# Patient Record
Sex: Male | Born: 1950
Health system: Southern US, Community
[De-identification: ages and names within clinical notes are randomized; demographics above are authoritative.]

## PROBLEM LIST (undated history)

## (undated) DIAGNOSIS — I2510 Atherosclerotic heart disease of native coronary artery without angina pectoris: Secondary | ICD-10-CM

## (undated) DIAGNOSIS — J309 Allergic rhinitis, unspecified: Secondary | ICD-10-CM

## (undated) DIAGNOSIS — I219 Acute myocardial infarction, unspecified: Secondary | ICD-10-CM

## (undated) DIAGNOSIS — K219 Gastro-esophageal reflux disease without esophagitis: Secondary | ICD-10-CM

## (undated) DIAGNOSIS — N529 Male erectile dysfunction, unspecified: Secondary | ICD-10-CM

## (undated) DIAGNOSIS — J45909 Unspecified asthma, uncomplicated: Secondary | ICD-10-CM

## (undated) DIAGNOSIS — K635 Polyp of colon: Secondary | ICD-10-CM

## (undated) DIAGNOSIS — J302 Other seasonal allergic rhinitis: Secondary | ICD-10-CM

## (undated) DIAGNOSIS — N189 Chronic kidney disease, unspecified: Secondary | ICD-10-CM

## (undated) DIAGNOSIS — J189 Pneumonia, unspecified organism: Secondary | ICD-10-CM

## (undated) DIAGNOSIS — I823 Embolism and thrombosis of renal vein: Secondary | ICD-10-CM

## (undated) DIAGNOSIS — E119 Type 2 diabetes mellitus without complications: Secondary | ICD-10-CM

## (undated) DIAGNOSIS — M1990 Unspecified osteoarthritis, unspecified site: Secondary | ICD-10-CM

## (undated) DIAGNOSIS — I10 Essential (primary) hypertension: Secondary | ICD-10-CM

## (undated) DIAGNOSIS — E785 Hyperlipidemia, unspecified: Secondary | ICD-10-CM

## (undated) HISTORY — DX: Hyperlipidemia, unspecified: E78.5

## (undated) HISTORY — PX: CATARACT EXTRACTION: SUR2

## (undated) HISTORY — PX: CORONARY ANGIOPLASTY WITH STENT PLACEMENT: SHX49

## (undated) HISTORY — DX: Male erectile dysfunction, unspecified: N52.9

## (undated) HISTORY — DX: Chronic kidney disease, unspecified: N18.9

## (undated) HISTORY — DX: Unspecified asthma, uncomplicated: J45.909

## (undated) HISTORY — DX: Essential (primary) hypertension: I10

## (undated) HISTORY — DX: Polyp of colon: K63.5

## (undated) HISTORY — DX: Atherosclerotic heart disease of native coronary artery without angina pectoris: I25.10

## (undated) HISTORY — DX: Type 2 diabetes mellitus without complications: E11.9

---

## 2007-05-20 ENCOUNTER — Encounter: Payer: Self-pay | Admitting: Family Medicine

## 2008-06-01 ENCOUNTER — Ambulatory Visit: Payer: Self-pay | Admitting: Family Medicine

## 2008-06-01 LAB — CONVERTED CEMR LAB
Bilirubin Urine: NEGATIVE
Glucose, Urine, Semiquant: NEGATIVE
Ketones, urine, test strip: NEGATIVE
Specific Gravity, Urine: 1.025
Urobilinogen, UA: 0.2
pH: 5.5

## 2008-06-05 LAB — CONVERTED CEMR LAB
AST: 26 units/L (ref 0–37)
Albumin: 3.8 g/dL (ref 3.5–5.2)
Alkaline Phosphatase: 51 units/L (ref 39–117)
Basophils Absolute: 0 10*3/uL (ref 0.0–0.1)
Basophils Relative: 0.7 % (ref 0.0–3.0)
Cholesterol: 130 mg/dL (ref 0–200)
Creatinine,U: 87.2 mg/dL
Eosinophils Absolute: 0.1 10*3/uL (ref 0.0–0.7)
GFR calc non Af Amer: 122.66 mL/min (ref >60)
HCT: 42.7 % (ref 39.0–52.0)
Hgb A1c MFr Bld: 8.2 % — ABNORMAL HIGH (ref 4.6–6.5)
LDL Cholesterol: 73 mg/dL (ref 0–99)
Lymphocytes Relative: 36.7 % (ref 12.0–46.0)
Lymphs Abs: 2.3 10*3/uL (ref 0.7–4.0)
MCHC: 34.8 g/dL (ref 30.0–36.0)
Microalb, Ur: 0.9 mg/dL (ref 0.0–1.9)
Monocytes Absolute: 0.6 10*3/uL (ref 0.1–1.0)
Neutro Abs: 3.3 10*3/uL (ref 1.4–7.7)
Neutrophils Relative %: 50.3 % (ref 43.0–77.0)
PSA: 1.12 ng/mL (ref 0.10–4.00)
Platelets: 182 10*3/uL (ref 150.0–400.0)
RDW: 12.4 % (ref 11.5–14.6)
Sodium: 142 meq/L (ref 135–145)
TSH: 0.95 microintl units/mL (ref 0.35–5.50)
Total CHOL/HDL Ratio: 4
Triglycerides: 111 mg/dL (ref 0.0–149.0)
VLDL: 22.2 mg/dL (ref 0.0–40.0)

## 2008-06-08 ENCOUNTER — Ambulatory Visit: Payer: Self-pay | Admitting: Family Medicine

## 2008-06-08 DIAGNOSIS — J45909 Unspecified asthma, uncomplicated: Secondary | ICD-10-CM

## 2008-06-08 DIAGNOSIS — E119 Type 2 diabetes mellitus without complications: Secondary | ICD-10-CM

## 2008-06-08 DIAGNOSIS — E785 Hyperlipidemia, unspecified: Secondary | ICD-10-CM

## 2008-06-08 DIAGNOSIS — I10 Essential (primary) hypertension: Secondary | ICD-10-CM

## 2008-06-08 HISTORY — DX: Essential (primary) hypertension: I10

## 2008-06-08 HISTORY — DX: Hyperlipidemia, unspecified: E78.5

## 2008-06-08 HISTORY — DX: Unspecified asthma, uncomplicated: J45.909

## 2008-06-08 HISTORY — DX: Type 2 diabetes mellitus without complications: E11.9

## 2008-07-05 ENCOUNTER — Ambulatory Visit: Payer: Self-pay | Admitting: Gastroenterology

## 2008-07-09 ENCOUNTER — Encounter: Payer: Self-pay | Admitting: Family Medicine

## 2008-08-21 ENCOUNTER — Ambulatory Visit: Payer: Self-pay | Admitting: Gastroenterology

## 2008-08-21 ENCOUNTER — Encounter: Payer: Self-pay | Admitting: Gastroenterology

## 2008-08-23 ENCOUNTER — Ambulatory Visit (HOSPITAL_COMMUNITY): Admission: RE | Admit: 2008-08-23 | Discharge: 2008-08-23 | Payer: Self-pay | Admitting: Internal Medicine

## 2008-08-23 ENCOUNTER — Telehealth: Payer: Self-pay | Admitting: Gastroenterology

## 2008-08-23 ENCOUNTER — Ambulatory Visit: Payer: Self-pay | Admitting: Internal Medicine

## 2008-08-23 DIAGNOSIS — R5381 Other malaise: Secondary | ICD-10-CM

## 2008-08-23 DIAGNOSIS — R634 Abnormal weight loss: Secondary | ICD-10-CM | POA: Insufficient documentation

## 2008-08-23 DIAGNOSIS — R509 Fever, unspecified: Secondary | ICD-10-CM

## 2008-08-23 DIAGNOSIS — Z860100 Personal history of colon polyps, unspecified: Secondary | ICD-10-CM | POA: Insufficient documentation

## 2008-08-23 DIAGNOSIS — R5383 Other fatigue: Secondary | ICD-10-CM | POA: Insufficient documentation

## 2008-08-23 LAB — CONVERTED CEMR LAB
AST: 24 units/L (ref 0–37)
Alkaline Phosphatase: 66 units/L (ref 39–117)
Bilirubin Urine: NEGATIVE
Calcium: 8.8 mg/dL (ref 8.4–10.5)
Eosinophils Absolute: 0.1 10*3/uL (ref 0.0–0.7)
Eosinophils Relative: 1.2 % (ref 0.0–5.0)
GFR calc non Af Amer: 105.06 mL/min (ref >60)
HCT: 40.1 % (ref 39.0–52.0)
Hemoglobin: 13.8 g/dL (ref 13.0–17.0)
Leukocytes, UA: NEGATIVE
Lymphocytes Relative: 25 % (ref 12.0–46.0)
MCV: 87.4 fL (ref 78.0–100.0)
Monocytes Absolute: 1.3 10*3/uL — ABNORMAL HIGH (ref 0.1–1.0)
Monocytes Relative: 18.8 % — ABNORMAL HIGH (ref 3.0–12.0)
Neutrophils Relative %: 54.3 % (ref 43.0–77.0)
RBC: 4.59 M/uL (ref 4.22–5.81)
Sodium: 138 meq/L (ref 135–145)
Specific Gravity, Urine: >=1.03 (ref 1.000–1.030)
Total Bilirubin: 0.7 mg/dL (ref 0.3–1.2)
Urine Glucose: 500 mg/dL
WBC: 6.8 10*3/uL (ref 4.5–10.5)
pH: 5.5 (ref 5.0–8.0)

## 2008-08-24 ENCOUNTER — Ambulatory Visit: Payer: Self-pay | Admitting: Family Medicine

## 2008-08-24 DIAGNOSIS — J189 Pneumonia, unspecified organism: Secondary | ICD-10-CM

## 2008-08-24 DIAGNOSIS — N529 Male erectile dysfunction, unspecified: Secondary | ICD-10-CM | POA: Insufficient documentation

## 2008-08-24 HISTORY — DX: Male erectile dysfunction, unspecified: N52.9

## 2008-08-25 ENCOUNTER — Encounter: Payer: Self-pay | Admitting: Gastroenterology

## 2008-08-27 ENCOUNTER — Ambulatory Visit: Payer: Self-pay | Admitting: Family Medicine

## 2008-09-07 ENCOUNTER — Ambulatory Visit (HOSPITAL_COMMUNITY): Admission: RE | Admit: 2008-09-07 | Discharge: 2008-09-07 | Payer: Self-pay | Admitting: Family Medicine

## 2008-09-10 ENCOUNTER — Encounter: Payer: Self-pay | Admitting: Family Medicine

## 2009-03-16 HISTORY — PX: BACK SURGERY: SHX140

## 2009-04-01 ENCOUNTER — Encounter: Payer: Self-pay | Admitting: Family Medicine

## 2009-05-22 ENCOUNTER — Encounter: Admission: RE | Admit: 2009-05-22 | Discharge: 2009-05-22 | Payer: Self-pay | Admitting: Sports Medicine

## 2009-06-10 ENCOUNTER — Ambulatory Visit: Payer: Self-pay | Admitting: Family Medicine

## 2009-06-10 LAB — CONVERTED CEMR LAB
Albumin: 4.2 g/dL (ref 3.5–5.2)
BUN: 14 mg/dL (ref 6–23)
Basophils Absolute: 0 10*3/uL (ref 0.0–0.1)
Basophils Relative: 0.3 % (ref 0.0–3.0)
Calcium: 9.5 mg/dL (ref 8.4–10.5)
Creatinine, Ser: 0.8 mg/dL (ref 0.4–1.5)
Eosinophils Absolute: 0.2 10*3/uL (ref 0.0–0.7)
Eosinophils Relative: 3 % (ref 0.0–5.0)
GFR calc non Af Amer: 104.77 mL/min (ref >60)
Glucose, Bld: 123 mg/dL — ABNORMAL HIGH (ref 70–99)
Glucose, Urine, Semiquant: NEGATIVE
HDL: 43 mg/dL (ref >39.00)
Hgb A1c MFr Bld: 7.1 % — ABNORMAL HIGH (ref 4.6–6.5)
LDL Cholesterol: 52 mg/dL (ref 0–99)
Lymphocytes Relative: 33.5 % (ref 12.0–46.0)
Lymphs Abs: 2 10*3/uL (ref 0.7–4.0)
Microalb Creat Ratio: 14.4 mg/g (ref 0.0–30.0)
Microalb, Ur: 2.9 mg/dL — ABNORMAL HIGH (ref 0.0–1.9)
Neutrophils Relative %: 53.6 % (ref 43.0–77.0)
Nitrite: NEGATIVE
Platelets: 189 10*3/uL (ref 150.0–400.0)
Potassium: 5.3 meq/L — ABNORMAL HIGH (ref 3.5–5.1)
RBC: 4.96 M/uL (ref 4.22–5.81)
Sodium: 141 meq/L (ref 135–145)
Specific Gravity, Urine: >=1.03
TSH: 0.41 microintl units/mL (ref 0.35–5.50)
Total CHOL/HDL Ratio: 3
VLDL: 18.2 mg/dL (ref 0.0–40.0)
WBC Urine, dipstick: NEGATIVE
pH: 5.5

## 2009-06-28 ENCOUNTER — Ambulatory Visit: Payer: Self-pay | Admitting: Family Medicine

## 2009-07-24 ENCOUNTER — Ambulatory Visit: Payer: Self-pay | Admitting: Family Medicine

## 2009-08-01 ENCOUNTER — Encounter (HOSPITAL_BASED_OUTPATIENT_CLINIC_OR_DEPARTMENT_OTHER): Admission: RE | Admit: 2009-08-01 | Discharge: 2009-10-09 | Payer: Self-pay | Admitting: Internal Medicine

## 2009-11-04 ENCOUNTER — Ambulatory Visit: Payer: Self-pay | Admitting: Family Medicine

## 2009-11-04 DIAGNOSIS — J209 Acute bronchitis, unspecified: Secondary | ICD-10-CM

## 2010-04-06 ENCOUNTER — Encounter: Payer: Self-pay | Admitting: Sports Medicine

## 2010-04-15 NOTE — Letter (Signed)
Summary: Joel Williams at [facility] at [facility]   Imported By: Maryln Gottron 04/15/2009 15:22:43  _____________________________________________________________________  External Attachment:    Type:   Image     Comment:   External Document

## 2010-04-15 NOTE — Assessment & Plan Note (Signed)
Summary: CPX // RS/pt rescd//ccm   Vital Signs:  Patient profile:   60 year old male Height:      70.5 inches Weight:      252 pounds BMI:     35.78 Temp:     98.2 degrees F oral Pulse rate:   72 / minute Pulse rhythm:   regular Resp:     12 per minute BP sitting:   130 / 70  (left arm) Cuff size:   large  Vitals Entered By: Sid Falcon LPN (June 28, 2009 9:16 AM)  Nutrition Counseling: Patient's BMI is greater than 25 and therefore counseled on weight management options. CC: CPX   History of Present Illness: Patient here for complete physical examination.  Chronic medical problems include type 2 diabetes, hypertension, hyperlipidemia. Patient sees endocrinologist. On insulin pump. Blood sugars well controlled.  no hyperglycemia.  Recent problems with low back pain. Planned surgery this coming Monday. No cardiac history. Denies any recent chest pains or shortness of breath. Ex smoker. Has had substantial weight gain past few years.  Colonoscopy in 2010. Pneumovax 2007. Tetanus 2007. Next colonoscopy due 2013.  Allergies (verified): No Known Drug Allergies  Past History:  Past Medical History: Last updated: 08/23/2008 Asthma Diabetes mellitus, type II Hypertension Emphysema or chronic Bronchitis  Kidney disease (remote hx ? thrombosis renal vessel) Hyperlipidemia Hemorrhoids Bulging disks-L5 COLON POLYPS/ADENOMATOUS  Past Surgical History: Last updated: 07/05/2008 Unremarkable  Family History: Last updated: 07/05/2008 No FH of Colon Cancer: Family History of Diabetes: Father Family History of Heart Disease: Father  Social History: Last updated: 07/05/2008 Occupation:  Transport planner Married, 2 girls Alcohol use-yes, 4 oz vodka daily,  Former Smoker Quit 5 years ago Daily Caffeine Use 10+cups Illicit Drug Use - no  Risk Factors: Smoking Status: quit (06/08/2008)  Review of Systems       The patient complains of weight gain.  The patient denies  anorexia, fever, weight loss, vision loss, decreased hearing, hoarseness, chest pain, syncope, dyspnea on exertion, peripheral edema, prolonged cough, headaches, hemoptysis, abdominal pain, melena, hematochezia, severe indigestion/heartburn, hematuria, incontinence, genital sores, muscle weakness, suspicious skin lesions, transient blindness, difficulty walking, depression, unusual weight change, abnormal bleeding, enlarged lymph nodes, angioedema, and testicular masses.    Physical Exam  General:  moderately obese in no distress Head:  Normocephalic and atraumatic without obvious abnormalities. No apparent alopecia or balding. Eyes:  pupils equal, pupils round, and pupils reactive to light.   Ears:  External ear exam shows no significant lesions or deformities.  Otoscopic examination reveals clear canals, tympanic membranes are intact bilaterally without bulging, retraction, inflammation or discharge. Hearing is grossly normal bilaterally. Nose:  External nasal examination shows no deformity or inflammation. Nasal mucosa are pink and moist without lesions or exudates. Mouth:  Oral mucosa and oropharynx without lesions or exudates.  Teeth in good repair. Neck:  No deformities, masses, or tenderness noted. Lungs:  Normal respiratory effort, chest expands symmetrically. Lungs are clear to auscultation, no crackles or wheezes. Heart:  Normal rate and regular rhythm. S1 and S2 normal without gallop, murmur, click, rub or other extra sounds. Abdomen:  soft, non-tender, and no masses.   Rectal:  deferred.  pt unable to lean over sec to severe back pain. Extremities:  No clubbing, cyanosis, edema, or deformity noted with normal full range of motion of all joints.   Neurologic:  alert & oriented X3, cranial nerves II-XII intact, strength normal in all extremities, and gait normal.   Skin:  Intact without suspicious lesions or rashes Cervical Nodes:  No lymphadenopathy noted Psych:  normally interactive,  good eye contact, not anxious appearing, and not depressed appearing.     Impression & Recommendations:  Problem # 1:  PHYSICAL EXAMINATION (ICD-V70.0)  labs reviewed with patient. Blood sugars fairly well controlled. Lipids very well controlled. Tetanus and Pneumovax up-to-date. Repeat colonoscopy in 2 years. Patient needs to lose weight and after his back surgery plans to start regular exercise.  Get baseline EKG-NSR and no acute changes.  Orders: EKG w/ Interpretation (93000)  Complete Medication List: 1)  Simvastatin 20 Mg Tabs (Simvastatin) .... Once daily 2)  Aspirin Adult Low Strength 81 Mg Tbec (Aspirin) .... Qd 3)  Metformin Hcl 500 Mg Tabs (Metformin hcl) .... 2 in am, 2 in pm 4)  Cal-day 1000 Tabs (Multiple vitamins-minerals) .... Once daily 5)  Fish Oil Concentrate 1000 Mg Caps (Omega-3 fatty acids) .... Once daily 6)  Cvs Daily Multiple For Men 50+ Tabs (Multiple vitamins-minerals) .... Once daily 7)  Vitamin C 1000 Mg Tabs (Ascorbic acid) .... Take 1 tablet by mouth once a day 8)  Glucosamine 1500 Complex Caps (Glucosamine-chondroit-vit c-mn) .... Once daily.qdtab 9)  Hydrocodone-acetaminophen 5-500 Mg Tabs (Hydrocodone-acetaminophen) .Marland Kitchen.. 1-2 tabs every 4-6 hours as needed pain 10)  Lisinopril 10 Mg Tabs (Lisinopril) .... One by mouth once daily 11)  Bd Ultra-fine 33 Lancets Misc (Lancets) 12)  Bd U/f Short Pen Needle 31g X 8 Mm Misc (Insulin pen needle) .... Bs check two times a day 13)  Cialis 20 Mg Tabs (Tadalafil) .... One by mouth every other day as needed 14)  Airborne  .... As needed 15)  Novolog 100 Unit/ml Soln (Insulin aspart) .... Insulin pump  Patient Instructions: 1)  It is important that you exercise reguarly at least 20 minutes 5 times a week. If you develop chest pain, have severe difficulty breathing, or feel very tired, stop exercising immediately and seek medical attention.  2)  You need to lose weight. Consider a lower calorie diet and regular  exercise.  Prescriptions: CIALIS 20 MG TABS (TADALAFIL) one by mouth every other day as needed  #18 x 3   Entered by:   Sid Falcon LPN   Authorized by:   Evelena Peat MD   Signed by:   Sid Falcon LPN on 16/12/9602   Method used:   Electronically to        [facility]* (mail-order)             ,          Ph: [phone]       Fax: [phone]   RxID:   [id number] LISINOPRIL 10 MG TABS (LISINOPRIL) one by mouth once daily  #90 x 3   Entered by:   Sid Falcon LPN   Authorized by:   Evelena Peat MD   Signed by:   Sid Falcon LPN on 32/44/0102   Method used:   Electronically to        [facility]* (mail-order)             ,          Ph: [phone]       Fax: [phone]   RxID:   [id number] METFORMIN HCL 500 MG TABS (METFORMIN HCL) 2 in am, 2 in pm  #360 x 3   Entered by:   Sid Falcon LPN   Authorized by:   Evelena Peat MD   Signed by:   Harriett Sine  Neckers LPN on 96/06/5407   Method used:   Electronically to        [facility]* (mail-order)             ,          Ph: [phone]       Fax: [phone]   RxID:   [id number] SIMVASTATIN 20 MG TABS (SIMVASTATIN) once daily  #90 x 3   Entered by:   Sid Falcon LPN   Authorized by:   Evelena Peat MD   Signed by:   Sid Falcon LPN on 03/18/7251   Method used:   Electronically to        [facility]* (mail-order)             ,          Ph: [phone]       Fax: [phone]   RxID:   [id number]

## 2010-04-15 NOTE — Assessment & Plan Note (Signed)
Summary: cough/chest congestion/headache/cjr   Vital Signs:  Patient profile:   60 year old male Temp:     98.4 degrees F oral BP sitting:   140 / 80  (left arm) Cuff size:   large  Vitals Entered By: Sid Falcon LPN (Jul 24, 2009 9:50 AM) CC: productive cough, songestion X 3 days   History of Present Illness: Patient seen with acute illness 3 days duration. Symptoms include sore throat, productive cough, malaise, and body aches. No dyspnea. Denies fever or chills. No nausea, vomiting, or diarrhea.  Had low back surgery about 3 weeks ago. Postop follow up with surgeon this past Monday with  operative site infection with some cellulitis. Patient started on Keflex at that time. Back pain is improving. Minimal drainage from wound.  Allergies (verified): No Known Drug Allergies  Past History:  Past Medical History: Last updated: 08/23/2008 Asthma Diabetes mellitus, type II Hypertension Emphysema or chronic Bronchitis  Kidney disease (remote hx ? thrombosis renal vessel) Hyperlipidemia Hemorrhoids Bulging disks-L5 COLON POLYPS/ADENOMATOUS PMH reviewed for relevance  Review of Systems      See HPI  Physical Exam  General:  Well-developed,well-nourished,in no acute distress; alert,appropriate and cooperative throughout examination Ears:  moderate cerumen in both canals otherwise clear Nose:  External nasal examination shows no deformity or inflammation. Nasal mucosa are pink and moist without lesions or exudates. Mouth:  couple small aphthous ulcers otherwise clear Neck:  No deformities, masses, or tenderness noted. Lungs:  Normal respiratory effort, chest expands symmetrically. Lungs are clear to auscultation, no crackles or wheezes. Heart:  Normal rate and regular rhythm. S1 and S2 normal without gallop, murmur, click, rub or other extra sounds. Skin:  low back midline incision site with minimal purulent drainage.  No surrounding cellulitis.   Impression &  Recommendations:  Problem # 1:  VIRAL URI (ICD-465.9) Assessment New Try mucinex and plenty of fluids. His updated medication list for this problem includes:    Aspirin Adult Low Strength 81 Mg Tbec (Aspirin) ..... Qd  Complete Medication List: 1)  Simvastatin 20 Mg Tabs (Simvastatin) .... Once daily 2)  Aspirin Adult Low Strength 81 Mg Tbec (Aspirin) .... Qd 3)  Metformin Hcl 500 Mg Tabs (Metformin hcl) .... 2 in am, 2 in pm 4)  Cal-day 1000 Tabs (Multiple vitamins-minerals) .... Once daily 5)  Fish Oil Concentrate 1000 Mg Caps (Omega-3 fatty acids) .... Once daily 6)  Cvs Daily Multiple For Men 50+ Tabs (Multiple vitamins-minerals) .... Once daily 7)  Vitamin C 1000 Mg Tabs (Ascorbic acid) .... Take 1 tablet by mouth once a day 8)  Glucosamine 1500 Complex Caps (Glucosamine-chondroit-vit c-mn) .... Once daily.qdtab 9)  Hydrocodone-acetaminophen 5-500 Mg Tabs (Hydrocodone-acetaminophen) .Marland Kitchen.. 1-2 tabs every 4-6 hours as needed pain 10)  Lisinopril 10 Mg Tabs (Lisinopril) .... One by mouth once daily 11)  Bd Ultra-fine 33 Lancets Misc (Lancets) 12)  Bd U/f Short Pen Needle 31g X 8 Mm Misc (Insulin pen needle) .... Bs check two times a day 13)  Cialis 20 Mg Tabs (Tadalafil) .... One by mouth every other day as needed 14)  Airborne  .... As needed 15)  Novolog 100 Unit/ml Soln (Insulin aspart) .... Insulin pump  Patient Instructions: 1)  Get plenty of rest, drink lots of clear liquids, and use Tylenol or Ibuprofen for fever and comfort. Return in 7-10 days if you're not better: sooner if you'er feeling worse.  2)  Try Mucinex 2 tablets twice daily to loosen cough. 3)  Follow up  immediately if you note any fevers.

## 2010-04-15 NOTE — Assessment & Plan Note (Signed)
Summary: ? virus//ccm   Vital Signs:  Patient profile:   60 year old male Temp:     98.1 degrees F oral BP sitting:   160 / 90  (left arm) Cuff size:   large  Vitals Entered By: Sid Falcon LPN (November 04, 2009 2:56 PM)  History of Present Illness: Patient seen with one week history of illness. Cough productive of colored mucus. Has felt feverish off and on but no true fever. Has had increased malaise. Minimal nasal congestion. Patient has past history of smoking also type 2 diabetes on insulin pump. CBGs have been stable.  Allergies (verified): No Known Drug Allergies  Past History:  Past Medical History: Last updated: 08/23/2008 Asthma Diabetes mellitus, type II Hypertension Emphysema or chronic Bronchitis  Kidney disease (remote hx ? thrombosis renal vessel) Hyperlipidemia Hemorrhoids Bulging disks-L5 COLON POLYPS/ADENOMATOUS  Physical Exam  General:  Well-developed,well-nourished,in no acute distress; alert,appropriate and cooperative throughout examination Ears:  External ear exam shows no significant lesions or deformities.  Otoscopic examination reveals clear canals, tympanic membranes are intact bilaterally without bulging, retraction, inflammation or discharge. Hearing is grossly normal bilaterally. Nose:  External nasal examination shows no deformity or inflammation. Nasal mucosa are pink and moist without lesions or exudates. Mouth:  Oral mucosa and oropharynx without lesions or exudates.  Teeth in good repair. Neck:  No deformities, masses, or tenderness noted. Lungs:  Normal respiratory effort, chest expands symmetrically. Lungs are clear to auscultation, no crackles or wheezes. Heart:  Normal rate and regular rhythm. S1 and S2 normal without gallop, murmur, click, rub or other extra sounds.   Impression & Recommendations:  Problem # 1:  ACUTE BRONCHITIS (ICD-466.0)  His updated medication list for this problem includes:    Azithromycin 250 Mg Tabs  (Azithromycin) .Marland Kitchen... 2 by mouth today then one by mouth once daily for 4 days  Complete Medication List: 1)  Simvastatin 20 Mg Tabs (Simvastatin) .... Once daily 2)  Aspirin Adult Low Strength 81 Mg Tbec (Aspirin) .... Qd 3)  Metformin Hcl 500 Mg Tabs (Metformin hcl) .... 2 in am, 2 in pm 4)  Cal-day 1000 Tabs (Multiple vitamins-minerals) .... Once daily 5)  Fish Oil Concentrate 1000 Mg Caps (Omega-3 fatty acids) .... Once daily 6)  Cvs Daily Multiple For Men 50+ Tabs (Multiple vitamins-minerals) .... Once daily 7)  Vitamin C 1000 Mg Tabs (Ascorbic acid) .... Take 1 tablet by mouth once a day 8)  Glucosamine 1500 Complex Caps (Glucosamine-chondroit-vit c-mn) .... Once daily.qdtab 9)  Hydrocodone-acetaminophen 5-500 Mg Tabs (Hydrocodone-acetaminophen) .Marland Kitchen.. 1-2 tabs every 4-6 hours as needed pain 10)  Lisinopril 10 Mg Tabs (Lisinopril) .... One by mouth once daily 11)  Bd Ultra-fine 33 Lancets Misc (Lancets) 12)  Bd U/f Short Pen Needle 31g X 8 Mm Misc (Insulin pen needle) .... Bs check two times a day 13)  Cialis 20 Mg Tabs (Tadalafil) .... One by mouth every other day as needed 14)  Airborne  .... As needed 15)  Novolog 100 Unit/ml Soln (Insulin aspart) .... Insulin pump 16)  Azithromycin 250 Mg Tabs (Azithromycin) .... 2 by mouth today then one by mouth once daily for 4 days  Patient Instructions: 1)  Acute Bronchitis symptoms for less then 10 days are not  helped by antibiotics. Take over the counter cough medications. Call if no improvement in 5-7 days, sooner if increasing cough, fever, or new symptoms ( shortness of breath, chest pain) .  Prescriptions: AZITHROMYCIN 250 MG TABS (AZITHROMYCIN) 2 by mouth today  then one by mouth once daily for 4 days  #6 x 0   Entered and Authorized by:   Evelena Peat MD   Signed by:   Evelena Peat MD on 11/04/2009   Method used:   Electronically to        [facility] [address] (retail)       [address]       [place], [place]  [address]        Ph: [phone] or [phone]       Fax: [phone]   RxID:   [id number]

## 2010-04-16 DIAGNOSIS — I219 Acute myocardial infarction, unspecified: Secondary | ICD-10-CM

## 2010-04-16 HISTORY — DX: Acute myocardial infarction, unspecified: I21.9

## 2010-05-01 ENCOUNTER — Other Ambulatory Visit: Payer: Self-pay | Admitting: Family Medicine

## 2010-05-01 DIAGNOSIS — E119 Type 2 diabetes mellitus without complications: Secondary | ICD-10-CM

## 2010-05-14 ENCOUNTER — Inpatient Hospital Stay (HOSPITAL_COMMUNITY)
Admission: AD | Admit: 2010-05-14 | Discharge: 2010-05-17 | DRG: 853 | Disposition: A | Discharge status: Home / Self Care | Payer: BC Managed Care – PPO | Source: Ambulatory Visit | Attending: Nephrology | Admitting: Nephrology

## 2010-05-14 ENCOUNTER — Inpatient Hospital Stay (HOSPITAL_COMMUNITY): Payer: BC Managed Care – PPO

## 2010-05-14 ENCOUNTER — Telehealth: Payer: Self-pay | Admitting: Family Medicine

## 2010-05-14 DIAGNOSIS — E119 Type 2 diabetes mellitus without complications: Secondary | ICD-10-CM | POA: Diagnosis present

## 2010-05-14 DIAGNOSIS — Z79899 Other long term (current) drug therapy: Secondary | ICD-10-CM

## 2010-05-14 DIAGNOSIS — I214 Non-ST elevation (NSTEMI) myocardial infarction: Principal | ICD-10-CM | POA: Diagnosis present

## 2010-05-14 DIAGNOSIS — Z9641 Presence of insulin pump (external) (internal): Secondary | ICD-10-CM

## 2010-05-14 DIAGNOSIS — I2510 Atherosclerotic heart disease of native coronary artery without angina pectoris: Secondary | ICD-10-CM | POA: Diagnosis present

## 2010-05-14 DIAGNOSIS — Z7982 Long term (current) use of aspirin: Secondary | ICD-10-CM

## 2010-05-14 DIAGNOSIS — I10 Essential (primary) hypertension: Secondary | ICD-10-CM | POA: Diagnosis present

## 2010-05-14 DIAGNOSIS — Z8249 Family history of ischemic heart disease and other diseases of the circulatory system: Secondary | ICD-10-CM

## 2010-05-14 DIAGNOSIS — R0789 Other chest pain: Secondary | ICD-10-CM

## 2010-05-14 DIAGNOSIS — Z794 Long term (current) use of insulin: Secondary | ICD-10-CM

## 2010-05-14 DIAGNOSIS — J45909 Unspecified asthma, uncomplicated: Secondary | ICD-10-CM | POA: Diagnosis present

## 2010-05-14 DIAGNOSIS — F1010 Alcohol abuse, uncomplicated: Secondary | ICD-10-CM | POA: Diagnosis present

## 2010-05-14 DIAGNOSIS — Z87891 Personal history of nicotine dependence: Secondary | ICD-10-CM

## 2010-05-14 DIAGNOSIS — E785 Hyperlipidemia, unspecified: Secondary | ICD-10-CM | POA: Diagnosis present

## 2010-05-14 DIAGNOSIS — Z7902 Long term (current) use of antithrombotics/antiplatelets: Secondary | ICD-10-CM

## 2010-05-14 LAB — CARDIAC PANEL(CRET KIN+CKTOT+MB+TROPI)
CK, MB: 3.1 ng/mL (ref 0.3–4.0)
Relative Index: 1.8 (ref 0.0–2.5)
Total CK: 177 U/L (ref 7–232)
Troponin I: 0.03 ng/mL (ref 0.00–0.06)

## 2010-05-14 LAB — D-DIMER, QUANTITATIVE: D-Dimer, Quant: 0.26 ug/mL-FEU (ref 0.00–0.48)

## 2010-05-14 NOTE — Telephone Encounter (Signed)
Pt  Developed chest pain earlier today at work.  Went to emergency room at [facility] in [place]. Initial cardiac enzymes negative. Nonspecific EKG changes. Patient pain-free at this time. Recommendation for admission for rule out MI and further evaluation with concern for possible unstable angina. Patient request transferred to [facility].  spoke with the hospitalist with [facility] and patient will be transferred here in a pain-free direct admission to telemetry. If any recurrent pain in route they will go through emergency department

## 2010-05-15 ENCOUNTER — Inpatient Hospital Stay (HOSPITAL_COMMUNITY): Payer: BC Managed Care – PPO

## 2010-05-15 DIAGNOSIS — R079 Chest pain, unspecified: Secondary | ICD-10-CM

## 2010-05-15 LAB — HEMOGLOBIN A1C: Hgb A1c MFr Bld: 8.1 % — ABNORMAL HIGH (ref <5.7)

## 2010-05-15 LAB — CBC
MCHC: 34.1 g/dL (ref 30.0–36.0)
MCV: 85.6 fL (ref 78.0–100.0)
Platelets: 154 10*3/uL (ref 150–400)
RBC: 4.73 MIL/uL (ref 4.22–5.81)
RDW: 13 % (ref 11.5–15.5)

## 2010-05-15 LAB — GLUCOSE, CAPILLARY
Glucose-Capillary: 164 mg/dL — ABNORMAL HIGH (ref 70–99)
Glucose-Capillary: 157 mg/dL — ABNORMAL HIGH (ref 70–99)
Glucose-Capillary: 218 mg/dL — ABNORMAL HIGH (ref 70–99)

## 2010-05-15 LAB — CARDIAC PANEL(CRET KIN+CKTOT+MB+TROPI)
Relative Index: 1.5 (ref 0.0–2.5)
Troponin I: 0.04 ng/mL (ref 0.00–0.06)

## 2010-05-15 LAB — BASIC METABOLIC PANEL
Calcium: 9 mg/dL (ref 8.4–10.5)
Chloride: 101 mEq/L (ref 96–112)
GFR calc Af Amer: >60 mL/min (ref >60)
GFR calc non Af Amer: >60 mL/min (ref >60)

## 2010-05-15 LAB — LIPID PANEL
LDL Cholesterol: 45 mg/dL (ref 0–99)
Total CHOL/HDL Ratio: 3.5 RATIO
Triglycerides: 172 mg/dL — ABNORMAL HIGH (ref <150)
VLDL: 34 mg/dL (ref 0–40)

## 2010-05-15 LAB — PROTIME-INR: INR: 1.05 (ref 0.00–1.49)

## 2010-05-15 MED ORDER — TECHNETIUM TC 99M TETROFOSMIN IV KIT
10.0000 | PACK | Freq: Once | INTRAVENOUS | Status: AC | PRN
  Administered 2010-05-15: 10 via INTRAVENOUS

## 2010-05-15 MED ORDER — TECHNETIUM TC 99M TETROFOSMIN IV KIT
30.0000 | PACK | Freq: Once | INTRAVENOUS | Status: AC | PRN
  Administered 2010-05-15: 30 via INTRAVENOUS

## 2010-05-15 NOTE — H&P (Signed)
Joel Williams, ELMS NO.:  [id number]  MEDICAL RECORD NO.:  [id number]           PATIENT TYPE:  I  LOCATION:  3703                         FACILITY:  [facility]  PHYSICIAN:  Jeoffrey Massed, MD    DATE OF BIRTH:  02/13/1951  DATE OF ADMISSION:  05/14/2010 DATE OF DISCHARGE:                             HISTORY & PHYSICAL   CHIEF COMPLAINT:  Chest pain.  HISTORY OF PRESENT ILLNESS:  The patient is a very pleasant 60 year old white male, who was an ex-smoker, with a past medical history of diabetes on an insulin pump, hypertension, dyslipidemia, claims that his father had coronary artery disease/angina and presented to the hospital because of chest pain.  Per this patient, he woke up early this morning with chest pain.  The pain apparently was sharp in nature around 8/10 without any associated nausea, vomiting, or diaphoresis.  He did have some intermittent palpitations.  Per patient, the pain reoccurred multiple times in the morning.  Pain lasted around may be 10-15 seconds. There was no radiation of the pain to his left shoulder or his neck; however, he claims that he did have pain in his left elbow region when he had chest pain, however.  Since around 9 or 10 this morning, the patient has been chest pain-free.  He actually went to work and was seen by a nurse at his work and was given aspirin and then was actually transferred to the [facility].  Since this patient lives in [place], he requested transfer to [facility] rather.  Patient denies any headache.  Denies any shortness of breath. Denies any visual symptoms.  Denies any abdominal pain.  Denies any leg pain.  ALLERGIES:  None.  PAST MEDICAL HISTORY: 1. Diabetes. 2. Dyslipidemia. 3. Hypertension. 4. Questionable history of a clot in his left kidney number of years     ago.  The patient claims that he was never put on anticoagulation     for that.  PAST SURGICAL  HISTORY:  He had spine surgery for a prolapsed disk.  MEDICATIONS:  At home include the following, 1. Insulin pump with a basal rate of 1.8 units an hour.  The patient     boluses himself around 3-11.5 units three to four times a day     depending on his meals. 2. Metformin 500 mg 2 tablets p.o. b.i.d. 3. Lisinopril 10 mg 1 tablet daily. 4. Simvastatin 20 mg 1 tablet daily. 5. Vitamin D 2000 international units for bone health daily. 6. Fish oil 1000 mg daily. 7. Vitamin E 400 international units daily. 8. Vitamin C 1000 mg 1 tablet b.i.d.  FAMILY HISTORY:  Claims his father had angina as well.  He is not sure whether this required coronary artery bypass grafting or stenting.  SOCIAL HISTORY:  The patient continues to work.  He basically has a best job and he quit smoking around 8 years ago.  He used to be a heavy smoker and smoked for more than 20 years.  He does drink 1 or 2 drinks of liquor on a regular basis.  He denies any other toxic habits.  REVIEW OF SYSTEMS:  A detailed review of 12 systems were done and these are negative except for the ones mentioned in the HPI.  PHYSICAL EXAM:  VITAL SIGNS:  Initial vital signs here in the hospital shows a temperature of 98.2, pulse of 80, blood pressure 139/83, respiration of 20, and a pulse oximetry of 95% on room air. GENERAL EXAM:  An elderly white male, lying in bed, does not appear to be in any distress. HEENT:  Atraumatic, normocephalic.  Pupils equally react to light and accommodation. MOUTH:  Oral mucosa is moist.  Posterior pharyngeal wall does not show any erythema or congestion or exudates.  Tonsils are not enlarged. NECK:  There is no JVD. CHEST:  Bilaterally clear to auscultation. CARDIOVASCULAR:  Heart sounds are regular.  No murmurs heard. ABDOMEN:  Soft, nontender, and nondistended. SKIN:  There is no rash. EXTREMITIES:  There is no peripheral edema.  He has no cyanosis or clubbing.  Both of his lower extremities  are warm to touch and there is 2+ pedal pulses bilaterally. NEUROLOGY:  The patient is alert, oriented x3, and has no focal neurological deficits.  LABORATORY DATA: 1. Please note that these lab data are available in the chart and they     are actually done in [facility] earlier     today.  His chemistry shows a sodium of 137, potassium of 4.7,     chloride of 101, bicarb of 28, BUN of 16, creatinine of 0.70, and     glucose of 170.  The CK-MB is 1.2 and myoglobin is 57.4. 2. CBC shows a WBC of 5.8, hemoglobin of 14.8, hematocrit of 44.8, and     platelet count of 169.  The 12-lead EKG shows a normal sinus rhythm with some nonspecific ST changes. Radiological studies, none available so far.  ASSESSMENT: 1. Chest pain, rule out acute coronary syndrome. 2. History of diabetes, on an insulin pump. 3. Hypertension. 4. Dyslipidemia.  PLAN: 1. The patient will be admitted to Telemetry Unit.  His cardiac     enzymes will be cycled. 2. Given that the patient has numerous risk factors, we will consult     Cardiology for an inpatient stress test as well. 3. We will resume his lisinopril and continue his insulin pump for     now.  His metformin will be placed on hold for now. 4. This patient will be monitored closely and his clinical course will     be monitored very closely.  Further plan will depend as the     patient's clinical course evolves and further laboratory data is     available as well as this     Cardiology consultation obtained. 5. DVT prophylaxis with Lovenox. 6. Code status.  The patient is a full code.     Jeoffrey Massed, MD     SG/MEDQ  D:  05/14/2010  T:  05/14/2010  Job:  [id number]  cc:   Tonita Cong, M.D. Evelena Peat, M.D.  Electronically Signed by Jeoffrey Massed  on 05/15/2010 03:58:04 PM

## 2010-05-16 ENCOUNTER — Inpatient Hospital Stay (HOSPITAL_COMMUNITY): Payer: BC Managed Care – PPO

## 2010-05-16 DIAGNOSIS — I2510 Atherosclerotic heart disease of native coronary artery without angina pectoris: Secondary | ICD-10-CM

## 2010-05-16 LAB — BRAIN NATRIURETIC PEPTIDE: Pro B Natriuretic peptide (BNP): 76 pg/mL (ref 0.0–100.0)

## 2010-05-16 LAB — POCT ACTIVATED CLOTTING TIME: Activated Clotting Time: 529 seconds

## 2010-05-16 LAB — GLUCOSE, CAPILLARY: Glucose-Capillary: 187 mg/dL — ABNORMAL HIGH (ref 70–99)

## 2010-05-17 DIAGNOSIS — I200 Unstable angina: Secondary | ICD-10-CM

## 2010-05-17 LAB — BASIC METABOLIC PANEL
Calcium: 9.2 mg/dL (ref 8.4–10.5)
GFR calc Af Amer: >60 mL/min (ref >60)
GFR calc non Af Amer: >60 mL/min (ref >60)
Sodium: 140 mEq/L (ref 135–145)

## 2010-05-17 LAB — CBC
MCHC: 34.3 g/dL (ref 30.0–36.0)
RDW: 13.1 % (ref 11.5–15.5)

## 2010-05-18 NOTE — Discharge Summary (Signed)
Joel Williams, [facility] NO.:  [id number]  MEDICAL RECORD NO.:  [id number]           PATIENT TYPE:  I  LOCATION:  6529                         FACILITY:  [facility]  PHYSICIAN:  Celso Amy, MD   DATE OF BIRTH:  March 09, 1951  DATE OF ADMISSION:  05/14/2010 DATE OF DISCHARGE:  05/17/2010                              DISCHARGE SUMMARY   ADMITTING DIAGNOSES: 1. Chest pain and, rule out acute coronary syndrome. 2. Diabetes mellitus on insulin pump. 3. Hypertension. 4. Dyslipidemia.  DISCHARGE DIAGNOSES: 1. Status post percutaneous coronary intervention, drug-eluting stent     placed in LAD. 2. Status post left ventricular angiography. 3. Hypertension. 4. Diabetes mellitus. 5. Dyslipidemia. 6. Reactive airway disease, questionable.  CONSULTS OBTAINED DURING THE HOSPITAL STAY:  Cardiology.  PROCEDURE PERFORMED DURING THE HOSPITAL STAY:  Left heart catheterization, coronary angiographic, left ventricular angiography, left anterior descending artery angioplasty, and drug-eluting stent placed.  HOSPITAL COURSE AND TREATMENT: 1. CAD.  The patient was admitted with a family history of CAD, had     chest pain.  The patient underwent angiography, which showed that     the patient had severe disease in the left anterior descending     artery and had angioplasty done and a stent placed. 2. CHF.  The patient had a left ventricular angiography, which showed     EF of 50% and moderately elevated left ventricular end-diastolic     pressure. 3. Diabetes mellitus.  The patient has a history of diabetes mellitus,     and the patient's blood glucose was well controlled during the     hospital stay. 4. Hypertension.  The patient's blood pressure was better, but not at     goal during the hospital stay.  The patient's medication was     changed from Coreg to lisinopril plus hydrochlorothiazide because     of his respiratory status. 5. Respiratory.  The patient has possible  reactive airway disease     discovered during the hospital stay as the patient was having short     of breath and was feeling better after albuterol.  DISCHARGE MEDICATIONS: 1. Albuterol 1 puff t.i.d. 2. Aspirin 325 mg p.o. daily. 3. Zithromax 500 mg p.o. daily for next 7 days. 4. Fluticasone nasal spray b.i.d. 5. Lisinopril/hydrochlorothiazide 20/12.5 p.o. daily. 6. Prasugrel 10 mg p.o. daily. 7. Toprol-XL 12.5 mg p.o. daily. 8. Metformin 1000 mg p.o. b.i.d., but the patient is to hold his     medication for next day, as the patient had had IV dye during the     angiography. 9. Fish oil 1 tablet p.o. daily. 10.NovoLog insulin as 40 units subcu at bedtime. 11.Simvastatin 20 mg p.o. daily. 12.The patient takes over-the-counter vitamin C, vitamin E, ICaps.     The patient is to continue that. 13 NTG 0.4mg  SL PRN  FOLLOWUP:  The patient is to follow up with primary care in the next 2-4 weeks.  The patient is to follow up with Cardiology in the next 2-4 weeks.     Celso Amy, MD     MB/MEDQ  D:  05/17/2010  T:  05/18/2010  Job:  [id number]  Electronically Signed by Celso Amy M.D. on 05/18/2010 12:20:54 PM

## 2010-05-22 ENCOUNTER — Encounter: Payer: Self-pay | Admitting: Family Medicine

## 2010-05-22 NOTE — Consult Note (Signed)
Joel Williams, Joel Williams NO.:  [id number]  MEDICAL RECORD NO.:  [id number]           PATIENT TYPE:  I  LOCATION:  3703                         FACILITY:  [facility]  PHYSICIAN:  Vesta Mixer, M.D. DATE OF BIRTH:  [birth year]  DATE OF CONSULTATION:  05/14/2010 DATE OF DISCHARGE:                                CONSULTATION   PRIMARY CARE PHYSICIAN:  Evelena Peat, MD  PRIMARY CARDIOLOGIST:  Venita Lick. Russella Dar, MD, Clementeen Graham  REASON FOR CONSULTATION:  Chest discomfort.  HISTORY OF PRESENT ILLNESS:  Mr. Olarte is a 60 year old Caucasian gentleman with no known history of coronary artery disease, but CAD equivalent of IDDM, and risk factors of hypertension, hyperlipidemia, obesity, and borderline positive family history (father with CAD at age 33), who presents with sharp left-sided substernal chest discomfort lasting up to a minute off and on since about 3 a.m. today in setting of recent increase in activity (lot of yard work yesterday).  The patient has been in his usual state of health and completed quite a bit of yard work yesterday which is not normal for him.  He rode his ride mower, emptied the bag several times, cleaned up a lot of debris in his yard which is much more activity used to doing.  During this activity yesterday, he had no chest pain or shortness of breath.  No dyspnea on exertion.  No palpitations.  No pre/syncope or any other complaints and felt fine actually.  Unfortunately, at approximately 3 a.m. this morning, the patient awoke feeling some sharp left-sided chest discomfort, also noticed some left antecubital fossa sharp discomfort, but not clearly radiating to that area.  He had no associated symptoms nor did he have any clear aggravating or alleviating factors.  The patient was able to go back to sleep, but again today he had several episodes of the same type pain.  At 10 a.m. today at work, he had an especially bad episode while he was  standing at his desk that he says was like an 8/10 in severity and again in the same nature/quality of symptoms.  The patient denied any diaphoresis, nausea or any other complaints as outlined above.  He described his symptoms to a coworker who insisted that he seek evaluation [facility].  The patient was seen there and once symptoms described and they found that his past medical history.  He was sent to [facility] for further evaluation.  I am not sure if the patient had any cardiac enzymes drawn there as they are not in the computer and I do not see any in his chart, but he did have EKG that shows normal sinus rhythm with no acute changes.  Currently, the patient is feeling a little better, but still has had a few episodes since being transferred to Urgent Care to [facility].  Vital signs are within normal limits except for minimally elevated blood pressure 139/83 only one check on telemetry is normal sinus stable.  All labs and chest x-ray are pending.  PAST MEDICAL HISTORY: 1. IDDM. 2. Hypertension. 3. Hyperlipidemia. 4. Morbid obesity. 5. Asthma. 6. Chronic bronchitis. 7.  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 8. Bulging disk L5.     a.     History of back surgery July 01, 2009. 9. Hemorrhoids. 10.History of adenomatous colonic polyps. 11.Remote tobacco abuse (35 pack-year history, quit in 2005).  SOCIAL HISTORY:  The patient lives in [place] with his wife.  He is a Scientific laboratory technician for [employer]  He has a 35+ pack-year smoking history quitting in 2005.  He drinks approximately 4 drinks per day. Occasionally, has binge drinking (once a month or so).  No illicit drug use.  No herbal meds.  Generally, he follows diabetic diet and no regular exercise, but is fairly active in daily life.  FAMILY HISTORY:  The patient's mother living age 38.  No significant medical history.  Father deceased in his 69s first diagnosed with coronary artery  disease at the age of 48.  The patient is unsure whether or not father had myocardial infarction.  The patient has a sister and a brother with no significant medical history.  REVIEW OF SYSTEMS:  Please see HPI.  All other systems reviewed and were negative.  CODE STATUS:  Full.  ALLERGIES:  NKDA.  MEDICATIONS:  Med list pending.  DATA:  VITAL SIGNS:  Temp 98.2 degrees Fahrenheit, BP 139/83, pulse 80, respiration rate 20, O2 saturation 95% on room air and weight 115.9 kg. GENERAL:  The patient is alert and oriented x3, in no apparent distress, able to speak easily in full sentences without any respiratory distress. HEAD:  Normocephalic, atraumatic. EYES:  Pupils equal, round and reactive to light.  Extraocular muscles are intact. NOSE:  Nares are patent without discharge. MOUTH:  Dentition is fair.  Oropharynx without erythema or exudates. NECK:  Supple without lymphadenopathy.  No thyromegaly.  No JVD. HEART:  Rate is regular with audible S1 and S2.  No clicks, rubs, murmurs or gallops. EXTREMITIES:  Pulses 2+ and equal in both upper and lower extremities bilaterally. LUNGS:  Clear to auscultation bilaterally. SKIN:  No rashes, lesions or petechiae. ABDOMEN:  Soft, nontender and nondistended.  Normal abdominal bowel sounds.  No rebound or guarding.  No hepatosplenomegaly.  The patient is morbidly obese. EXTREMITIES:  Without clubbing, cyanosis or edema. MUSCULOSKELETAL:  Without joint deformity or effusions.  No spinal or CVA tenderness. NEURO:  Cranial nerves II through XII are grossly intact.  Strength 5/5 in all extremities and axial groups.  Normal sensation throughout and normal cerebellar function.  RADIOLOGY:  Chest x-ray pending.  EKG:  Normal sinus rhythm rate at 72 bpm with no concerning ST-T wave changes.  No significant Q-waves.  Normal axis.  No evidence of hypertrophy.  PR 103, QRS 90 and QTC of 416.  No prior tracing for comparison.  LABORATORY DATA:   Pending.  The patient initially seen by Lenard Galloway, PA-C and currently being seen by attending cardiologist, Dr. Kristeen Miss.  Please see his addendum for any significant changes.  ASSESSMENT AND PLAN:  Mr. Holness is a 60 year old Caucasian gentleman with no known history of coronary artery disease, but CAD equivalent of IDDM, risk factors of hypertension, hyperlipidemia, morbid obesity, borderline family history, remote tobacco abuse and borderline family history, who presents with mostly atypical chest discomfort in the setting of significant increase in activity prior to the onset of symptoms and as of yet no objective evidence of cardiac etiology.  Chest pain - mostly atypical symptoms with no objective evidence, but high pretest probability given all the comorbidities listed above. Would  rule out with serial cardiac enzymes and if negative we will have an order for a treadmill Myoview to take place in the morning.  The patient has plans on a trip in a few days and would opt for further objective evidence soon rather than later given his plans to leave the area given his travel plans.  EtOH abuse - I discussed the importance of EtOH in moderation at a maximum 2 drinks per day as per [facility] guidelines.  Discussed the option of total cessation should moderation prove difficult.  Thank you for the consult.  We will continue to follow.     Jarrett Ables, PAC   ______________________________ Vesta Mixer, M.D.    MS/MEDQ  D:  05/14/2010  T:  05/15/2010  Job:  [id number]  Electronically Signed by Jarrett Ables PAC on 05/20/2010 04:49:15 PM Electronically Signed by Kristeen Miss M.D. on 05/21/2010 07:03:40 AM

## 2010-05-23 ENCOUNTER — Encounter: Payer: Self-pay | Admitting: Family Medicine

## 2010-05-23 ENCOUNTER — Ambulatory Visit (INDEPENDENT_AMBULATORY_CARE_PROVIDER_SITE_OTHER): Payer: BC Managed Care – PPO | Admitting: Family Medicine

## 2010-05-23 ENCOUNTER — Ambulatory Visit: Payer: BC Managed Care – PPO | Admitting: Family Medicine

## 2010-05-23 DIAGNOSIS — E119 Type 2 diabetes mellitus without complications: Secondary | ICD-10-CM

## 2010-05-23 DIAGNOSIS — I2510 Atherosclerotic heart disease of native coronary artery without angina pectoris: Secondary | ICD-10-CM

## 2010-05-23 DIAGNOSIS — I10 Essential (primary) hypertension: Secondary | ICD-10-CM

## 2010-05-23 DIAGNOSIS — E785 Hyperlipidemia, unspecified: Secondary | ICD-10-CM

## 2010-05-23 NOTE — Patient Instructions (Signed)
Follow up immediately for any recurrent chest pain or increased of breath.

## 2010-05-23 NOTE — Progress Notes (Signed)
  Subjective:    Patient ID: Joel Williams, male    DOB: [birth year], 60 y.o.   MRN: [id number]  HPI  Patient seen for hospital followup. Admitted 29th of February with chest pain. Cardiac enzymes negative. Catheterization revealed LAD lesion and patient received cardiac stent. Done well since then. No recurrent chest pain. He has some chronic dyspnea with exertion which is unchanged. Patient quit smoking about 8 years ago. Lipids have been well controlled. Diabetes suboptimal control. He is followed by endocrinologist.    Patient had lipids in hospital with an LDL  45 and HDL 31. Patient had addition of beta blocker and increased dosage of lisinopril with addition of HCTZ. Compliant with all medications. Taking anti-platelet agent. No bleeding complications. Scheduled to see cardiologist in couple weeks. Inquiring about release back to work. Plans to start cardiac rehabilitation hopefully in a couple of weeks.   Review of Systems  Constitutional: Negative for fever and appetite change.  Respiratory: Positive for shortness of breath. Negative for cough and wheezing.   Cardiovascular: Negative for chest pain, palpitations and leg swelling.  Gastrointestinal: Negative for abdominal pain and blood in stool.  Neurological: Negative for syncope.       Objective:   Physical Exam  Constitutional: He is oriented to person, place, and time.  Neurological: He is alert and oriented to person, place, and time.  Psychiatric: He has a normal mood and affect.    patient is alert in no distress Neck no mass Chest clear to auscultation Heart regular rhythm and rate       Assessment & Plan:   #1 CAD with recent stent LAD lesion currently stable. Plans to start cardiac rehabilitation. Discussed secondary prevention. Needs to lose weight and start regular exercise  #2 type 2 diabetes suboptimally controlled #3 hyperlipidemia-recent LDL at goal. #4 hypertension

## 2010-05-25 ENCOUNTER — Encounter: Payer: Self-pay | Admitting: Family Medicine

## 2010-06-05 ENCOUNTER — Encounter: Payer: Self-pay | Admitting: Physician Assistant

## 2010-06-05 DIAGNOSIS — I2510 Atherosclerotic heart disease of native coronary artery without angina pectoris: Secondary | ICD-10-CM

## 2010-06-09 ENCOUNTER — Encounter (HOSPITAL_COMMUNITY): Payer: BC Managed Care – PPO

## 2010-06-11 ENCOUNTER — Encounter (HOSPITAL_COMMUNITY): Payer: BC Managed Care – PPO

## 2010-06-11 ENCOUNTER — Encounter: Payer: Self-pay | Admitting: Physician Assistant

## 2010-06-11 DIAGNOSIS — I2510 Atherosclerotic heart disease of native coronary artery without angina pectoris: Secondary | ICD-10-CM | POA: Insufficient documentation

## 2010-06-12 ENCOUNTER — Ambulatory Visit (INDEPENDENT_AMBULATORY_CARE_PROVIDER_SITE_OTHER): Payer: BC Managed Care – PPO | Admitting: Physician Assistant

## 2010-06-12 ENCOUNTER — Encounter: Payer: Self-pay | Admitting: Physician Assistant

## 2010-06-12 VITALS — BP 116/78 | HR 77 | Resp 18 | Ht 70.0 in | Wt 246.8 lb

## 2010-06-12 DIAGNOSIS — I2510 Atherosclerotic heart disease of native coronary artery without angina pectoris: Secondary | ICD-10-CM

## 2010-06-12 MED ORDER — METOPROLOL SUCCINATE ER 25 MG PO TB24: 25.0000 mg | ORAL_TABLET | Freq: Every day | ORAL | Status: DC

## 2010-06-12 MED ORDER — PRASUGREL HCL 10 MG PO TABS: 10.0000 mg | ORAL_TABLET | Freq: Every day | ORAL | Status: DC

## 2010-06-12 MED ORDER — LISINOPRIL-HYDROCHLOROTHIAZIDE 20-12.5 MG PO TABS: 1.0000 | ORAL_TABLET | Freq: Every day | ORAL | Status: DC

## 2010-06-12 NOTE — Progress Notes (Signed)
History of Present Illness: Joel Williams is a 60 y.o. male With a history of diabetes, hypertension hyperlipidemia who presented to the hospital on 05/14/10 chest pain.  MI was ruled out.  Myoview study was positive for anteroseptal ischemia and EF of 52%.  Cardiac catheterization demonstrated single vessels CAD with a mid LAD stenosis of 90%.  This was treated with a Promus DES.  He was placed on aspirin and Effient.  He returns for followup.  He is doing well.  He denies any further chest discomfort consistent with his angina.  He denies shortness of breath.  He denies orthopnea or PND.  He denies edema.  He denies syncope.  His right wrist is without pain or swelling.  Past Medical History  Diagnosis Date  . DIABETES MELLITUS, TYPE II 06/08/2008  . HYPERLIPIDEMIA 06/08/2008  . HYPERTENSION 06/08/2008  . ASTHMA 06/08/2008  . Impotence of organic origin 08/24/2008  . Chronic kidney disease   . Hemorrhoids   . Colonic polyp   . CAD (coronary artery disease)     s/p Promus DES (3.0 x 16 mm) to mid LAD 05/16/10;  Myoview 05/15/10: anteroseptal ischemia with EF 52%;  Cath 05/16/10: single vessel CAD with mLAD 90% tx with PCI and EF 50%    Current Outpatient Prescriptions  Medication Sig Dispense Refill  . Ascorbic Acid (VITAMIN C) 100 MG tablet Take 100 mg by mouth daily.        Marland Kitchen aspirin 325 MG tablet Take 325 mg by mouth daily.        . BD ULTRA-FINE LANCETS lancets by Other route 2 (two) times daily. Use as instructed       . Biotin 10 MG TABS Take by mouth.        . Cholecalciferol (VITAMIN D) 2000 UNITS CAPS Take by mouth.        . fish oil-omega-3 fatty acids 1000 MG capsule Take 1 g by mouth daily.       . fluticasone (FLONASE) 50 MCG/ACT nasal spray 2 sprays by Nasal route daily.        . Glucosamine-Chondroit-Vit C-Mn (GLUCOSAMINE 1500 COMPLEX PO) Take by mouth daily.        . insulin aspart (NOVOLOG) 100 UNIT/ML injection Pump 40.2 units per day continuous, basal rate Bolus rate 3.0 to  11.5       . Insulin Pen Needle (B-D ULTRAFINE III SHORT PEN) 31G X 8 MM MISC by Does not apply route 2 (two) times daily at 10 AM and 5 PM.        . lisinopril-hydrochlorothiazide (PRINZIDE,ZESTORETIC) 20-12.5 MG per tablet Take 1 tablet by mouth daily.        . metFORMIN (GLUCOPHAGE) 500 MG tablet TAKE 2 TABLETS IN THE MORNING, 2 TABLETS IN THE EVENING  360 tablet  0  . metoprolol succinate (TOPROL-XL) 25 MG 24 hr tablet       . mometasone (NASONEX) 50 MCG/ACT nasal spray 2 sprays by Nasal route daily.        . Multiple Vitamins-Minerals (ICAPS MV) TABS Take by mouth daily.        . nitroGLYCERIN (NITROSTAT) 0.4 MG SL tablet Place 0.4 mg under the tongue every 5 (five) minutes as needed.        . NON FORMULARY Novolog 100 units/ml solution insulin pump       . prasugrel (EFFIENT) 10 MG TABS Take 10 mg by mouth daily.        . simvastatin (ZOCOR) 20  MG tablet Take 20 mg by mouth at bedtime.        . tadalafil (CIALIS) 20 MG tablet Take 20 mg by mouth. Every other day as needed       . vitamin E 400 UNIT capsule Take 400 Units by mouth daily.        . hydrocodone-acetaminophen (LORCET-HD) 5-500 MG per capsule Take 1 capsule by mouth every 6 (six) hours as needed.        Marland Kitchen lisinopril (PRINIVIL,ZESTRIL) 10 MG tablet       . metoprolol tartrate (LOPRESSOR) 25 MG tablet Take 25 mg by mouth daily. For 30 day, then D/C       . Multiple Vitamins-Minerals (MENS 50+ MULTI VITAMIN/MIN PO) Take by mouth daily.        Marland Kitchen DISCONTD: azithromycin (ZITHROMAX) 250 MG tablet Take 2 tablets by mouth on day 1, followed by 1 tablet by mouth daily for 4 days.        No Known Allergies  Vital Signs: BP 116/78  Pulse 77  Resp 18  Ht 5\' 10"  (1.778 m)  Wt 246 lb 12.8 oz (111.948 kg)  BMI 35.41 kg/m2  PHYSICAL EXAM: Well nourished, well developed, in no acute distress HEENT: normal Neck: no JVD Cardiac:  normal S1, S2; RRR; no murmur Lungs:  clear to auscultation bilaterally, no wheezing, rhonchi or  rales Abd: soft, nontender, no hepatomegaly Ext: no edema; Right radial site without hematoma or bruit Skin: warm and dry Neuro:  CNs 2-12 intact, no focal abnormalities noted  EKG:  Normal sinus rhythm, heart rate 77, normal axis, nonspecific ST-T wave changes  ASSESSMENT AND PLAN:

## 2010-06-12 NOTE — Assessment & Plan Note (Signed)
Managed by PCP

## 2010-06-12 NOTE — Procedures (Signed)
NAME:  Joel Williams, Joel Williams NO.:  [id number]  MEDICAL RECORD NO.:  [id number]           PATIENT TYPE:  I  LOCATION:  6529                         FACILITY:  [facility]  PHYSICIAN:  Lorine Bears, MD     DATE OF BIRTH:  [birth year]  DATE OF PROCEDURE: DATE OF DISCHARGE:                           CARDIAC CATHETERIZATION   REFERRING PHYSICIAN:  Vesta Mixer, MD  PROCEDURES PERFORMED: 1. Left heart catheterization 2. Coronary angiography. 3. Left ventricular angiography. 4. Left anterior descending artery angioplasty and drug-eluting stent     placement to the mid segment.  INDICATION AND CLINICAL HISTORY:  Mr. Captain is a 60 year old gentleman with no previous cardiac history.  He has multiple risk factors for coronary artery disease that include type 2 diabetes, hypertension, and hyperlipidemia as well as remote history of smoking. He presented with symptoms of chest pain.  He had some inferior and lateral ST changes.  Ultimately, his troponin came back slightly elevated suggestive of a small non-ST-elevation myocardial infarction. Due to that, a cardiac catheterization and possible coronary intervention were recommended.  ACCESS:  Right radial artery.  STUDY DETAILS:  A standard informed consent was obtained.  He was given fentanyl and Versed for sedation.  The right radial area was prepped in a sterile fashion.  It was anesthetized with 1% lidocaine.  A 5-French sheath was placed in the right radial artery after anterior puncture. Verapamil 3 mg was given through the sheath.  Heparin 5000 units was given intravenously.  Coronary angiography was performed with a TIG catheter.  For the right coronary artery, it was a difficult engagement. Both TIG catheter and AR-1 did not engage the vessel.  Thus, I used a JR- 4 catheter.  Left ventricular angiography was performed with a pigtail catheter.  All catheter exchanges were done over the wire.  At the end of the  procedure, we decided to proceed with the interventional in the mid LAD.  INTERVENTIONAL PROCEDURE NOTE:  The 5-French sheath was upsized to 6- [place] sheath.  IV bivalirudin was started with therapeutic ACT.  An XB 3.0 guiding catheter was used.  The lesion was crossed with a [place] wire.  It was predilated with a 2.5 x 12 sprinter balloon to 12 atmospheres and then to 10 atmospheres.  I then placed 3.0 x 16-mm Promus Element stent and deployed at 14 atmospheres.  This was postdilated with a 3.25 x 12-mm [place] Trek balloon to 16 atmospheres. Angiography showed that the stent was not well expanded and thus I used the same balloon to go up to 20 atmospheres with excellent results. There was 0% residual with TIMI 3 flow.  The stent was placed across the first diagonal.  The first diagonal had no ostial disease and really was not affected by this.  STUDY FINDINGS:  Hemodynamic findings:  Aortic pressure was 155/89 with a mean pressure of 117 mmHg.  Left ventricular pressure was 151/18 with a left ventricular end-diastolic pressure of 27 mmHg.  No significant gradient was noted across the aortic valve.  Left ventricular angiography:  This showed a borderline reduced LV systolic function with borderline global hypokinesis and  an ejection fraction of 50%.  Coronary angiography:  Left main coronary artery:  The vessel is normal in size and free of significant disease.  Left circumflex artery:  The vessel is large sized and dominant.  It has minor atherosclerosis without any obstructive disease.  First OM was small in size.  Second OM was medium size and free of significant disease.  The third OM was also small in size with mild atherosclerosis. The posterior AV groove artery was large sized vessel and free of significant disease.  It ended in normal sized left PDA and 2 posterolateral branches.  Left anterior descending artery:  The vessel is normal in size.  In the mid segment right  at the first diagonal branch, there is a 90% tubular stenosis.  The rest of the LAD is free of any significant disease. First diagonal is a normal-sized vessel with no ostial disease.  Second diagonal is normal size with no significant disease.  Third diagonal was very small.  Right coronary artery:  The vessel is small size and nondominant.  It has no significant disease.  CONCLUSION: 1. Severe single-vessel coronary artery disease with 90% mid LAD     stenosis which is the culprit for non-ST-elevation myocardial     infarction. 2. Borderline reduced LV systolic function with an ejection fraction     of 50% and moderately elevated left ventricular end-diastolic     pressure. 3. Successful angioplasty and drug-eluting stent placement to the mid     LAD with a drug-eluting Promus Element stent 3.0 x 16 mm which was     postdilated with a 3.25-mm noncompliant balloon.  There was 0%     residual with TIMI 3 flow.  RECOMMENDATIONS:  Recommend aspirin daily indefinitely as well as Effient 10 mg once daily for at least 12 months.  Recommend adding a beta-blocker to his ACE inhibitor.  We will initiate treatment with a carvedilol.  Aggressive treatment of risk factors is recommended.     Lorine Bears, MD     MA/MEDQ  D:  05/16/2010  T:  05/16/2010  Job:  [id number]  Electronically Signed by Lorine Bears MD on 06/12/2010 11:28:11 PM

## 2010-06-12 NOTE — Patient Instructions (Signed)
You have been referred to CARDIAC REHAB AS PER SCOTT WEAVER, PA-C... Your physician recommends that you schedule a follow-up appointment in: 2-3 MONTHS WITH DR. Clifton James AS PER SCOTT WEAVER,PA-C. Your physician recommends that you continue on your current medications as directed. Please refer to the Current Medication list given to you today. REFILLS HAVE BEEN SENT TO [facility] [place] FOR YOUR TOPROL, EFFIENT, AND LISINOPRIL/HCTZ...YOU HAVE ALSO BEEN GIVEN PAPER REFILLS FOR THESE  TO BE SENT TO YOUR MAIL ORDER PHARMACY AT YOUR CONVIENCE.

## 2010-06-12 NOTE — Assessment & Plan Note (Signed)
Doing well post PCI to the LAD.  We discussed the importance of Effient and aspirin.  We also discussed the importance of not using nitroglycerin along with phosphodiesterase inhibitors.  He will start cardiac rehabilitation soon and needs a referral from [place] to confirm this.  He saw several physicians in the hospital and was discharged by the hospitalist service.  I will establish him with Dr. Clifton James for followup in 2-3 months.  Refill meds (effient, toprol, lisinorpril/hct).  He has a CPE soon with his PCP.  He should have a CBC soon with Effient therapy and this should be included in his labs for CPE.

## 2010-06-12 NOTE — Assessment & Plan Note (Signed)
Controlled.  Continue current therapy.  

## 2010-06-13 ENCOUNTER — Other Ambulatory Visit: Payer: Self-pay | Admitting: Internal Medicine

## 2010-06-13 ENCOUNTER — Encounter (HOSPITAL_COMMUNITY): Payer: BC Managed Care – PPO | Attending: Internal Medicine

## 2010-06-13 ENCOUNTER — Encounter (HOSPITAL_COMMUNITY): Payer: BC Managed Care – PPO

## 2010-06-13 ENCOUNTER — Telehealth: Payer: Self-pay | Admitting: Physician Assistant

## 2010-06-13 DIAGNOSIS — J45909 Unspecified asthma, uncomplicated: Secondary | ICD-10-CM | POA: Insufficient documentation

## 2010-06-13 DIAGNOSIS — I10 Essential (primary) hypertension: Secondary | ICD-10-CM | POA: Insufficient documentation

## 2010-06-13 DIAGNOSIS — Z9641 Presence of insulin pump (external) (internal): Secondary | ICD-10-CM | POA: Insufficient documentation

## 2010-06-13 DIAGNOSIS — Z9861 Coronary angioplasty status: Secondary | ICD-10-CM | POA: Insufficient documentation

## 2010-06-13 DIAGNOSIS — F1010 Alcohol abuse, uncomplicated: Secondary | ICD-10-CM | POA: Insufficient documentation

## 2010-06-13 DIAGNOSIS — Z7982 Long term (current) use of aspirin: Secondary | ICD-10-CM | POA: Insufficient documentation

## 2010-06-13 DIAGNOSIS — I214 Non-ST elevation (NSTEMI) myocardial infarction: Secondary | ICD-10-CM | POA: Insufficient documentation

## 2010-06-13 DIAGNOSIS — I2510 Atherosclerotic heart disease of native coronary artery without angina pectoris: Secondary | ICD-10-CM | POA: Insufficient documentation

## 2010-06-13 DIAGNOSIS — Z8249 Family history of ischemic heart disease and other diseases of the circulatory system: Secondary | ICD-10-CM | POA: Insufficient documentation

## 2010-06-13 DIAGNOSIS — Z7902 Long term (current) use of antithrombotics/antiplatelets: Secondary | ICD-10-CM | POA: Insufficient documentation

## 2010-06-13 DIAGNOSIS — Z87891 Personal history of nicotine dependence: Secondary | ICD-10-CM | POA: Insufficient documentation

## 2010-06-13 DIAGNOSIS — Z794 Long term (current) use of insulin: Secondary | ICD-10-CM | POA: Insufficient documentation

## 2010-06-13 DIAGNOSIS — Z79899 Other long term (current) drug therapy: Secondary | ICD-10-CM | POA: Insufficient documentation

## 2010-06-13 DIAGNOSIS — Z5189 Encounter for other specified aftercare: Secondary | ICD-10-CM | POA: Insufficient documentation

## 2010-06-13 DIAGNOSIS — E785 Hyperlipidemia, unspecified: Secondary | ICD-10-CM | POA: Insufficient documentation

## 2010-06-13 DIAGNOSIS — E119 Type 2 diabetes mellitus without complications: Secondary | ICD-10-CM | POA: Insufficient documentation

## 2010-06-13 LAB — GLUCOSE, CAPILLARY: Glucose-Capillary: 99 mg/dL (ref 70–99)

## 2010-06-13 NOTE — Telephone Encounter (Signed)
LOV faxed to [facility] [phone]

## 2010-06-16 ENCOUNTER — Encounter (HOSPITAL_COMMUNITY): Payer: BC Managed Care – PPO

## 2010-06-16 ENCOUNTER — Encounter (HOSPITAL_COMMUNITY): Payer: BC Managed Care – PPO | Attending: Internal Medicine

## 2010-06-16 ENCOUNTER — Other Ambulatory Visit: Payer: Self-pay | Admitting: Internal Medicine

## 2010-06-16 DIAGNOSIS — Z9641 Presence of insulin pump (external) (internal): Secondary | ICD-10-CM | POA: Insufficient documentation

## 2010-06-16 DIAGNOSIS — I10 Essential (primary) hypertension: Secondary | ICD-10-CM | POA: Insufficient documentation

## 2010-06-16 DIAGNOSIS — E119 Type 2 diabetes mellitus without complications: Secondary | ICD-10-CM | POA: Insufficient documentation

## 2010-06-16 DIAGNOSIS — Z8249 Family history of ischemic heart disease and other diseases of the circulatory system: Secondary | ICD-10-CM | POA: Insufficient documentation

## 2010-06-16 DIAGNOSIS — I214 Non-ST elevation (NSTEMI) myocardial infarction: Secondary | ICD-10-CM | POA: Insufficient documentation

## 2010-06-16 DIAGNOSIS — Z7982 Long term (current) use of aspirin: Secondary | ICD-10-CM | POA: Insufficient documentation

## 2010-06-16 DIAGNOSIS — F1010 Alcohol abuse, uncomplicated: Secondary | ICD-10-CM | POA: Insufficient documentation

## 2010-06-16 DIAGNOSIS — J45909 Unspecified asthma, uncomplicated: Secondary | ICD-10-CM | POA: Insufficient documentation

## 2010-06-16 DIAGNOSIS — E785 Hyperlipidemia, unspecified: Secondary | ICD-10-CM | POA: Insufficient documentation

## 2010-06-16 DIAGNOSIS — Z87891 Personal history of nicotine dependence: Secondary | ICD-10-CM | POA: Insufficient documentation

## 2010-06-16 DIAGNOSIS — Z5189 Encounter for other specified aftercare: Secondary | ICD-10-CM | POA: Insufficient documentation

## 2010-06-16 DIAGNOSIS — Z794 Long term (current) use of insulin: Secondary | ICD-10-CM | POA: Insufficient documentation

## 2010-06-16 DIAGNOSIS — Z79899 Other long term (current) drug therapy: Secondary | ICD-10-CM | POA: Insufficient documentation

## 2010-06-16 DIAGNOSIS — Z9861 Coronary angioplasty status: Secondary | ICD-10-CM | POA: Insufficient documentation

## 2010-06-16 DIAGNOSIS — I2510 Atherosclerotic heart disease of native coronary artery without angina pectoris: Secondary | ICD-10-CM | POA: Insufficient documentation

## 2010-06-16 DIAGNOSIS — Z7902 Long term (current) use of antithrombotics/antiplatelets: Secondary | ICD-10-CM | POA: Insufficient documentation

## 2010-06-18 ENCOUNTER — Other Ambulatory Visit: Payer: Self-pay | Admitting: Internal Medicine

## 2010-06-18 ENCOUNTER — Encounter (HOSPITAL_COMMUNITY): Payer: BC Managed Care – PPO

## 2010-06-18 LAB — GLUCOSE, CAPILLARY: Glucose-Capillary: 78 mg/dL (ref 70–99)

## 2010-06-20 ENCOUNTER — Encounter (HOSPITAL_COMMUNITY): Payer: BC Managed Care – PPO

## 2010-06-20 ENCOUNTER — Other Ambulatory Visit: Payer: Self-pay | Admitting: Internal Medicine

## 2010-06-23 ENCOUNTER — Encounter (HOSPITAL_COMMUNITY): Payer: BC Managed Care – PPO

## 2010-06-23 ENCOUNTER — Other Ambulatory Visit: Payer: Self-pay | Admitting: Internal Medicine

## 2010-06-23 LAB — GLUCOSE, CAPILLARY
Glucose-Capillary: 140 mg/dL — ABNORMAL HIGH (ref 70–99)
Glucose-Capillary: 135 mg/dL — ABNORMAL HIGH (ref 70–99)

## 2010-06-24 LAB — GLUCOSE, CAPILLARY: Glucose-Capillary: 189 mg/dL — ABNORMAL HIGH (ref 70–99)

## 2010-06-25 ENCOUNTER — Encounter (HOSPITAL_COMMUNITY): Payer: BC Managed Care – PPO

## 2010-06-25 ENCOUNTER — Other Ambulatory Visit: Payer: Self-pay | Admitting: Internal Medicine

## 2010-06-25 LAB — GLUCOSE, CAPILLARY: Glucose-Capillary: 115 mg/dL — ABNORMAL HIGH (ref 70–99)

## 2010-06-27 ENCOUNTER — Other Ambulatory Visit: Payer: Self-pay | Admitting: Internal Medicine

## 2010-06-27 ENCOUNTER — Encounter (HOSPITAL_COMMUNITY): Payer: BC Managed Care – PPO

## 2010-06-30 ENCOUNTER — Other Ambulatory Visit: Payer: Self-pay | Admitting: Internal Medicine

## 2010-06-30 ENCOUNTER — Encounter (HOSPITAL_COMMUNITY): Payer: BC Managed Care – PPO

## 2010-06-30 LAB — GLUCOSE, CAPILLARY
Glucose-Capillary: 175 mg/dL — ABNORMAL HIGH (ref 70–99)
Glucose-Capillary: 127 mg/dL — ABNORMAL HIGH (ref 70–99)

## 2010-07-02 ENCOUNTER — Encounter (HOSPITAL_COMMUNITY): Payer: BC Managed Care – PPO

## 2010-07-02 ENCOUNTER — Other Ambulatory Visit: Payer: Self-pay | Admitting: Internal Medicine

## 2010-07-02 LAB — GLUCOSE, CAPILLARY: Glucose-Capillary: 191 mg/dL — ABNORMAL HIGH (ref 70–99)

## 2010-07-04 ENCOUNTER — Encounter (HOSPITAL_COMMUNITY): Payer: BC Managed Care – PPO

## 2010-07-04 ENCOUNTER — Other Ambulatory Visit (INDEPENDENT_AMBULATORY_CARE_PROVIDER_SITE_OTHER): Payer: BC Managed Care – PPO

## 2010-07-04 DIAGNOSIS — Z0000 Encounter for general adult medical examination without abnormal findings: Secondary | ICD-10-CM

## 2010-07-04 LAB — CBC WITH DIFFERENTIAL/PLATELET
Basophils Absolute: 0 10*3/uL (ref 0.0–0.1)
Basophils Relative: 0.5 % (ref 0.0–3.0)
Eosinophils Absolute: 0.1 10*3/uL (ref 0.0–0.7)
Hemoglobin: 14.2 g/dL (ref 13.0–17.0)
Lymphocytes Relative: 36.1 % (ref 12.0–46.0)
MCHC: 34.5 g/dL (ref 30.0–36.0)
Monocytes Relative: 10.4 % (ref 3.0–12.0)
Neutro Abs: 2.9 10*3/uL (ref 1.4–7.7)
Neutrophils Relative %: 51.4 % (ref 43.0–77.0)
RBC: 4.72 Mil/uL (ref 4.22–5.81)
RDW: 13.4 % (ref 11.5–14.6)

## 2010-07-04 LAB — BASIC METABOLIC PANEL
CO2: 29 mEq/L (ref 19–32)
Calcium: 9.8 mg/dL (ref 8.4–10.5)
Creatinine, Ser: 0.9 mg/dL (ref 0.4–1.5)
GFR: 91.13 mL/min (ref >60.00)
Sodium: 138 mEq/L (ref 135–145)

## 2010-07-04 LAB — HEPATIC FUNCTION PANEL
AST: 29 U/L (ref 0–37)
Alkaline Phosphatase: 53 U/L (ref 39–117)
Bilirubin, Direct: 0.1 mg/dL (ref 0.0–0.3)

## 2010-07-04 LAB — POCT URINALYSIS DIPSTICK
Blood, UA: NEGATIVE
Leukocytes, UA: NEGATIVE
Nitrite, UA: NEGATIVE
Protein, UA: NEGATIVE
Urobilinogen, UA: 0.2
pH, UA: 5

## 2010-07-04 LAB — LIPID PANEL
HDL: 27.9 mg/dL — ABNORMAL LOW (ref >39.00)
LDL Cholesterol: 48 mg/dL (ref 0–99)
Total CHOL/HDL Ratio: 4
Triglycerides: 114 mg/dL (ref 0.0–149.0)

## 2010-07-07 ENCOUNTER — Encounter (HOSPITAL_COMMUNITY): Payer: BC Managed Care – PPO

## 2010-07-07 ENCOUNTER — Other Ambulatory Visit: Payer: Self-pay | Admitting: Internal Medicine

## 2010-07-08 LAB — GLUCOSE, CAPILLARY: Glucose-Capillary: 172 mg/dL — ABNORMAL HIGH (ref 70–99)

## 2010-07-09 ENCOUNTER — Encounter (HOSPITAL_COMMUNITY): Payer: BC Managed Care – PPO

## 2010-07-09 ENCOUNTER — Other Ambulatory Visit: Payer: Self-pay | Admitting: Internal Medicine

## 2010-07-10 LAB — GLUCOSE, CAPILLARY: Glucose-Capillary: 211 mg/dL — ABNORMAL HIGH (ref 70–99)

## 2010-07-11 ENCOUNTER — Ambulatory Visit: Payer: BC Managed Care – PPO | Admitting: Family Medicine

## 2010-07-11 ENCOUNTER — Encounter (HOSPITAL_COMMUNITY): Payer: BC Managed Care – PPO

## 2010-07-11 ENCOUNTER — Encounter (HOSPITAL_COMMUNITY): Payer: BC Managed Care – PPO | Attending: Internal Medicine

## 2010-07-11 DIAGNOSIS — Z7902 Long term (current) use of antithrombotics/antiplatelets: Secondary | ICD-10-CM | POA: Insufficient documentation

## 2010-07-11 DIAGNOSIS — Z9641 Presence of insulin pump (external) (internal): Secondary | ICD-10-CM | POA: Insufficient documentation

## 2010-07-11 DIAGNOSIS — Z87891 Personal history of nicotine dependence: Secondary | ICD-10-CM | POA: Insufficient documentation

## 2010-07-11 DIAGNOSIS — Z5189 Encounter for other specified aftercare: Secondary | ICD-10-CM | POA: Insufficient documentation

## 2010-07-11 DIAGNOSIS — Z794 Long term (current) use of insulin: Secondary | ICD-10-CM | POA: Insufficient documentation

## 2010-07-11 DIAGNOSIS — F1010 Alcohol abuse, uncomplicated: Secondary | ICD-10-CM | POA: Insufficient documentation

## 2010-07-11 DIAGNOSIS — Z79899 Other long term (current) drug therapy: Secondary | ICD-10-CM | POA: Insufficient documentation

## 2010-07-11 DIAGNOSIS — Z8249 Family history of ischemic heart disease and other diseases of the circulatory system: Secondary | ICD-10-CM | POA: Insufficient documentation

## 2010-07-11 DIAGNOSIS — Z7982 Long term (current) use of aspirin: Secondary | ICD-10-CM | POA: Insufficient documentation

## 2010-07-11 DIAGNOSIS — Z9861 Coronary angioplasty status: Secondary | ICD-10-CM | POA: Insufficient documentation

## 2010-07-11 DIAGNOSIS — I10 Essential (primary) hypertension: Secondary | ICD-10-CM | POA: Insufficient documentation

## 2010-07-11 DIAGNOSIS — I214 Non-ST elevation (NSTEMI) myocardial infarction: Secondary | ICD-10-CM | POA: Insufficient documentation

## 2010-07-11 DIAGNOSIS — J45909 Unspecified asthma, uncomplicated: Secondary | ICD-10-CM | POA: Insufficient documentation

## 2010-07-11 DIAGNOSIS — E119 Type 2 diabetes mellitus without complications: Secondary | ICD-10-CM | POA: Insufficient documentation

## 2010-07-11 DIAGNOSIS — E785 Hyperlipidemia, unspecified: Secondary | ICD-10-CM | POA: Insufficient documentation

## 2010-07-11 DIAGNOSIS — I2510 Atherosclerotic heart disease of native coronary artery without angina pectoris: Secondary | ICD-10-CM | POA: Insufficient documentation

## 2010-07-14 ENCOUNTER — Encounter (HOSPITAL_COMMUNITY): Payer: BC Managed Care – PPO

## 2010-07-16 ENCOUNTER — Encounter (HOSPITAL_COMMUNITY): Payer: BC Managed Care – PPO | Attending: Internal Medicine

## 2010-07-16 ENCOUNTER — Encounter (HOSPITAL_COMMUNITY): Payer: BC Managed Care – PPO

## 2010-07-16 DIAGNOSIS — Z79899 Other long term (current) drug therapy: Secondary | ICD-10-CM | POA: Insufficient documentation

## 2010-07-16 DIAGNOSIS — Z9861 Coronary angioplasty status: Secondary | ICD-10-CM | POA: Insufficient documentation

## 2010-07-16 DIAGNOSIS — I10 Essential (primary) hypertension: Secondary | ICD-10-CM | POA: Insufficient documentation

## 2010-07-16 DIAGNOSIS — Z794 Long term (current) use of insulin: Secondary | ICD-10-CM | POA: Insufficient documentation

## 2010-07-16 DIAGNOSIS — Z7982 Long term (current) use of aspirin: Secondary | ICD-10-CM | POA: Insufficient documentation

## 2010-07-16 DIAGNOSIS — E119 Type 2 diabetes mellitus without complications: Secondary | ICD-10-CM | POA: Insufficient documentation

## 2010-07-16 DIAGNOSIS — Z7902 Long term (current) use of antithrombotics/antiplatelets: Secondary | ICD-10-CM | POA: Insufficient documentation

## 2010-07-16 DIAGNOSIS — I214 Non-ST elevation (NSTEMI) myocardial infarction: Secondary | ICD-10-CM | POA: Insufficient documentation

## 2010-07-16 DIAGNOSIS — Z8249 Family history of ischemic heart disease and other diseases of the circulatory system: Secondary | ICD-10-CM | POA: Insufficient documentation

## 2010-07-16 DIAGNOSIS — Z5189 Encounter for other specified aftercare: Secondary | ICD-10-CM | POA: Insufficient documentation

## 2010-07-16 DIAGNOSIS — Z9641 Presence of insulin pump (external) (internal): Secondary | ICD-10-CM | POA: Insufficient documentation

## 2010-07-16 DIAGNOSIS — F1010 Alcohol abuse, uncomplicated: Secondary | ICD-10-CM | POA: Insufficient documentation

## 2010-07-16 DIAGNOSIS — Z87891 Personal history of nicotine dependence: Secondary | ICD-10-CM | POA: Insufficient documentation

## 2010-07-16 DIAGNOSIS — I2510 Atherosclerotic heart disease of native coronary artery without angina pectoris: Secondary | ICD-10-CM | POA: Insufficient documentation

## 2010-07-16 DIAGNOSIS — J45909 Unspecified asthma, uncomplicated: Secondary | ICD-10-CM | POA: Insufficient documentation

## 2010-07-16 DIAGNOSIS — E785 Hyperlipidemia, unspecified: Secondary | ICD-10-CM | POA: Insufficient documentation

## 2010-07-17 ENCOUNTER — Other Ambulatory Visit: Payer: Self-pay | Admitting: Family Medicine

## 2010-07-18 ENCOUNTER — Encounter (HOSPITAL_COMMUNITY): Payer: BC Managed Care – PPO

## 2010-07-18 ENCOUNTER — Ambulatory Visit (INDEPENDENT_AMBULATORY_CARE_PROVIDER_SITE_OTHER): Payer: BC Managed Care – PPO | Admitting: Family Medicine

## 2010-07-18 ENCOUNTER — Encounter: Payer: Self-pay | Admitting: Family Medicine

## 2010-07-18 DIAGNOSIS — E785 Hyperlipidemia, unspecified: Secondary | ICD-10-CM

## 2010-07-18 DIAGNOSIS — N529 Male erectile dysfunction, unspecified: Secondary | ICD-10-CM

## 2010-07-18 MED ORDER — SIMVASTATIN 20 MG PO TABS: 20.0000 mg | ORAL_TABLET | Freq: Every day | ORAL | Status: DC

## 2010-07-18 MED ORDER — TADALAFIL 20 MG PO TABS: 20.0000 mg | ORAL_TABLET | Freq: Every day | ORAL | Status: DC | PRN

## 2010-07-18 NOTE — Progress Notes (Signed)
Subjective:    Patient ID: Joel Williams, male    DOB: 1950/04/06, 60 y.o.   MRN: [id number]  HPI Patient here for complete physical examination. Past medical history significant for CAD, type 2 diabetes, hyperlipidemia, hypertension, erectile dysfunction. Undergoing cardiac rehabilitation. No recent chest pains. Has recently lost some weight and is exercising more frequently. Blood sugars have improved somewhat.  Every three-year colonoscopy and due next year.  Tetanus and Pneumovax 2007.  No history of shingles vaccine.  Past Medical History  Diagnosis Date  . DIABETES MELLITUS, TYPE II 06/08/2008  . HYPERLIPIDEMIA 06/08/2008  . HYPERTENSION 06/08/2008  . ASTHMA 06/08/2008  . Impotence of organic origin 08/24/2008  . Chronic kidney disease   . Hemorrhoids   . Colonic polyp   . CAD (coronary artery disease)     s/p Promus DES (3.0 x 16 mm) to mid LAD 05/16/10;  Myoview 05/15/10: anteroseptal ischemia with EF 52%;  Cath 05/16/10: single vessel CAD with mLAD 90% tx with PCI and EF 50%   No past surgical history on file.  reports that he quit smoking about 8 years ago. His smoking use included Cigarettes. He has a 35 pack-year smoking history. He does not have any smokeless tobacco history on file. His alcohol and drug histories not on file. family history includes Diabetes in his father and Heart disease in his father. No Known Allergies    Review of Systems  Constitutional: Negative for fever, activity change, appetite change and fatigue.  HENT: Negative for ear pain, congestion and trouble swallowing.   Eyes: Negative for pain and visual disturbance.  Respiratory: Negative for cough, shortness of breath and wheezing.   Cardiovascular: Negative for chest pain and palpitations.  Gastrointestinal: Negative for nausea, vomiting, abdominal pain, diarrhea, constipation, blood in stool, abdominal distention and rectal pain.  Genitourinary: Negative for dysuria, hematuria and testicular pain.    Musculoskeletal: Negative for joint swelling and arthralgias.  Skin: Negative for rash.  Neurological: Negative for dizziness, syncope and headaches.  Hematological: Negative for adenopathy.  Psychiatric/Behavioral: Negative for confusion and dysphoric mood.       Objective:   Physical Exam  Constitutional: He is oriented to person, place, and time. He appears well-developed and well-nourished. No distress.  HENT:  Head: Normocephalic and atraumatic.  Right Ear: External ear normal.  Left Ear: External ear normal.  Mouth/Throat: Oropharynx is clear and moist.  Eyes: Conjunctivae and EOM are normal. Pupils are equal, round, and reactive to light.  Neck: Normal range of motion. Neck supple. No thyromegaly present.  Cardiovascular: Normal rate, regular rhythm and normal heart sounds.   No murmur heard. Pulmonary/Chest: No respiratory distress. He has no wheezes. He has no rales.  Abdominal: Soft. Bowel sounds are normal. He exhibits no distension and no mass. There is no tenderness. There is no rebound and no guarding.  Musculoskeletal: He exhibits no edema.  Lymphadenopathy:    He has no cervical adenopathy.  Neurological: He is alert and oriented to person, place, and time. He displays normal reflexes. No cranial nerve deficit.  Skin: No rash noted.  Psychiatric: He has a normal mood and affect.          Assessment & Plan:  #1 complete physical examination. He will check with insurance coming to verify shingles vaccine coverage. Continue to work on weight loss and more consistent exercise #2 hyperlipidemia refilled simvastatin for one year #3 history of erectile dysfunction. Refilled Cialis for one year. Consider early morning testosterone level at some  point as he had some issues with low libido and decreased stamina and energy.

## 2010-07-21 ENCOUNTER — Encounter (HOSPITAL_COMMUNITY): Payer: BC Managed Care – PPO

## 2010-07-23 ENCOUNTER — Encounter (HOSPITAL_COMMUNITY): Payer: BC Managed Care – PPO

## 2010-07-24 ENCOUNTER — Telehealth: Payer: Self-pay | Admitting: *Deleted

## 2010-07-24 NOTE — Telephone Encounter (Signed)
Received PC from [facility] pharmacist questioning Cialis refill.  #1, They have a history of angina on pt record.  #2, Pt had Nitroglycerine filled at a local pharmacy from Dr Bernita Buffy,( phone [phone]) and #3, one sig is may take daily prn, another sig reads may take every other day prn. Please confirm if pt should be taking Cialis

## 2010-07-25 ENCOUNTER — Encounter (HOSPITAL_COMMUNITY): Payer: BC Managed Care – PPO

## 2010-07-25 NOTE — Telephone Encounter (Signed)
Dose should be every other day prn.  He does have hx of CAD but stable and NTG was precautionary only and he has not been taking this.  We do need to remind pt not to take these two together.

## 2010-07-25 NOTE — Telephone Encounter (Signed)
[facility] pharmacy was called and message given.  I also called pt cell number, left a detailed message on pt personally identified VM he is NOT to take Nitro and Cialis together.

## 2010-07-28 ENCOUNTER — Encounter (HOSPITAL_COMMUNITY): Payer: BC Managed Care – PPO

## 2010-07-30 ENCOUNTER — Encounter (HOSPITAL_COMMUNITY): Payer: BC Managed Care – PPO

## 2010-07-30 ENCOUNTER — Other Ambulatory Visit: Payer: Self-pay | Admitting: Family Medicine

## 2010-08-01 ENCOUNTER — Encounter (HOSPITAL_COMMUNITY): Payer: BC Managed Care – PPO

## 2010-08-04 ENCOUNTER — Telehealth: Payer: Self-pay | Admitting: *Deleted

## 2010-08-04 ENCOUNTER — Encounter (HOSPITAL_COMMUNITY): Payer: BC Managed Care – PPO

## 2010-08-04 ENCOUNTER — Telehealth: Payer: Self-pay | Admitting: Family Medicine

## 2010-08-04 ENCOUNTER — Ambulatory Visit (HOSPITAL_BASED_OUTPATIENT_CLINIC_OR_DEPARTMENT_OTHER)
Admission: RE | Admit: 2010-08-04 | Discharge: 2010-08-04 | Disposition: A | Discharge status: Home / Self Care | Payer: BC Managed Care – PPO | Source: Ambulatory Visit | Attending: Family Medicine | Admitting: Family Medicine

## 2010-08-04 DIAGNOSIS — R062 Wheezing: Secondary | ICD-10-CM

## 2010-08-04 DIAGNOSIS — R509 Fever, unspecified: Secondary | ICD-10-CM | POA: Insufficient documentation

## 2010-08-04 DIAGNOSIS — R911 Solitary pulmonary nodule: Secondary | ICD-10-CM

## 2010-08-04 NOTE — Telephone Encounter (Signed)
Lupita Leash informed order was oked and sent

## 2010-08-04 NOTE — Telephone Encounter (Signed)
Pt called req results from chest xray. Pls call back asap.

## 2010-08-04 NOTE — Telephone Encounter (Signed)
Joel Williams from [facility] calling to request CXR order for pt.  Pt employer medical [name] PA (who only works part time) recommended CXR due to pt sx of wheezing and crackles.

## 2010-08-04 NOTE — Telephone Encounter (Signed)
I don't see results.  Done outside of our system.  Was this to be faxed?

## 2010-08-04 NOTE — Telephone Encounter (Signed)
OK to order 

## 2010-08-04 NOTE — Telephone Encounter (Signed)
Please advise 

## 2010-08-05 NOTE — Telephone Encounter (Signed)
Pt called back again to check status of chest xray, that pt had done at [facility]. * Pls see Imaging Tab in pts chart for results.

## 2010-08-05 NOTE — Telephone Encounter (Signed)
cxr no pneumonia.  Nonspecific LLL ?nodule vs confluence of shadows.  Radiologist suggests consider CT chest to evaluate.  We could go ahead and order but I recommend follow up in 2 weeks for office reassess and discuss repeat CXR then vs CT.

## 2010-08-05 NOTE — Progress Notes (Signed)
Quick Note:  See phone note with Dr Lucie Leather directives ______

## 2010-08-05 NOTE — Telephone Encounter (Signed)
Detailed message left on pt cell phone exactly what Dr Lucie Leather information and recommendations were.  He may call back with question, concerns.

## 2010-08-06 ENCOUNTER — Telehealth: Payer: Self-pay | Admitting: Family Medicine

## 2010-08-06 ENCOUNTER — Encounter (HOSPITAL_COMMUNITY): Payer: BC Managed Care – PPO

## 2010-08-06 ENCOUNTER — Ambulatory Visit (INDEPENDENT_AMBULATORY_CARE_PROVIDER_SITE_OTHER): Payer: BC Managed Care – PPO | Admitting: Family Medicine

## 2010-08-06 ENCOUNTER — Encounter: Payer: Self-pay | Admitting: Family Medicine

## 2010-08-06 DIAGNOSIS — J209 Acute bronchitis, unspecified: Secondary | ICD-10-CM

## 2010-08-06 DIAGNOSIS — E041 Nontoxic single thyroid nodule: Secondary | ICD-10-CM

## 2010-08-06 DIAGNOSIS — R9389 Abnormal findings on diagnostic imaging of other specified body structures: Secondary | ICD-10-CM

## 2010-08-06 DIAGNOSIS — Z23 Encounter for immunization: Secondary | ICD-10-CM

## 2010-08-06 DIAGNOSIS — R918 Other nonspecific abnormal finding of lung field: Secondary | ICD-10-CM

## 2010-08-06 MED ORDER — AZITHROMYCIN 250 MG PO TABS: 250.0000 mg | ORAL_TABLET | Freq: Every day | ORAL | Status: AC

## 2010-08-06 MED ORDER — LORAZEPAM 1 MG PO TABS: 1.0000 mg | ORAL_TABLET | Freq: Three times a day (TID) | ORAL | Status: AC

## 2010-08-06 MED ORDER — AZITHROMYCIN 250 MG PO TABS: 250.0000 mg | ORAL_TABLET | Freq: Every day | ORAL | Status: DC

## 2010-08-06 MED ORDER — LORAZEPAM 1 MG PO TABS: 1.0000 mg | ORAL_TABLET | Freq: Three times a day (TID) | ORAL | Status: DC

## 2010-08-06 NOTE — Progress Notes (Addendum)
  Subjective:    Patient ID: Joel Williams, male    DOB: [birth year], 60 y.o.   MRN: [id number]  HPI Patient is seen with cough. Started last weekend. Patient has long history of smoking. He had some congestion and sore throat along with increased malaise. 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 Sent for chest x-ray which did not show any pneumonia or effusion. Did reveal 16 mm left lower lobe nodule with irregular features. Recommendation for CT of chest. Patient denies any chronic cough, hemoptysis, appetite, or weight changes. No dyspnea. Has had no associated headache. Cough is productive.  Patient's chronic problems include history of CAD, type 2 diabetes, hyperlipidemia, hypertension. Medications are reviewed.   Review of Systems  Constitutional: Negative for fever, appetite change and unexpected weight change.  HENT: Positive for congestion and sore throat. Negative for trouble swallowing and sinus pressure.   Respiratory: Positive for cough. Negative for shortness of breath and wheezing.   Cardiovascular: Negative for chest pain, palpitations and leg swelling.  Skin: Negative for rash.  Hematological: Negative for adenopathy.       Objective:   Physical Exam  Constitutional: He appears well-developed and well-nourished.  HENT:  Right Ear: External ear normal.  Left Ear: External ear normal.  Mouth/Throat: Oropharynx is clear and moist.  Neck: Neck supple. No thyromegaly present.  Cardiovascular: Normal rate, regular rhythm and normal heart sounds.   Pulmonary/Chest: Effort normal and breath sounds normal. No respiratory distress. He has no wheezes. He has no rales.  Musculoskeletal: He exhibits no edema.  Lymphadenopathy:    He has no cervical adenopathy.          Assessment & Plan:  Patient presents with productive cough. Suspect acute bronchitis. Abnormal chest x-ray with 16 mm irregular left lower lobe nodule of  undetermined significance. CT chest recommended. With smoking history start Zithromax. Pt concerned for anxiety symptoms with X-ray.  Pre-medicate with Lorazepam 1mg .  CT chest no acute abnormality.  ?calcified nodule R thyroid with ultrasound recommended.  Will set up.  Pt aware of results.

## 2010-08-06 NOTE — Telephone Encounter (Signed)
Pt informed Rx sent. 

## 2010-08-06 NOTE — Telephone Encounter (Signed)
Please advise 

## 2010-08-06 NOTE — Telephone Encounter (Signed)
Pt called and said that [facility] in [place] has still not rcvd the scripts for Azithromycin or the script for Lorazepam. Do not send these to [facility]. Pls cx [facility] and sent to local [facility] [place].

## 2010-08-08 ENCOUNTER — Encounter (HOSPITAL_COMMUNITY): Payer: BC Managed Care – PPO

## 2010-08-11 ENCOUNTER — Encounter (HOSPITAL_COMMUNITY): Payer: BC Managed Care – PPO

## 2010-08-13 ENCOUNTER — Encounter (HOSPITAL_COMMUNITY): Payer: BC Managed Care – PPO

## 2010-08-15 ENCOUNTER — Encounter (HOSPITAL_COMMUNITY): Payer: BC Managed Care – PPO

## 2010-08-15 ENCOUNTER — Encounter (HOSPITAL_COMMUNITY): Payer: BC Managed Care – PPO | Attending: Internal Medicine

## 2010-08-15 DIAGNOSIS — I214 Non-ST elevation (NSTEMI) myocardial infarction: Secondary | ICD-10-CM | POA: Insufficient documentation

## 2010-08-15 DIAGNOSIS — I10 Essential (primary) hypertension: Secondary | ICD-10-CM | POA: Insufficient documentation

## 2010-08-15 DIAGNOSIS — Z87891 Personal history of nicotine dependence: Secondary | ICD-10-CM | POA: Insufficient documentation

## 2010-08-15 DIAGNOSIS — E785 Hyperlipidemia, unspecified: Secondary | ICD-10-CM | POA: Insufficient documentation

## 2010-08-15 DIAGNOSIS — Z8249 Family history of ischemic heart disease and other diseases of the circulatory system: Secondary | ICD-10-CM | POA: Insufficient documentation

## 2010-08-15 DIAGNOSIS — Z9641 Presence of insulin pump (external) (internal): Secondary | ICD-10-CM | POA: Insufficient documentation

## 2010-08-15 DIAGNOSIS — Z79899 Other long term (current) drug therapy: Secondary | ICD-10-CM | POA: Insufficient documentation

## 2010-08-15 DIAGNOSIS — I2510 Atherosclerotic heart disease of native coronary artery without angina pectoris: Secondary | ICD-10-CM | POA: Insufficient documentation

## 2010-08-15 DIAGNOSIS — E119 Type 2 diabetes mellitus without complications: Secondary | ICD-10-CM | POA: Insufficient documentation

## 2010-08-15 DIAGNOSIS — Z794 Long term (current) use of insulin: Secondary | ICD-10-CM | POA: Insufficient documentation

## 2010-08-15 DIAGNOSIS — Z5189 Encounter for other specified aftercare: Secondary | ICD-10-CM | POA: Insufficient documentation

## 2010-08-15 DIAGNOSIS — Z9861 Coronary angioplasty status: Secondary | ICD-10-CM | POA: Insufficient documentation

## 2010-08-15 DIAGNOSIS — J45909 Unspecified asthma, uncomplicated: Secondary | ICD-10-CM | POA: Insufficient documentation

## 2010-08-15 DIAGNOSIS — F1010 Alcohol abuse, uncomplicated: Secondary | ICD-10-CM | POA: Insufficient documentation

## 2010-08-15 DIAGNOSIS — Z7902 Long term (current) use of antithrombotics/antiplatelets: Secondary | ICD-10-CM | POA: Insufficient documentation

## 2010-08-15 DIAGNOSIS — Z7982 Long term (current) use of aspirin: Secondary | ICD-10-CM | POA: Insufficient documentation

## 2010-08-18 ENCOUNTER — Encounter (HOSPITAL_COMMUNITY): Payer: BC Managed Care – PPO

## 2010-08-18 ENCOUNTER — Ambulatory Visit (INDEPENDENT_AMBULATORY_CARE_PROVIDER_SITE_OTHER)
Admission: RE | Admit: 2010-08-18 | Discharge: 2010-08-18 | Disposition: A | Discharge status: Home / Self Care | Payer: BC Managed Care – PPO | Source: Ambulatory Visit | Attending: Family Medicine | Admitting: Family Medicine

## 2010-08-18 DIAGNOSIS — R9389 Abnormal findings on diagnostic imaging of other specified body structures: Secondary | ICD-10-CM

## 2010-08-18 DIAGNOSIS — R918 Other nonspecific abnormal finding of lung field: Secondary | ICD-10-CM

## 2010-08-18 MED ORDER — IOHEXOL 300 MG/ML  SOLN
80.0000 mL | Freq: Once | INTRAMUSCULAR | Status: AC | PRN
  Administered 2010-08-18: 80 mL via INTRAVENOUS

## 2010-08-19 NOTE — Progress Notes (Signed)
Addended by: Kristian Covey on: 08/19/2010 08:25 AM   Modules accepted: Orders

## 2010-08-20 ENCOUNTER — Encounter (HOSPITAL_COMMUNITY): Payer: BC Managed Care – PPO

## 2010-08-22 ENCOUNTER — Encounter (HOSPITAL_COMMUNITY): Payer: BC Managed Care – PPO

## 2010-08-25 ENCOUNTER — Other Ambulatory Visit: Payer: Self-pay | Admitting: Internal Medicine

## 2010-08-25 ENCOUNTER — Encounter (HOSPITAL_COMMUNITY): Payer: BC Managed Care – PPO

## 2010-08-25 ENCOUNTER — Telehealth: Payer: Self-pay | Admitting: *Deleted

## 2010-08-25 LAB — BASIC METABOLIC PANEL
BUN: 18 mg/dL (ref 6–23)
GFR calc Af Amer: >60 mL/min (ref >60)
GFR calc non Af Amer: >60 mL/min (ref >60)
Potassium: 4.6 mEq/L (ref 3.5–5.1)
Sodium: 135 mEq/L (ref 135–145)

## 2010-08-25 NOTE — Telephone Encounter (Signed)
Per Byrd Hesselbach in cardiac rehab - while exercising the pt developed trigeminal PVC's and complained of palps.  He reports feeling much better now and is without palps.  He is no longer being monitored in cardiac rehab as he was in NSR.  pts last BMP demonstrated a potassium level of 5.0, an order to repeat this lab work was given and faxed to the main lab at [facility].

## 2010-08-26 ENCOUNTER — Telehealth: Payer: Self-pay | Admitting: Cardiovascular Disease

## 2010-08-26 NOTE — Telephone Encounter (Signed)
lmtcb

## 2010-08-26 NOTE — Telephone Encounter (Signed)
Potassium 08/25/2010 was normal at 4.6.  All other elements of BMP were normal except BS at 115.

## 2010-08-26 NOTE — Telephone Encounter (Signed)
Byrd Hesselbach has questions re meds for pt

## 2010-08-26 NOTE — Telephone Encounter (Signed)
Joel Williams in cardiac rehab wanted to inform me that Mr. Ferrick drinks at least > than 6-10 cups of coffee daily. He was informed to decrease his intake of caffeine due to his increased risk for palpitations. She wanted to know if he could resume cardiac rehab tomorrow. I advised her that he should be able to but to inform us if he continues to go back into trigeminy and is symptomatic.

## 2010-08-26 NOTE — Telephone Encounter (Signed)
Left message at pts home number for pt of normal lab results except for BS of 115.  Requested pt call back with questions.

## 2010-08-27 ENCOUNTER — Encounter (HOSPITAL_COMMUNITY): Payer: BC Managed Care – PPO

## 2010-08-27 ENCOUNTER — Ambulatory Visit
Admission: RE | Admit: 2010-08-27 | Discharge: 2010-08-27 | Disposition: A | Discharge status: Home / Self Care | Payer: BC Managed Care – PPO | Source: Ambulatory Visit | Attending: Family Medicine | Admitting: Family Medicine

## 2010-08-27 DIAGNOSIS — E041 Nontoxic single thyroid nodule: Secondary | ICD-10-CM

## 2010-08-29 ENCOUNTER — Other Ambulatory Visit: Payer: Self-pay | Admitting: Family Medicine

## 2010-08-29 ENCOUNTER — Encounter (HOSPITAL_COMMUNITY): Payer: BC Managed Care – PPO

## 2010-08-29 NOTE — Telephone Encounter (Signed)
Rx given at OV 5/23, "suspect acute bronchitis, productive cough" Please advise

## 2010-08-30 NOTE — Telephone Encounter (Signed)
Ok to refill once

## 2010-09-01 ENCOUNTER — Encounter (HOSPITAL_COMMUNITY): Payer: BC Managed Care – PPO

## 2010-09-01 ENCOUNTER — Telehealth: Payer: Self-pay | Admitting: *Deleted

## 2010-09-01 DIAGNOSIS — E041 Nontoxic single thyroid nodule: Secondary | ICD-10-CM

## 2010-09-01 NOTE — Telephone Encounter (Signed)
Refill once OK. 

## 2010-09-01 NOTE — Telephone Encounter (Signed)
VM from pt returning call from am  about his thyroid scan results

## 2010-09-01 NOTE — Telephone Encounter (Signed)
Last filled on 08-06-10 Please advise

## 2010-09-01 NOTE — Telephone Encounter (Signed)
Dominant R thyroid nodule with calcification.  Bx recommended.  Pt notified and will set up bx through radiology.

## 2010-09-02 ENCOUNTER — Telehealth: Payer: Self-pay

## 2010-09-02 NOTE — Telephone Encounter (Signed)
Opened in error

## 2010-09-03 ENCOUNTER — Encounter (HOSPITAL_COMMUNITY): Payer: BC Managed Care – PPO

## 2010-09-03 ENCOUNTER — Ambulatory Visit (INDEPENDENT_AMBULATORY_CARE_PROVIDER_SITE_OTHER): Payer: BC Managed Care – PPO | Admitting: Cardiovascular Disease

## 2010-09-03 ENCOUNTER — Other Ambulatory Visit: Payer: Self-pay | Admitting: Family Medicine

## 2010-09-03 ENCOUNTER — Encounter: Payer: Self-pay | Admitting: Cardiovascular Disease

## 2010-09-03 VITALS — BP 110/64 | HR 66 | Ht 70.0 in | Wt 241.8 lb

## 2010-09-03 DIAGNOSIS — I2510 Atherosclerotic heart disease of native coronary artery without angina pectoris: Secondary | ICD-10-CM

## 2010-09-03 DIAGNOSIS — E041 Nontoxic single thyroid nodule: Secondary | ICD-10-CM

## 2010-09-03 DIAGNOSIS — R002 Palpitations: Secondary | ICD-10-CM | POA: Insufficient documentation

## 2010-09-03 MED ORDER — ASPIRIN 81 MG PO TABS: 81.0000 mg | ORAL_TABLET | Freq: Every day | ORAL | Status: DC

## 2010-09-03 NOTE — Progress Notes (Signed)
History of Present Illness:60 y.o. male with a history of CAD, DM, HTN,  hyperlipidemia who presented to the hospital on 05/14/10 with chest pain. MI was ruled out. Myoview study was positive for anteroseptal ischemia and EF of 52%. Cardiac catheterization demonstrated single vessel CAD with a mid LAD stenosis of 90%. Dr. Kirke Corin performed his cath. This was treated with a Promus DES. He was placed on aspirin and Effient. He was seen by Tereso Newcomer, PA-C in March. He is here to see me today for the first time.   He has had no chest pain or SOB. He is having daily palpitations, mostly at night. Not prolonged but feels like skipped beats.    Past Medical History  Diagnosis Date  . DIABETES MELLITUS, TYPE II 06/08/2008  . HYPERLIPIDEMIA 06/08/2008  . HYPERTENSION 06/08/2008  . ASTHMA 06/08/2008  . Impotence of organic origin 08/24/2008  . Chronic kidney disease   . Hemorrhoids   . Colonic polyp   . CAD (coronary artery disease)     s/p Promus DES (3.0 x 16 mm) to mid LAD 05/16/10;  Myoview 05/15/10: anteroseptal ischemia with EF 52%;  Cath 05/16/10: single vessel CAD with mLAD 90% tx with PCI and EF 50%    No past surgical history on file.  Current Outpatient Prescriptions  Medication Sig Dispense Refill  . Ascorbic Acid (VITAMIN C) 100 MG tablet Take 100 mg by mouth daily.        Marland Kitchen aspirin 325 MG tablet Take 325 mg by mouth daily.        . BD ULTRA-FINE LANCETS lancets by Other route 2 (two) times daily. Use as instructed       . Biotin 10 MG TABS Take by mouth.        . Cholecalciferol (VITAMIN D) 2000 UNITS CAPS Take by mouth.        . fish oil-omega-3 fatty acids 1000 MG capsule Take 1 g by mouth daily.       . fluticasone (FLONASE) 50 MCG/ACT nasal spray 2 sprays by Nasal route daily.        . insulin aspart (NOVOLOG) 100 UNIT/ML injection Pump 40.2 units per day continuous, basal rate Bolus rate 3.0 to 11.5       . lisinopril-hydrochlorothiazide (PRINZIDE,ZESTORETIC) 20-12.5 MG per tablet Take  1 tablet by mouth daily.  90 tablet  0  . metFORMIN (GLUCOPHAGE) 500 MG tablet Take 2 tabs in the morning and 2 tabs in the evening  360 tablet  2  . metoprolol succinate (TOPROL-XL) 25 MG 24 hr tablet Take 1 tablet (25 mg total) by mouth daily.  90 tablet  0  . mometasone (NASONEX) 50 MCG/ACT nasal spray 2 sprays by Nasal route daily.        . Multiple Vitamins-Minerals (ICAPS MV) TABS Take by mouth daily.        . nitroGLYCERIN (NITROSTAT) 0.4 MG SL tablet Place 0.4 mg under the tongue every 5 (five) minutes as needed.        . NON FORMULARY Novolog 100 units/ml solution insulin pump       . prasugrel (EFFIENT) 10 MG TABS Take 1 tablet (10 mg total) by mouth daily.  90 tablet  0  . simvastatin (ZOCOR) 20 MG tablet Take 1 tablet (20 mg total) by mouth at bedtime.  90 tablet  3  . tadalafil (CIALIS) 20 MG tablet Take 1 tablet (20 mg total) by mouth daily as needed for erectile dysfunction. Every other day  as needed  18 tablet  3  . vitamin E 400 UNIT capsule Take 400 Units by mouth daily.        Marland Kitchen DISCONTD: hydrocodone-acetaminophen (LORCET-HD) 5-500 MG per capsule Take 1 capsule by mouth every 6 (six) hours as needed.          No Known Allergies  History   Social History  . Marital Status: Married    Spouse Name: N/A    Number of Children: N/A  . Years of Education: N/A   Occupational History  . Not on file.   Social History Main Topics  . Smoking status: Former Smoker -- 1.0 packs/day for 35 years    Types: Cigarettes    Quit date: 05/23/2002  . Smokeless tobacco: Not on file  . Alcohol Use: Not on file  . Drug Use: Not on file  . Sexually Active: Not on file   Other Topics Concern  . Not on file   Social History Narrative  . No narrative on file    Family History  Problem Relation Age of Onset  . Diabetes Father   . Heart disease Father     Review of Systems:  As stated in the HPI and otherwise negative.   BP 110/64  Pulse 66  Ht 5\' 10"  (1.778 m)  Wt 241 lb  12.8 oz (109.68 kg)  BMI 34.69 kg/m2  Physical Examination: General: Well developed, well nourished, NAD HEENT: OP clear, mucus membranes moist SKIN: warm, dry. No rashes. Neuro: No focal deficits Musculoskeletal: Muscle strength 5/5 all ext Psychiatric: Mood and affect normal Neck: No JVD, no carotid bruits, no thyromegaly, no lymphadenopathy. Lungs:Clear bilaterally, no wheezes, rhonci, crackles Cardiovascular: Regular rate and rhythm. No murmurs, gallops or rubs. Abdomen:Soft. Bowel sounds present. Non-tender.  Extremities: No lower extremity edema. Pulses are 2 + in the bilateral DP/PT.

## 2010-09-03 NOTE — Assessment & Plan Note (Signed)
BP well controlled.

## 2010-09-03 NOTE — Patient Instructions (Signed)
Your physician recommends that you schedule a follow-up appointment in: 6 months  Your physician has recommended that you wear a 48 hour holter monitor. Holter monitors are medical devices that record the heart's electrical activity. Doctors most often use these monitors to diagnose arrhythmias. Arrhythmias are problems with the speed or rhythm of the heartbeat. The monitor is a small, portable device. You can wear one while you do your normal daily activities. This is usually used to diagnose what is causing palpitations/syncope (passing out).  Your physician has recommended you make the following change in your medication: DECREASE ASPIRIN to 81 mg daily.

## 2010-09-03 NOTE — Assessment & Plan Note (Signed)
Most likely premature beats. Will have him wear a 48 hour Holter monitor.

## 2010-09-03 NOTE — Telephone Encounter (Signed)
Sent rf to [facility]

## 2010-09-03 NOTE — Assessment & Plan Note (Signed)
Stable. Continue ASA and Effient until at least march 2013. Will reduce ASA to 81 mg per day. Continue beta blocker and statin.

## 2010-09-05 ENCOUNTER — Encounter (HOSPITAL_COMMUNITY): Payer: BC Managed Care – PPO

## 2010-09-08 ENCOUNTER — Encounter (HOSPITAL_COMMUNITY): Payer: BC Managed Care – PPO

## 2010-09-10 ENCOUNTER — Other Ambulatory Visit: Payer: BC Managed Care – PPO

## 2010-09-10 ENCOUNTER — Encounter (HOSPITAL_COMMUNITY): Payer: BC Managed Care – PPO

## 2010-09-10 ENCOUNTER — Encounter (INDEPENDENT_AMBULATORY_CARE_PROVIDER_SITE_OTHER): Payer: BC Managed Care – PPO

## 2010-09-10 DIAGNOSIS — R002 Palpitations: Secondary | ICD-10-CM

## 2010-09-12 ENCOUNTER — Encounter (HOSPITAL_COMMUNITY): Payer: BC Managed Care – PPO

## 2010-09-15 ENCOUNTER — Ambulatory Visit (HOSPITAL_COMMUNITY): Payer: BC Managed Care – PPO

## 2010-09-16 ENCOUNTER — Other Ambulatory Visit (HOSPITAL_COMMUNITY)
Admission: RE | Admit: 2010-09-16 | Discharge: 2010-09-16 | Disposition: A | Discharge status: Home / Self Care | Payer: BC Managed Care – PPO | Source: Ambulatory Visit | Attending: Interventional Radiology | Admitting: Interventional Radiology

## 2010-09-16 ENCOUNTER — Ambulatory Visit
Admission: RE | Admit: 2010-09-16 | Discharge: 2010-09-16 | Disposition: A | Discharge status: Home / Self Care | Payer: BC Managed Care – PPO | Source: Ambulatory Visit | Attending: Family Medicine | Admitting: Family Medicine

## 2010-09-16 ENCOUNTER — Other Ambulatory Visit: Payer: Self-pay | Admitting: Interventional Radiology

## 2010-09-16 DIAGNOSIS — E049 Nontoxic goiter, unspecified: Secondary | ICD-10-CM | POA: Insufficient documentation

## 2010-09-16 DIAGNOSIS — E041 Nontoxic single thyroid nodule: Secondary | ICD-10-CM

## 2010-09-17 ENCOUNTER — Ambulatory Visit (HOSPITAL_COMMUNITY): Payer: BC Managed Care – PPO

## 2010-09-19 ENCOUNTER — Ambulatory Visit (HOSPITAL_COMMUNITY): Payer: BC Managed Care – PPO

## 2010-09-19 NOTE — Progress Notes (Signed)
Quick Note:  Report found under labs, printed for yoy ______

## 2010-09-22 ENCOUNTER — Ambulatory Visit (HOSPITAL_COMMUNITY): Payer: BC Managed Care – PPO

## 2010-09-24 ENCOUNTER — Ambulatory Visit (HOSPITAL_COMMUNITY): Payer: BC Managed Care – PPO

## 2010-09-25 ENCOUNTER — Telehealth: Payer: Self-pay | Admitting: *Deleted

## 2010-09-25 NOTE — Telephone Encounter (Signed)
Spoke with pt and gave him results of 48 hour monitor.

## 2010-09-26 ENCOUNTER — Ambulatory Visit (HOSPITAL_COMMUNITY): Payer: BC Managed Care – PPO

## 2011-01-07 ENCOUNTER — Other Ambulatory Visit: Payer: Self-pay | Admitting: Family Medicine

## 2011-01-08 NOTE — Telephone Encounter (Signed)
Please advise 

## 2011-01-08 NOTE — Telephone Encounter (Signed)
Is pt has recurrent sinusitis symptoms. If so, may refill Zithromax once but this should not be to [facility].

## 2011-02-23 ENCOUNTER — Encounter: Payer: Self-pay | Admitting: Cardiovascular Disease

## 2011-02-23 ENCOUNTER — Ambulatory Visit (INDEPENDENT_AMBULATORY_CARE_PROVIDER_SITE_OTHER): Payer: BC Managed Care – PPO | Admitting: Cardiovascular Disease

## 2011-02-23 VITALS — BP 137/74 | HR 73 | Ht 70.0 in | Wt 255.0 lb

## 2011-02-23 DIAGNOSIS — I2510 Atherosclerotic heart disease of native coronary artery without angina pectoris: Secondary | ICD-10-CM

## 2011-02-23 MED ORDER — PRASUGREL HCL 10 MG PO TABS: 10.0000 mg | ORAL_TABLET | Freq: Every day | ORAL | Status: DC

## 2011-02-23 MED ORDER — LISINOPRIL-HYDROCHLOROTHIAZIDE 20-12.5 MG PO TABS: 1.0000 | ORAL_TABLET | Freq: Every day | ORAL | Status: DC

## 2011-02-23 MED ORDER — METOPROLOL SUCCINATE ER 25 MG PO TB24: 25.0000 mg | ORAL_TABLET | Freq: Every day | ORAL | Status: DC

## 2011-02-23 NOTE — Progress Notes (Signed)
History of Present Illness: 60 y.o. male with a history of CAD, DM, HTN, hyperlipidemia who presented to the hospital on 05/14/10 with chest pain. MI was ruled out. Myoview study was positive for anteroseptal ischemia and EF of 52%. Cardiac catheterization demonstrated single vessel CAD with a mid LAD stenosis of 90%. Dr. Kirke Corin performed his cath. This was treated with a 3.0 x 16 mm Promus Element  DES. He was placed on aspirin and Effient. I saw him for the first time in the office in June 2012. He described awarenss of skipped beats at the initial visit. I had him wear a 48 hour Holter monitor. This showed rare PVCs and PACs.   He is here today for follow up. He has had no chest pain. He does describe dyspnea with exertion. This is minimal. No palpitations, near syncope or syncope.   His primary care is Dr. Caryl Never.    Past Medical History  Diagnosis Date  . DIABETES MELLITUS, TYPE II 06/08/2008  . HYPERLIPIDEMIA 06/08/2008  . HYPERTENSION 06/08/2008  . ASTHMA 06/08/2008  . Impotence of organic origin 08/24/2008  . Chronic kidney disease   . Hemorrhoids   . Colonic polyp   . CAD (coronary artery disease)     s/p Promus DES (3.0 x 16 mm) to mid LAD 05/16/10;  Myoview 05/15/10: anteroseptal ischemia with EF 52%;  Cath 05/16/10: single vessel CAD with mLAD 90% tx with PCI and EF 50%    No past surgical history on file.  Current Outpatient Prescriptions  Medication Sig Dispense Refill  . Ascorbic Acid (VITAMIN C) 100 MG tablet Take 100 mg by mouth daily.        Marland Kitchen aspirin 81 MG tablet Take 1 tablet (81 mg total) by mouth daily.  30 tablet  0  . azithromycin (ZITHROMAX) 250 MG tablet TAKE 2 TABLETS ON DAY 1 FOLLOWED BY 1 TABLET DAILY FOR 4 DAYS  1 tablet  0  . BD ULTRA-FINE LANCETS lancets by Other route 2 (two) times daily. Use as instructed       . Biotin 10 MG TABS Take by mouth.        . Cholecalciferol (VITAMIN D) 2000 UNITS CAPS Take by mouth.        . fish oil-omega-3 fatty acids 1000 MG  capsule Take 1 g by mouth daily.       . fluticasone (FLONASE) 50 MCG/ACT nasal spray 2 sprays by Nasal route daily.        . insulin aspart (NOVOLOG) 100 UNIT/ML injection Pump 40.2 units per day continuous, basal rate Bolus rate 3.0 to 11.5       . lisinopril-hydrochlorothiazide (PRINZIDE,ZESTORETIC) 20-12.5 MG per tablet Take 1 tablet by mouth daily.  90 tablet  0  . metFORMIN (GLUCOPHAGE) 500 MG tablet Take 2 tabs in the morning and 2 tabs in the evening  360 tablet  2  . metoprolol succinate (TOPROL-XL) 25 MG 24 hr tablet Take 1 tablet (25 mg total) by mouth daily.  90 tablet  0  . mometasone (NASONEX) 50 MCG/ACT nasal spray 2 sprays by Nasal route daily.        . Multiple Vitamins-Minerals (ICAPS MV) TABS Take by mouth daily.        . nitroGLYCERIN (NITROSTAT) 0.4 MG SL tablet Place 0.4 mg under the tongue every 5 (five) minutes as needed.        . NON FORMULARY Novolog 100 units/ml solution insulin pump       .  prasugrel (EFFIENT) 10 MG TABS Take 1 tablet (10 mg total) by mouth daily.  90 tablet  0  . simvastatin (ZOCOR) 20 MG tablet Take 1 tablet (20 mg total) by mouth at bedtime.  90 tablet  3  . tadalafil (CIALIS) 20 MG tablet Take 1 tablet (20 mg total) by mouth daily as needed for erectile dysfunction. Every other day as needed  18 tablet  3  . vitamin E 400 UNIT capsule Take 400 Units by mouth daily.          No Known Allergies  History   Social History  . Marital Status: Married    Spouse Name: N/A    Number of Children: N/A  . Years of Education: N/A   Occupational History  . Not on file.   Social History Main Topics  . Smoking status: Former Smoker -- 1.0 packs/day for 35 years    Types: Cigarettes    Quit date: 05/23/2002  . Smokeless tobacco: Not on file  . Alcohol Use: Not on file  . Drug Use: Not on file  . Sexually Active: Not on file   Other Topics Concern  . Not on file   Social History Narrative  . No narrative on file    Family History  Problem  Relation Age of Onset  . Diabetes Father   . Heart disease Father     Review of Systems:  As stated in the HPI and otherwise negative.   BP 137/74  Pulse 73  Ht 5\' 10"  (1.778 m)  Wt 255 lb (115.667 kg)  BMI 36.59 kg/m2  Physical Examination: General: Well developed, well nourished, NAD HEENT: OP clear, mucus membranes moist SKIN: warm, dry. No rashes. Neuro: No focal deficits Musculoskeletal: Muscle strength 5/5 all ext Psychiatric: Mood and affect normal Neck: No JVD, no carotid bruits, no thyromegaly, no lymphadenopathy. Lungs:Clear bilaterally, no wheezes, rhonci, crackles Cardiovascular: Regular rate and rhythm. No murmurs, gallops or rubs. Abdomen:Soft. Bowel sounds present. Non-tender.  Extremities: No lower extremity edema. Pulses are 2 + in the bilateral DP/PT.

## 2011-02-23 NOTE — Assessment & Plan Note (Signed)
Stable. Lipids and BP are at goal. No changes.

## 2011-02-23 NOTE — Patient Instructions (Signed)
Your physician wants you to follow-up in: 6 months  You will receive a reminder letter in the mail two months in advance. If you don't receive a letter, please call our office to schedule the follow-up appointment.  Your physician recommends that you continue on your current medications as directed. Please refer to the Current Medication list given to you today.  

## 2011-05-04 ENCOUNTER — Other Ambulatory Visit: Payer: Self-pay | Admitting: Physician Assistant

## 2011-05-27 ENCOUNTER — Other Ambulatory Visit: Payer: Self-pay | Admitting: Family Medicine

## 2011-07-29 ENCOUNTER — Observation Stay (HOSPITAL_COMMUNITY)
Admission: AD | Admit: 2011-07-29 | Discharge: 2011-07-30 | DRG: 125 | Disposition: A | Discharge status: Home / Self Care | Payer: BC Managed Care – PPO | Source: Ambulatory Visit | Attending: Internal Medicine | Admitting: Internal Medicine

## 2011-07-29 ENCOUNTER — Ambulatory Visit (INDEPENDENT_AMBULATORY_CARE_PROVIDER_SITE_OTHER): Payer: BC Managed Care – PPO | Admitting: Physician Assistant

## 2011-07-29 ENCOUNTER — Encounter (HOSPITAL_COMMUNITY)
Admission: AD | Disposition: A | Discharge status: Home / Self Care | Payer: Self-pay | Source: Ambulatory Visit | Attending: Internal Medicine

## 2011-07-29 ENCOUNTER — Encounter: Payer: Self-pay | Admitting: Physician Assistant

## 2011-07-29 ENCOUNTER — Encounter (HOSPITAL_COMMUNITY): Payer: Self-pay | Admitting: General Practice

## 2011-07-29 VITALS — BP 103/64 | HR 69 | Ht 70.0 in | Wt 257.0 lb

## 2011-07-29 DIAGNOSIS — R002 Palpitations: Secondary | ICD-10-CM

## 2011-07-29 DIAGNOSIS — I10 Essential (primary) hypertension: Secondary | ICD-10-CM | POA: Insufficient documentation

## 2011-07-29 DIAGNOSIS — E119 Type 2 diabetes mellitus without complications: Secondary | ICD-10-CM | POA: Insufficient documentation

## 2011-07-29 DIAGNOSIS — Z9641 Presence of insulin pump (external) (internal): Secondary | ICD-10-CM | POA: Insufficient documentation

## 2011-07-29 DIAGNOSIS — I129 Hypertensive chronic kidney disease with stage 1 through stage 4 chronic kidney disease, or unspecified chronic kidney disease: Secondary | ICD-10-CM | POA: Insufficient documentation

## 2011-07-29 DIAGNOSIS — Z79899 Other long term (current) drug therapy: Secondary | ICD-10-CM | POA: Insufficient documentation

## 2011-07-29 DIAGNOSIS — I2510 Atherosclerotic heart disease of native coronary artery without angina pectoris: Secondary | ICD-10-CM | POA: Insufficient documentation

## 2011-07-29 DIAGNOSIS — R0609 Other forms of dyspnea: Secondary | ICD-10-CM | POA: Insufficient documentation

## 2011-07-29 DIAGNOSIS — Z9861 Coronary angioplasty status: Secondary | ICD-10-CM | POA: Insufficient documentation

## 2011-07-29 DIAGNOSIS — R0989 Other specified symptoms and signs involving the circulatory and respiratory systems: Secondary | ICD-10-CM | POA: Insufficient documentation

## 2011-07-29 DIAGNOSIS — N189 Chronic kidney disease, unspecified: Secondary | ICD-10-CM | POA: Insufficient documentation

## 2011-07-29 DIAGNOSIS — E785 Hyperlipidemia, unspecified: Secondary | ICD-10-CM | POA: Insufficient documentation

## 2011-07-29 DIAGNOSIS — R079 Chest pain, unspecified: Secondary | ICD-10-CM

## 2011-07-29 DIAGNOSIS — J45909 Unspecified asthma, uncomplicated: Secondary | ICD-10-CM | POA: Insufficient documentation

## 2011-07-29 DIAGNOSIS — R0789 Other chest pain: Principal | ICD-10-CM

## 2011-07-29 HISTORY — DX: Pneumonia, unspecified organism: J18.9

## 2011-07-29 HISTORY — PX: LEFT HEART CATHETERIZATION WITH CORONARY ANGIOGRAM: SHX5451

## 2011-07-29 HISTORY — DX: Gastro-esophageal reflux disease without esophagitis: K21.9

## 2011-07-29 HISTORY — DX: Acute myocardial infarction, unspecified: I21.9

## 2011-07-29 LAB — PROTIME-INR
INR: 1.08 (ref 0.00–1.49)
Prothrombin Time: 14.2 seconds (ref 11.6–15.2)

## 2011-07-29 LAB — PLATELET INHIBITION P2Y12: Platelet Function  P2Y12: 259 [PRU] (ref 194–418)

## 2011-07-29 LAB — BASIC METABOLIC PANEL
Calcium: 9.5 mg/dL (ref 8.4–10.5)
Chloride: 102 mEq/L (ref 96–112)
Creatinine, Ser: 0.69 mg/dL (ref 0.50–1.35)
GFR calc Af Amer: >90 mL/min (ref >90)
Sodium: 138 mEq/L (ref 135–145)

## 2011-07-29 LAB — CBC
MCH: 29.4 pg (ref 26.0–34.0)
MCHC: 35.2 g/dL (ref 30.0–36.0)
Platelets: 174 10*3/uL (ref 150–400)

## 2011-07-29 LAB — GLUCOSE, CAPILLARY
Glucose-Capillary: 148 mg/dL — ABNORMAL HIGH (ref 70–99)
Glucose-Capillary: 107 mg/dL — ABNORMAL HIGH (ref 70–99)

## 2011-07-29 SURGERY — LEFT HEART CATHETERIZATION WITH CORONARY ANGIOGRAM
Anesthesia: LOCAL

## 2011-07-29 MED ORDER — SODIUM CHLORIDE 0.9 % IV SOLN: 250.0000 mL | INTRAVENOUS | Status: DC | PRN

## 2011-07-29 MED ORDER — FLUTICASONE PROPIONATE 50 MCG/ACT NA SUSP
2.0000 | Freq: Every day | NASAL | Status: DC | PRN
  Filled 2011-07-29: qty 16

## 2011-07-29 MED ORDER — INSULIN PUMP
Freq: Three times a day (TID) | SUBCUTANEOUS | Status: DC
  Administered 2011-07-29: 10 via SUBCUTANEOUS
  Administered 2011-07-30: 07:00:00 via SUBCUTANEOUS
  Filled 2011-07-29: qty 1

## 2011-07-29 MED ORDER — SODIUM CHLORIDE 0.9 % IJ SOLN
3.0000 mL | Freq: Two times a day (BID) | INTRAMUSCULAR | Status: DC
  Administered 2011-07-29: 3 mL via INTRAVENOUS

## 2011-07-29 MED ORDER — ASPIRIN 81 MG PO CHEW: 324.0000 mg | CHEWABLE_TABLET | ORAL | Status: DC

## 2011-07-29 MED ORDER — METOPROLOL SUCCINATE ER 25 MG PO TB24
25.0000 mg | ORAL_TABLET | Freq: Every day | ORAL | Status: DC
  Filled 2011-07-29 (×3): qty 1

## 2011-07-29 MED ORDER — ASPIRIN 325 MG PO TABS
81.0000 mg | ORAL_TABLET | Freq: Every day | ORAL | Status: DC
  Filled 2011-07-29: qty 0.5

## 2011-07-29 MED ORDER — SIMVASTATIN 20 MG PO TABS
20.0000 mg | ORAL_TABLET | Freq: Every day | ORAL | Status: DC
  Administered 2011-07-29: 20 mg via ORAL
  Filled 2011-07-29 (×2): qty 1

## 2011-07-29 MED ORDER — HEPARIN (PORCINE) IN NACL 2-0.9 UNIT/ML-% IJ SOLN
INTRAMUSCULAR | Status: AC
  Filled 2011-07-29: qty 2000

## 2011-07-29 MED ORDER — SODIUM CHLORIDE 0.9 % IV SOLN
INTRAVENOUS | Status: DC
  Administered 2011-07-29: 11:00:00 via INTRAVENOUS

## 2011-07-29 MED ORDER — CLOPIDOGREL BISULFATE 75 MG PO TABS
75.0000 mg | ORAL_TABLET | ORAL | Status: AC
  Administered 2011-07-29: 75 mg via ORAL
  Filled 2011-07-29: qty 1

## 2011-07-29 MED ORDER — SODIUM CHLORIDE 0.9 % IV SOLN
INTRAVENOUS | Status: AC
  Administered 2011-07-29: 19:00:00 via INTRAVENOUS

## 2011-07-29 MED ORDER — MIDAZOLAM HCL 2 MG/2ML IJ SOLN
INTRAMUSCULAR | Status: AC
  Filled 2011-07-29: qty 2

## 2011-07-29 MED ORDER — HYDROCHLOROTHIAZIDE 12.5 MG PO CAPS
12.5000 mg | ORAL_CAPSULE | Freq: Every day | ORAL | Status: DC
  Filled 2011-07-29: qty 1

## 2011-07-29 MED ORDER — LISINOPRIL 20 MG PO TABS
20.0000 mg | ORAL_TABLET | Freq: Every day | ORAL | Status: DC
  Filled 2011-07-29: qty 1

## 2011-07-29 MED ORDER — ONDANSETRON HCL 4 MG/2ML IJ SOLN: 4.0000 mg | Freq: Four times a day (QID) | INTRAMUSCULAR | Status: DC | PRN

## 2011-07-29 MED ORDER — LISINOPRIL-HYDROCHLOROTHIAZIDE 20-12.5 MG PO TABS: 1.0000 | ORAL_TABLET | Freq: Every day | ORAL | Status: DC

## 2011-07-29 MED ORDER — ASPIRIN 325 MG PO TABS: 81.0000 mg | ORAL_TABLET | Freq: Every day | ORAL | Status: DC

## 2011-07-29 MED ORDER — NITROGLYCERIN 0.2 MG/ML ON CALL CATH LAB
INTRAVENOUS | Status: AC
  Filled 2011-07-29: qty 1

## 2011-07-29 MED ORDER — SODIUM CHLORIDE 0.9 % IJ SOLN: 3.0000 mL | INTRAMUSCULAR | Status: DC | PRN

## 2011-07-29 MED ORDER — ASPIRIN EC 81 MG PO TBEC
162.0000 mg | DELAYED_RELEASE_TABLET | Freq: Every day | ORAL | Status: DC
  Filled 2011-07-29: qty 2

## 2011-07-29 MED ORDER — ACETAMINOPHEN 325 MG PO TABS: 650.0000 mg | ORAL_TABLET | ORAL | Status: DC | PRN

## 2011-07-29 MED ORDER — LIDOCAINE HCL (PF) 1 % IJ SOLN
INTRAMUSCULAR | Status: AC
  Filled 2011-07-29: qty 30

## 2011-07-29 MED ORDER — FENTANYL CITRATE 0.05 MG/ML IJ SOLN
INTRAMUSCULAR | Status: AC
  Filled 2011-07-29: qty 2

## 2011-07-29 NOTE — H&P (Signed)
HPI: This is a 61 year old white male patient who has history of coronary artery disease status post drug-eluting stent to the LAD in March 2012. He was placed on aspirin and Effient. He ran out of his Effient approximately 3 weeks ago. Since then he is having exertional dyspnea and left arm pain. This occurs with moderate activity such as walking, lifting, or going up stairs. The pain is arm usually lasts about 30 minutes before diseases. He is in the same symptoms that he presented with when he had his catheter. He has never had any chest pain. He states that he is having left arm pain in the office currently after walking in here. He also had some pain this morning when he walked in to work.   He has insulin-dependent diabetic he gives mellitus treated with insulin pump, hypertension, hyperlipidemia, and chronic kidney disease although last creatinine was 0.7 in June 2012. GFR was greater than 60.   No Known Allergies    Current Outpatient Prescriptions on File Prior to Visit   Medication  Sig  Dispense  Refill   .  Ascorbic Acid (VITAMIN C) 100 MG tablet  Take 100 mg by mouth daily.           Marland Kitchen  aspirin 81 MG tablet  Take 1 tablet (81 mg total) by mouth daily.   30 tablet   0   .  BD ULTRA-FINE LANCETS lancets  by Other route 2 (two) times daily. Use as instructed          .  Biotin 10 MG TABS  Take by mouth.           .  Cholecalciferol (VITAMIN D) 2000 UNITS CAPS  Take by mouth.           .  fish oil-omega-3 fatty acids 1000 MG capsule  Take 1 g by mouth daily.          .  fluticasone (FLONASE) 50 MCG/ACT nasal spray  2 sprays by Nasal route daily.           .  insulin aspart (NOVOLOG) 100 UNIT/ML injection  Pump 40.2 units per day continuous, basal rate  Bolus rate 3.0 to 11.5          .  lisinopril-hydrochlorothiazide (PRINZIDE,ZESTORETIC) 20-12.5 MG per tablet  TAKE 1 TABLET DAILY   90 tablet   2   .  metFORMIN (GLUCOPHAGE) 500 MG tablet  Take 2 tabs in the morning and 2 tabs in the  evening   360 tablet   2   .  metoprolol succinate (TOPROL-XL) 25 MG 24 hr tablet  TAKE 1 TABLET DAILY   90 tablet   2   .  mometasone (NASONEX) 50 MCG/ACT nasal spray  2 sprays by Nasal route daily.           .  Multiple Vitamins-Minerals (ICAPS MV) TABS  Take by mouth daily.           .  nitroGLYCERIN (NITROSTAT) 0.4 MG SL tablet  Place 0.4 mg under the tongue every 5 (five) minutes as needed.           .  NON FORMULARY  Novolog 100 units/ml solution insulin pump          .  simvastatin (ZOCOR) 20 MG tablet  TAKE 1 TABLET DAILY   90 tablet   0   .  tadalafil (CIALIS) 20 MG tablet  Take 1 tablet (20 mg total) by mouth  daily as needed for erectile dysfunction. Every other day as needed   18 tablet   3   .  vitamin E 400 UNIT capsule  Take 400 Units by mouth daily.               Past Medical History   Diagnosis  Date   .  DIABETES MELLITUS, TYPE II  06/08/2008   .  HYPERLIPIDEMIA  06/08/2008   .  HYPERTENSION  06/08/2008   .  ASTHMA  06/08/2008   .  Impotence of organic origin  08/24/2008   .  Chronic kidney disease     .  Hemorrhoids     .  Colonic polyp     .  CAD (coronary artery disease)         s/p Promus DES (3.0 x 16 mm) to mid LAD 05/16/10;  Myoview 05/15/10: anteroseptal ischemia with EF 52%;  Cath 05/16/10: single vessel CAD with mLAD 90% tx with PCI and EF 50%      No past surgical history on file.    Family History   Problem  Relation  Age of Onset   .  Diabetes  Father     .  Heart disease  Father         History       Social History   .  Marital Status:  Married       Spouse Name:  N/A       Number of Children:  N/A   .  Years of Education:  N/A       Occupational History   .  Not on file.       Social History Main Topics   .  Smoking status:  Former Smoker -- 1.0 packs/day for 35 years       Types:  Cigarettes       Quit date:  05/23/2002   .  Smokeless tobacco:  Not on file   .  Alcohol Use:  Not on file   .  Drug Use:  Not on file   .  Sexually Active:  Not  on file       Other Topics  Concern   .  Not on file       Social History Narrative   .  No narrative on file      ROS: See HPI Eyes: Negative Ears:Negative for hearing loss, tinnitus Cardiovascular:Positive for palpitations, Negative for chest pain, irregular heartbeat,  near-syncope, orthopnea, paroxysmal nocturnal dyspnea and syncope,edema, claudication, cyanosis,.  Respiratory:   Negative for cough, hemoptysis, shortness of breath, sleep disturbances due to breathing, sputum production and wheezing.   Endocrine: Negative for cold intolerance and heat intolerance.  Hematologic/Lymphatic: Negative for adenopathy and bleeding problem. Does not bruise/bleed easily.  Musculoskeletal: Negative.   Gastrointestinal: Negative for nausea, vomiting, reflux, abdominal pain, diarrhea, constipation.   Neurological: Negative.   Allergic/Immunologic: Negative for environmental allergies.     PHYSICAL EXAM: Obese, in no acute distress. HEENT:Head is normocephalic without sign of trauma, extraocular eye movements are intact, pupils were equal and reactive to light accommodation, nasal mucosa is moist, throat is without erythema or exudate  Neck: No JVD, HJR, Bruit, or thyroid enlargement  Lungs: No tachypnea, clear without wheezing, rales, or rhonchi  Cardiovascular: RRR, PMI not displaced, heart sounds normal, no murmurs, gallops, bruit, thrill, or heave.  Abdomen: BS normal. Soft without organomegaly, masses, lesions or tenderness.  Extremities: without cyanosis, clubbing or edema. Good distal  pulses bilateral  SKin: Warm, no lesions or rashes  Musculoskeletal: No deformities  Neuro: no focal signs   BP 103/64  Pulse 69  Ht 5\' 10"  (1.778 m)  Wt 257 lb (116.574 kg)  BMI 36.88 kg/m2   WUJ:[id number] sinus rhythm with incomplete right bundle branch block no acute change         Julieta Gutting, RN  07/29/2011 10:12 AM  Signed 7829 07/29/11 Pt given Chewable ASA 81mg  (4 tablets) and 1  SL NTG due to 4/10 left elbow pain.  BP 160/78--Reassessed pain at 0945 and pt did not notice any difference in arm pain.     CAD (coronary artery disease) - Jacolyn Reedy, PA  07/29/2011  9:30 AM  Signed Patient has coronary artery disease status post promise element drug-eluting stent to the LAD in March 2012 by Dr. Kirke Corin. He ran out of this at the end 3 weeks ago which time he developed dyspnea on exertion and left arm pain. The symptoms are the same as when he had his stent placed. He is to be readmitted today and have a cardiac catheterization later on. He has been examined by Dr. Johney Frame here in the office. He will have a cardiac catheterization by Dr. Clifton Chelesea Weiand.  HYPERTENSION - Jacolyn Reedy, PA  07/29/2011  9:31 AM  Signed Blood pressure normal  DIABETES MELLITUS, TYPE II - Jacolyn Reedy, PA  07/29/2011  9:31 AM  Signed Patient has an insulin pump  Palpitations - Jacolyn Reedy, PA  07/29/2011  9:32 AM  Signed Patient has occasional palpitations but they're stable.     Electronic signature on 07/29/2011    I have seen, examined the patient, and reviewed the above assessment and plan.  Changes made to above where needed.  Briefly, pt with dypsnea and arm pain consistent with his prior anginal equivalent.  Recently has stopped effient.  I am concerned for unstable angina and will admit for cath. Risks, benefits, and alternatives to cath were discussed at length with the patient who wishes to proceed.  Co Sign: Hillis Range, MD 07/30/2011 4:11 PM

## 2011-07-29 NOTE — Progress Notes (Signed)
TR BAND REMOVAL  LOCATION:    right radial  DEFLATED PER PROTOCOL:    yes  TIME BAND OFF / DRESSING APPLIED:    21:30   SITE UPON ARRIVAL:    Level 0  SITE AFTER BAND REMOVAL:    Level 0  REVERSE ALLEN'S TEST:     positive  CIRCULATION SENSATION AND MOVEMENT:    Within Normal Limits   yes  COMMENTS:    

## 2011-07-29 NOTE — Plan of Care (Signed)
Problem: Consults Goal: Diabetes Guidelines if Diabetic/Glucose > 140 If diabetic or lab glucose is > 140 mg/dl - Initiate Diabetes/Hyperglycemia Guidelines & Document Interventions  Outcome: Completed/Met Date Met:  07/29/11 Patient with insulin pump, Diabetes Coordinator contacted  Problem: Phase I Progression Outcomes Goal: Aspirin unless contraindicated Outcome: Completed/Met Date Met:  07/29/11 324 ASA given in MD office this am.  Problem: Consults Goal: Cardiac Cath Patient Education (See Patient Education module for education specifics.)  Outcome: Completed/Met Date Met:  07/29/11 Cardiac cath video seen.

## 2011-07-29 NOTE — H&P (View-Only) (Signed)
HPI: This is a 61 year old white male patient who has history of coronary artery disease status post drug-eluting stent to the LAD in March 2012. He was placed on aspirin and Effient. He ran out of his Effient approximately 3 weeks ago. Since then he is having exertional dyspnea and left arm pain. This occurs with moderate activity such as walking, lifting, or going up stairs. The pain is arm usually lasts about 30 minutes before diseases. He is in the same symptoms that he presented with when he had his catheter. He has never had any chest pain. He states that he is having left arm pain in the office currently after walking in here. He also had some pain this morning when he walked in to work.  He has insulin-dependent diabetic he gives mellitus treated with insulin pump, hypertension, hyperlipidemia, and chronic kidney disease although last creatinine was 0.7 in June 2012. GFR was greater than 60.  No Known Allergies  Current Outpatient Prescriptions on File Prior to Visit  Medication Sig Dispense Refill  . Ascorbic Acid (VITAMIN C) 100 MG tablet Take 100 mg by mouth daily.        Marland Kitchen aspirin 81 MG tablet Take 1 tablet (81 mg total) by mouth daily.  30 tablet  0  . BD ULTRA-FINE LANCETS lancets by Other route 2 (two) times daily. Use as instructed       . Biotin 10 MG TABS Take by mouth.        . Cholecalciferol (VITAMIN D) 2000 UNITS CAPS Take by mouth.        . fish oil-omega-3 fatty acids 1000 MG capsule Take 1 g by mouth daily.       . fluticasone (FLONASE) 50 MCG/ACT nasal spray 2 sprays by Nasal route daily.        . insulin aspart (NOVOLOG) 100 UNIT/ML injection Pump 40.2 units per day continuous, basal rate Bolus rate 3.0 to 11.5       . lisinopril-hydrochlorothiazide (PRINZIDE,ZESTORETIC) 20-12.5 MG per tablet TAKE 1 TABLET DAILY  90 tablet  2  . metFORMIN (GLUCOPHAGE) 500 MG tablet Take 2 tabs in the morning and 2 tabs in the evening  360 tablet  2  . metoprolol succinate (TOPROL-XL) 25  MG 24 hr tablet TAKE 1 TABLET DAILY  90 tablet  2  . mometasone (NASONEX) 50 MCG/ACT nasal spray 2 sprays by Nasal route daily.        . Multiple Vitamins-Minerals (ICAPS MV) TABS Take by mouth daily.        . nitroGLYCERIN (NITROSTAT) 0.4 MG SL tablet Place 0.4 mg under the tongue every 5 (five) minutes as needed.        . NON FORMULARY Novolog 100 units/ml solution insulin pump       . simvastatin (ZOCOR) 20 MG tablet TAKE 1 TABLET DAILY  90 tablet  0  . tadalafil (CIALIS) 20 MG tablet Take 1 tablet (20 mg total) by mouth daily as needed for erectile dysfunction. Every other day as needed  18 tablet  3  . vitamin E 400 UNIT capsule Take 400 Units by mouth daily.          Past Medical History  Diagnosis Date  . DIABETES MELLITUS, TYPE II 06/08/2008  . HYPERLIPIDEMIA 06/08/2008  . HYPERTENSION 06/08/2008  . ASTHMA 06/08/2008  . Impotence of organic origin 08/24/2008  . Chronic kidney disease   . Hemorrhoids   . Colonic polyp   . CAD (coronary artery disease)  s/p Promus DES (3.0 x 16 mm) to mid LAD 05/16/10;  Myoview 05/15/10: anteroseptal ischemia with EF 52%;  Cath 05/16/10: single vessel CAD with mLAD 90% tx with PCI and EF 50%    No past surgical history on file.  Family History  Problem Relation Age of Onset  . Diabetes Father   . Heart disease Father     History   Social History  . Marital Status: Married    Spouse Name: N/A    Number of Children: N/A  . Years of Education: N/A   Occupational History  . Not on file.   Social History Main Topics  . Smoking status: Former Smoker -- 1.0 packs/day for 35 years    Types: Cigarettes    Quit date: 05/23/2002  . Smokeless tobacco: Not on file  . Alcohol Use: Not on file  . Drug Use: Not on file  . Sexually Active: Not on file   Other Topics Concern  . Not on file   Social History Narrative  . No narrative on file    ROS: See HPI Eyes: Negative Ears:Negative for hearing loss, tinnitus Cardiovascular:Positive for  palpitations, Negative for chest pain, irregular heartbeat,  near-syncope, orthopnea, paroxysmal nocturnal dyspnea and syncope,edema, claudication, cyanosis,.  Respiratory:   Negative for cough, hemoptysis, shortness of breath, sleep disturbances due to breathing, sputum production and wheezing.   Endocrine: Negative for cold intolerance and heat intolerance.  Hematologic/Lymphatic: Negative for adenopathy and bleeding problem. Does not bruise/bleed easily.  Musculoskeletal: Negative.   Gastrointestinal: Negative for nausea, vomiting, reflux, abdominal pain, diarrhea, constipation.   Neurological: Negative.  Allergic/Immunologic: Negative for environmental allergies.   PHYSICAL EXAM: Obese, in no acute distress. HEENT:Head is normocephalic without sign of trauma, extraocular eye movements are intact, pupils were equal and reactive to light accommodation, nasal mucosa is moist, throat is without erythema or exudate Neck: No JVD, HJR, Bruit, or thyroid enlargement Lungs: No tachypnea, clear without wheezing, rales, or rhonchi Cardiovascular: RRR, PMI not displaced, heart sounds normal, no murmurs, gallops, bruit, thrill, or heave. Abdomen: BS normal. Soft without organomegaly, masses, lesions or tenderness. Extremities: without cyanosis, clubbing or edema. Good distal pulses bilateral SKin: Warm, no lesions or rashes  Musculoskeletal: No deformities Neuro: no focal signs  BP 103/64  Pulse 69  Ht 5\' 10"  (1.778 m)  Wt 257 lb (116.574 kg)  BMI 36.88 kg/m2  ZOX:WRUEAV sinus rhythm with incomplete right bundle branch block no acute change

## 2011-07-29 NOTE — Progress Notes (Signed)
6213 07/29/11 Pt given Chewable ASA 81mg  (4 tablets) and 1 SL NTG due to 4/10 left elbow pain.  BP 160/78--Reassessed pain at 0945 and pt did not notice any difference in arm pain.

## 2011-07-29 NOTE — Progress Notes (Signed)
UR Completed Faysal Fenoglio Graves-Bigelow, RN,BSN [phone]  

## 2011-07-29 NOTE — Assessment & Plan Note (Signed)
Patient has an insulin pump. °

## 2011-07-29 NOTE — Assessment & Plan Note (Signed)
Patient has occasional palpitations but they're stable.

## 2011-07-29 NOTE — Care Management Note (Signed)
    Page 1 of 1   07/29/2011     3:32:27 PM   CARE MANAGEMENT NOTE 07/29/2011  Patient:  DOMINYCK, RESER   Account Number:  [id number]  Date Initiated:  07/29/2011  Documentation initiated by:  GRAVES-BIGELOW,Joda Braatz  Subjective/Objective Assessment:   Pt admitted with Left arm pain. HX CAD, DM. Pt admitted from office. Plan for cath today. Pt had been on effient- and has not had in 3 weeks due to he did not have any more refills.     Action/Plan:   CM did speak to pt. Will f/u post cath for medicaiton assistance if needed.   Anticipated DC Date:  07/31/2011   Anticipated DC Plan:  HOME/SELF CARE      DC Planning Services  CM consult      Choice offered to / List presented to:             Status of service:  Completed, signed off Medicare Important Message given?   (If response is "NO", the following Medicare IM given date fields will be blank) Date Medicare IM given:   Date Additional Medicare IM given:    Discharge Disposition:  HOME/SELF CARE  Per UR Regulation:  Reviewed for med. necessity/level of care/duration of stay  If discussed at Long Length of Stay Meetings, dates discussed:    Comments:  07-29-11 1530 Tomi Bamberger, RN,BSN [phone] CM will continue to monitor.

## 2011-07-29 NOTE — Assessment & Plan Note (Signed)
Patient has coronary artery disease status post promise element drug-eluting stent to the LAD in March 2012 by Dr. Kirke Corin. He ran out of this at the end 3 weeks ago which time he developed dyspnea on exertion and left arm pain. The symptoms are the same as when he had his stent placed. He is to be readmitted today and have a cardiac catheterization later on. He has been examined by Dr. Johney Frame here in the office. He will have a cardiac catheterization by Dr. Clifton James.

## 2011-07-29 NOTE — Progress Notes (Signed)
HPI: This is a 61-year-old white male patient who has history of coronary artery disease status post drug-eluting stent to the LAD in March 2012. He was placed on aspirin and Effient. He ran out of his Effient approximately 3 weeks ago. Since then he is having exertional dyspnea and left arm pain. This occurs with moderate activity such as walking, lifting, or going up stairs. The pain is arm usually lasts about 30 minutes before diseases. He is in the same symptoms that he presented with when he had his catheter. He has never had any chest pain. He states that he is having left arm pain in the office currently after walking in here. He also had some pain this morning when he walked in to work.  He has insulin-dependent diabetic he gives mellitus treated with insulin pump, hypertension, hyperlipidemia, and chronic kidney disease although last creatinine was 0.7 in June 2012. GFR was greater than 60.  No Known Allergies  Current Outpatient Prescriptions on File Prior to Visit  Medication Sig Dispense Refill  . Ascorbic Acid (VITAMIN C) 100 MG tablet Take 100 mg by mouth daily.        . aspirin 81 MG tablet Take 1 tablet (81 mg total) by mouth daily.  30 tablet  0  . BD ULTRA-FINE LANCETS lancets by Other route 2 (two) times daily. Use as instructed       . Biotin 10 MG TABS Take by mouth.        . Cholecalciferol (VITAMIN D) 2000 UNITS CAPS Take by mouth.        . fish oil-omega-3 fatty acids 1000 MG capsule Take 1 g by mouth daily.       . fluticasone (FLONASE) 50 MCG/ACT nasal spray 2 sprays by Nasal route daily.        . insulin aspart (NOVOLOG) 100 UNIT/ML injection Pump 40.2 units per day continuous, basal rate Bolus rate 3.0 to 11.5       . lisinopril-hydrochlorothiazide (PRINZIDE,ZESTORETIC) 20-12.5 MG per tablet TAKE 1 TABLET DAILY  90 tablet  2  . metFORMIN (GLUCOPHAGE) 500 MG tablet Take 2 tabs in the morning and 2 tabs in the evening  360 tablet  2  . metoprolol succinate (TOPROL-XL) 25  MG 24 hr tablet TAKE 1 TABLET DAILY  90 tablet  2  . mometasone (NASONEX) 50 MCG/ACT nasal spray 2 sprays by Nasal route daily.        . Multiple Vitamins-Minerals (ICAPS MV) TABS Take by mouth daily.        . nitroGLYCERIN (NITROSTAT) 0.4 MG SL tablet Place 0.4 mg under the tongue every 5 (five) minutes as needed.        . NON FORMULARY Novolog 100 units/ml solution insulin pump       . simvastatin (ZOCOR) 20 MG tablet TAKE 1 TABLET DAILY  90 tablet  0  . tadalafil (CIALIS) 20 MG tablet Take 1 tablet (20 mg total) by mouth daily as needed for erectile dysfunction. Every other day as needed  18 tablet  3  . vitamin E 400 UNIT capsule Take 400 Units by mouth daily.          Past Medical History  Diagnosis Date  . DIABETES MELLITUS, TYPE II 06/08/2008  . HYPERLIPIDEMIA 06/08/2008  . HYPERTENSION 06/08/2008  . ASTHMA 06/08/2008  . Impotence of organic origin 08/24/2008  . Chronic kidney disease   . Hemorrhoids   . Colonic polyp   . CAD (coronary artery disease)       s/p Promus DES (3.0 x 16 mm) to mid LAD 05/16/10;  Myoview 05/15/10: anteroseptal ischemia with EF 52%;  Cath 05/16/10: single vessel CAD with mLAD 90% tx with PCI and EF 50%    No past surgical history on file.  Family History  Problem Relation Age of Onset  . Diabetes Father   . Heart disease Father     History   Social History  . Marital Status: Married    Spouse Name: N/A    Number of Children: N/A  . Years of Education: N/A   Occupational History  . Not on file.   Social History Main Topics  . Smoking status: Former Smoker -- 1.0 packs/day for 35 years    Types: Cigarettes    Quit date: 05/23/2002  . Smokeless tobacco: Not on file  . Alcohol Use: Not on file  . Drug Use: Not on file  . Sexually Active: Not on file   Other Topics Concern  . Not on file   Social History Narrative  . No narrative on file    ROS: See HPI Eyes: Negative Ears:Negative for hearing loss, tinnitus Cardiovascular:Positive for  palpitations, Negative for chest pain, irregular heartbeat,  near-syncope, orthopnea, paroxysmal nocturnal dyspnea and syncope,edema, claudication, cyanosis,.  Respiratory:   Negative for cough, hemoptysis, shortness of breath, sleep disturbances due to breathing, sputum production and wheezing.   Endocrine: Negative for cold intolerance and heat intolerance.  Hematologic/Lymphatic: Negative for adenopathy and bleeding problem. Does not bruise/bleed easily.  Musculoskeletal: Negative.   Gastrointestinal: Negative for nausea, vomiting, reflux, abdominal pain, diarrhea, constipation.   Neurological: Negative.  Allergic/Immunologic: Negative for environmental allergies.   PHYSICAL EXAM: Obese, in no acute distress. HEENT:Head is normocephalic without sign of trauma, extraocular eye movements are intact, pupils were equal and reactive to light accommodation, nasal mucosa is moist, throat is without erythema or exudate Neck: No JVD, HJR, Bruit, or thyroid enlargement Lungs: No tachypnea, clear without wheezing, rales, or rhonchi Cardiovascular: RRR, PMI not displaced, heart sounds normal, no murmurs, gallops, bruit, thrill, or heave. Abdomen: BS normal. Soft without organomegaly, masses, lesions or tenderness. Extremities: without cyanosis, clubbing or edema. Good distal pulses bilateral SKin: Warm, no lesions or rashes  Musculoskeletal: No deformities Neuro: no focal signs  BP 103/64  Pulse 69  Ht 5' 10" (1.778 m)  Wt 257 lb (116.574 kg)  BMI 36.88 kg/m2  EKG:normal sinus rhythm with incomplete right bundle branch block no acute change    

## 2011-07-29 NOTE — Progress Notes (Signed)
Patient admitted with exertional dyspnea and left arm pain.  Has history of Type 2 diabetes that was diagnosed approximately 4 years ago.  Uses insulin pump to manage his CBGs.  Sees Dr. Talmage Coin (endocrinologist) for diabetes management.  Scheduled for cardiac cath today.  Spoke with patient ~3pm.  Patient has disconnected insulin pump in preparation for cardiac cath (patient cannot wear insulin pump in cardiac cath due to radiation during cardiac cath- Pump manufacturer suggests patient remove insulin pump during cardiac cath to avoid damage to insulin pump due to radiation).  I told patient he could wear his insulin pump down to the cath lab and then take it off right before the procedure, however patient told me he will just take it off now and leave it with his wife.  I believe patient will be ok off insulin pump for a few hours as he has Type 2 diabetes.  Patient showed me his medication list from home with his insulin pump settings listed.  See below for pump settings:  Basal rates: 12 am- 2.2 units/hr 11am- 1.6 units/hr  6pm-  1.8 units/hr  8pm-  1.6 units/hr  Gets 45.4 units total basal insulin on pump per 24 hour period.  Correction factor is 1 unit insulin for every 23 mg/dl above patient's CBG goal  Carbohydrate coverage is 1 unit for every 5 grams of carbohydrates consumed  CBG goals: 8pm  100 mg/dl         16XW  96-045 mg/dl  Patient A&O X3 and appropriate for insulin pump therapy.  Patient refilled the reservoir in his pump today.  Needs to change his insertion site tonight.  Wife to bring insulin pump supplies tonight to hospital.  Will follow.  Ambrose Finland RN, MSN, CDE Diabetes Coordinator Inpatient Diabetes Program [phone]

## 2011-07-29 NOTE — Interval H&P Note (Signed)
History and Physical Interval Note:  07/29/2011 4:31 PM  Joel Williams  has presented today for surgery, with the diagnosis of chest pain  The various methods of treatment have been discussed with the patient and family. After consideration of risks, benefits and other options for treatment, the patient has consented to  Procedure(s) (LRB): LEFT HEART CATHETERIZATION WITH CORONARY ANGIOGRAM (N/A) as a surgical intervention .  The patients' history has been reviewed, patient examined, no change in status, stable for surgery.  I have reviewed the patients' chart and labs.  Questions were answered to the patient's satisfaction.     [name]

## 2011-07-29 NOTE — Assessment & Plan Note (Signed)
Blood pressure normal

## 2011-07-29 NOTE — CV Procedure (Signed)
    Cardiac Catheterization Operative Report  Joel Williams [id number] 5/15/20135:05 PM Kristian Covey, MD, MD  Procedure Performed:  1. Left Heart Catheterization 2. Selective Coronary Angiography 3. Left ventricular angiogram  Operator: Verne Carrow, MD  Arterial access site:  Right radial artery.   Indication:  Chest pain, known CAD with DES placed mid LAD in 2012.                                    Procedure Details: The risks, benefits, complications, treatment options, and expected outcomes were discussed with the patient. The patient and/or family concurred with the proposed plan, giving informed consent. The patient was brought to the cath lab after IV hydration was begun and oral premedication was given. The patient was further sedated with Versed and Fentanyl. The right wrist was assessed with an Allens test which was positive. The right wrist was prepped and draped in a sterile fashion. 1% lidocaine was used for local anesthesia. Using the modified Seldinger access technique, a 5 French sheath was placed in the right radial artery. 1.25 mg Nicardipine was given through the sheath. 5000 units IV heparin was given. Standard diagnostic catheters were used to perform selective coronary angiography. A pigtail catheter was used to perform a left ventricular angiogram. The sheath was removed from the right radial artery and a Terumo hemostasis band was applied at the arteriotomy site on the right wrist.   There were no immediate complications. The patient was taken to the recovery area in stable condition.   Hemodynamic Findings: Central aortic pressure: 107/61 Left ventricular pressure: 107/6/15  Angiographic Findings:  Left main: No obstructive disease.   Left Anterior Descending Artery: Large vessel that courses to the apex. Patent stent mid vessel with no restenosis. Both diagonal branches are patent.   Circumflex Artery: Large caliber vessel with early  intermediate branch followed by two moderate sized OM branches. No obstructive disease noted.   Right Coronary Artery: Small non-dominant vessel with no obstructive disease noted.   Left Ventricular Angiogram: LVEF 60%.   Impression: 1. Single vessel CAD with patent stent mid LAD 2. Non-cardiac chest pain 3. Normal LV systolic function  Recommendations: Continue medical management.        Complications:  None. The patient tolerated the procedure well.

## 2011-07-30 ENCOUNTER — Encounter (HOSPITAL_COMMUNITY): Payer: Self-pay | Admitting: Nurse Practitioner

## 2011-07-30 DIAGNOSIS — R0789 Other chest pain: Secondary | ICD-10-CM

## 2011-07-30 DIAGNOSIS — I2510 Atherosclerotic heart disease of native coronary artery without angina pectoris: Secondary | ICD-10-CM

## 2011-07-30 LAB — GLUCOSE, CAPILLARY: Glucose-Capillary: 256 mg/dL — ABNORMAL HIGH (ref 70–99)

## 2011-07-30 MED ORDER — TADALAFIL 20 MG PO TABS: 20.0000 mg | ORAL_TABLET | Freq: Every day | ORAL | Status: DC | PRN

## 2011-07-30 MED ORDER — METFORMIN HCL 500 MG PO TABS: 1000.0000 mg | ORAL_TABLET | Freq: Two times a day (BID) | ORAL | Status: DC

## 2011-07-30 MED FILL — Nicardipine HCl IV Soln 2.5 MG/ML: INTRAVENOUS | Qty: 1 | Status: AC

## 2011-07-30 NOTE — Discharge Summary (Signed)
Patient ID: Joel Williams,  MRN: [id number], DOB/AGE: 62-Nov-1952 62 y.o.  Admit date: 07/29/2011 Discharge date: 07/30/2011  Primary Care Provider: Kristian Covey, MD Primary Cardiologist: C. Clifton James, MD  Discharge Diagnoses Principal Problem:  *Midsternal chest pain Active Problems:  CAD (coronary artery disease)  DIABETES MELLITUS, TYPE II  HYPERLIPIDEMIA  HYPERTENSION   Allergies No Known Allergies  Procedures  Cardiac Catheterization 07/29/2011  Angiographic Findings:  Left main: No obstructive disease.   Left Anterior Descending Artery: Large vessel that courses to the apex. Patent stent mid vessel with no restenosis. Both diagonal branches are patent.   Circumflex Artery: Large caliber vessel with early intermediate branch followed by two moderate sized OM branches. No obstructive disease noted.   Right Coronary Artery: Small non-dominant vessel with no obstructive disease noted.   Left Ventricular Angiogram: LVEF 60%.  _____________   History of Present Illness  61 y/o male with prior h/o CAD s/p DES to the LAD in 05/2010.  Approximately 3 wks ago, pt ran out of effient and did not seek refill.  Since then, he has noted exertional dyspnea and left arm pain occurring with moderate activity.  He was seen in the office on 5/15 and upon reporting symptoms, decision was made to admit him for further evaluation and diagnostic catheterization.  Hospital Course  Pt underwent cardiac catheterization on 5/15, revealing patent LAD stent and otherwise nonobstructive CAD.  He tolerated procedure well and has been ambulating this AM without recurrent symptoms or limitations.  He will be discharged home today in good condition.  As it has been greater than 12 months since drug-eluting stent placement, we will not resume Effient.  Discharge Vitals Blood pressure 118/64, pulse 67, temperature 97.7 F (36.5 C), temperature source Oral, resp. rate 18, height 5\' 10"  (1.778 m),  weight 257 lb (116.574 kg), SpO2 97.00%.  Filed Weights   07/29/11 1038  Weight: 257 lb (116.574 kg)    Labs  CBC  Basename 07/29/11 1120  WBC 6.2  NEUTROABS --  HGB 14.0  HCT 39.8  MCV 83.6  PLT 174   Basic Metabolic Panel  Basename 07/29/11 1120  NA 138  K 3.7  CL 102  CO2 24  GLUCOSE 146*  BUN 17  CREATININE 0.69  CALCIUM 9.5  MG --  PHOS --   Disposition  Pt is being discharged home today in good condition.  Follow-up Plans & Appointments  Follow-up Information    Follow up with [facility], MD in 6 months. (we'll contact you once schedule is out that far.)    Contact information:   [facility] [address] [place] [address] [phone]       Follow up with Kristian Covey, MD. (as scheduled.)    Contact information:   [address] Christena Flake [facility] [place] [address] [phone]          Discharge Medications  Medication List  As of 07/30/2011  8:41 AM   TAKE these medications         aspirin 81 MG tablet   Take 1 tablet (81 mg total) by mouth daily.      fish oil-omega-3 fatty acids 1000 MG capsule   Take 1 g by mouth daily.      fluticasone 50 MCG/ACT nasal spray   Commonly known as: FLONASE   Place 2 sprays into the nose daily as needed. For pollen allergies      GLUCAGON EMERGENCY 1 MG injection   Generic drug:  glucagon   Inject 1 mg into the vein once as needed. For low blood sugar      ICAPS PO   Take 1 tablet by mouth every evening.      insulin pump 100 unit/ml Soln   Inject into the skin continuous. novolog      lisinopril-hydrochlorothiazide 20-12.5 MG per tablet   Commonly known as: PRINZIDE,ZESTORETIC   Take 1 tablet by mouth daily.      metFORMIN 500 MG tablet - TO RESUME ON 08/01/2011   Commonly known as: GLUCOPHAGE   Take 2 tablets (1,000 mg total) by mouth 2 (two) times daily with a meal.      metoprolol succinate 25 MG 24 hr tablet   Commonly known  as: TOPROL-XL   Take 25 mg by mouth daily.      nitroGLYCERIN 0.4 MG SL tablet   Commonly known as: NITROSTAT   Place 0.4 mg under the tongue every 5 (five) minutes as needed. For chest pain      simvastatin 20 MG tablet   Commonly known as: ZOCOR   Take 20 mg by mouth every evening.      tadalafil 20 MG tablet - ADVISED NOT TO USE NITRATES IF USING.   Commonly known as: CIALIS   Take 1 tablet (20 mg total) by mouth daily as needed. For erectile dysfunction      vitamin C 1000 MG tablet   Take 1,000 mg by mouth every evening.      Vitamin D 2000 UNITS Caps   Take 1 capsule by mouth daily.      vitamin E 400 UNIT capsule   Take 400 Units by mouth every evening.            Outstanding Labs/Studies  None  Duration of Discharge Encounter   Greater than 30 minutes including physician time.  Signed, Nicolasa Ducking NP 07/30/2011, 8:41 AM

## 2011-07-30 NOTE — Progress Notes (Signed)
    SUBJECTIVE: No chest pain or SOB.   BP 134/68  Pulse 70  Temp(Src) 97.9 F (36.6 C) (Oral)  Resp 18  Ht 5\' 10"  (1.778 m)  Wt 257 lb (116.574 kg)  BMI 36.88 kg/m2  SpO2 97%  Intake/Output Summary (Last 24 hours) at 07/30/11 0708 Last data filed at 07/30/11 0530  Gross per 24 hour  Intake    513 ml  Output    875 ml  Net   -362 ml    PHYSICAL EXAM General: Well developed, well nourished, in no acute distress. Alert and oriented x 3.  Psych:  Good affect, responds appropriately Neck: No JVD. No masses noted.  Lungs: Clear bilaterally with no wheezes or rhonci noted.  Heart: RRR with no murmurs noted. Abdomen: Bowel sounds are present. Soft, non-tender.  Extremities: No lower extremity edema.   LABS: Basic Metabolic Panel:  Basename 07/29/11 1120  NA 138  K 3.7  CL 102  CO2 24  GLUCOSE 146*  BUN 17  CREATININE 0.69  CALCIUM 9.5  MG --  PHOS --   CBC:  Basename 07/29/11 1120  WBC 6.2  NEUTROABS --  HGB 14.0  HCT 39.8  MCV 83.6  PLT 174     Current Meds:    . aspirin EC  162 mg Oral Daily  . clopidogrel  75 mg Oral Pre-Cath  . fentaNYL      . heparin      . hydrochlorothiazide  12.5 mg Oral Daily  . insulin pump   Subcutaneous TID AC, HS, 0200  . lidocaine      . lisinopril  20 mg Oral Daily  . metoprolol succinate  25 mg Oral QAC breakfast  . midazolam      . midazolam      . nitroGLYCERIN      . simvastatin  20 mg Oral q1800  . sodium chloride  3 mL Intravenous Q12H  . DISCONTD: aspirin  324 mg Oral Pre-Cath  . DISCONTD: aspirin  324 mg Oral Pre-Cath  . DISCONTD: aspirin  162.5 mg Oral Daily  . DISCONTD: aspirin  162.5 mg Oral Daily  . DISCONTD: lisinopril-hydrochlorothiazide  1 tablet Oral Daily  . DISCONTD: sodium chloride  3 mL Intravenous Q12H     ASSESSMENT AND PLAN:  1. CAD: Admitted with chest pain from office. Cardiac cath yesterday with patent LAD stent and no change in other mild disease. LV function normal. Continue  current medical management. (home meds) Will not restart Effient.   2. Dispo: D/C home today. Follow up with me in 6 months.    Samoria Fedorko  5/16/20137:08 AM

## 2011-07-30 NOTE — Discharge Summary (Signed)
See full note this am. cdm 

## 2011-07-30 NOTE — Discharge Instructions (Signed)

## 2011-08-25 ENCOUNTER — Other Ambulatory Visit: Payer: Self-pay | Admitting: Family Medicine

## 2011-09-07 ENCOUNTER — Telehealth: Payer: Self-pay | Admitting: Family Medicine

## 2011-09-07 DIAGNOSIS — N529 Male erectile dysfunction, unspecified: Secondary | ICD-10-CM

## 2011-09-07 NOTE — Telephone Encounter (Signed)
Yes

## 2011-09-07 NOTE — Telephone Encounter (Signed)
Pt is sch for cpx labs on 10-02-2011. Pt is requesting testosterone level added to lab order. Can I sch?

## 2011-09-07 NOTE — Telephone Encounter (Signed)
I ordered the testosterone

## 2011-09-10 NOTE — Telephone Encounter (Addendum)
Pt is aware left message on cell

## 2011-10-02 ENCOUNTER — Other Ambulatory Visit (INDEPENDENT_AMBULATORY_CARE_PROVIDER_SITE_OTHER): Payer: BC Managed Care – PPO

## 2011-10-02 DIAGNOSIS — Z0000 Encounter for general adult medical examination without abnormal findings: Secondary | ICD-10-CM

## 2011-10-02 LAB — BASIC METABOLIC PANEL
CO2: 29 mEq/L (ref 19–32)
Chloride: 101 mEq/L (ref 96–112)
Creatinine, Ser: 0.9 mg/dL (ref 0.4–1.5)
Glucose, Bld: 178 mg/dL — ABNORMAL HIGH (ref 70–99)

## 2011-10-02 LAB — CBC WITH DIFFERENTIAL/PLATELET
Basophils Relative: 0.8 % (ref 0.0–3.0)
Eosinophils Absolute: 0.1 10*3/uL (ref 0.0–0.7)
Hemoglobin: 14.3 g/dL (ref 13.0–17.0)
MCHC: 33.7 g/dL (ref 30.0–36.0)
MCV: 85.6 fl (ref 78.0–100.0)
Monocytes Absolute: 0.5 10*3/uL (ref 0.1–1.0)
Neutro Abs: 2.4 10*3/uL (ref 1.4–7.7)
Neutrophils Relative %: 48.3 % (ref 43.0–77.0)
RBC: 4.96 Mil/uL (ref 4.22–5.81)

## 2011-10-02 LAB — LIPID PANEL
LDL Cholesterol: 61 mg/dL (ref 0–99)
Total CHOL/HDL Ratio: 4

## 2011-10-02 LAB — HEPATIC FUNCTION PANEL
Alkaline Phosphatase: 52 U/L (ref 39–117)
Bilirubin, Direct: 0.1 mg/dL (ref 0.0–0.3)

## 2011-10-02 LAB — POCT URINALYSIS DIPSTICK
Bilirubin, UA: NEGATIVE
Blood, UA: NEGATIVE
Glucose, UA: NEGATIVE
Ketones, UA: NEGATIVE
Nitrite, UA: NEGATIVE
Spec Grav, UA: 1.02

## 2011-10-02 LAB — TSH: TSH: 0.66 u[IU]/mL (ref 0.35–5.50)

## 2011-10-09 ENCOUNTER — Encounter: Payer: BC Managed Care – PPO | Admitting: Family Medicine

## 2011-10-13 IMAGING — CR DG CHEST 2V
2 series · 2 of 2 positions shown · non-contrast
Comparison: 05/16/2010

CLINICAL DATA: Wheezing, low grade fever

CHEST - 2 VIEW

[w chest pa]
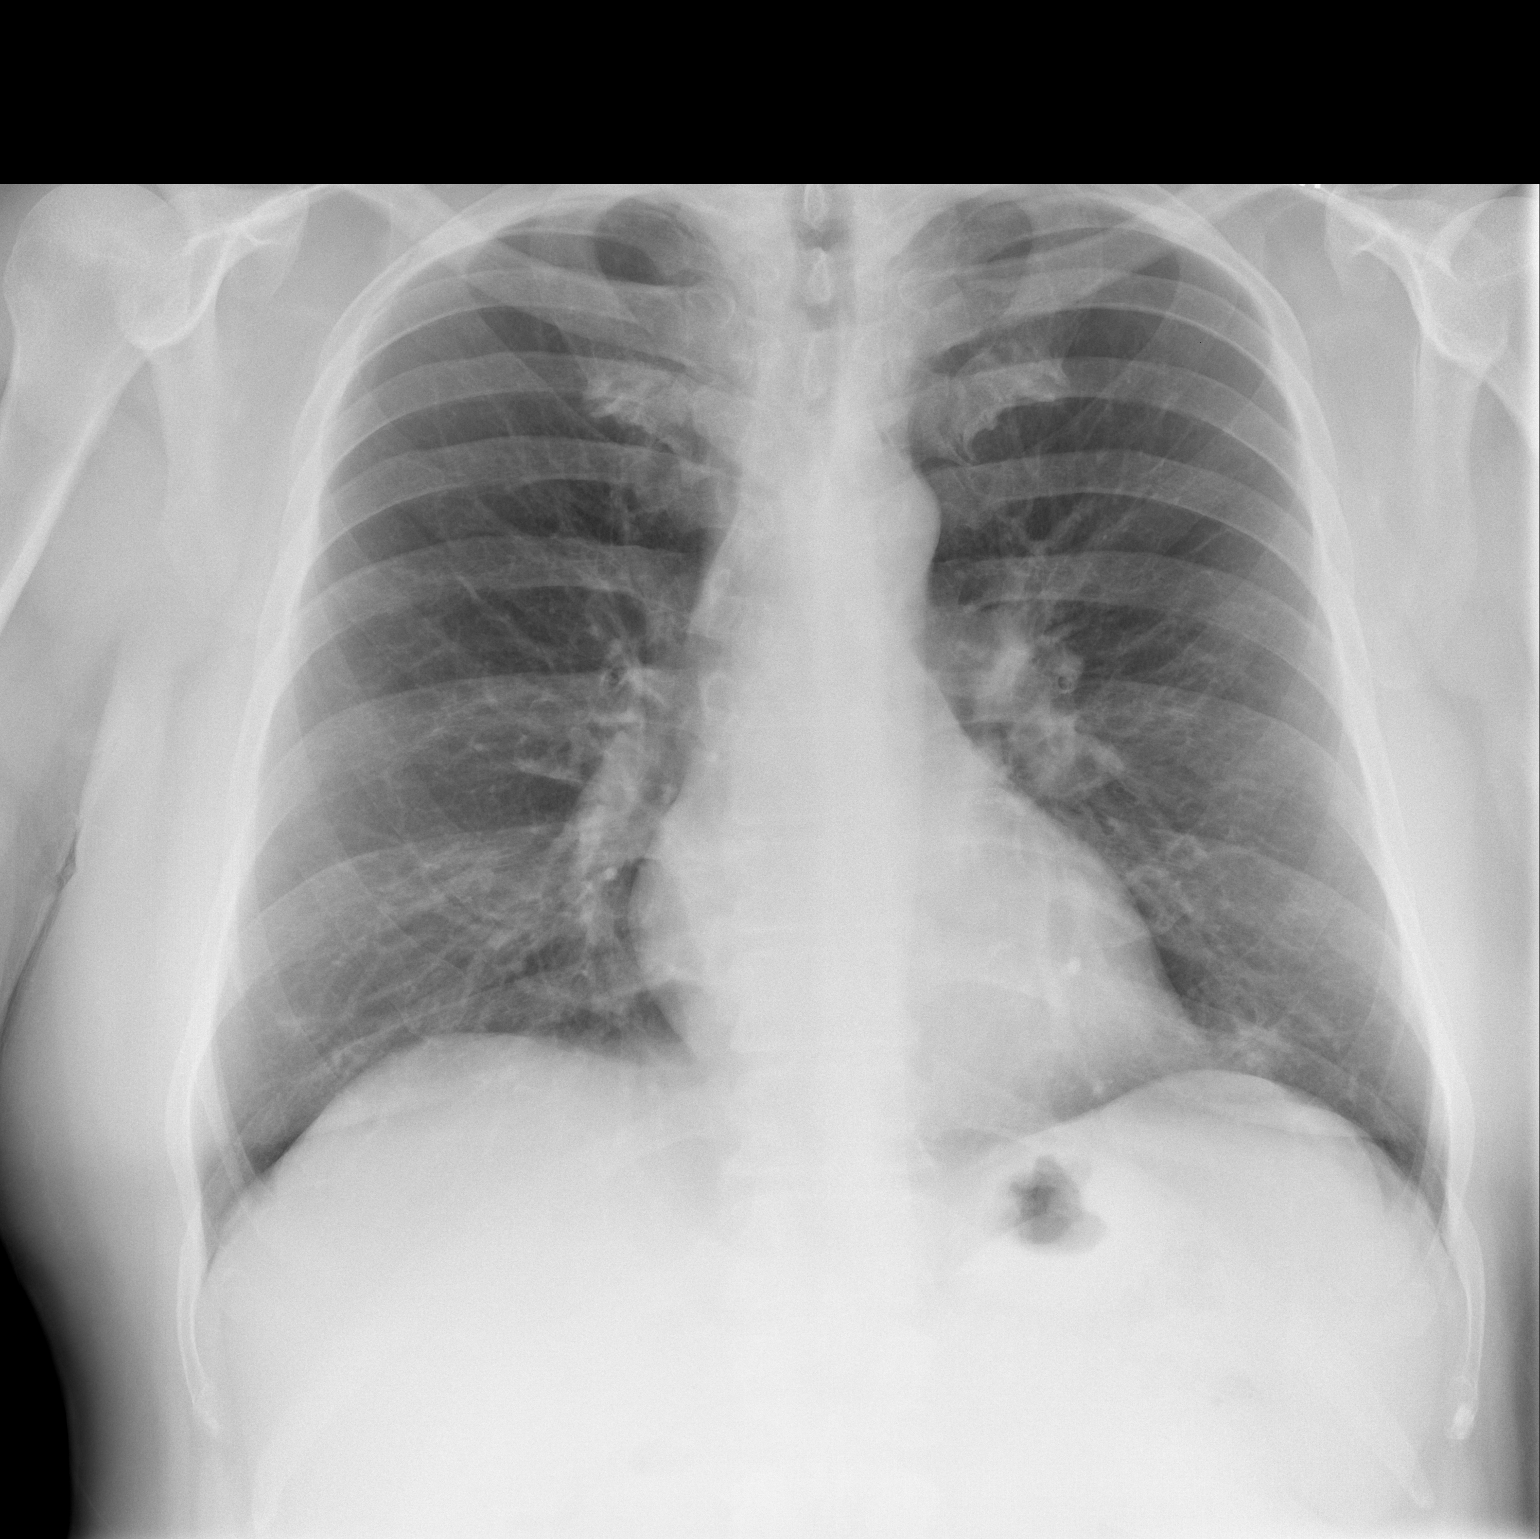

[w chest lat]
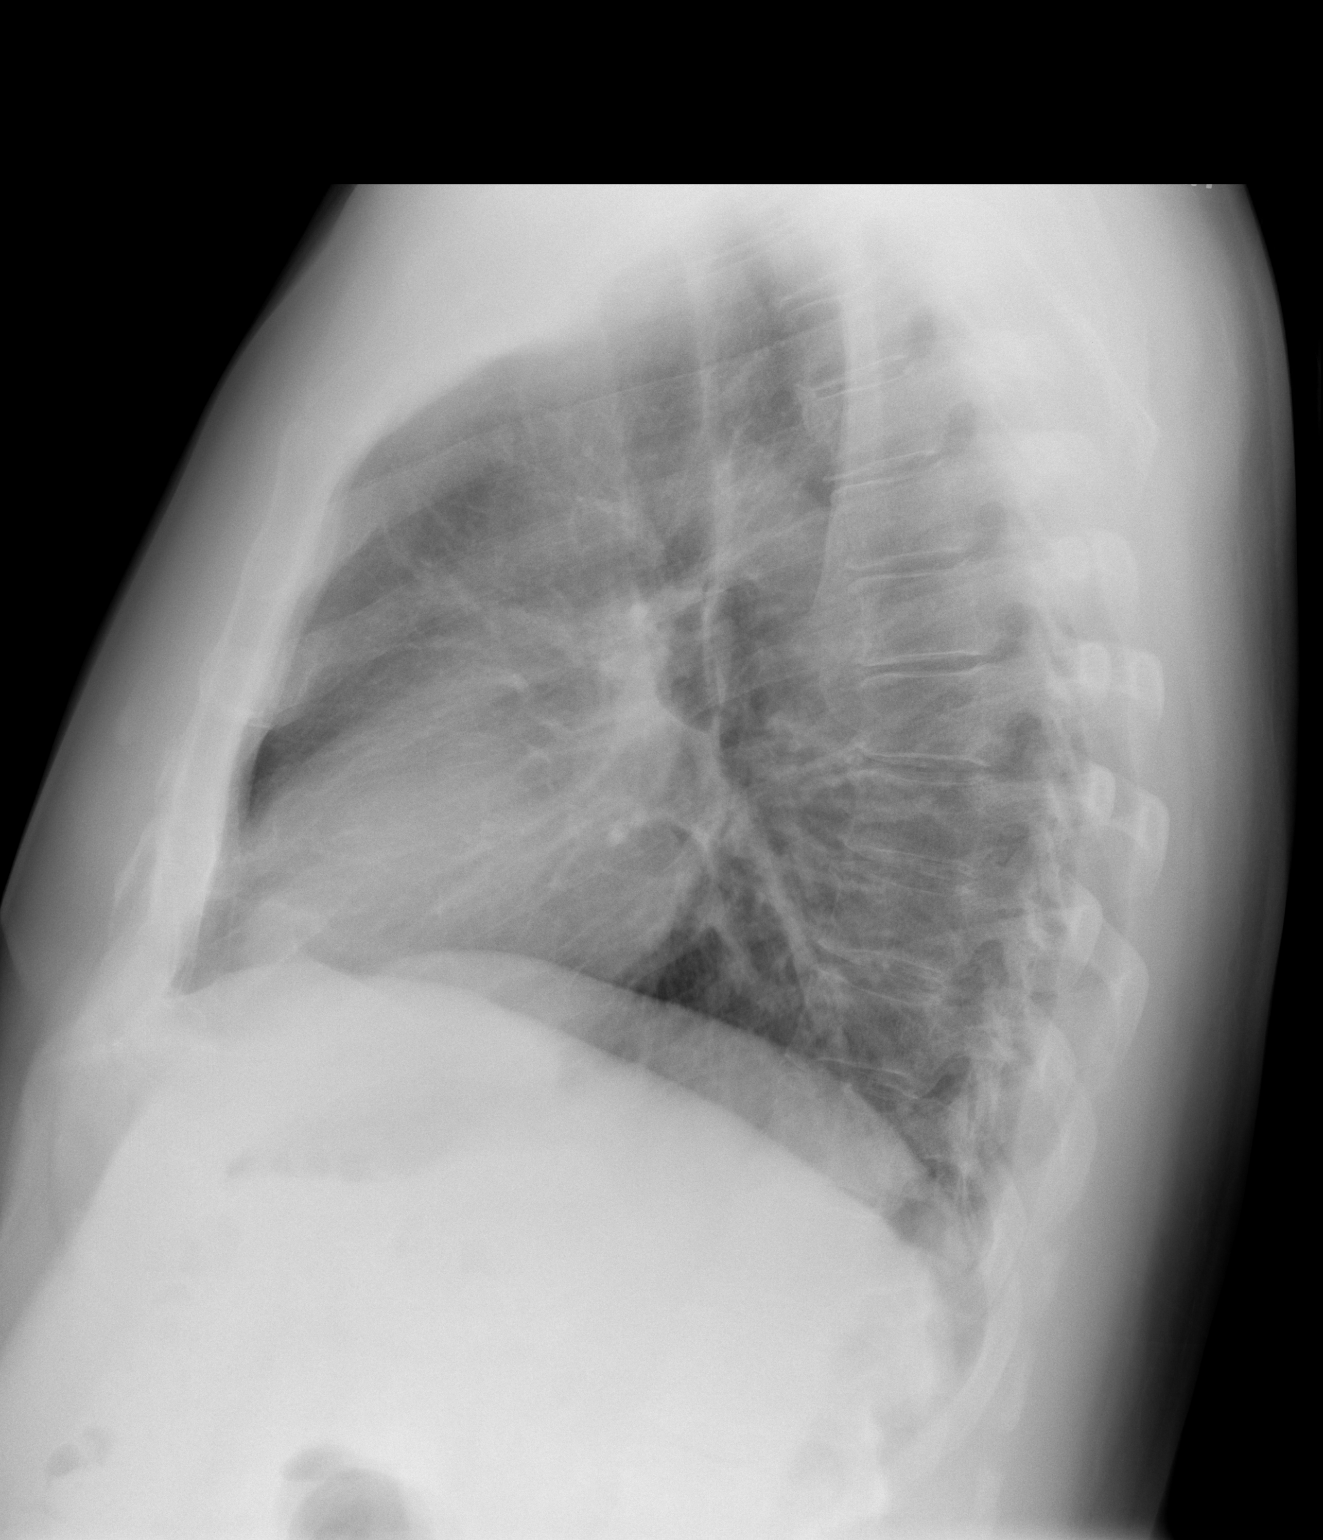

[2 of 2 positions shown; findings below may reference images not displayed]

FINDINGS: Normal heart size and vascularity.  Negative for
pneumonia, edema, collapse, consolidation, effusion or
pneumothorax.  Right lower chest probable nipple shadow noted.  In
the left lower lobe, a small irregular nodular density roughly
measures 16 mm and could represent confluence of shadows versus
underlying developing nodule when compared to the prior studies.

Recommend further evaluation with non emergent chest CT.
IMPRESSION: 16 mm irregular nodular opacity versus nodule in the left lower
lobe.  Recommend follow-up non emergent chest CT.

Negative for acute pneumonia.

## 2011-10-16 ENCOUNTER — Ambulatory Visit (INDEPENDENT_AMBULATORY_CARE_PROVIDER_SITE_OTHER): Payer: BC Managed Care – PPO | Admitting: Family Medicine

## 2011-10-16 ENCOUNTER — Encounter: Payer: Self-pay | Admitting: Family Medicine

## 2011-10-16 VITALS — BP 118/68 | HR 72 | Temp 97.7°F | Resp 12 | Ht 69.75 in | Wt 259.0 lb

## 2011-10-16 DIAGNOSIS — R5381 Other malaise: Secondary | ICD-10-CM

## 2011-10-16 DIAGNOSIS — Z0000 Encounter for general adult medical examination without abnormal findings: Secondary | ICD-10-CM

## 2011-10-16 DIAGNOSIS — R5383 Other fatigue: Secondary | ICD-10-CM

## 2011-10-16 LAB — TESTOSTERONE: Testosterone: 262.93 ng/dL — ABNORMAL LOW (ref 350.00–890.00)

## 2011-10-16 MED ORDER — SIMVASTATIN 20 MG PO TABS: 20.0000 mg | ORAL_TABLET | Freq: Every evening | ORAL | Status: DC

## 2011-10-16 MED ORDER — TADALAFIL 20 MG PO TABS: 20.0000 mg | ORAL_TABLET | Freq: Every day | ORAL | Status: DC | PRN

## 2011-10-16 NOTE — Patient Instructions (Addendum)
-  Schedule repeat colonoscopy

## 2011-10-16 NOTE — Progress Notes (Signed)
Subjective:    Patient ID: Joel Williams, male    DOB: [birth year], 61 y.o.   MRN: [id number]  HPI  Patient for complete physical. He has history of obesity, type 2 diabetes, CAD, hypertension, dyslipidemia. Repeat catheterization last spring which was unremarkable. Diabetes has been marginally controlled. Uses insulin pump. Followed by endocrinologist. No regular exercise. He has excessive fatigue issues. He requested testosterone testing. No observed apnea episodes. Had colonoscopy 3 years ago with adenomatous polyps and due for repeat this year. Patient quit smoking several years ago  Past Medical History  Diagnosis Date  . DIABETES MELLITUS, TYPE II 06/08/2008  . HYPERLIPIDEMIA 06/08/2008  . HYPERTENSION 06/08/2008  . ASTHMA 06/08/2008  . Impotence of organic origin 08/24/2008  . Chronic kidney disease   . Hemorrhoids   . Colonic polyp   . CAD (coronary artery disease)     a. s/p Promus DES (3.0 x 16 mm) to mid LAD 05/16/10;  b. Myoview 05/15/10: anteroseptal ischemia with EF 52%;   c. Cath 05/16/10: single vessel CAD with mLAD 90% tx with PCI and EF 50%;  d. 07/2011 Cath:  Patent stent, nonobs dzs, NL EF.  Marland Kitchen Myocardial infarction 04/2010  . Pneumonia     hx of  . GERD (gastroesophageal reflux disease)    Past Surgical History  Procedure Date  . Coronary angioplasty with stent placement     reports that he quit smoking about 9 years ago. His smoking use included Cigarettes. He has a 35 pack-year smoking history. He has never used smokeless tobacco. He reports that he drinks alcohol. He reports that he does not use illicit drugs. family history includes Diabetes in his father and Heart disease in his father. No Known Allergies   Review of Systems  Constitutional: Positive for fatigue. Negative for fever, activity change, appetite change and unexpected weight change.  HENT: Negative for ear pain, congestion and trouble swallowing.   Eyes: Negative for pain and visual disturbance.    Respiratory: Negative for cough, shortness of breath and wheezing.   Cardiovascular: Negative for chest pain and palpitations.  Gastrointestinal: Negative for nausea, vomiting, abdominal pain, diarrhea, constipation, blood in stool, abdominal distention and rectal pain.  Genitourinary: Negative for dysuria, hematuria and testicular pain.  Musculoskeletal: Negative for joint swelling and arthralgias.  Skin: Negative for rash.  Neurological: Negative for dizziness, syncope and headaches.  Hematological: Negative for adenopathy.  Psychiatric/Behavioral: Negative for confusion and dysphoric mood.       Objective:   Physical Exam  Constitutional: He is oriented to person, place, and time. He appears well-developed and well-nourished. No distress.  HENT:  Head: Normocephalic and atraumatic.  Right Ear: External ear normal.  Left Ear: External ear normal.  Mouth/Throat: Oropharynx is clear and moist.  Eyes: Conjunctivae and EOM are normal. Pupils are equal, round, and reactive to light.  Neck: Normal range of motion. Neck supple. No thyromegaly present.  Cardiovascular: Normal rate, regular rhythm and normal heart sounds.   No murmur heard. Pulmonary/Chest: No respiratory distress. He has no wheezes. He has no rales.  Abdominal: Soft. Bowel sounds are normal. He exhibits no distension and no mass. There is no tenderness. There is no rebound and no guarding.  Musculoskeletal: He exhibits no edema.  Lymphadenopathy:    He has no cervical adenopathy.  Neurological: He is alert and oriented to person, place, and time. He displays normal reflexes. No cranial nerve deficit.  Skin: No rash noted.  Psychiatric: He has a normal mood and  affect.          Assessment & Plan:  Complete physical. Labs reviewed with patient. A1c 7.9%. Continue close followup with endocrinology. Discussed importance of exercise and weight control. Check testosterone level and consider replacement if low. Hopefully  this can assist with visceral body fat loss and exercise efforts. Refill simvastatin and Cialis for one year.   immunizations up to date.  patient will schedule repeat colonoscopy

## 2011-10-19 ENCOUNTER — Other Ambulatory Visit: Payer: Self-pay | Admitting: *Deleted

## 2011-10-19 DIAGNOSIS — Z8639 Personal history of other endocrine, nutritional and metabolic disease: Secondary | ICD-10-CM

## 2011-10-19 MED ORDER — TESTOSTERONE 20.25 MG/ACT (1.62%) TD GEL: 2.0000 | Freq: Every morning | TRANSDERMAL | Status: DC

## 2011-10-19 NOTE — Progress Notes (Signed)
Quick Note:  Pt informed, Rx called, future lab ordered ______

## 2011-11-03 ENCOUNTER — Telehealth: Payer: Self-pay | Admitting: *Deleted

## 2011-11-03 NOTE — Telephone Encounter (Signed)
Open in error

## 2011-11-20 ENCOUNTER — Other Ambulatory Visit (INDEPENDENT_AMBULATORY_CARE_PROVIDER_SITE_OTHER): Payer: BC Managed Care – PPO

## 2011-11-20 DIAGNOSIS — Z8639 Personal history of other endocrine, nutritional and metabolic disease: Secondary | ICD-10-CM

## 2011-11-23 ENCOUNTER — Other Ambulatory Visit: Payer: Self-pay | Admitting: *Deleted

## 2011-11-23 DIAGNOSIS — Z8639 Personal history of other endocrine, nutritional and metabolic disease: Secondary | ICD-10-CM

## 2011-11-23 MED ORDER — TESTOSTERONE 20.25 MG/ACT (1.62%) TD GEL: 4.0000 | Freq: Every morning | TRANSDERMAL | Status: DC

## 2011-11-23 NOTE — Progress Notes (Signed)
Quick Note:  LMTCB on pt cell phone ______

## 2011-11-23 NOTE — Progress Notes (Signed)
Quick Note:  Pt informed, sill re-send Rx testosterone to [facility] and do future order for lab ______

## 2012-01-06 ENCOUNTER — Other Ambulatory Visit: Payer: Self-pay | Admitting: Cardiovascular Disease

## 2012-02-18 ENCOUNTER — Encounter: Payer: Self-pay | Admitting: Cardiovascular Disease

## 2012-02-18 ENCOUNTER — Ambulatory Visit (INDEPENDENT_AMBULATORY_CARE_PROVIDER_SITE_OTHER): Payer: BC Managed Care – PPO | Admitting: Cardiovascular Disease

## 2012-02-18 VITALS — BP 136/64 | HR 75 | Resp 18 | Ht 70.0 in | Wt 262.0 lb

## 2012-02-18 DIAGNOSIS — I2510 Atherosclerotic heart disease of native coronary artery without angina pectoris: Secondary | ICD-10-CM

## 2012-02-18 MED ORDER — LISINOPRIL-HYDROCHLOROTHIAZIDE 20-12.5 MG PO TABS: 1.0000 | ORAL_TABLET | Freq: Every day | ORAL | Status: DC

## 2012-02-18 MED ORDER — METOPROLOL SUCCINATE ER 25 MG PO TB24: 25.0000 mg | ORAL_TABLET | Freq: Every day | ORAL | Status: DC

## 2012-02-18 NOTE — Patient Instructions (Addendum)
Your physician wants you to follow-up in:  6 months. You will receive a reminder letter in the mail two months in advance. If you don't receive a letter, please call our office to schedule the follow-up appointment.   

## 2012-02-18 NOTE — Progress Notes (Signed)
History of Present Illness: 61 y.o. male with a history of CAD, DM, HTN, hyperlipidemia who presented to the hospital on 05/14/10 with chest pain. MI was ruled out. Myoview study was positive for anteroseptal ischemia and EF of 52%. Cardiac catheterization demonstrated single vessel CAD with a mid LAD stenosis of 90%. Dr. Kirke Corin performed his cath. This was treated with a 3.0 x 16 mm Promus Element DES. He was placed on aspirin and Effient. I saw him for the first time in the office in June 2012. He described awareness of skipped beats at the initial visit. I had him wear a 48 hour Holter monitor. This showed rare PVCs and PACs.   He is here today for follow up. He has had no chest pain. He does describe dyspnea with exertion. This is minimal. No palpitations, near syncope or syncope.   His primary care is Dr. Caryl Never.   Last Lipid Profile:Lipid Panel     Component Value Date/Time   CHOL 113 10/02/2011 0900   TRIG 110.0 10/02/2011 0900   HDL 30.20* 10/02/2011 0900   CHOLHDL 4 10/02/2011 0900   VLDL 22.0 10/02/2011 0900   LDLCALC 61 10/02/2011 0900     Past Medical History  Diagnosis Date  . DIABETES MELLITUS, TYPE II 06/08/2008  . HYPERLIPIDEMIA 06/08/2008  . HYPERTENSION 06/08/2008  . ASTHMA 06/08/2008  . Impotence of organic origin 08/24/2008  . Chronic kidney disease   . Hemorrhoids   . Colonic polyp   . CAD (coronary artery disease)     a. s/p Promus DES (3.0 x 16 mm) to mid LAD 05/16/10;  b. Myoview 05/15/10: anteroseptal ischemia with EF 52%;   c. Cath 05/16/10: single vessel CAD with mLAD 90% tx with PCI and EF 50%;  d. 07/2011 Cath:  Patent stent, nonobs dzs, NL EF.  Marland Kitchen Myocardial infarction 04/2010  . Pneumonia     hx of  . GERD (gastroesophageal reflux disease)     Past Surgical History  Procedure Date  . Coronary angioplasty with stent placement     Current Outpatient Prescriptions  Medication Sig Dispense Refill  . Ascorbic Acid (VITAMIN C) 1000 MG tablet Take 1,000 mg by  mouth 2 (two) times daily.       Marland Kitchen aspirin 81 MG tablet Take 1 tablet (81 mg total) by mouth daily.  30 tablet  0  . Biotin 1000 MCG tablet Take 1,000 mcg by mouth 3 (three) times daily.      . Cholecalciferol (VITAMIN D) 2000 UNITS CAPS Take 1 capsule by mouth daily.      . fish oil-omega-3 fatty acids 1000 MG capsule Take 1 g by mouth daily.       . fluticasone (FLONASE) 50 MCG/ACT nasal spray Place 2 sprays into the nose daily as needed. For pollen allergies      . glucagon (GLUCAGON EMERGENCY) 1 MG injection Inject 1 mg into the vein once as needed. For low blood sugar      . Insulin Human (INSULIN PUMP) 100 unit/ml SOLN Inject into the skin continuous. novolog: 75 - 80 units per day.      . lisinopril-hydrochlorothiazide (PRINZIDE,ZESTORETIC) 20-12.5 MG per tablet Take 1 tablet by mouth daily.      . metFORMIN (GLUCOPHAGE) 500 MG tablet Take 2 tablets (1,000 mg total) by mouth 2 (two) times daily with a meal.      . metoprolol succinate (TOPROL-XL) 25 MG 24 hr tablet TAKE 1 TABLET DAILY  90 tablet  1  .  Multiple Vitamins-Minerals (ICAPS PO) Take 1 tablet by mouth every evening.      . nitroGLYCERIN (NITROSTAT) 0.4 MG SL tablet Place 0.4 mg under the tongue every 5 (five) minutes as needed. For chest pain      . NOVOLOG 100 UNIT/ML injection       . simvastatin (ZOCOR) 20 MG tablet Take 1 tablet (20 mg total) by mouth every evening.  90 tablet  3  . tadalafil (CIALIS) 20 MG tablet Take 1 tablet (20 mg total) by mouth daily as needed. For erectile dysfunction  18 tablet  3  . Testosterone 20.25 MG/ACT (1.62%) GEL Place 4 Act onto the skin every morning. Please send pt a 3 month supply, thank you  75 g  3  . vitamin E 400 UNIT capsule Take 400 Units by mouth every evening.        No Known Allergies  History   Social History  . Marital Status: Married    Spouse Name: N/A    Number of Children: N/A  . Years of Education: N/A   Occupational History  . Not on file.   Social History  Main Topics  . Smoking status: Former Smoker -- 1.0 packs/day for 35 years    Types: Cigarettes    Quit date: 05/23/2002  . Smokeless tobacco: Never Used  . Alcohol Use: Yes     Comment: daily  . Drug Use: No  . Sexually Active: Not Currently   Other Topics Concern  . Not on file   Social History Narrative  . No narrative on file    Family History  Problem Relation Age of Onset  . Diabetes Father   . Heart disease Father     Review of Systems:  As stated in the HPI and otherwise negative.   BP 136/64  Pulse 75  Resp 18  Ht 5\' 10"  (1.778 m)  Wt 262 lb (118.842 kg)  BMI 37.59 kg/m2  SpO2 97%  Physical Examination: General: Well developed, well nourished, NAD HEENT: OP clear, mucus membranes moist SKIN: warm, dry. No rashes. Neuro: No focal deficits Musculoskeletal: Muscle strength 5/5 all ext Psychiatric: Mood and affect normal Neck: No JVD, no carotid bruits, no thyromegaly, no lymphadenopathy. Lungs:Clear bilaterally, no wheezes, rhonci, crackles Cardiovascular: Regular rate and rhythm. No murmurs, gallops or rubs. Abdomen:Soft. Bowel sounds present. Non-tender.  Extremities: No lower extremity edema. Pulses are 2 + in the bilateral DP/PT.  Cardiac cath May 2013: Left main: No obstructive disease.  Left Anterior Descending Artery: Large vessel that courses to the apex. Patent stent mid vessel with no restenosis. Both diagonal branches are patent.  Circumflex Artery: Large caliber vessel with early intermediate branch followed by two moderate sized OM branches. No obstructive disease noted.  Right Coronary Artery: Small non-dominant vessel with no obstructive disease noted.  Left Ventricular Angiogram: LVEF 60%.    Assessment and Plan:   1. CAD: Stable.Cath May 2013 with stable disease. No chest pain. No changes today. He is doing well. BP and lipids well controlled.

## 2012-02-21 ENCOUNTER — Encounter: Payer: Self-pay | Admitting: Family Medicine

## 2012-05-16 ENCOUNTER — Ambulatory Visit (INDEPENDENT_AMBULATORY_CARE_PROVIDER_SITE_OTHER): Payer: BC Managed Care – PPO | Admitting: Family Medicine

## 2012-05-16 ENCOUNTER — Encounter: Payer: Self-pay | Admitting: Family Medicine

## 2012-05-16 VITALS — BP 130/70 | Temp 98.0°F

## 2012-05-16 DIAGNOSIS — J069 Acute upper respiratory infection, unspecified: Secondary | ICD-10-CM

## 2012-05-16 MED ORDER — HYDROCODONE-HOMATROPINE 5-1.5 MG/5ML PO SYRP: 5.0000 mL | ORAL_SOLUTION | Freq: Four times a day (QID) | ORAL | Status: AC | PRN

## 2012-05-16 NOTE — Patient Instructions (Addendum)

## 2012-05-16 NOTE — Progress Notes (Signed)
  Subjective:    Patient ID: Joel Williams, male    DOB: Jan 11, 1951, 62 y.o.   MRN: [id number]  HPI Patient seen with about 3 weeks history of cough. Started with typical viral URI-type symptoms about 3 weeks ago. He developed left earache he went to company nurse. He was given Zithromax for presumed infectious process. He developed tinnitus and some left-sided hearing loss and went to ENT. Never had vertigo. Placed by ENT on prednisone.  Maj. issue now is cough, especially at night. Occasionally productive. No fever. No pleuritic pain. No dyspnea  Past Medical History  Diagnosis Date  . DIABETES MELLITUS, TYPE II 06/08/2008  . HYPERLIPIDEMIA 06/08/2008  . HYPERTENSION 06/08/2008  . ASTHMA 06/08/2008  . Impotence of organic origin 08/24/2008  . Chronic kidney disease   . Hemorrhoids   . Colonic polyp   . CAD (coronary artery disease)     a. s/p Promus DES (3.0 x 16 mm) to mid LAD 05/16/10;  b. Myoview 05/15/10: anteroseptal ischemia with EF 52%;   c. Cath 05/16/10: single vessel CAD with mLAD 90% tx with PCI and EF 50%;  d. 07/2011 Cath:  Patent stent, nonobs dzs, NL EF.  Marland Kitchen Myocardial infarction 04/2010  . Pneumonia     hx of  . GERD (gastroesophageal reflux disease)    Past Surgical History  Procedure Laterality Date  . Coronary angioplasty with stent placement      reports that he quit smoking about 9 years ago. His smoking use included Cigarettes. He has a 35 pack-year smoking history. He has never used smokeless tobacco. He reports that  drinks alcohol. He reports that he does not use illicit drugs. family history includes Diabetes in his father and Heart disease in his father. No Known Allergies    Review of Systems  Constitutional: Positive for fatigue. Negative for fever and chills.  HENT: Positive for hearing loss (As above) and tinnitus. Negative for congestion.   Respiratory: Positive for cough. Negative for shortness of breath and wheezing.        Objective:   Physical Exam  Constitutional: He appears well-developed and well-nourished.  HENT:  Right Ear: External ear normal.  Left Ear: External ear normal.  Mouth/Throat: Oropharynx is clear and moist.  Neck: Neck supple.  Cardiovascular: Normal rate and regular rhythm.   Pulmonary/Chest: Effort normal and breath sounds normal. No respiratory distress. He has no wheezes. He has no rales.  Lymphadenopathy:    He has no cervical adenopathy.          Assessment & Plan:  Recent viral URI with cough. He's had some acute left-sided hearing loss followed by ENT. Hycodan cough syrup for nighttime use as needed. Follow up immediately for fever or worsening symptoms

## 2012-08-10 ENCOUNTER — Other Ambulatory Visit: Payer: Self-pay | Admitting: Cardiovascular Disease

## 2012-10-21 ENCOUNTER — Ambulatory Visit (INDEPENDENT_AMBULATORY_CARE_PROVIDER_SITE_OTHER): Payer: BC Managed Care – PPO | Admitting: Family Medicine

## 2012-10-21 ENCOUNTER — Encounter: Payer: Self-pay | Admitting: Family Medicine

## 2012-10-21 VITALS — BP 118/62 | HR 81 | Temp 97.7°F | Ht 70.0 in | Wt 261.0 lb

## 2012-10-21 DIAGNOSIS — Z0000 Encounter for general adult medical examination without abnormal findings: Secondary | ICD-10-CM

## 2012-10-21 MED ORDER — TESTOSTERONE 20.25 MG/ACT (1.62%) TD GEL: 4.0000 | Freq: Every morning | TRANSDERMAL | Status: DC

## 2012-10-21 MED ORDER — TADALAFIL 20 MG PO TABS: ORAL_TABLET | ORAL | Status: DC

## 2012-10-21 NOTE — Progress Notes (Signed)
Subjective:    Patient ID: Joel Williams, male    DOB: 1950-07-31, 62 y.o.   MRN: [id number]  HPI Patient for complete physical. Has history of obesity, type 2 diabetes, CAD, hyperlipidemia, hypertension. He is followed by endocrinology. Recent A1c 6.5%. Labs reviewed. Lipids were stable at goal with exception of low HDL. He has low testosterone and remains on replacement. No recent chest pains. Overall, his medical problems are well controlled  Pneumovax 2007. Tetanus up-to-date. Colonoscopy up to date. No consistent exercise.  He has had occasional episodes at night where he wakes up gasping for air. No chest pain.  No observed apnea. Occasional daytime somnolence. He is not ready to pursue workup at this time.  Past Medical History  Diagnosis Date  . DIABETES MELLITUS, TYPE II 06/08/2008  . HYPERLIPIDEMIA 06/08/2008  . HYPERTENSION 06/08/2008  . ASTHMA 06/08/2008  . Impotence of organic origin 08/24/2008  . Chronic kidney disease   . Hemorrhoids   . Colonic polyp   . CAD (coronary artery disease)     a. s/p Promus DES (3.0 x 16 mm) to mid LAD 05/16/10;  b. Myoview 05/15/10: anteroseptal ischemia with EF 52%;   c. Cath 05/16/10: single vessel CAD with mLAD 90% tx with PCI and EF 50%;  d. 07/2011 Cath:  Patent stent, nonobs dzs, NL EF.  Marland Kitchen Myocardial infarction 04/2010  . Pneumonia     hx of  . GERD (gastroesophageal reflux disease)    Past Surgical History  Procedure Laterality Date  . Coronary angioplasty with stent placement      reports that he quit smoking about 10 years ago. His smoking use included Cigarettes. He has a 35 pack-year smoking history. He has never used smokeless tobacco. He reports that  drinks alcohol. He reports that he does not use illicit drugs. family history includes Diabetes in his father and Heart disease in his father. No Known Allergies    Review of Systems  Constitutional: Positive for fatigue. Negative for fever, activity change and appetite change.   HENT: Negative for ear pain, congestion and trouble swallowing.   Eyes: Negative for pain and visual disturbance.  Respiratory: Negative for cough, shortness of breath and wheezing.   Cardiovascular: Negative for chest pain and palpitations.  Gastrointestinal: Negative for nausea, vomiting, abdominal pain, diarrhea, constipation, blood in stool, abdominal distention and rectal pain.  Genitourinary: Negative for dysuria, hematuria and testicular pain.  Musculoskeletal: Negative for joint swelling and arthralgias.  Skin: Negative for rash.  Neurological: Negative for dizziness, syncope and headaches.  Hematological: Negative for adenopathy.  Psychiatric/Behavioral: Negative for confusion and dysphoric mood.       Objective:   Physical Exam  Constitutional: He is oriented to person, place, and time. He appears well-developed and well-nourished. No distress.  HENT:  Head: Normocephalic and atraumatic.  Right Ear: External ear normal.  Left Ear: External ear normal.  Mouth/Throat: Oropharynx is clear and moist.  Eyes: Conjunctivae and EOM are normal. Pupils are equal, round, and reactive to light.  Neck: Normal range of motion. Neck supple. No thyromegaly present.  Cardiovascular: Normal rate, regular rhythm and normal heart sounds.   No murmur heard. Pulmonary/Chest: No respiratory distress. He has no wheezes. He has no rales.  Abdominal: Soft. Bowel sounds are normal. He exhibits no distension and no mass. There is no tenderness. There is no rebound and no guarding.  Musculoskeletal: He exhibits no edema.  Lymphadenopathy:    He has no cervical adenopathy.  Neurological: He is  alert and oriented to person, place, and time. He displays normal reflexes. No cranial nerve deficit.  Skin: No rash noted.  Psychiatric: He has a normal mood and affect.          Assessment & Plan:  Complete physical. Pneumovax booster in 2 years. Continue yearly flu vaccine. Attempt to lose some  weight. Consider sleep study to rule out obstructive sleep apnea but at this point he wishes to wait.

## 2012-10-21 NOTE — Patient Instructions (Addendum)
Continue with yearly flu vaccine Continue weight loss efforts.  Consider sleep study if night time shortness of breath persists.

## 2012-10-22 ENCOUNTER — Other Ambulatory Visit: Payer: Self-pay | Admitting: Family Medicine

## 2012-11-08 ENCOUNTER — Other Ambulatory Visit: Payer: Self-pay | Admitting: Cardiovascular Disease

## 2012-11-21 ENCOUNTER — Ambulatory Visit (INDEPENDENT_AMBULATORY_CARE_PROVIDER_SITE_OTHER)
Admission: RE | Admit: 2012-11-21 | Discharge: 2012-11-21 | Disposition: A | Discharge status: Home / Self Care | Payer: BC Managed Care – PPO | Source: Ambulatory Visit | Attending: Family Medicine | Admitting: Family Medicine

## 2012-11-21 ENCOUNTER — Telehealth: Payer: Self-pay | Admitting: *Deleted

## 2012-11-21 DIAGNOSIS — R05 Cough: Secondary | ICD-10-CM

## 2012-11-21 NOTE — Telephone Encounter (Signed)
Brooks Sailors, a PA at the patient's office - [facility], is calling because she would like to know if the patient can have a chest x-ray.  The patient was treated with a z-pak last week for a cough, but there has not been much improvement.  He is still coughing with mucous.  She only work limited hours but her number is [phone] or call the patient at [phone]. Please advise as to an order for a chest x-ray or schedule an office visit for the patient?

## 2012-11-21 NOTE — Telephone Encounter (Signed)
OK to order CXR.  If he has any dyspnea or persistent fever, I would recommend he been seen here as well.

## 2012-11-21 NOTE — Telephone Encounter (Signed)
Left detailed message on machine for patient okay for xray and to call for appointment for any dyspnea or fever.  Order placed for xray.

## 2013-01-24 ENCOUNTER — Encounter: Payer: Self-pay | Admitting: Family Medicine

## 2013-02-13 ENCOUNTER — Other Ambulatory Visit: Payer: Self-pay | Admitting: *Deleted

## 2013-02-13 MED ORDER — METOPROLOL SUCCINATE ER 25 MG PO TB24: 25.0000 mg | ORAL_TABLET | Freq: Every day | ORAL | Status: DC

## 2013-04-21 ENCOUNTER — Other Ambulatory Visit: Payer: Self-pay | Admitting: Cardiovascular Disease

## 2013-05-18 ENCOUNTER — Encounter: Payer: Self-pay | Admitting: Family Medicine

## 2013-05-18 ENCOUNTER — Ambulatory Visit (INDEPENDENT_AMBULATORY_CARE_PROVIDER_SITE_OTHER): Payer: BC Managed Care – PPO | Admitting: Family Medicine

## 2013-05-18 VITALS — BP 132/70 | HR 93 | Temp 98.4°F | Wt 263.0 lb

## 2013-05-18 DIAGNOSIS — R6889 Other general symptoms and signs: Secondary | ICD-10-CM

## 2013-05-18 DIAGNOSIS — R0609 Other forms of dyspnea: Secondary | ICD-10-CM

## 2013-05-18 DIAGNOSIS — R0600 Dyspnea, unspecified: Secondary | ICD-10-CM

## 2013-05-18 DIAGNOSIS — R0989 Other specified symptoms and signs involving the circulatory and respiratory systems: Secondary | ICD-10-CM

## 2013-05-18 LAB — POCT INFLUENZA A/B
Influenza A, POC: NEGATIVE
Influenza B, POC: NEGATIVE

## 2013-05-18 MED ORDER — AZITHROMYCIN 250 MG PO TABS: ORAL_TABLET | ORAL | Status: DC

## 2013-05-18 NOTE — Progress Notes (Signed)
Pre visit review using our clinic review tool, if applicable. No additional management support is needed unless otherwise documented below in the visit note. 

## 2013-05-18 NOTE — Progress Notes (Signed)
   Subjective:    Patient ID: Joel Williams, male    DOB: [birth year], 63 y.o.   MRN: [id number]  Sinusitis Associated symptoms include chills, congestion, headaches, shortness of breath and sinus pressure. Pertinent negatives include no coughing.   Patient seen with acute upper respiratory infection. Onset this past week. Had sinus congestion headaches. Generalized fatigue. Body aches. No sore throat. Low-grade fever off and on. He's had some thick yellow nasal discharge and facial pain maxillary and mostly frontal region bilaterally. Denies any cough.  He has type 2 diabetes and multiple other medical problems as below. He has history of CAD with catheterization May 2013 revealing patent stent left anterior descending vessel. He's not any recent chest pain does have some dyspnea with minimal exertion. No cough or wheezing. No orthopnea. No increased peripheral edema  Past Medical History  Diagnosis Date  . DIABETES MELLITUS, TYPE II 06/08/2008  . HYPERLIPIDEMIA 06/08/2008  . HYPERTENSION 06/08/2008  . ASTHMA 06/08/2008  . Impotence of organic origin 08/24/2008  . Chronic kidney disease   . Hemorrhoids   . Colonic polyp   . CAD (coronary artery disease)     a. s/p Promus DES (3.0 x 16 mm) to mid LAD 05/16/10;  b. Myoview 05/15/10: anteroseptal ischemia with EF 52%;   c. Cath 05/16/10: single vessel CAD with mLAD 90% tx with PCI and EF 50%;  d. 07/2011 Cath:  Patent stent, nonobs dzs, NL EF.  Marland Kitchen Myocardial infarction 04/2010  . Pneumonia     hx of  . GERD (gastroesophageal reflux disease)    Past Surgical History  Procedure Laterality Date  . Coronary angioplasty with stent placement      reports that he quit smoking about 10 years ago. His smoking use included Cigarettes. He has a 35 pack-year smoking history. He has never used smokeless tobacco. He reports that he drinks alcohol. He reports that he does not use illicit drugs. family history includes Diabetes in his father; Heart disease in his  father. No Known Allergies    Review of Systems  Constitutional: Positive for fever, chills and fatigue.  HENT: Positive for congestion and sinus pressure.   Respiratory: Positive for shortness of breath. Negative for cough and wheezing.   Cardiovascular: Negative for chest pain, palpitations and leg swelling.  Neurological: Positive for headaches.       Objective:   Physical Exam  Constitutional: He appears well-developed and well-nourished.  HENT:  Mouth/Throat: Oropharynx is clear and moist.  Neck: Neck supple.  Cardiovascular: Normal rate.   Pulmonary/Chest: Effort normal and breath sounds normal. No respiratory distress. He has no wheezes. He has no rales.  Musculoskeletal: He exhibits no edema.  Lymphadenopathy:    He has no cervical adenopathy.          Assessment & Plan:  #1 acute sinusitis. We explained these are usually viral but has had very difficult time shaking these in the past. He had some progressive symptoms of the past week. Start Zithromax. Influenza screen is negative #2 dyspnea in the absence of chest pain. He does not have any respiratory symptoms such as cough or wheezing to suggest respiratory component. Check EKG. Consider close followup with cardiology  EKG is is rhythm with incomplete right bundle branch block which is not new. He is nonfocal exam. We'll set up to see cardiology

## 2013-05-18 NOTE — Patient Instructions (Signed)
We will call you regarding follow up with cardiology. Follow up promptly for any chest pain or increasing shortness of breath.

## 2013-05-19 ENCOUNTER — Ambulatory Visit (INDEPENDENT_AMBULATORY_CARE_PROVIDER_SITE_OTHER): Payer: BC Managed Care – PPO | Admitting: Cardiovascular Disease

## 2013-05-19 ENCOUNTER — Encounter: Payer: Self-pay | Admitting: Cardiovascular Disease

## 2013-05-19 VITALS — BP 148/70 | HR 95 | Ht 70.0 in | Wt 255.8 lb

## 2013-05-19 DIAGNOSIS — I2510 Atherosclerotic heart disease of native coronary artery without angina pectoris: Secondary | ICD-10-CM

## 2013-05-19 DIAGNOSIS — R0609 Other forms of dyspnea: Secondary | ICD-10-CM

## 2013-05-19 DIAGNOSIS — E785 Hyperlipidemia, unspecified: Secondary | ICD-10-CM

## 2013-05-19 DIAGNOSIS — R0989 Other specified symptoms and signs involving the circulatory and respiratory systems: Secondary | ICD-10-CM

## 2013-05-19 DIAGNOSIS — R0600 Dyspnea, unspecified: Secondary | ICD-10-CM

## 2013-05-19 DIAGNOSIS — I10 Essential (primary) hypertension: Secondary | ICD-10-CM

## 2013-05-19 MED ORDER — METOPROLOL SUCCINATE ER 25 MG PO TB24: 25.0000 mg | ORAL_TABLET | Freq: Every day | ORAL | Status: DC

## 2013-05-19 NOTE — Progress Notes (Signed)
History of Present Illness: 63 y.o. male with a history of CAD, DM, HTN, hyperlipidemia who is here today for cardiac follow up. He presented to the hospital on 05/14/10 with chest pain and ruled out for MI. Stress myoview was positive for anteroseptal ischemia and EF of 52%. Cardiac catheterization demonstrated single vessel CAD with a mid LAD stenosis of 90%. This was treated with a 3.0 x 16 mm Promus Element DES. He was placed on aspirin and Effient. I saw him for the first time in the office in June 2012. He described awareness of skipped beats at the initial visit. I had him wear a 48 hour Holter monitor. This showed rare PVCs and PACs. Ad[facility] to [facility] 2013 with chest pain. Cardiac cath May 2013 with stable CAD, patent LAD stent.   He is here today for follow up. He has had no chest pain. He does describe dyspnea with exertion. He has been started on a Z-pack yesterday by Dr. Elease Hashimoto for sinus infection. No palpitations, near syncope or syncope. He has no exertional chest pain. Seems to get SOB when carrying heavy objects and when pushing himself.   Primary care: Dr. Elease Hashimoto.   Last Lipid Profile:Lipid Panel     Component Value Date/Time   CHOL 113 10/02/2011 0900   TRIG 110.0 10/02/2011 0900   HDL 30.20* 10/02/2011 0900   CHOLHDL 4 10/02/2011 0900   VLDL 22.0 10/02/2011 0900   LDLCALC 61 10/02/2011 0900     Past Medical History  Diagnosis Date  . DIABETES MELLITUS, TYPE II 06/08/2008  . HYPERLIPIDEMIA 06/08/2008  . HYPERTENSION 06/08/2008  . ASTHMA 06/08/2008  . Impotence of organic origin 08/24/2008  . Chronic kidney disease   . Hemorrhoids   . Colonic polyp   . CAD (coronary artery disease)     a. s/p Promus DES (3.0 x 16 mm) to mid LAD 05/16/10;  b. Myoview 05/15/10: anteroseptal ischemia with EF 52%;   c. Cath 05/16/10: single vessel CAD with mLAD 90% tx with PCI and EF 50%;  d. 07/2011 Cath:  Patent stent, nonobs dzs, NL EF.  Marland Kitchen Myocardial infarction 04/2010  . Pneumonia     hx  of  . GERD (gastroesophageal reflux disease)     Past Surgical History  Procedure Laterality Date  . Coronary angioplasty with stent placement      Current Outpatient Prescriptions  Medication Sig Dispense Refill  . Ascorbic Acid (VITAMIN C) 1000 MG tablet Take 1,000 mg by mouth 2 (two) times daily.       Marland Kitchen aspirin 81 MG tablet Take 1 tablet (81 mg total) by mouth daily.  30 tablet  0  . azithromycin (ZITHROMAX) 250 MG tablet 2 po today then one daily for 4 days  6 tablet  0  . Biotin 1000 MCG tablet Take 1,000 mcg by mouth 3 (three) times daily.      . Canagliflozin (INVOKANA) 300 MG TABS Take 1 tablet by mouth.      . Cholecalciferol (VITAMIN D) 2000 UNITS CAPS Take 1 capsule by mouth daily.      . Dulaglutide (TRULICITY) 1.5 0000000 SOPN Use 1 pen once weekly.      . fish oil-omega-3 fatty acids 1000 MG capsule Take 1 g by mouth daily.       . fluticasone (FLONASE) 50 MCG/ACT nasal spray Place 2 sprays into the nose daily as needed. For pollen allergies      . glucagon (GLUCAGON EMERGENCY) 1 MG injection  Inject 1 mg into the vein once as needed. For low blood sugar      . Insulin Human (INSULIN PUMP) 100 unit/ml SOLN Inject into the skin continuous. novolog: 75 - 80 units per day.      . lisinopril-hydrochlorothiazide (PRINZIDE,ZESTORETIC) 20-12.5 MG per tablet Take 1 tablet by mouth daily.  90 tablet  3  . metFORMIN (GLUCOPHAGE) 500 MG tablet Take 2 tablets (1,000 mg total) by mouth 2 (two) times daily with a meal.      . metoprolol succinate (TOPROL-XL) 25 MG 24 hr tablet TAKE 1 TABLET DAILY  90 tablet  0  . metoprolol succinate (TOPROL-XL) 25 MG 24 hr tablet TAKE 1 TABLET DAILY (NO FURTHER REFILLS WILL BE GRANTED WITHOUT AN APPOINTMENT)  90 tablet  0  . Multiple Vitamins-Minerals (ICAPS PO) Take 1 tablet by mouth every evening.      . nitroGLYCERIN (NITROSTAT) 0.4 MG SL tablet Place 0.4 mg under the tongue every 5 (five) minutes as needed. For chest pain      . NOVOLOG 100 UNIT/ML  injection       . simvastatin (ZOCOR) 20 MG tablet TAKE 1 TABLET EVERY EVENING  90 tablet  2  . tadalafil (CIALIS) 20 MG tablet For erectile dysfunction every other day prn  18 tablet  3  . Testosterone 20.25 MG/ACT (1.62%) GEL Place 4 Act onto the skin every morning. Please send pt a 3 month supply, thank you  75 g  3  . vitamin E 400 UNIT capsule Take 400 Units by mouth every evening.      Marland Kitchen HUMALOG 100 UNIT/ML injection        No current facility-administered medications for this visit.    No Known Allergies  History   Social History  . Marital Status: Married    Spouse Name: N/A    Number of Children: N/A  . Years of Education: N/A   Occupational History  . Not on file.   Social History Main Topics  . Smoking status: Former Smoker -- 1.00 packs/day for 35 years    Types: Cigarettes    Quit date: 05/23/2002  . Smokeless tobacco: Never Used  . Alcohol Use: Yes     Comment: daily  . Drug Use: No  . Sexual Activity: Not Currently   Other Topics Concern  . Not on file   Social History Narrative  . No narrative on file    Family History  Problem Relation Age of Onset  . Diabetes Father   . Heart disease Father     Review of Systems:  As stated in the HPI and otherwise negative.   BP 148/70  Pulse 95  Ht 5\' 10"  (1.778 m)  Wt 255 lb 12.8 oz (116.03 kg)  BMI 36.70 kg/m2  SpO2 96%  Physical Examination: General: Well developed, well nourished, NAD HEENT: OP clear, mucus membranes moist SKIN: warm, dry. No rashes. Neuro: No focal deficits Musculoskeletal: Muscle strength 5/5 all ext Psychiatric: Mood and affect normal Neck: No JVD, no carotid bruits, no thyromegaly, no lymphadenopathy. Lungs:Clear bilaterally, no wheezes, rhonci, crackles Cardiovascular: Regular rate and rhythm. No murmurs, gallops or rubs. Abdomen:Soft. Bowel sounds present. Non-tender.  Extremities: No lower extremity edema. Pulses are 2 + in the bilateral DP/PT.  Cardiac cath May  2013: Left main: No obstructive disease.  Left Anterior Descending Artery: Large vessel that courses to the apex. Patent stent mid vessel with no restenosis. Both diagonal branches are patent.  Circumflex Artery: Large  caliber vessel with early intermediate branch followed by two moderate sized OM branches. No obstructive disease noted.  Right Coronary Artery: Small non-dominant vessel with no obstructive disease noted.  Left Ventricular Angiogram: LVEF 60%.    Assessment and Plan:   1. CAD: Cath May 2013 with stable disease. Recent dyspnea. No chest pain. Will arrange stress myoview to exclude ischemia. Continue ASA, statin, beta blocker. Based on findings in the DAPT trial, he would benefit from addition of long term Plavix. I will discuss at next visit.   2. HTN: BP has been controlled. Slightly elevated today but has been well controlled per pt. No changes.   3. Hyperlipidemia: He is on a statin. Lipids well controlled. Lipids scanned in from [facility] 09/30/12 LDL 28, Total chol 128  4. Dyspnea: Likely multi-factorial with obesity, physical deconditioning and recent sinus congestion. He is known to have CAD so will arrange exercise stress myoview to exclude ischemia.

## 2013-05-19 NOTE — Patient Instructions (Signed)
Your physician recommends that you schedule a follow-up appointment in:  About 6 weeks.   Your physician has requested that you have an exercise stress myoview. For further information please visit [email or web address]. Please follow instruction sheet, as given.

## 2013-05-30 ENCOUNTER — Ambulatory Visit (HOSPITAL_COMMUNITY): Payer: BC Managed Care – PPO | Attending: Cardiovascular Disease | Admitting: Radiology

## 2013-05-30 VITALS — BP 141/76 | Ht 70.0 in | Wt 254.0 lb

## 2013-05-30 DIAGNOSIS — I2510 Atherosclerotic heart disease of native coronary artery without angina pectoris: Secondary | ICD-10-CM | POA: Insufficient documentation

## 2013-05-30 DIAGNOSIS — R0609 Other forms of dyspnea: Secondary | ICD-10-CM | POA: Insufficient documentation

## 2013-05-30 DIAGNOSIS — R0600 Dyspnea, unspecified: Secondary | ICD-10-CM

## 2013-05-30 DIAGNOSIS — R0989 Other specified symptoms and signs involving the circulatory and respiratory systems: Secondary | ICD-10-CM | POA: Insufficient documentation

## 2013-05-30 MED ORDER — REGADENOSON 0.4 MG/5ML IV SOLN
0.4000 mg | Freq: Once | INTRAVENOUS | Status: AC
  Administered 2013-05-30: 0.4 mg via INTRAVENOUS

## 2013-05-30 MED ORDER — TECHNETIUM TC 99M SESTAMIBI GENERIC - CARDIOLITE
10.0000 | Freq: Once | INTRAVENOUS | Status: AC | PRN
  Administered 2013-05-30: 10 via INTRAVENOUS

## 2013-05-30 MED ORDER — TECHNETIUM TC 99M SESTAMIBI GENERIC - CARDIOLITE
30.0000 | Freq: Once | INTRAVENOUS | Status: AC | PRN
  Administered 2013-05-30: 30 via INTRAVENOUS

## 2013-05-30 NOTE — Progress Notes (Signed)
[place], [place] [address] [phone]    Cardiology Nuclear Med Study  Joel Williams is a 63 y.o. male     MRN : [id number]     DOB: [birth year]  Procedure Date: 05/30/2013  Nuclear Med Background Indication for Stress Test:  Evaluation for Ischemia and Stent Patency History:  Asthma and 2012 MPI: EF: 52% Anterior-Cath-Stent-LAD Cardiac Risk Factors: Family History - CAD, History of Smoking, Hypertension, IDDM, and Lipids  Symptoms:  DOE, Palpitations and SOB   Nuclear Pre-Procedure Caffeine/Decaff Intake:  None > 12 hrs NPO After: 7:00pm   Lungs:  clear O2 Sat: 96% on room air. IV 0.9% NS with Angio Cath:  22g  IV Site: R Hand, tolerated well IV Started by:  Irven Baltimore, RN  Chest Size (in):  48 Cup Size: n/a  Height: 5\' 10"  (1.778 m)  Weight:  254 lb (115.214 kg)  BMI:  Body mass index is 36.45 kg/(m^2). Tech Comments:  Patient has an insulin pump. No insulin bolus or metformin today. Fasting CBG was 125 on arrival ay 0710. Took Prinizide this am. Irven Baltimore, RN.    Nuclear Med Study 1 or 2 day study: 1 day  Stress Test Type:  Lexiscan  Reading MD: N/A  Order Authorizing Provider:  Lauree Chandler, MD  Resting Radionuclide: Technetium 48m Sestamibi  Resting Radionuclide Dose: 10.9 mCi   Stress Radionuclide:  Technetium 47m Sestamibi  Stress Radionuclide Dose: 33.0 mCi           Stress Protocol Rest HR: 74 Stress HR: 90  Rest BP: 141/76 Stress BP: 145/74  Exercise Time (min): n/a METS: n/a   Predicted Max HR: 156 bpm % Max HR: 57.69 bpm Rate Pressure Product: 13050   Dose of Adenosine (mg):  n/a Dose of Lexiscan: 0.4 mg  Dose of Atropine (mg): n/a Dose of Dobutamine: n/a mcg/kg/min (at max HR)  Stress Test Technologist: Perrin Maltese, EMT-P  Nuclear Technologist:  Annye Rusk, CNMT     Rest Procedure:  Myocardial perfusion imaging was performed at rest 45 minutes following the intravenous  administration of Technetium 47m Sestamibi. Rest ECG: [id number]  Stress Procedure:  The patient received IV Lexiscan 0.4 mg over 15-seconds.  Technetium 92m Sestamibi injected at 30-seconds. This patient had chest pain, sob, and was lt. Headed with the [employer]. Quantitative spect images were obtained after a 45 minute delay. Stress ECG: No significant ST segment change suggestive of ischemia.  QPS Raw Data Images:  Acquisition technically good; LVE. Stress Images:  There is decreased uptake in the inferior wall. Rest Images:  There is decreased uptake in the inferior wall, less prominent compared to the stress images. Subtraction (SDS):  These findings are consistent with prior inferior infarct and mild peri-infarct ischemia. Transient Ischemic Dilatation (Normal <1.22):  1.01 Lung/Heart Ratio (Normal <0.45):  0.37  Quantitative Gated Spect Images QGS EDV:  122 ml QGS ESV:  61 ml  Impression Exercise Capacity:  Lexiscan with no exercise. BP Response:  Normal blood pressure response. Clinical Symptoms:  There is chest pain and dyspnea. ECG Impression:  No significant ST segment change suggestive of ischemia. Comparison with Prior Nuclear Study: Compared to 05/15/10, anterior defect absent and inferior infarct/ischemia new.  Overall Impression:  Low risk stress nuclear study with a small, moderate intensity, partially reversible inferior defect consistent with prior inferior infarct and mild peri-infarct ischemia.  LV Ejection Fraction: 50%.  LV Wall Motion:  Mild inferior hypokinesis.  [name]

## 2013-06-05 ENCOUNTER — Ambulatory Visit (INDEPENDENT_AMBULATORY_CARE_PROVIDER_SITE_OTHER): Payer: BC Managed Care – PPO | Admitting: Cardiovascular Disease

## 2013-06-05 ENCOUNTER — Encounter: Payer: Self-pay | Admitting: Cardiovascular Disease

## 2013-06-05 ENCOUNTER — Encounter: Payer: Self-pay | Admitting: *Deleted

## 2013-06-05 VITALS — BP 150/82 | HR 80 | Ht 70.0 in | Wt 259.2 lb

## 2013-06-05 DIAGNOSIS — I2510 Atherosclerotic heart disease of native coronary artery without angina pectoris: Secondary | ICD-10-CM

## 2013-06-05 DIAGNOSIS — R0989 Other specified symptoms and signs involving the circulatory and respiratory systems: Secondary | ICD-10-CM

## 2013-06-05 DIAGNOSIS — E785 Hyperlipidemia, unspecified: Secondary | ICD-10-CM

## 2013-06-05 DIAGNOSIS — R0609 Other forms of dyspnea: Secondary | ICD-10-CM

## 2013-06-05 DIAGNOSIS — R0600 Dyspnea, unspecified: Secondary | ICD-10-CM

## 2013-06-05 DIAGNOSIS — I10 Essential (primary) hypertension: Secondary | ICD-10-CM

## 2013-06-05 LAB — CBC WITH DIFFERENTIAL/PLATELET
BASOS PCT: 0.5 % (ref 0.0–3.0)
Eosinophils Relative: 1.7 % (ref 0.0–5.0)
HCT: 43 % (ref 39.0–52.0)
HEMOGLOBIN: 14.4 g/dL (ref 13.0–17.0)
Lymphocytes Relative: 31.5 % (ref 12.0–46.0)
MCHC: 33.5 g/dL (ref 30.0–36.0)
MCV: 89.1 fl (ref 78.0–100.0)
Monocytes Relative: 11.6 % (ref 3.0–12.0)
Neutrophils Relative %: 54.7 % (ref 43.0–77.0)
Platelets: 314 10*3/uL (ref 150.0–400.0)
RBC: 4.83 Mil/uL (ref 4.22–5.81)
RDW: 14 % (ref 11.5–14.6)
WBC: 4.6 10*3/uL (ref 4.5–10.5)

## 2013-06-05 LAB — PROTIME-INR
INR: 1.1 ratio — ABNORMAL HIGH (ref 0.8–1.0)
PROTHROMBIN TIME: 11.9 s (ref 10.2–12.4)

## 2013-06-05 LAB — BASIC METABOLIC PANEL
BUN: 20 mg/dL (ref 6–23)
CO2: 32 meq/L (ref 19–32)
CREATININE: 0.8 mg/dL (ref 0.4–1.5)
Calcium: 9.5 mg/dL (ref 8.4–10.5)
Chloride: 102 mEq/L (ref 96–112)
GFR: 100.51 mL/min (ref >60.00)
Glucose, Bld: 198 mg/dL — ABNORMAL HIGH (ref 70–99)
Potassium: 4.3 mEq/L (ref 3.5–5.1)
SODIUM: 140 meq/L (ref 135–145)

## 2013-06-05 MED ORDER — CLOPIDOGREL BISULFATE 75 MG PO TABS: 75.0000 mg | ORAL_TABLET | Freq: Every day | ORAL | Status: DC

## 2013-06-05 NOTE — Patient Instructions (Signed)
Your physician recommends that you schedule a follow-up appointment in: Keep scheduled appt with Dr. [name] on July 03, 2013  Your physician has recommended you make the following change in your medication:  Start Clopidogrel 75 mg by mouth daily.  Your physician has requested that you have a cardiac catheterization. Cardiac catheterization is used to diagnose and/or treat various heart conditions. Doctors may recommend this procedure for a number of different reasons. The most common reason is to evaluate chest pain. Chest pain can be a symptom of coronary artery disease (CAD), and cardiac catheterization can show whether plaque is narrowing or blocking your heart's arteries. This procedure is also used to evaluate the valves, as well as measure the blood flow and oxygen levels in different parts of your heart. For further information please visit [email or web address]. Please follow instruction sheet, as given. Scheduled for June 14, 2013

## 2013-06-05 NOTE — Progress Notes (Signed)
History of Present Illness: 63 y.o. male with a history of CAD, DM, HTN, hyperlipidemia who is here today for cardiac follow up. He presented to the hospital on 05/14/10 with chest pain and ruled out for MI. Stress myoview was positive for anteroseptal ischemia and EF of 52%. Cardiac catheterization demonstrated single vessel CAD with a mid LAD stenosis of 90%. This was treated with a 3.0 x 16 mm Promus Element DES. He was placed on aspirin and Effient. I saw him for the first time in the office in June 2012. He described awareness of skipped beats at the initial visit. I had him wear a 48 hour Holter monitor. This showed rare PVCs and PACs. Admitted to [facility] May 2013 with chest pain. Cardiac cath May 2013 with stable CAD, patent LAD stent. He was seen in my office 05/19/13 and had c/o dyspnea with exertion, no chest pain. Lexiscan stress myoview 05/30/13 with LVEF=50%, possible inferior wall ischemia.   He is here today for follow up. He is still having dyspnea with exertion. No chest pain.   Primary care: Dr. Elease Hashimoto.   Last Lipid Profile:Lipid Panel     Component Value Date/Time   CHOL 113 10/02/2011 0900   TRIG 110.0 10/02/2011 0900   HDL 30.20* 10/02/2011 0900   CHOLHDL 4 10/02/2011 0900   VLDL 22.0 10/02/2011 0900   LDLCALC 61 10/02/2011 0900     Past Medical History  Diagnosis Date  . DIABETES MELLITUS, TYPE II 06/08/2008  . HYPERLIPIDEMIA 06/08/2008  . HYPERTENSION 06/08/2008  . ASTHMA 06/08/2008  . Impotence of organic origin 08/24/2008  . Chronic kidney disease   . Hemorrhoids   . Colonic polyp   . CAD (coronary artery disease)     a. s/p Promus DES (3.0 x 16 mm) to mid LAD 05/16/10;  b. Myoview 05/15/10: anteroseptal ischemia with EF 52%;   c. Cath 05/16/10: single vessel CAD with mLAD 90% tx with PCI and EF 50%;  d. 07/2011 Cath:  Patent stent, nonobs dzs, NL EF.  Marland Kitchen Myocardial infarction 04/2010  . Pneumonia     hx of  . GERD (gastroesophageal reflux disease)     Past Surgical  History  Procedure Laterality Date  . Coronary angioplasty with stent placement      Current Outpatient Prescriptions  Medication Sig Dispense Refill  . Amoxicillin-Pot Clavulanate (AUGMENTIN PO) Take by mouth.      . Ascorbic Acid (VITAMIN C) 1000 MG tablet Take 1,000 mg by mouth 2 (two) times daily.       Marland Kitchen aspirin 81 MG tablet Take 1 tablet (81 mg total) by mouth daily.  30 tablet  0  . Biotin 1000 MCG tablet Take 1,000 mcg by mouth 3 (three) times daily.      . Cholecalciferol (VITAMIN D) 2000 UNITS CAPS Take 1 capsule by mouth daily.      . Dulaglutide (TRULICITY) 1.5 IR/4.4RX SOPN Use 1 pen once weekly.      . fish oil-omega-3 fatty acids 1000 MG capsule Take 1 g by mouth daily.       . fluticasone (FLONASE) 50 MCG/ACT nasal spray Place 2 sprays into the nose daily as needed. For pollen allergies      . glucagon (GLUCAGON EMERGENCY) 1 MG injection Inject 1 mg into the vein once as needed. For low blood sugar      . HUMALOG 100 UNIT/ML injection       . Insulin Human (INSULIN PUMP) 100 unit/ml SOLN  Inject into the skin continuous. novolog: 75 - 80 units per day.      . lisinopril-hydrochlorothiazide (PRINZIDE,ZESTORETIC) 20-12.5 MG per tablet Take 1 tablet by mouth daily.  90 tablet  3  . metFORMIN (GLUCOPHAGE) 500 MG tablet Take 2 tablets (1,000 mg total) by mouth 2 (two) times daily with a meal.      . metoprolol succinate (TOPROL-XL) 25 MG 24 hr tablet Take 1 tablet (25 mg total) by mouth daily.  90 tablet  0  . Multiple Vitamins-Minerals (ICAPS PO) Take 1 tablet by mouth every evening.      . nitroGLYCERIN (NITROSTAT) 0.4 MG SL tablet Place 0.4 mg under the tongue every 5 (five) minutes as needed. For chest pain      . simvastatin (ZOCOR) 20 MG tablet TAKE 1 TABLET EVERY EVENING  90 tablet  2  . tadalafil (CIALIS) 20 MG tablet For erectile dysfunction every other day prn  18 tablet  3  . vitamin E 400 UNIT capsule Take 400 Units by mouth every evening.      . Vitamins-Lipotropics  (LIPOFLAVONOID PO) Take by mouth. For your ear       No current facility-administered medications for this visit.    No Known Allergies  History   Social History  . Marital Status: Married    Spouse Name: N/A    Number of Children: N/A  . Years of Education: N/A   Occupational History  . Not on file.   Social History Main Topics  . Smoking status: Former Smoker -- 1.00 packs/day for 35 years    Types: Cigarettes    Quit date: 05/23/2002  . Smokeless tobacco: Never Used  . Alcohol Use: Yes     Comment: daily  . Drug Use: No  . Sexual Activity: Not Currently   Other Topics Concern  . Not on file   Social History Narrative  . No narrative on file    Family History  Problem Relation Age of Onset  . Diabetes Father   . Heart disease Father     Review of Systems:  As stated in the HPI and otherwise negative.   BP 150/82  Pulse 80  Ht 5\' 10"  (1.778 m)  Wt 259 lb 3.2 oz (117.572 kg)  BMI 37.19 kg/m2  Physical Examination: General: Well developed, well nourished, NAD HEENT: OP clear, mucus membranes moist SKIN: warm, dry. No rashes. Neuro: No focal deficits Musculoskeletal: Muscle strength 5/5 all ext Psychiatric: Mood and affect normal Neck: No JVD, no carotid bruits, no thyromegaly, no lymphadenopathy. Lungs:Clear bilaterally, no wheezes, rhonci, crackles Cardiovascular: Regular rate and rhythm. No murmurs, gallops or rubs. Abdomen:Soft. Bowel sounds present. Non-tender.  Extremities: No lower extremity edema. Pulses are 2 + in the bilateral DP/PT.  Cardiac cath May 2013: Left main: No obstructive disease.  Left Anterior Descending Artery: Large vessel that courses to the apex. Patent stent mid vessel with no restenosis. Both diagonal branches are patent.  Circumflex Artery: Large caliber vessel with early intermediate branch followed by two moderate sized OM branches. No obstructive disease noted.  Right Coronary Artery: Small non-dominant vessel with no  obstructive disease noted.  Left Ventricular Angiogram: LVEF 60%.   Lexiscan stress myoview 05/30/13: Stress Procedure: The patient received IV Lexiscan 0.4 mg over 15-seconds. Technetium 3m Sestamibi injected at 30-seconds. This patient had chest pain, sob, and was lt. Headed with the [employer]. Quantitative spect images were obtained after a 45 minute delay.  Stress ECG: No significant ST segment  change suggestive of ischemia.  QPS  Raw Data Images: Acquisition technically good; LVE.  Stress Images: There is decreased uptake in the inferior wall.  Rest Images: There is decreased uptake in the inferior wall, less prominent compared to the stress images.  Subtraction (SDS): These findings are consistent with prior inferior infarct and mild peri-infarct ischemia.  Transient Ischemic Dilatation (Normal <1.22): 1.01  Lung/Heart Ratio (Normal <0.45): 0.37  Quantitative Gated Spect Images  QGS EDV: 122 ml  QGS ESV: 61 ml  Impression  Exercise Capacity: Lexiscan with no exercise.  BP Response: Normal blood pressure response.  Clinical Symptoms: There is chest pain and dyspnea.  ECG Impression: No significant ST segment change suggestive of ischemia.  Comparison with Prior Nuclear Study: Compared to 05/15/10, anterior defect absent and inferior infarct/ischemia new.  Overall Impression: Low risk stress nuclear study with a small, moderate intensity, partially reversible inferior defect consistent with prior inferior infarct and mild peri-infarct ischemia.  LV Ejection Fraction: 50%. LV Wall Motion: Mild inferior hypokinesis.  Assessment and Plan:   1. CAD: Cath May 2013 with stable disease. Recent dyspnea. No chest pain. Lexiscan stress myoview suggests inferior wall ischemia. Will plan cardiac cath 06/14/13 at [facility]. Risks and benefits reviewed with pt. Pre-cath labs today.  Continue ASA, statin, beta blocker. Will restart Plavix 75 mg po Qdaily.   2. HTN: BP has been controlled. Slightly elevated  today but has been well controlled per pt. No changes.   3. Hyperlipidemia: He is on a statin. Lipids well controlled. Lipids scanned in from [facility] 09/30/12 LDL 28, Total chol 128  4. Dyspnea: Likely multi-factorial with obesity, physical deconditioning but concern for ischemia based on stress test. Plan cath as above.

## 2013-06-08 ENCOUNTER — Encounter (HOSPITAL_COMMUNITY): Payer: Self-pay

## 2013-06-14 ENCOUNTER — Encounter (HOSPITAL_COMMUNITY)
Admission: RE | Disposition: A | Discharge status: Home / Self Care | Payer: Self-pay | Source: Ambulatory Visit | Attending: Cardiovascular Disease

## 2013-06-14 ENCOUNTER — Ambulatory Visit (HOSPITAL_COMMUNITY)
Admission: RE | Admit: 2013-06-14 | Discharge: 2013-06-14 | Disposition: A | Discharge status: Home / Self Care | Payer: BC Managed Care – PPO | Source: Ambulatory Visit | Attending: Cardiovascular Disease | Admitting: Cardiovascular Disease

## 2013-06-14 DIAGNOSIS — I129 Hypertensive chronic kidney disease with stage 1 through stage 4 chronic kidney disease, or unspecified chronic kidney disease: Secondary | ICD-10-CM | POA: Insufficient documentation

## 2013-06-14 DIAGNOSIS — Z87891 Personal history of nicotine dependence: Secondary | ICD-10-CM | POA: Insufficient documentation

## 2013-06-14 DIAGNOSIS — Z860100 Personal history of colon polyps, unspecified: Secondary | ICD-10-CM | POA: Insufficient documentation

## 2013-06-14 DIAGNOSIS — K649 Unspecified hemorrhoids: Secondary | ICD-10-CM | POA: Insufficient documentation

## 2013-06-14 DIAGNOSIS — I259 Chronic ischemic heart disease, unspecified: Secondary | ICD-10-CM | POA: Insufficient documentation

## 2013-06-14 DIAGNOSIS — N189 Chronic kidney disease, unspecified: Secondary | ICD-10-CM | POA: Insufficient documentation

## 2013-06-14 DIAGNOSIS — R0989 Other specified symptoms and signs involving the circulatory and respiratory systems: Principal | ICD-10-CM | POA: Insufficient documentation

## 2013-06-14 DIAGNOSIS — Z7982 Long term (current) use of aspirin: Secondary | ICD-10-CM | POA: Insufficient documentation

## 2013-06-14 DIAGNOSIS — E119 Type 2 diabetes mellitus without complications: Secondary | ICD-10-CM | POA: Insufficient documentation

## 2013-06-14 DIAGNOSIS — R0602 Shortness of breath: Secondary | ICD-10-CM

## 2013-06-14 DIAGNOSIS — I252 Old myocardial infarction: Secondary | ICD-10-CM | POA: Insufficient documentation

## 2013-06-14 DIAGNOSIS — Z9641 Presence of insulin pump (external) (internal): Secondary | ICD-10-CM | POA: Insufficient documentation

## 2013-06-14 DIAGNOSIS — J45909 Unspecified asthma, uncomplicated: Secondary | ICD-10-CM | POA: Insufficient documentation

## 2013-06-14 DIAGNOSIS — R0609 Other forms of dyspnea: Secondary | ICD-10-CM | POA: Insufficient documentation

## 2013-06-14 DIAGNOSIS — N529 Male erectile dysfunction, unspecified: Secondary | ICD-10-CM | POA: Insufficient documentation

## 2013-06-14 DIAGNOSIS — E785 Hyperlipidemia, unspecified: Secondary | ICD-10-CM | POA: Insufficient documentation

## 2013-06-14 DIAGNOSIS — K219 Gastro-esophageal reflux disease without esophagitis: Secondary | ICD-10-CM | POA: Insufficient documentation

## 2013-06-14 DIAGNOSIS — I2510 Atherosclerotic heart disease of native coronary artery without angina pectoris: Secondary | ICD-10-CM | POA: Insufficient documentation

## 2013-06-14 DIAGNOSIS — Z9861 Coronary angioplasty status: Secondary | ICD-10-CM | POA: Insufficient documentation

## 2013-06-14 DIAGNOSIS — Z794 Long term (current) use of insulin: Secondary | ICD-10-CM | POA: Insufficient documentation

## 2013-06-14 HISTORY — PX: LEFT HEART CATHETERIZATION WITH CORONARY ANGIOGRAM: SHX5451

## 2013-06-14 LAB — GLUCOSE, CAPILLARY
GLUCOSE-CAPILLARY: 137 mg/dL — AB (ref 70–99)
Glucose-Capillary: 120 mg/dL — ABNORMAL HIGH (ref 70–99)

## 2013-06-14 SURGERY — LEFT HEART CATHETERIZATION WITH CORONARY ANGIOGRAM
Anesthesia: LOCAL

## 2013-06-14 MED ORDER — FENTANYL CITRATE 0.05 MG/ML IJ SOLN
INTRAMUSCULAR | Status: AC
  Filled 2013-06-14: qty 2

## 2013-06-14 MED ORDER — DIAZEPAM 5 MG PO TABS
5.0000 mg | ORAL_TABLET | ORAL | Status: AC
  Administered 2013-06-14: 5 mg via ORAL

## 2013-06-14 MED ORDER — SODIUM CHLORIDE 0.9 % IJ SOLN: 3.0000 mL | INTRAMUSCULAR | Status: DC | PRN

## 2013-06-14 MED ORDER — ASPIRIN 81 MG PO CHEW
CHEWABLE_TABLET | ORAL | Status: AC
  Filled 2013-06-14: qty 1

## 2013-06-14 MED ORDER — SODIUM CHLORIDE 0.9 % IJ SOLN: 3.0000 mL | Freq: Two times a day (BID) | INTRAMUSCULAR | Status: DC

## 2013-06-14 MED ORDER — SODIUM CHLORIDE 0.9 % IV SOLN: INTRAVENOUS | Status: AC

## 2013-06-14 MED ORDER — HEPARIN (PORCINE) IN NACL 2-0.9 UNIT/ML-% IJ SOLN
INTRAMUSCULAR | Status: AC
  Filled 2013-06-14: qty 1000

## 2013-06-14 MED ORDER — DIAZEPAM 5 MG PO TABS
ORAL_TABLET | ORAL | Status: AC
  Administered 2013-06-14: 5 mg via ORAL
  Filled 2013-06-14: qty 1

## 2013-06-14 MED ORDER — VERAPAMIL HCL 2.5 MG/ML IV SOLN
INTRAVENOUS | Status: AC
  Filled 2013-06-14: qty 2

## 2013-06-14 MED ORDER — LIDOCAINE HCL (PF) 1 % IJ SOLN
INTRAMUSCULAR | Status: AC
  Filled 2013-06-14: qty 30

## 2013-06-14 MED ORDER — MIDAZOLAM HCL 2 MG/2ML IJ SOLN
INTRAMUSCULAR | Status: AC
  Filled 2013-06-14: qty 2

## 2013-06-14 MED ORDER — SODIUM CHLORIDE 0.9 % IV SOLN
INTRAVENOUS | Status: DC
  Administered 2013-06-14: 09:00:00 via INTRAVENOUS

## 2013-06-14 MED ORDER — SODIUM CHLORIDE 0.9 % IV SOLN: 250.0000 mL | INTRAVENOUS | Status: DC | PRN

## 2013-06-14 MED ORDER — ASPIRIN 81 MG PO CHEW: 81.0000 mg | CHEWABLE_TABLET | ORAL | Status: DC

## 2013-06-14 MED ORDER — NITROGLYCERIN 0.2 MG/ML ON CALL CATH LAB
INTRAVENOUS | Status: AC
  Filled 2013-06-14: qty 1

## 2013-06-14 MED ORDER — HEPARIN SODIUM (PORCINE) 1000 UNIT/ML IJ SOLN
INTRAMUSCULAR | Status: AC
  Filled 2013-06-14: qty 1

## 2013-06-14 NOTE — H&P (View-Only) (Signed)
   History of Present Illness: 63 y.o. male with a history of CAD, DM, HTN, hyperlipidemia who is here today for cardiac follow up. He presented to the hospital on 05/14/10 with chest pain and ruled out for MI. Stress myoview was positive for anteroseptal ischemia and EF of 52%. Cardiac catheterization demonstrated single vessel CAD with a mid LAD stenosis of 90%. This was treated with a 3.0 x 16 mm Promus Element DES. He was placed on aspirin and Effient. I saw him for the first time in the office in June 2012. He described awareness of skipped beats at the initial visit. I had him wear a 48 hour Holter monitor. This showed rare PVCs and PACs. Admitted to [facility] May 2013 with chest pain. Cardiac cath May 2013 with stable CAD, patent LAD stent. He was seen in my office 05/19/13 and had c/o dyspnea with exertion, no chest pain. Lexiscan stress myoview 05/30/13 with LVEF=50%, possible inferior wall ischemia.   He is here today for follow up. He is still having dyspnea with exertion. No chest pain.   Primary care: Dr. Burchette.   Last Lipid Profile:Lipid Panel     Component Value Date/Time   CHOL 113 10/02/2011 0900   TRIG 110.0 10/02/2011 0900   HDL 30.20* 10/02/2011 0900   CHOLHDL 4 10/02/2011 0900   VLDL 22.0 10/02/2011 0900   LDLCALC 61 10/02/2011 0900     Past Medical History  Diagnosis Date  . DIABETES MELLITUS, TYPE II 06/08/2008  . HYPERLIPIDEMIA 06/08/2008  . HYPERTENSION 06/08/2008  . ASTHMA 06/08/2008  . Impotence of organic origin 08/24/2008  . Chronic kidney disease   . Hemorrhoids   . Colonic polyp   . CAD (coronary artery disease)     a. s/p Promus DES (3.0 x 16 mm) to mid LAD 05/16/10;  b. Myoview 05/15/10: anteroseptal ischemia with EF 52%;   c. Cath 05/16/10: single vessel CAD with mLAD 90% tx with PCI and EF 50%;  d. 07/2011 Cath:  Patent stent, nonobs dzs, NL EF.  . Myocardial infarction 04/2010  . Pneumonia     hx of  . GERD (gastroesophageal reflux disease)     Past Surgical  History  Procedure Laterality Date  . Coronary angioplasty with stent placement      Current Outpatient Prescriptions  Medication Sig Dispense Refill  . Amoxicillin-Pot Clavulanate (AUGMENTIN PO) Take by mouth.      . Ascorbic Acid (VITAMIN C) 1000 MG tablet Take 1,000 mg by mouth 2 (two) times daily.       . aspirin 81 MG tablet Take 1 tablet (81 mg total) by mouth daily.  30 tablet  0  . Biotin 1000 MCG tablet Take 1,000 mcg by mouth 3 (three) times daily.      . Cholecalciferol (VITAMIN D) 2000 UNITS CAPS Take 1 capsule by mouth daily.      . Dulaglutide (TRULICITY) 1.5 MG/0.5ML SOPN Use 1 pen once weekly.      . fish oil-omega-3 fatty acids 1000 MG capsule Take 1 g by mouth daily.       . fluticasone (FLONASE) 50 MCG/ACT nasal spray Place 2 sprays into the nose daily as needed. For pollen allergies      . glucagon (GLUCAGON EMERGENCY) 1 MG injection Inject 1 mg into the vein once as needed. For low blood sugar      . HUMALOG 100 UNIT/ML injection       . Insulin Human (INSULIN PUMP) 100 unit/ml SOLN   Inject into the skin continuous. novolog: 75 - 80 units per day.      . lisinopril-hydrochlorothiazide (PRINZIDE,ZESTORETIC) 20-12.5 MG per tablet Take 1 tablet by mouth daily.  90 tablet  3  . metFORMIN (GLUCOPHAGE) 500 MG tablet Take 2 tablets (1,000 mg total) by mouth 2 (two) times daily with a meal.      . metoprolol succinate (TOPROL-XL) 25 MG 24 hr tablet Take 1 tablet (25 mg total) by mouth daily.  90 tablet  0  . Multiple Vitamins-Minerals (ICAPS PO) Take 1 tablet by mouth every evening.      . nitroGLYCERIN (NITROSTAT) 0.4 MG SL tablet Place 0.4 mg under the tongue every 5 (five) minutes as needed. For chest pain      . simvastatin (ZOCOR) 20 MG tablet TAKE 1 TABLET EVERY EVENING  90 tablet  2  . tadalafil (CIALIS) 20 MG tablet For erectile dysfunction every other day prn  18 tablet  3  . vitamin E 400 UNIT capsule Take 400 Units by mouth every evening.      . Vitamins-Lipotropics  (LIPOFLAVONOID PO) Take by mouth. For your ear       No current facility-administered medications for this visit.    No Known Allergies  History   Social History  . Marital Status: Married    Spouse Name: N/A    Number of Children: N/A  . Years of Education: N/A   Occupational History  . Not on file.   Social History Main Topics  . Smoking status: Former Smoker -- 1.00 packs/day for 35 years    Types: Cigarettes    Quit date: 05/23/2002  . Smokeless tobacco: Never Used  . Alcohol Use: Yes     Comment: daily  . Drug Use: No  . Sexual Activity: Not Currently   Other Topics Concern  . Not on file   Social History Narrative  . No narrative on file    Family History  Problem Relation Age of Onset  . Diabetes Father   . Heart disease Father     Review of Systems:  As stated in the HPI and otherwise negative.   BP 150/82  Pulse 80  Ht 5\' 10"  (1.778 m)  Wt 259 lb 3.2 oz (117.572 kg)  BMI 37.19 kg/m2  Physical Examination: General: Well developed, well nourished, NAD HEENT: OP clear, mucus membranes moist SKIN: warm, dry. No rashes. Neuro: No focal deficits Musculoskeletal: Muscle strength 5/5 all ext Psychiatric: Mood and affect normal Neck: No JVD, no carotid bruits, no thyromegaly, no lymphadenopathy. Lungs:Clear bilaterally, no wheezes, rhonci, crackles Cardiovascular: Regular rate and rhythm. No murmurs, gallops or rubs. Abdomen:Soft. Bowel sounds present. Non-tender.  Extremities: No lower extremity edema. Pulses are 2 + in the bilateral DP/PT.  Cardiac cath May 2013: Left main: No obstructive disease.  Left Anterior Descending Artery: Large vessel that courses to the apex. Patent stent mid vessel with no restenosis. Both diagonal branches are patent.  Circumflex Artery: Large caliber vessel with early intermediate branch followed by two moderate sized OM branches. No obstructive disease noted.  Right Coronary Artery: Small non-dominant vessel with no  obstructive disease noted.  Left Ventricular Angiogram: LVEF 60%.   Lexiscan stress myoview 05/30/13: Stress Procedure: The patient received IV Lexiscan 0.4 mg over 15-seconds. Technetium 3m Sestamibi injected at 30-seconds. This patient had chest pain, sob, and was lt. Headed with the [employer]. Quantitative spect images were obtained after a 45 minute delay.  Stress ECG: No significant ST segment  change suggestive of ischemia.  QPS  Raw Data Images: Acquisition technically good; LVE.  Stress Images: There is decreased uptake in the inferior wall.  Rest Images: There is decreased uptake in the inferior wall, less prominent compared to the stress images.  Subtraction (SDS): These findings are consistent with prior inferior infarct and mild peri-infarct ischemia.  Transient Ischemic Dilatation (Normal <1.22): 1.01  Lung/Heart Ratio (Normal <0.45): 0.37  Quantitative Gated Spect Images  QGS EDV: 122 ml  QGS ESV: 61 ml  Impression  Exercise Capacity: Lexiscan with no exercise.  BP Response: Normal blood pressure response.  Clinical Symptoms: There is chest pain and dyspnea.  ECG Impression: No significant ST segment change suggestive of ischemia.  Comparison with Prior Nuclear Study: Compared to 05/15/10, anterior defect absent and inferior infarct/ischemia new.  Overall Impression: Low risk stress nuclear study with a small, moderate intensity, partially reversible inferior defect consistent with prior inferior infarct and mild peri-infarct ischemia.  LV Ejection Fraction: 50%. LV Wall Motion: Mild inferior hypokinesis.  Assessment and Plan:   1. CAD: Cath May 2013 with stable disease. Recent dyspnea. No chest pain. Lexiscan stress myoview suggests inferior wall ischemia. Will plan cardiac cath 06/14/13 at [facility]. Risks and benefits reviewed with pt. Pre-cath labs today.  Continue ASA, statin, beta blocker. Will restart Plavix 75 mg po Qdaily.   2. HTN: BP has been controlled. Slightly elevated  today but has been well controlled per pt. No changes.   3. Hyperlipidemia: He is on a statin. Lipids well controlled. Lipids scanned in from [facility] 09/30/12 LDL 28, Total chol 128  4. Dyspnea: Likely multi-factorial with obesity, physical deconditioning but concern for ischemia based on stress test. Plan cath as above.  

## 2013-06-14 NOTE — Discharge Instructions (Signed)
Hold metformin for 48 hours post cath.  Radial Site Care Refer to this sheet in the next few weeks. These instructions provide you with information on caring for yourself after your procedure. Your caregiver may also give you more specific instructions. Your treatment has been planned according to current medical practices, but problems sometimes occur. Call your caregiver if you have any problems or questions after your procedure. HOME CARE INSTRUCTIONS  You may shower the day after the procedure.Remove the bandage (dressing) and gently wash the site with plain soap and water.Gently pat the site dry.  Do not apply powder or lotion to the site.  Do not submerge the affected site in water for 3 to 5 days.  Inspect the site at least twice daily.  Do not flex or bend the affected arm for 24 hours.  No lifting over 5 pounds (2.3 kg) for 5 days after your procedure.  Do not drive home if you are discharged the same day of the procedure. Have someone else drive you.  You may drive 24 hours after the procedure unless otherwise instructed by your caregiver.  Do not operate machinery or power tools for 24 hours.  A responsible adult should be with you for the first 24 hours after you arrive home. What to expect:  Any bruising will usually fade within 1 to 2 weeks.  Blood that collects in the tissue (hematoma) may be painful to the touch. It should usually decrease in size and tenderness within 1 to 2 weeks. SEEK IMMEDIATE MEDICAL CARE IF:  You have unusual pain at the radial site.  You have redness, warmth, swelling, or pain at the radial site.  You have drainage (other than a small amount of blood on the dressing).  You have chills.  You have a fever or persistent symptoms for more than 72 hours.  You have a fever and your symptoms suddenly get worse.  Your arm becomes pale, cool, tingly, or numb.  You have heavy bleeding from the site. Hold pressure on the site. Document  Released: 04/04/2010 Document Revised: 05/25/2011 Document Reviewed: 04/04/2010 [facility] Patient Information 2014 [place], [place].

## 2013-06-14 NOTE — Interval H&P Note (Signed)
History and Physical Interval Note:  06/14/2013 10:45 AM  Joel Williams  has presented today for cardiac cath with the diagnosis of dyspnea, abnormal stress test.  The various methods of treatment have been discussed with the patient and family. After consideration of risks, benefits and other options for treatment, the patient has consented to  Procedure(s): LEFT HEART CATHETERIZATION WITH CORONARY ANGIOGRAM (N/A) as a surgical intervention .  The patient's history has been reviewed, patient examined, no change in status, stable for surgery.  I have reviewed the patient's chart and labs.  Questions were answered to the patient's satisfaction.    Cath Lab Visit (complete for each Cath Lab visit)  Clinical Evaluation Leading to the Procedure:   ACS: no  Non-ACS:    Anginal Classification: CCS III  Anti-ischemic medical therapy: Minimal Therapy (1 class of medications)  Non-Invasive Test Results: Low-risk stress test findings: cardiac mortality <1%/year  Prior CABG: No previous CABG        Menashe Kafer

## 2013-06-14 NOTE — CV Procedure (Signed)
      Cardiac Catheterization Operative Report  Joel Williams [id number] 4/1/201511:22 AM [name], MD  Procedure Performed:  1. Left Heart Catheterization 2. Selective Coronary Angiography 3. Left ventricular angiogram  Operator: Lauree Chandler, MD  Arterial access site:  Right radial artery.   Indication:  62 y.o. male with a history of CAD, DM, HTN, hyperlipidemia who is here today for cardiac cath. Known CAD with prior stenting of the LAD.  Cardiac cath May 2013 with stable CAD, patent LAD stent. He was seen in my office 05/19/13 and had c/o dyspnea with exertion, no chest pain. Lexiscan stress myoview 05/30/13 with LVEF=50%, possible inferior wall ischemia.                                    Procedure Details: The risks, benefits, complications, treatment options, and expected outcomes were discussed with the patient. The patient and/or family concurred with the proposed plan, giving informed consent. The patient was brought to the cath lab after IV hydration was begun and oral premedication was given. The patient was further sedated with Versed and Fentanyl. The right wrist was assessed with an Allens test which was positive. The right wrist was prepped and draped in a sterile fashion. 1% lidocaine was used for local anesthesia. Using the modified Seldinger access technique, a 5 French sheath was placed in the right radial artery. 3 mg Verapamil was given through the sheath. 5000 units IV heparin was given. Standard diagnostic catheters were used to perform selective coronary angiography. A pigtail catheter was used to perform a left ventricular angiogram. The sheath was removed from the right radial artery and a Terumo hemostasis band was applied at the arteriotomy site on the right wrist.   There were no immediate complications. The patient was taken to the recovery area in stable condition.   Hemodynamic Findings: Central aortic pressure: 145/75 Left ventricular  pressure: 152/11/20  Angiographic Findings:  Left main: No obstructive disease.   Left Anterior Descending Artery: Moderate to large caliber vessel that courses to the apex. The proximal stent is patent with no restenosis. The mid and distal vessel has no obstructive disease. There are two small caliber diagonal branches with no obstructive disease.   Circumflex Artery: Large caliber vessel with early intermediate branch followed by two moderate sized OM branches. No obstructive disease noted.   Right Coronary Artery: Small non-dominant vessel with no obstructive disease noted.   Left Ventricular Angiogram: LVEF 50%.   Impression: 1. Single vessel CAD with patent mid LAD stent 2. Normal LV function  Recommendations: Continue medical management of CAD. I do not think his dyspnea is cardiac related.        Complications:  None. The patient tolerated the procedure well.

## 2013-06-14 NOTE — Progress Notes (Signed)
Pt resumed insulin pump at basal rate per Cecilie Kicks, PA.  Alisha in cath lab notified [facility] 137 @ [phone] and insulin pump resumed.

## 2013-06-14 NOTE — Progress Notes (Signed)
Was asked to see patient in short stay, prior to undergoing an outpatient LHC, for left lower extremity redness and swelling. He is endorsing slight discomfort/ irritation and discoloration on the dorsum of the left foot, proximal to his phalanges. This is along the area where his sandal strap wraps arround. He first noticed this last PM.   On exam, he has slight erythema, in a longitudinal band like manner across the dorsum of his food. Compared to the contralateral side, there is increased warmth, mild swelling and slight tenderness. There are no visible abrasions/ cuts/ scrapes. No rashes. He denies any recent trauma or injury to his foot. ? If this a a mild cellulitis. He is afebrile. CBC pending. He may require antibiotics. He states he just finished Augmentin 6 days ago for a sinus infection.   I will notify Dr. [name], but I don't think this will delay plans for [facility] today.

## 2013-06-22 ENCOUNTER — Encounter: Payer: Self-pay | Admitting: Gastroenterology

## 2013-06-28 ENCOUNTER — Other Ambulatory Visit: Payer: Self-pay | Admitting: Family Medicine

## 2013-07-03 ENCOUNTER — Encounter: Payer: Self-pay | Admitting: Cardiovascular Disease

## 2013-07-03 ENCOUNTER — Ambulatory Visit (INDEPENDENT_AMBULATORY_CARE_PROVIDER_SITE_OTHER): Payer: BC Managed Care – PPO | Admitting: Cardiovascular Disease

## 2013-07-03 VITALS — BP 126/86 | HR 68 | Ht 70.0 in | Wt 259.0 lb

## 2013-07-03 DIAGNOSIS — I10 Essential (primary) hypertension: Secondary | ICD-10-CM

## 2013-07-03 DIAGNOSIS — R0989 Other specified symptoms and signs involving the circulatory and respiratory systems: Secondary | ICD-10-CM

## 2013-07-03 DIAGNOSIS — R0609 Other forms of dyspnea: Secondary | ICD-10-CM

## 2013-07-03 DIAGNOSIS — R0600 Dyspnea, unspecified: Secondary | ICD-10-CM

## 2013-07-03 DIAGNOSIS — I2510 Atherosclerotic heart disease of native coronary artery without angina pectoris: Secondary | ICD-10-CM

## 2013-07-03 NOTE — Patient Instructions (Signed)
Your physician wants you to follow-up in:  12 months.  You will receive a reminder letter in the mail two months in advance. If you don't receive a letter, please call our office to schedule the follow-up appointment.   

## 2013-07-03 NOTE — Progress Notes (Signed)
History of Present Illness: 63 y.o. male with a history of CAD, DM, HTN, hyperlipidemia who is here today for cardiac follow up. He presented to the hospital on 05/14/10 with chest pain and ruled out for MI. Stress myoview was positive for anteroseptal ischemia and EF of 52%. Cardiac catheterization demonstrated single vessel CAD with a mid LAD stenosis of 90%. This was treated with a 3.0 x 16 mm Promus Element DES. He was placed on aspirin and Effient. I saw him for the first time in the office in June 2012. He described awareness of skipped beats at the initial visit. I had him wear a 48 hour Holter monitor. This showed rare PVCs and PACs. Admitted to [facility] May 2013 with chest pain. Cardiac cath May 2013 with stable CAD, patent LAD stent. He was seen in my office 05/19/13 and had c/o dyspnea with exertion, no chest pain. Lexiscan stress myoview 05/30/13 with LVEF=50%, possible inferior wall ischemia. Cardiac cath 06/14/13 with patent LAD stent and no disease in the small RCA or Circumflex.   He is here today for follow up.  No chest pain. He still has some dyspnea with exertion.   Primary care: Dr. Elease Hashimoto.   Last Lipid Profile:Lipid Panel     Component Value Date/Time   CHOL 113 10/02/2011 0900   TRIG 110.0 10/02/2011 0900   HDL 30.20* 10/02/2011 0900   CHOLHDL 4 10/02/2011 0900   VLDL 22.0 10/02/2011 0900   LDLCALC 61 10/02/2011 0900     Past Medical History  Diagnosis Date  . DIABETES MELLITUS, TYPE II 06/08/2008  . HYPERLIPIDEMIA 06/08/2008  . HYPERTENSION 06/08/2008  . ASTHMA 06/08/2008  . Impotence of organic origin 08/24/2008  . Chronic kidney disease   . Hemorrhoids   . Colonic polyp   . CAD (coronary artery disease)     a. s/p Promus DES (3.0 x 16 mm) to mid LAD 05/16/10;  b. Myoview 05/15/10: anteroseptal ischemia with EF 52%;   c. Cath 05/16/10: single vessel CAD with mLAD 90% tx with PCI and EF 50%;  d. 07/2011 Cath:  Patent stent, nonobs dzs, NL EF.  Marland Kitchen Myocardial infarction 04/2010  .  Pneumonia     hx of  . GERD (gastroesophageal reflux disease)     Past Surgical History  Procedure Laterality Date  . Coronary angioplasty with stent placement      Current Outpatient Prescriptions  Medication Sig Dispense Refill  . amoxicillin-clavulanate (AUGMENTIN) 875-125 MG per tablet Take 1 tablet by mouth 2 (two) times daily. 10 day course - started 3/18, ending 3/28      . Ascorbic Acid (VITAMIN C) 1000 MG tablet Take 1,000 mg by mouth 2 (two) times daily.       Marland Kitchen aspirin 81 MG tablet Take 1 tablet (81 mg total) by mouth daily.  30 tablet  0  . Biotin 1000 MCG tablet Take 1,000 mcg by mouth every evening.       . Cholecalciferol 1000 UNITS TBDP Take 1,000 Units by mouth daily.      . clopidogrel (PLAVIX) 75 MG tablet Take 75 mg by mouth daily.      . Dulaglutide (TRULICITY) 1.5 VV/6.1YW SOPN Inject 1.5 mg into the skin once a week. On Wednesdays      . glucagon (GLUCAGON EMERGENCY) 1 MG injection Inject 1 mg into the vein once as needed (Low blood sugar).       . Insulin Human (INSULIN PUMP) 100 unit/ml SOLN Inject into the  skin continuous. Humalog; basal rate: 50 - 85 units per day.      . lisinopril (PRINIVIL,ZESTRIL) 20 MG tablet Take 20 mg by mouth daily.      . metoprolol succinate (TOPROL-XL) 25 MG 24 hr tablet Take 1 tablet (25 mg total) by mouth daily.  90 tablet  0  . mometasone (NASONEX) 50 MCG/ACT nasal spray Place 1 spray into the nose 2 (two) times daily as needed (allergies).      . Multiple Vitamins-Minerals (ICAPS MV PO) Take 1 capsule by mouth every evening.      . nitroGLYCERIN (NITROSTAT) 0.4 MG SL tablet Place 0.4 mg under the tongue every 5 (five) minutes as needed for chest pain.       . Omega-3 Fatty Acids (FISH OIL) 1200 MG CAPS Take 1,200 mg by mouth daily.      . simvastatin (ZOCOR) 20 MG tablet Take 20 mg by mouth at bedtime.      . tadalafil (CIALIS) 20 MG tablet Take 20 mg by mouth every other day as needed for erectile dysfunction.      . vitamin E  400 UNIT capsule Take 400 Units by mouth every evening.      . Vitamins-Lipotropics (LIPO-FLAVONOID PLUS) TABS Take 1 tablet by mouth 2 (two) times daily.       No current facility-administered medications for this visit.    No Known Allergies  History   Social History  . Marital Status: Married    Spouse Name: N/A    Number of Children: N/A  . Years of Education: N/A   Occupational History  . Not on file.   Social History Main Topics  . Smoking status: Former Smoker -- 1.00 packs/day for 35 years    Types: Cigarettes    Quit date: 05/23/2002  . Smokeless tobacco: Never Used  . Alcohol Use: Yes     Comment: daily  . Drug Use: No  . Sexual Activity: Not Currently   Other Topics Concern  . Not on file   Social History Narrative  . No narrative on file    Family History  Problem Relation Age of Onset  . Diabetes Father   . Heart disease Father     Review of Systems:  As stated in the HPI and otherwise negative.   BP 126/86  Pulse 68  Ht 5\' 10"  (1.778 m)  Wt 259 lb (117.482 kg)  BMI 37.16 kg/m2  Physical Examination: General: Well developed, well nourished, NAD HEENT: OP clear, mucus membranes moist SKIN: warm, dry. No rashes. Neuro: No focal deficits Musculoskeletal: Muscle strength 5/5 all ext Psychiatric: Mood and affect normal Neck: No JVD, no carotid bruits, no thyromegaly, no lymphadenopathy. Lungs:Clear bilaterally, no wheezes, rhonci, crackles Cardiovascular: Regular rate and rhythm. No murmurs, gallops or rubs. Abdomen:Soft. Bowel sounds present. Non-tender.  Extremities: No lower extremity edema. Pulses are 2 + in the bilateral DP/PT.  Cardiac cath May 2013: Left main: No obstructive disease.  Left Anterior Descending Artery: Large vessel that courses to the apex. Patent stent mid vessel with no restenosis. Both diagonal branches are patent.  Circumflex Artery: Large caliber vessel with early intermediate branch followed by two moderate sized OM  branches. No obstructive disease noted.  Right Coronary Artery: Small non-dominant vessel with no obstructive disease noted.  Left Ventricular Angiogram: LVEF 60%.   Lexiscan stress myoview 05/30/13: Stress Procedure: The patient received IV Lexiscan 0.4 mg over 15-seconds. Technetium 48m Sestamibi injected at 30-seconds. This patient had chest  pain, sob, and was lt. Headed with the [employer]. Quantitative spect images were obtained after a 45 minute delay.  Stress ECG: No significant ST segment change suggestive of ischemia.  QPS  Raw Data Images: Acquisition technically good; LVE.  Stress Images: There is decreased uptake in the inferior wall.  Rest Images: There is decreased uptake in the inferior wall, less prominent compared to the stress images.  Subtraction (SDS): These findings are consistent with prior inferior infarct and mild peri-infarct ischemia.  Transient Ischemic Dilatation (Normal <1.22): 1.01  Lung/Heart Ratio (Normal <0.45): 0.37  Quantitative Gated Spect Images  QGS EDV: 122 ml  QGS ESV: 61 ml  Impression  Exercise Capacity: Lexiscan with no exercise.  BP Response: Normal blood pressure response.  Clinical Symptoms: There is chest pain and dyspnea.  ECG Impression: No significant ST segment change suggestive of ischemia.  Comparison with Prior Nuclear Study: Compared to 05/15/10, anterior defect absent and inferior infarct/ischemia new.  Overall Impression: Low risk stress nuclear study with a small, moderate intensity, partially reversible inferior defect consistent with prior inferior infarct and mild peri-infarct ischemia.  LV Ejection Fraction: 50%. LV Wall Motion: Mild inferior hypokinesis.  Cardiac cath 06/14/13: Left main: No obstructive disease.  Left Anterior Descending Artery: Moderate to large caliber vessel that courses to the apex. The proximal stent is patent with no restenosis. The mid and distal vessel has no obstructive disease. There are two small caliber  diagonal branches with no obstructive disease.  Circumflex Artery: Large caliber vessel with early intermediate branch followed by two moderate sized OM branches. No obstructive disease noted.  Right Coronary Artery: Small non-dominant vessel with no obstructive disease noted.  Left Ventricular Angiogram: LVEF 50%.  Impression:  1. Single vessel CAD with patent mid LAD stent  2. Normal LV function  Assessment and Plan:   1. CAD: Cath 06/14/13 with stable disease. No chest pain. Continue ASA, Plavix, statin, beta blocker.   2. HTN: BP controlled. No changes.   3. Hyperlipidemia: He is on a statin. Lipids well controlled. Lipids scanned in from [facility] 09/30/12 LDL 28, Total chol 128  4. Dyspnea: Likely multi-factorial with obesity, physical deconditioning. Does not appear to be cardiac related.

## 2013-07-25 ENCOUNTER — Other Ambulatory Visit: Payer: Self-pay

## 2013-07-25 MED ORDER — METOPROLOL SUCCINATE ER 25 MG PO TB24: 25.0000 mg | ORAL_TABLET | Freq: Every day | ORAL | Status: DC

## 2013-07-28 ENCOUNTER — Other Ambulatory Visit: Payer: Self-pay

## 2013-07-28 MED ORDER — METOPROLOL SUCCINATE ER 25 MG PO TB24: 25.0000 mg | ORAL_TABLET | Freq: Every day | ORAL | Status: DC

## 2013-07-31 LAB — HEMOGLOBIN A1C: HEMOGLOBIN A1C: 7.5 % — AB (ref 4.0–6.0)

## 2013-07-31 LAB — LIPID PANEL: LDL Cholesterol: 28 mg/dL

## 2013-10-03 ENCOUNTER — Encounter: Payer: Self-pay | Admitting: Family Medicine

## 2013-12-18 LAB — HM DIABETES EYE EXAM

## 2013-12-22 ENCOUNTER — Encounter: Payer: Self-pay | Admitting: Family Medicine

## 2013-12-29 ENCOUNTER — Other Ambulatory Visit: Payer: Self-pay

## 2014-01-15 ENCOUNTER — Encounter: Payer: Self-pay | Admitting: Gastroenterology

## 2014-01-29 LAB — HEMOGLOBIN A1C: HEMOGLOBIN A1C: 6.2 % — AB (ref 4.0–6.0)

## 2014-01-29 LAB — LIPID PANEL
Cholesterol: 131 mg/dL (ref 0–200)
Triglycerides: 115 mg/dL (ref 40–160)

## 2014-01-29 LAB — HEPATIC FUNCTION PANEL
ALT: 24 U/L (ref 10–40)
AST: 21 U/L (ref 14–40)

## 2014-01-29 LAB — BASIC METABOLIC PANEL
BUN: 22 mg/dL — AB (ref 4–21)
Creatinine: 0.8 mg/dL (ref 0.6–1.3)
GLUCOSE: 112 mg/dL
Potassium: 4.4 mmol/L (ref 3.4–5.3)
Sodium: 138 mmol/L (ref 137–147)

## 2014-01-31 ENCOUNTER — Encounter: Payer: Self-pay | Admitting: Family Medicine

## 2014-02-22 ENCOUNTER — Encounter (HOSPITAL_COMMUNITY): Payer: Self-pay | Admitting: Cardiovascular Disease

## 2014-02-23 ENCOUNTER — Encounter: Payer: Self-pay | Admitting: Family Medicine

## 2014-03-02 ENCOUNTER — Other Ambulatory Visit: Payer: Self-pay | Admitting: Family Medicine

## 2014-03-26 ENCOUNTER — Telehealth: Payer: Self-pay

## 2014-03-26 ENCOUNTER — Encounter: Payer: Self-pay | Admitting: Physician Assistant

## 2014-03-26 ENCOUNTER — Ambulatory Visit (INDEPENDENT_AMBULATORY_CARE_PROVIDER_SITE_OTHER): Payer: BLUE CROSS/BLUE SHIELD | Admitting: Physician Assistant

## 2014-03-26 VITALS — BP 146/80 | HR 68 | Ht 70.0 in | Wt 256.4 lb

## 2014-03-26 DIAGNOSIS — Z86010 Personal history of colonic polyps: Secondary | ICD-10-CM

## 2014-03-26 DIAGNOSIS — Z7901 Long term (current) use of anticoagulants: Secondary | ICD-10-CM

## 2014-03-26 MED ORDER — MOVIPREP 100 G PO SOLR: 1.0000 | Freq: Once | ORAL | Status: DC

## 2014-03-26 NOTE — Progress Notes (Signed)
Reviewed and agree with management plan.  Avraj Lindroth T. Ambrie Carte, MD FACG 

## 2014-03-26 NOTE — Progress Notes (Signed)
Patient ID: Joel Williams, male   DOB: 1950/05/17, 64 y.o.   MRN: [id number]    HPI:   [name] is a 64 year old male referred for evaluation by Dr. Elease Hashimoto due to a personal history of colon polyps.  [name] had a colonoscopy on 08/21/2008 at which time an adenomatous colon polyp was removed. He was advised to have surveillance in 5 years and presents to do so. He has a history of coronary artery disease, diabetes, hypertension, and hyperlipidemia. He presented to the hospital in February 2012 with chest pain and ruled out for an MI. Stress Myoview was positive for anteroseptal ischemia and ejection fraction of 52%. Cardiac catheterization showed single-vessel coronary artery disease with a mid LAD stenosis of 90% this was treated with a 3.0 x 16 mm Promus element DES. He was placed on aspirin and FEN's, but is currently on Plavix and aspirin. He had an admission to [facility] in May 2003 with chest pain. Cardiac cath in May 2013 had stable CAD and a patent LAD stent. In March 2015 he was seen with dyspnea on exertion but no chest pain. Lax a can stress Myoview on 05/30/2013 showed LVEF of 50%, possible inferior wall ischemia. Cardiac cath April 2015 showed patent LAD stent and no disease in the small RCA or circumflex.  He has had no change in his bowel habits or stool caliber. He has had no bloody or tarry stools.   Past Medical History  Diagnosis Date  . DIABETES MELLITUS, TYPE II 06/08/2008  . HYPERLIPIDEMIA 06/08/2008  . HYPERTENSION 06/08/2008  . ASTHMA 06/08/2008  . Impotence of organic origin 08/24/2008  . Chronic kidney disease   . Hemorrhoids   . Colonic polyp   . CAD (coronary artery disease)     a. s/p Promus DES (3.0 x 16 mm) to mid LAD 05/16/10;  b. Myoview 05/15/10: anteroseptal ischemia with EF 52%;   c. Cath 05/16/10: single vessel CAD with mLAD 90% tx with PCI and EF 50%;  d. 07/2011 Cath:  Patent stent, nonobs dzs, NL EF.  Marland Kitchen Myocardial infarction 04/2010  . Pneumonia     hx of  . GERD (gastroesophageal reflux disease)     Past Surgical History  Procedure Laterality Date  . Coronary angioplasty with stent placement    . Left heart catheterization with coronary angiogram N/A 07/29/2011    Procedure: LEFT HEART CATHETERIZATION WITH CORONARY ANGIOGRAM;  Surgeon: Burnell Blanks, MD;  Location: [facility] C[facility] LAB;  Service: Cardiovascular;  Laterality: N/A;  . Left heart catheterization with coronary angiogram N/A 06/14/2013    Procedure: LEFT HEART CATHETERIZATION WITH CORONARY ANGIOGRAM;  Surgeon: Burnell Blanks, MD;  Location: [facility] CATH LAB;  Service: Cardiovascular;  Laterality: N/A;   Family History  Problem Relation Age of Onset  . Diabetes Father   . Heart disease Father    History  Substance Use Topics  . Smoking status: Former Smoker -- 1.00 packs/day for 35 years    Types: Cigarettes    Quit date: 05/23/2002  . Smokeless tobacco: Never Used  . Alcohol Use: Yes     Comment: daily   Current Outpatient Prescriptions  Medication Sig Dispense Refill  . Ascorbic Acid (VITAMIN C) 1000 MG tablet Take 1,000 mg by mouth 2 (two) times daily.     Marland Kitchen aspirin 81 MG tablet Take 1 tablet (81 mg total) by mouth daily. 30 tablet 0  . Biotin 1000 MCG tablet Take 1,000 mcg by mouth every evening.     Marland Kitchen  Cholecalciferol 1000 UNITS TBDP Take 1,000 Units by mouth daily.    . clopidogrel (PLAVIX) 75 MG tablet Take 75 mg by mouth daily.    . Dulaglutide (TRULICITY) 1.5 AS/3.4HD SOPN Inject 1.5 mg into the skin once a week. On Wednesdays    . glucagon (GLUCAGON EMERGENCY) 1 MG injection Inject 1 mg into the vein once as needed (Low blood sugar).     Marland Kitchen HUMALOG 100 UNIT/ML injection     . Insulin Human (INSULIN PUMP) 100 unit/ml SOLN Inject into the skin continuous. Humalog; basal rate: 50 - 85 units per day.    . lisinopril (PRINIVIL,ZESTRIL) 20 MG tablet Take 20 mg by mouth daily.    . meloxicam (MOBIC) 15 MG tablet     . metFORMIN (GLUCOPHAGE) 500 MG tablet Take  1,000 mg by mouth 2 (two) times daily with a meal.    . metoprolol succinate (TOPROL-XL) 25 MG 24 hr tablet Take 1 tablet (25 mg total) by mouth daily. 90 tablet 3  . mometasone (NASONEX) 50 MCG/ACT nasal spray Place 1 spray into the nose 2 (two) times daily as needed (allergies).    . Multiple Vitamins-Minerals (ICAPS MV PO) Take 1 capsule by mouth every evening.    . nitroGLYCERIN (NITROSTAT) 0.4 MG SL tablet Place 0.4 mg under the tongue every 5 (five) minutes as needed for chest pain.     . Omega-3 Fatty Acids (FISH OIL) 1200 MG CAPS Take 1,200 mg by mouth daily.    . simvastatin (ZOCOR) 20 MG tablet TAKE 1 TABLET EVERY EVENING 90 tablet 0  . tadalafil (CIALIS) 20 MG tablet Take 20 mg by mouth every other day as needed for erectile dysfunction.    . vitamin E 400 UNIT capsule Take 400 Units by mouth every evening.    . Vitamins-Lipotropics (LIPO-FLAVONOID PLUS) TABS Take 1 tablet by mouth 2 (two) times daily.    Marland Kitchen MOVIPREP 100 G SOLR Take 1 kit (200 g total) by mouth once. 1 kit 0   No current facility-administered medications for this visit.   No Known Allergies   Review of Systems: Gen: Denies any fever, chills, sweats, anorexia, fatigue, weakness, malaise, weight loss, and sleep disorder CV: Denies chest pain, angina, palpitations, syncope, orthopnea, PND, peripheral edema, and claudication. Resp: Denies dyspnea at rest,  cough, sputum, wheezing, coughing up blood, and pleurisy.Has dyspneea with exertion. GI: Denies vomiting blood, jaundice, and fecal incontinence.   Denies dysphagia or odynophagia. GU : Denies urinary burning, blood in urine, urinary frequency, urinary hesitancy, nocturnal urination, and urinary incontinence. MS: Denies joint pain, limitation of movement, and swelling, stiffness, low back pain, extremity pain. Denies muscle weakness, cramps, atrophy.  Derm: Denies rash, itching, dry skin, hives, moles, warts, or unhealing ulcers.  Psych: Denies depression, anxiety,  memory loss, suicidal ideation, hallucinations, paranoia, and confusion. Heme: Denies bruising, bleeding, and enlarged lymph nodes. Neuro:  Denies any headaches, dizziness, paresthesias. Endo:  Denies any problems with DM, thyroid, adrenal function  Studies:  Cardiac cath May 2013: Left main: No obstructive disease.  Left Anterior Descending Artery: Large vessel that courses to the apex. Patent stent mid vessel with no restenosis. Both diagonal branches are patent.  Circumflex Artery: Large caliber vessel with early intermediate branch followed by two moderate sized OM branches. No obstructive disease noted.  Right Coronary Artery: Small non-dominant vessel with no obstructive disease noted.  Left Ventricular Angiogram: LVEF 60%.   Lexiscan stress myoview 05/30/13: Stress Procedure: The patient received IV Lexiscan 0.4  mg over 15-seconds. Technetium 45mSestamibi injected at 30-seconds. This patient had chest pain, sob, and was lt. Headed with the [employer] Quantitative spect images were obtained after a 45 minute delay.  Stress ECG: No significant ST segment change suggestive of ischemia.  QPS  Raw Data Images: Acquisition technically good; LVE.  Stress Images: There is decreased uptake in the inferior wall.  Rest Images: There is decreased uptake in the inferior wall, less prominent compared to the stress images.  Subtraction (SDS): These findings are consistent with prior inferior infarct and mild peri-infarct ischemia.  Transient Ischemic Dilatation (Normal <1.22): 1.01  Lung/Heart Ratio (Normal <0.45): 0.37  Quantitative Gated Spect Images  QGS EDV: 122 ml  QGS ESV: 61 ml  Impression  Exercise Capacity: Lexiscan with no exercise.  BP Response: Normal blood pressure response.  Clinical Symptoms: There is chest pain and dyspnea.  ECG Impression: No significant ST segment change suggestive of ischemia.  Comparison with Prior Nuclear Study: Compared to 05/15/10,  anterior defect absent and inferior infarct/ischemia new.  Overall Impression: Low risk stress nuclear study with a small, moderate intensity, partially reversible inferior defect consistent with prior inferior infarct and mild peri-infarct ischemia.  LV Ejection Fraction: 50%. LV Wall Motion: Mild inferior hypokinesis.  Cardiac cath 06/14/13: Left main: No obstructive disease.  Left Anterior Descending Artery: Moderate to large caliber vessel that courses to the apex. The proximal stent is patent with no restenosis. The mid and distal vessel has no obstructive disease. There are two small caliber diagonal branches with no obstructive disease.  Circumflex Artery: Large caliber vessel with early intermediate branch followed by two moderate sized OM branches. No obstructive disease noted.  Right Coronary Artery: Small non-dominant vessel with no obstructive disease noted.  Left Ventricular Angiogram: LVEF 50%.  Impression:  1. Single vessel CAD with patent mid LAD stent  2. Normal LV function   Prior Endoscopies:   Colonoscopy 08/21/2008: ENDOSCOPIC IMPRESSION:  1) 5 mm sessile polyp in the descending colon  2) 7 mm sessile polyp in the sigmoid colon  3) 3 - 4 mm, two polyps in the rectum  4) 5 mm sessile polyp in the rectum  5) Internal hemorrhoids  RECOMMENDATIONS:  1) No aspirin or NSAID's for 2 weeks  2) await pathology results  3) If 3 or more of the polyps removed today are proven to be adenomatous (pre-cancerous), you will need a colonoscopy in 3 years. If 2 or less are adenomatous a colonoscopy in 5 years. Otherwise you should continue to follow colorectal cancer screening guidelines for "routine risk" patients with a colonoscopy in 10 years.  REPORT OF SURGICAL PATHOLOGY  Case #: [id number] Patient Name: KMAYFIELD, SCHOENEOffice Chart Number: [id number] MRN: [id number]: JVonna KotykB. KLyndon Code MD DOB/Age [birth year](Age: [age] Gender: M Date  Taken: 08/21/2008 Date Received: 08/21/2008  FINAL DIAGNOSIS  MICROSCOPIC EXAMINATION AND DIAGNOSIS  COLON, DESCENDING, SIGMOID AND RECTUM, POLYPS: - TUBULAR ADENOMAS. - HYPERPLASTIC POLYPS. - HIGH GRADE DYSPLASIA IS NOT IDENTIFIED.   mw Date Reported: 08/23/2008 JVonna KotykB. KLyndon Code MD  Electronically Signed Out By JBK    Physical Exam: BP 146/80 mmHg  Pulse 68  Ht _0  (1.778 m)  Wt 256 lb 6.4 oz (116.302 kg)  BMI 36.79 kg/m2 Constitutional: Pleasant,well-developed male in no acute distress. HEENT: Normocephalic and atraumatic. Conjunctivae are normal. No scleral icterus. Neck supple. No thyromegaly Cardiovascular: Normal rate, regular rhythm.  Pulmonary/chest: Effort normal and breath sounds normal. No wheezing, rales or rhonchi. Abdominal:  Soft, nondistended, nontender. Bowel sounds active throughout. There are no masses palpable. No hepatomegaly. Extremities: no edema Lymphadenopathy: No cervical adenopathy noted. Neurological: Alert and oriented to person place and time. Skin: Skin is warm and dry. No rashes noted. Psychiatric: Normal mood and affect. Behavior is normal.  ASSESSMENT AND PLAN: 64 year old male with a personal history of colon polyps on chronic anticoagulation here to be scheduled for surveillance colonoscopy.The risks, benefits, and alternatives to colonoscopy with possible biopsy and possible polypectomy were discussed with the patient and they consent to proceed. The risk of holding anticoagulation therapy or antiplatelet medications was discussed including the increased risk for thromboembolic disease that may include DVT, pulmonary emboli and stroke. The patient understands this risk and is willing to proceed with temporally holding the medication provided that this is approved by her PCP or cardiologist. The procedure will be scheduled with Dr. Fuller Plan. Further recommendations will be made pending the findings of his  colonoscopy.    Dymond Spreen, Vita Barley PA-C 03/26/2014, 9:02 AM

## 2014-03-26 NOTE — Patient Instructions (Signed)

## 2014-03-26 NOTE — Telephone Encounter (Signed)
  Dear Dr. Buddy Duty:   Lucio Edward, MD has scheduled the above patient for a colonoscopy and endoscopy at 8:00am on 04/09/2014.  Our records show that he/she is on insulin therapy via an insulin pump.  Our colonoscopy prep protocol requires that:  the patient must be on a clear liquid diet the entire day prior to the procedure date as well as the morning of the procedure  the patient must be NPO for 2 hours prior to the procedure   the patient must consume a PEG 3350 solution to prepare for the procedure.  Please advise [place] of any adjustments that need to be made to the patient's insulin pump therapy prior to the above procedure date.    Please route or fax back this completed form to me at [phone] .  If you have any question, please call me at [phone].  Thank you for your help with this matter.  Sincerely,   Julieanne Cotton, CMA

## 2014-03-26 NOTE — Telephone Encounter (Signed)
  03/26/2014   RE: Joel Williams DOB: 01/18/1951 MRN: [id number]   Dear Dr. [name],    We have scheduled the above patient for an endoscopic procedure. Our records show that he is on anticoagulation therapy.   Please advise as to how long the patient may come off his therapy of Plavix prior to the procedure, which is scheduled for 04/09/2014.  Please fax back/ or route the completed form to [place] at [phone].   Sincerely,    Phillis Haggis

## 2014-03-27 ENCOUNTER — Encounter: Payer: Self-pay | Admitting: Cardiovascular Disease

## 2014-03-27 NOTE — Telephone Encounter (Signed)
Fraser Din, Can we fax my letter to this number? Thanks, chris

## 2014-03-28 NOTE — Telephone Encounter (Signed)
Letter faxed.

## 2014-03-30 NOTE — Telephone Encounter (Signed)
Lm with Dr. Cindra Eves assistant following up on pending insulin pump instructions.

## 2014-03-30 NOTE — Telephone Encounter (Signed)
Received approval from Dr. Sherrlyn Hock for patient to hold his Plavix for 7 days prior to his procedure.  Called patient and relayed this information.  Patient understood.  Will follow up with Dr. Buddy Duty regarding insulin pump and call patient with those instructions.

## 2014-04-02 NOTE — Telephone Encounter (Signed)
Spoke with Dr. Cindra Eves assistant to who told me she faxed the insulin pump instructions to me and also contacted the patient and explained them to him in detail.

## 2014-04-09 ENCOUNTER — Encounter: Payer: Self-pay | Admitting: Gastroenterology

## 2014-04-09 ENCOUNTER — Ambulatory Visit (AMBULATORY_SURGERY_CENTER): Payer: BLUE CROSS/BLUE SHIELD | Admitting: Gastroenterology

## 2014-04-09 VITALS — BP 137/73 | HR 62 | Temp 97.8°F | Resp 17 | Ht 70.0 in | Wt 256.0 lb

## 2014-04-09 DIAGNOSIS — D123 Benign neoplasm of transverse colon: Secondary | ICD-10-CM

## 2014-04-09 DIAGNOSIS — K621 Rectal polyp: Secondary | ICD-10-CM

## 2014-04-09 DIAGNOSIS — D129 Benign neoplasm of anus and anal canal: Secondary | ICD-10-CM

## 2014-04-09 DIAGNOSIS — D128 Benign neoplasm of rectum: Secondary | ICD-10-CM

## 2014-04-09 DIAGNOSIS — Z86010 Personal history of colonic polyps: Secondary | ICD-10-CM

## 2014-04-09 LAB — GLUCOSE, CAPILLARY
Glucose-Capillary: 144 mg/dL — ABNORMAL HIGH (ref 70–99)
Glucose-Capillary: 153 mg/dL — ABNORMAL HIGH (ref 70–99)

## 2014-04-09 MED ORDER — SODIUM CHLORIDE 0.9 % IV SOLN: 500.0000 mL | INTRAVENOUS | Status: DC

## 2014-04-09 NOTE — Progress Notes (Signed)
CRNA aware of patient's BP.

## 2014-04-09 NOTE — Patient Instructions (Signed)
Discharge instructions given. Handouts on polyps,diverticulosis and hemorrhoids. Resume previous medications. Resume Plavix tomorrow. YOU HAD AN ENDOSCOPIC PROCEDURE TODAY AT [facility]: Refer to the procedure report that was given to you for any specific questions about what was found during the examination.  If the procedure report does not answer your questions, please call your gastroenterologist to clarify.  If you requested that your care partner not be given the details of your procedure findings, then the procedure report has been included in a sealed envelope for you to review at your convenience later.  YOU SHOULD EXPECT: Some feelings of bloating in the abdomen. Passage of more gas than usual.  Walking can help get rid of the air that was put into your GI tract during the procedure and reduce the bloating. If you had a lower endoscopy (such as a colonoscopy or flexible sigmoidoscopy) you may notice spotting of blood in your stool or on the toilet paper. If you underwent a bowel prep for your procedure, then you may not have a normal bowel movement for a few days.  DIET: Your first meal following the procedure should be a light meal and then it is ok to progress to your normal diet.  A half-sandwich or bowl of soup is an example of a good first meal.  Heavy or fried foods are harder to digest and may make you feel nauseous or bloated.  Likewise meals heavy in dairy and vegetables can cause extra gas to form and this can also increase the bloating.  Drink plenty of fluids but you should avoid alcoholic beverages for 24 hours.  ACTIVITY: Your care partner should take you home directly after the procedure.  You should plan to take it easy, moving slowly for the rest of the day.  You can resume normal activity the day after the procedure however you should NOT DRIVE or use heavy machinery for 24 hours (because of the sedation medicines used during the test).    SYMPTOMS TO REPORT  IMMEDIATELY: A gastroenterologist can be reached at any hour.  During normal business hours, 8:30 AM to 5:00 PM Monday through Friday, call [phone].  After hours and on weekends, please call the GI answering service at [phone] who will take a message and have the physician on call contact you.   Following lower endoscopy (colonoscopy or flexible sigmoidoscopy):  Excessive amounts of blood in the stool  Significant tenderness or worsening of abdominal pains  Swelling of the abdomen that is new, acute  Fever of 100F or higher  FOLLOW UP: If any biopsies were taken you will be contacted by phone or by letter within the next 1-3 weeks.  Call your gastroenterologist if you have not heard about the biopsies in 3 weeks.  Our staff will call the home number listed on your records the next business day following your procedure to check on you and address any questions or concerns that you may have at that time regarding the information given to you following your procedure. This is a courtesy call and so if there is no answer at the home number and we have not heard from you through the emergency physician on call, we will assume that you have returned to your regular daily activities without incident.  SIGNATURES/CONFIDENTIALITY: You and/or your care partner have signed paperwork which will be entered into your electronic medical record.  These signatures attest to the fact that that the information above on your After Visit Summary  has been reviewed and is understood.  Full responsibility of the confidentiality of this discharge information lies with you and/or your care-partner. 

## 2014-04-09 NOTE — Op Note (Signed)
[facility]  [address] [place], [address]   COLONOSCOPY PROCEDURE REPORT  PATIENT: Joel Williams, Joel Williams  MR#: [id number] BIRTHDATE: 31-Aug-1950 , 1  yrs. old GENDER: male ENDOSCOPIST: Ladene Artist, MD, [facility] PROCEDURE DATE:  04/09/2014 PROCEDURE:   Colonoscopy with biopsy and Colonoscopy with snare polypectomy First Screening Colonoscopy - Avg.  risk and is 50 yrs.  old or older - No.  Prior Negative Screening - Now for repeat screening. N/A  History of Adenoma - Now for follow-up colonoscopy & has been > or = to 3 yrs.  Yes hx of adenoma.  Has been 3 or more years since last colonoscopy.  Polyps Removed Today? Yes. ASA CLASS:   Class III INDICATIONS:surveillance colonoscopy based on a history of adenomatous colonic polyp(s). MEDICATIONS: Monitored anesthesia care and Propofol 240 mg IV DESCRIPTION OF PROCEDURE:   After the risks benefits and alternatives of the procedure were thoroughly explained, informed consent was obtained.  The digital rectal exam revealed no abnormalities of the rectum.   The LB [id number]  endoscope was introduced through the anus and advanced to the cecum, which was identified by both the appendix and ileocecal valve. No adverse events experienced.   The quality of the prep was good, using MoviPrep  The instrument was then slowly withdrawn as the colon was fully examined.  COLON FINDINGS: A sessile polyp measuring 7 mm in size was found in the transverse colon.  A polypectomy was performed with a cold snare.  The resection was complete, the polyp tissue was completely retrieved and sent to histology.   Two sessile polyps measuring 4 mm in size were found in the rectum.  Polypectomies were performed with cold forceps.  The resection was complete, the polyp tissue was completely retrieved and sent to histology.   There was diverticulosis noted in the sigmoid colon.  Retroflexed views revealed internal Grade I hemorrhoids. The  time to cecum=1 minutes 56 seconds.  Withdrawal time=13 minutes 26 seconds.  The scope was withdrawn and the procedure completed. COMPLICATIONS: There were no immediate complications.  ENDOSCOPIC IMPRESSION: 1.   Sessile polyp in the transverse colon; polypectomy performed with a cold snare 2.   Two sessile polyps in the rectum; polypectomies performed with cold forceps 3.   Diverticulosis in the sigmoid colon 4.   Grade I internal hemorrhoids  RECOMMENDATIONS: 1.  Await pathology results 2.  High fiber diet with liberal fluid intake. 3.  Repeat Colonoscopy in 5 years. 4.  Resume Plavix tomorrow  eSigned:  Ladene Artist, MD, [facility] 04/09/2014 8:38 AM

## 2014-04-09 NOTE — Progress Notes (Signed)
Called to room to assist during endoscopic procedure.  Patient ID and intended procedure confirmed with present staff. Received instructions for my participation in the procedure from the performing physician.  

## 2014-04-09 NOTE — Progress Notes (Signed)
Report to PACU, RN, vss, BBS= Clear.  

## 2014-04-10 ENCOUNTER — Telehealth: Payer: Self-pay | Admitting: *Deleted

## 2014-04-10 NOTE — Telephone Encounter (Signed)
  Follow up Call-  Call back number 04/09/2014  Post procedure Call Back phone  # [phone]  Permission to leave phone message Yes     Patient questions:  Do you have a fever, pain , or abdominal swelling? No. Pain Score  0 *  Have you tolerated food without any problems? Yes.    Have you been able to return to your normal activities? Yes.    Do you have any questions about your discharge instructions: Diet   No. Medications  No. Follow up visit  No.  Do you have questions or concerns about your Care? No.  Actions: * If pain score is 4 or above: No action needed, pain <4.

## 2014-04-16 ENCOUNTER — Encounter: Payer: Self-pay | Admitting: Gastroenterology

## 2014-06-11 ENCOUNTER — Other Ambulatory Visit: Payer: Self-pay | Admitting: Cardiovascular Disease

## 2014-06-11 ENCOUNTER — Other Ambulatory Visit: Payer: Self-pay | Admitting: Family Medicine

## 2014-06-21 ENCOUNTER — Other Ambulatory Visit (INDEPENDENT_AMBULATORY_CARE_PROVIDER_SITE_OTHER): Payer: BLUE CROSS/BLUE SHIELD

## 2014-06-21 DIAGNOSIS — Z0000 Encounter for general adult medical examination without abnormal findings: Secondary | ICD-10-CM | POA: Diagnosis not present

## 2014-06-21 DIAGNOSIS — E785 Hyperlipidemia, unspecified: Secondary | ICD-10-CM | POA: Diagnosis not present

## 2014-06-21 LAB — MICROALBUMIN / CREATININE URINE RATIO
CREATININE, U: 76.8 mg/dL
Microalb Creat Ratio: 12.6 mg/g (ref 0.0–30.0)
Microalb, Ur: 9.7 mg/dL — ABNORMAL HIGH (ref 0.0–1.9)

## 2014-06-21 LAB — BASIC METABOLIC PANEL
BUN: 16 mg/dL (ref 6–23)
CALCIUM: 9.6 mg/dL (ref 8.4–10.5)
CHLORIDE: 102 meq/L (ref 96–112)
CO2: 29 meq/L (ref 19–32)
Creatinine, Ser: 0.67 mg/dL (ref 0.40–1.50)
GFR: 126.49 mL/min (ref >60.00)
Glucose, Bld: 176 mg/dL — ABNORMAL HIGH (ref 70–99)
Potassium: 4.7 mEq/L (ref 3.5–5.1)
SODIUM: 137 meq/L (ref 135–145)

## 2014-06-21 LAB — LDL CHOLESTEROL, DIRECT: Direct LDL: 73 mg/dL

## 2014-06-21 LAB — CBC WITH DIFFERENTIAL/PLATELET
BASOS PCT: 0.8 % (ref 0.0–3.0)
Basophils Absolute: 0 10*3/uL (ref 0.0–0.1)
EOS PCT: 2.4 % (ref 0.0–5.0)
Eosinophils Absolute: 0.1 10*3/uL (ref 0.0–0.7)
HCT: 43 % (ref 39.0–52.0)
Hemoglobin: 14.8 g/dL (ref 13.0–17.0)
LYMPHS PCT: 38.6 % (ref 12.0–46.0)
Lymphs Abs: 1.8 10*3/uL (ref 0.7–4.0)
MCHC: 34.5 g/dL (ref 30.0–36.0)
MCV: 85.9 fl (ref 78.0–100.0)
MONO ABS: 0.5 10*3/uL (ref 0.1–1.0)
Monocytes Relative: 10.7 % (ref 3.0–12.0)
Neutro Abs: 2.2 10*3/uL (ref 1.4–7.7)
Neutrophils Relative %: 47.5 % (ref 43.0–77.0)
Platelets: 161 10*3/uL (ref 150.0–400.0)
RBC: 5.01 Mil/uL (ref 4.22–5.81)
RDW: 14.1 % (ref 11.5–15.5)
WBC: 4.7 10*3/uL (ref 4.0–10.5)

## 2014-06-21 LAB — HEPATIC FUNCTION PANEL
ALK PHOS: 53 U/L (ref 39–117)
ALT: 47 U/L (ref 0–53)
AST: 33 U/L (ref 0–37)
Albumin: 4.2 g/dL (ref 3.5–5.2)
Bilirubin, Direct: 0.2 mg/dL (ref 0.0–0.3)
Total Bilirubin: 0.7 mg/dL (ref 0.2–1.2)
Total Protein: 6.9 g/dL (ref 6.0–8.3)

## 2014-06-21 LAB — LIPID PANEL
Cholesterol: 136 mg/dL (ref 0–200)
HDL: 36.2 mg/dL — ABNORMAL LOW (ref >39.00)
NonHDL: 99.8
Total CHOL/HDL Ratio: 4
Triglycerides: 249 mg/dL — ABNORMAL HIGH (ref 0.0–149.0)
VLDL: 49.8 mg/dL — ABNORMAL HIGH (ref 0.0–40.0)

## 2014-06-21 LAB — HEMOGLOBIN A1C: Hgb A1c MFr Bld: 7.9 % — ABNORMAL HIGH (ref 4.6–6.5)

## 2014-06-21 LAB — TSH: TSH: 0.83 u[IU]/mL (ref 0.35–4.50)

## 2014-06-21 LAB — PSA: PSA: 2.08 ng/mL (ref 0.10–4.00)

## 2014-06-25 ENCOUNTER — Ambulatory Visit (INDEPENDENT_AMBULATORY_CARE_PROVIDER_SITE_OTHER): Payer: BLUE CROSS/BLUE SHIELD | Admitting: Cardiovascular Disease

## 2014-06-25 ENCOUNTER — Encounter: Payer: Self-pay | Admitting: Cardiovascular Disease

## 2014-06-25 VITALS — BP 140/80 | HR 63 | Ht 67.0 in | Wt 262.2 lb

## 2014-06-25 DIAGNOSIS — I10 Essential (primary) hypertension: Secondary | ICD-10-CM | POA: Diagnosis not present

## 2014-06-25 DIAGNOSIS — I2510 Atherosclerotic heart disease of native coronary artery without angina pectoris: Secondary | ICD-10-CM

## 2014-06-25 DIAGNOSIS — E785 Hyperlipidemia, unspecified: Secondary | ICD-10-CM

## 2014-06-25 MED ORDER — METOPROLOL SUCCINATE ER 25 MG PO TB24: 25.0000 mg | ORAL_TABLET | Freq: Every day | ORAL | Status: DC

## 2014-06-25 MED ORDER — CLOPIDOGREL BISULFATE 75 MG PO TABS: 75.0000 mg | ORAL_TABLET | Freq: Every day | ORAL | Status: DC

## 2014-06-25 NOTE — Progress Notes (Signed)
Chief Complaint  Patient presents with  . Coronary Artery Disease    History of Present Illness: 64 y.o. male with a history of CAD, DM, HTN, hyperlipidemia who is here today for cardiac follow up. He presented to the hospital on 05/14/10 with chest pain and ruled out for MI. Stress myoview was positive for anteroseptal ischemia and EF of 52%. Cardiac catheterization demonstrated single vessel CAD with a mid LAD stenosis of 90%. This was treated with a 3.0 x 16 mm Promus Element DES. He was placed on aspirin and Effient. I saw him for the first time in the office in June 2012. He described awareness of skipped beats at the initial visit. I had him wear a 48 hour Holter monitor. This showed rare PVCs and PACs. Admitted to [facility] May 2013 with chest pain. Cardiac cath May 2013 with stable CAD, patent LAD stent. He was seen in my office 05/19/13 and had c/o dyspnea with exertion, no chest pain. Lexiscan stress myoview 05/30/13 with LVEF=50%, possible inferior wall ischemia. Cardiac cath 06/14/13 with patent LAD stent and no disease in the small RCA or Circumflex.   He is here today for follow up.  No chest pain. He still has some dyspnea with exertion. Feeling great overall.   Primary care: Dr. Elease Hashimoto.   Last Lipid Profile:Lipid Panel     Component Value Date/Time   CHOL 136 06/21/2014 0841   TRIG 249.0* 06/21/2014 0841   HDL 36.20* 06/21/2014 0841   CHOLHDL 4 06/21/2014 0841   VLDL 49.8* 06/21/2014 0841   LDLCALC 28 07/31/2013     Past Medical History  Diagnosis Date  . DIABETES MELLITUS, TYPE II 06/08/2008  . HYPERLIPIDEMIA 06/08/2008  . HYPERTENSION 06/08/2008  . ASTHMA 06/08/2008  . Impotence of organic origin 08/24/2008  . Chronic kidney disease   . Hemorrhoids   . Colonic polyp   . CAD (coronary artery disease)     a. s/p Promus DES (3.0 x 16 mm) to mid LAD 05/16/10;  b. Myoview 05/15/10: anteroseptal ischemia with EF 52%;   c. Cath 05/16/10: single vessel CAD with mLAD 90% tx with PCI and  EF 50%;  d. 07/2011 Cath:  Patent stent, nonobs dzs, NL EF.  Marland Kitchen Myocardial infarction 04/2010  . Pneumonia     hx of  . GERD (gastroesophageal reflux disease)     Past Surgical History  Procedure Laterality Date  . Coronary angioplasty with stent placement    . Left heart catheterization with coronary angiogram N/A 07/29/2011    Procedure: LEFT HEART CATHETERIZATION WITH CORONARY ANGIOGRAM;  Surgeon: Burnell Blanks, MD;  Location: [facility] CATH LAB;  Service: Cardiovascular;  Laterality: N/A;  . Left heart catheterization with coronary angiogram N/A 06/14/2013    Procedure: LEFT HEART CATHETERIZATION WITH CORONARY ANGIOGRAM;  Surgeon: Burnell Blanks, MD;  Location: [facility] CATH LAB;  Service: Cardiovascular;  Laterality: N/A;    Current Outpatient Prescriptions  Medication Sig Dispense Refill  . Ascorbic Acid (VITAMIN C) 1000 MG tablet Take 1,000 mg by mouth 2 (two) times daily.     Marland Kitchen aspirin 81 MG tablet Take 1 tablet (81 mg total) by mouth daily. 30 tablet 0  . Biotin 1000 MCG tablet Take 1,000 mcg by mouth every evening.     . calcium carbonate (OS-CAL) 600 MG TABS tablet Take 600 mg by mouth 2 (two) times daily with a meal.    . Cholecalciferol 1000 UNITS TBDP Take 1,000 Units by mouth daily.    Marland Kitchen  clopidogrel (PLAVIX) 75 MG tablet Take 75 mg by mouth daily.    . Dulaglutide (TRULICITY) 1.5 WV/3.7TG SOPN Inject 1.5 mg into the skin once a week. On Wednesdays    . glucagon (GLUCAGON EMERGENCY) 1 MG injection Inject 1 mg into the vein once as needed (Low blood sugar).     Marland Kitchen HUMALOG 100 UNIT/ML injection     . Insulin Human (INSULIN PUMP) 100 unit/ml SOLN Inject into the skin continuous. Humalog; basal rate: 50 - 85 units per day.    . lisinopril (PRINIVIL,ZESTRIL) 20 MG tablet Take 20 mg by mouth daily.    . meloxicam (MOBIC) 15 MG tablet     . metFORMIN (GLUCOPHAGE) 500 MG tablet Take 1,000 mg by mouth 2 (two) times daily with a meal.    . metoprolol succinate (TOPROL-XL) 25 MG 24 hr  tablet Take 1 tablet (25 mg total) by mouth daily. 90 tablet 3  . mometasone (NASONEX) 50 MCG/ACT nasal spray Place 1 spray into the nose 2 (two) times daily as needed (allergies).    . Multiple Vitamins-Minerals (ICAPS MV PO) Take 1 capsule by mouth every evening.    . nitroGLYCERIN (NITROSTAT) 0.4 MG SL tablet Place 0.4 mg under the tongue every 5 (five) minutes as needed for chest pain.     . Omega-3 Fatty Acids (FISH OIL) 1200 MG CAPS Take 1,200 mg by mouth daily.    . simvastatin (ZOCOR) 20 MG tablet TAKE 1 TABLET EVERY EVENING 90 tablet 0  . tadalafil (CIALIS) 20 MG tablet Take 20 mg by mouth every other day as needed for erectile dysfunction.    . vitamin E 400 UNIT capsule Take 400 Units by mouth every evening.    . Vitamins-Lipotropics (LIPO-FLAVONOID PLUS) TABS Take 1 tablet by mouth 2 (two) times daily.    . fluticasone (FLONASE) 50 MCG/ACT nasal spray Place 1 spray into both nostrils daily.  2   No current facility-administered medications for this visit.    No Known Allergies  History   Social History  . Marital Status: Married    Spouse Name: N/A  . Number of Children: 2  . Years of Education: N/A   Occupational History  . Sales    Social History Main Topics  . Smoking status: Former Smoker -- 1.00 packs/day for 35 years    Types: Cigarettes    Quit date: 05/23/2002  . Smokeless tobacco: Never Used  . Alcohol Use: 8.4 oz/week    14 Shots of liquor per week     Comment: daily  . Drug Use: No  . Sexual Activity: Not Currently   Other Topics Concern  . Not on file   Social History Narrative    Family History  Problem Relation Age of Onset  . Diabetes Father   . Heart disease Father   . Angina Father   . Dementia Mother     Review of Systems:  As stated in the HPI and otherwise negative.   BP 140/80 mmHg  Pulse 63  Ht 5\' 7"  (1.702 m)  Wt 262 lb 3.2 oz (118.933 kg)  BMI 41.06 kg/m2  Physical Examination: General: Well developed, well nourished,  NAD HEENT: OP clear, mucus membranes moist SKIN: warm, dry. No rashes. Neuro: No focal deficits Musculoskeletal: Muscle strength 5/5 all ext Psychiatric: Mood and affect normal Neck: No JVD, no carotid bruits, no thyromegaly, no lymphadenopathy. Lungs:Clear bilaterally, no wheezes, rhonci, crackles Cardiovascular: Regular rate and rhythm. No murmurs, gallops or rubs. Abdomen:Soft. Bowel  sounds present. Non-tender.  Extremities: No lower extremity edema. Pulses are 2 + in the bilateral DP/PT.  Lexiscan stress myoview 05/30/13: Stress Procedure: The patient received IV Lexiscan 0.4 mg over 15-seconds. Technetium 1m Sestamibi injected at 30-seconds. This patient had chest pain, sob, and was lt. Headed with the [employer]. Quantitative spect images were obtained after a 45 minute delay.  Stress ECG: No significant ST segment change suggestive of ischemia.  QPS  Raw Data Images: Acquisition technically good; LVE.  Stress Images: There is decreased uptake in the inferior wall.  Rest Images: There is decreased uptake in the inferior wall, less prominent compared to the stress images.  Subtraction (SDS): These findings are consistent with prior inferior infarct and mild peri-infarct ischemia.  Transient Ischemic Dilatation (Normal <1.22): 1.01  Lung/Heart Ratio (Normal <0.45): 0.37  Quantitative Gated Spect Images  QGS EDV: 122 ml  QGS ESV: 61 ml  Impression  Exercise Capacity: Lexiscan with no exercise.  BP Response: Normal blood pressure response.  Clinical Symptoms: There is chest pain and dyspnea.  ECG Impression: No significant ST segment change suggestive of ischemia.  Comparison with Prior Nuclear Study: Compared to 05/15/10, anterior defect absent and inferior infarct/ischemia new.  Overall Impression: Low risk stress nuclear study with a small, moderate intensity, partially reversible inferior defect consistent with prior inferior infarct and mild peri-infarct ischemia.  LV Ejection  Fraction: 50%. LV Wall Motion: Mild inferior hypokinesis.  Cardiac cath 06/14/13: Left main: No obstructive disease.  Left Anterior Descending Artery: Moderate to large caliber vessel that courses to the apex. The proximal stent is patent with no restenosis. The mid and distal vessel has no obstructive disease. There are two small caliber diagonal branches with no obstructive disease.  Circumflex Artery: Large caliber vessel with early intermediate branch followed by two moderate sized OM branches. No obstructive disease noted.  Right Coronary Artery: Small non-dominant vessel with no obstructive disease noted.  Left Ventricular Angiogram: LVEF 50%.  Impression:  1. Single vessel CAD with patent mid LAD stent  2. Normal LV function  EKG:  EKG is ordered today. The ekg ordered today demonstrates NSR, rate 63 bpm. RBBB  Recent Labs: 06/21/2014: ALT 47; BUN 16; Creatinine 0.67; Hemoglobin 14.8; Platelets 161.0; Potassium 4.7; Sodium 137; TSH 0.83   Lipid Panel    Component Value Date/Time   CHOL 136 06/21/2014 0841   TRIG 249.0* 06/21/2014 0841   HDL 36.20* 06/21/2014 0841   CHOLHDL 4 06/21/2014 0841   VLDL 49.8* 06/21/2014 0841   LDLCALC 28 07/31/2013   LDLDIRECT 73.0 06/21/2014 0841     Wt Readings from Last 3 Encounters:  06/25/14 262 lb 3.2 oz (118.933 kg)  04/09/14 256 lb (116.121 kg)  03/26/14 256 lb 6.4 oz (116.302 kg)     Other studies Reviewed: Additional studies/ records that were reviewed today include: . Review of the above records demonstrates:    Assessment and Plan:   1. CAD: Cath 06/14/13 with stable disease. No chest pain. Continue ASA, Plavix, statin, beta blocker.   2. HTN: BP controlled. No changes.   3. Hyperlipidemia: He is on a statin. Lipids well controlled.    Current medicines are reviewed at length with the patient today.  The patient does not have concerns regarding medicines.  The following changes have been made:  no change  Labs/ tests  ordered today include:  No orders of the defined types were placed in this encounter.    Disposition:   FU with me in  12 months.   Signed, Lauree Chandler, MD 06/25/2014 4:42 PM    [facility] [address], [place], [place]  [address] Phone: [phone]; Fax: [phone]

## 2014-06-25 NOTE — Patient Instructions (Signed)
Medication Instructions:  Your physician recommends that you continue on your current medications as directed. Please refer to the Current Medication list given to you today.   Labwork: none  Testing/Procedures: none  Follow-Up: Your physician wants you to follow-up in:  12 months.  You will receive a reminder letter in the mail two months in advance. If you don't receive a letter, please call our office to schedule the follow-up appointment.        

## 2014-06-29 ENCOUNTER — Ambulatory Visit (INDEPENDENT_AMBULATORY_CARE_PROVIDER_SITE_OTHER): Payer: BLUE CROSS/BLUE SHIELD | Admitting: Family Medicine

## 2014-06-29 ENCOUNTER — Encounter: Payer: Self-pay | Admitting: Family Medicine

## 2014-06-29 VITALS — BP 138/80 | HR 72 | Temp 97.8°F | Wt 261.0 lb

## 2014-06-29 DIAGNOSIS — Z0000 Encounter for general adult medical examination without abnormal findings: Secondary | ICD-10-CM | POA: Diagnosis not present

## 2014-06-29 DIAGNOSIS — Z23 Encounter for immunization: Secondary | ICD-10-CM

## 2014-06-29 NOTE — Progress Notes (Signed)
Pre visit review using our clinic review tool, if applicable. No additional management support is needed unless otherwise documented below in the visit note. 

## 2014-06-29 NOTE — Progress Notes (Signed)
Subjective:    Patient ID: Joel Williams, male    DOB: 09-19-1950, 64 y.o.   MRN: [id number]  HPI Patient seen for complete physical.  He has history of obesity, CAD, type 2 diabetes, hyperlipidemia, hypertension. He is followed by cardiology and endocrinology. He's had some recent weight gain. Poor compliance with exercise. No recent chest pains. Immunizations reviewed. Needs Prevnar 13. He had Pneumovax back in 2007. Will need tetanus booster in 1 year. Colonoscopy up-to-date.  Past Medical History  Diagnosis Date  . DIABETES MELLITUS, TYPE II 06/08/2008  . HYPERLIPIDEMIA 06/08/2008  . HYPERTENSION 06/08/2008  . ASTHMA 06/08/2008  . Impotence of organic origin 08/24/2008  . Chronic kidney disease   . Hemorrhoids   . Colonic polyp   . CAD (coronary artery disease)     a. s/p Promus DES (3.0 x 16 mm) to mid LAD 05/16/10;  b. Myoview 05/15/10: anteroseptal ischemia with EF 52%;   c. Cath 05/16/10: single vessel CAD with mLAD 90% tx with PCI and EF 50%;  d. 07/2011 Cath:  Patent stent, nonobs dzs, NL EF.  Marland Kitchen Myocardial infarction 04/2010  . Pneumonia     hx of  . GERD (gastroesophageal reflux disease)    Past Surgical History  Procedure Laterality Date  . Coronary angioplasty with stent placement    . Left heart catheterization with coronary angiogram N/A 07/29/2011    Procedure: LEFT HEART CATHETERIZATION WITH CORONARY ANGIOGRAM;  Surgeon: Burnell Blanks, MD;  Location: [facility] CATH LAB;  Service: Cardiovascular;  Laterality: N/A;  . Left heart catheterization with coronary angiogram N/A 06/14/2013    Procedure: LEFT HEART CATHETERIZATION WITH CORONARY ANGIOGRAM;  Surgeon: Burnell Blanks, MD;  Location: [facility] CATH LAB;  Service: Cardiovascular;  Laterality: N/A;    reports that he quit smoking about 12 years ago. His smoking use included Cigarettes. He has a 35 pack-year smoking history. He has never used smokeless tobacco. He reports that he drinks about 8.4 oz of alcohol per week. He  reports that he does not use illicit drugs. family history includes Angina in his father; Dementia in his mother; Diabetes in his father; Heart disease in his father. No Known Allergies    Review of Systems  Constitutional: Negative for fever, activity change, appetite change, fatigue and unexpected weight change.  HENT: Negative for congestion, ear pain and trouble swallowing.   Eyes: Negative for pain and visual disturbance.  Respiratory: Negative for cough, shortness of breath and wheezing.   Cardiovascular: Negative for chest pain and palpitations.  Gastrointestinal: Negative for nausea, vomiting, abdominal pain, diarrhea, constipation, blood in stool, abdominal distention and rectal pain.  Endocrine: Negative for polydipsia and polyuria.  Genitourinary: Negative for dysuria, hematuria and testicular pain.  Musculoskeletal: Negative for joint swelling and arthralgias.  Skin: Negative for rash.  Neurological: Negative for dizziness, syncope and headaches.  Hematological: Negative for adenopathy.  Psychiatric/Behavioral: Negative for confusion and dysphoric mood.       Objective:   Physical Exam  Constitutional: He is oriented to person, place, and time. He appears well-developed and well-nourished. No distress.  HENT:  Head: Normocephalic and atraumatic.  Right Ear: External ear normal.  Left Ear: External ear normal.  Mouth/Throat: Oropharynx is clear and moist.  Eyes: Conjunctivae and EOM are normal. Pupils are equal, round, and reactive to light.  Neck: Normal range of motion. Neck supple. No thyromegaly present.  Cardiovascular: Normal rate, regular rhythm and normal heart sounds.   No murmur heard. Pulmonary/Chest: No  respiratory distress. He has no wheezes. He has no rales.  Abdominal: Soft. Bowel sounds are normal. He exhibits no distension and no mass. There is no tenderness. There is no rebound and no guarding.  Musculoskeletal: He exhibits no edema.    Lymphadenopathy:    He has no cervical adenopathy.  Neurological: He is alert and oriented to person, place, and time. He displays normal reflexes. No cranial nerve deficit.  Skin: No rash noted.  Psychiatric: He has a normal mood and affect.          Assessment & Plan:  Complete physical. Labs reviewed. His A1c is up to 7.9%. Follow-up closely with endocrinology. Prevnar 13 given. Will need Pneumovax in 1 year. Also tetanus booster in 1 year. Strongly advised to lose weight.

## 2014-06-29 NOTE — Patient Instructions (Addendum)
We gave Prevnar 13 today and recommend Pneumovax next year Will also be due for tetanus next year.

## 2014-06-29 NOTE — Addendum Note (Signed)
Addended by: Noe Gens E on: 06/29/2014 10:05 AM   Modules accepted: Orders

## 2014-09-10 ENCOUNTER — Other Ambulatory Visit: Payer: Self-pay

## 2014-09-29 ENCOUNTER — Other Ambulatory Visit: Payer: Self-pay | Admitting: Family Medicine

## 2014-12-10 ENCOUNTER — Encounter: Payer: Self-pay | Admitting: Podiatry

## 2014-12-10 ENCOUNTER — Ambulatory Visit (INDEPENDENT_AMBULATORY_CARE_PROVIDER_SITE_OTHER): Payer: BLUE CROSS/BLUE SHIELD

## 2014-12-10 ENCOUNTER — Ambulatory Visit (INDEPENDENT_AMBULATORY_CARE_PROVIDER_SITE_OTHER): Payer: BLUE CROSS/BLUE SHIELD | Admitting: Podiatry

## 2014-12-10 VITALS — BP 134/66 | HR 74 | Resp 18

## 2014-12-10 DIAGNOSIS — M2580 Other specified joint disorders, unspecified joint: Secondary | ICD-10-CM | POA: Diagnosis not present

## 2014-12-10 DIAGNOSIS — R52 Pain, unspecified: Secondary | ICD-10-CM | POA: Diagnosis not present

## 2014-12-10 NOTE — Progress Notes (Signed)
   Subjective:    Patient ID: Joel Williams, male    DOB: 06-Apr-1950, 64 y.o.   MRN: [id number]  HPI  64 year old male presents the office today for diabetic risk assessment and for pain underling the big toe joint on the left foot. He states his been bothering him for approximately one month. He states the area was much more painful although it has decreased as he's been office his feet. He denies any history of injury or trauma. He is diabetic and states that his last A1c was 9.0. He gets some occasional burning pains as fevers denies any numbness. Denies any claudication symptoms. He said no recent treatment. No other complaints at this time.  Review of Systems  All other systems reviewed and are negative.      Objective:   Physical Exam  AAO 3, NAD DP/PT pulses palpable, CRT less than 3 seconds Protective sensation intact with Simms Weinstein monofilament, vibratory sensation intact, Achilles tendon reflex intact. There is mild hyperkeratotic lesions on the medial aspect of the bilateral hallux IPJ. There is mild edema along the plantar aspect of the left foot underlying the sesamoid complex. There is mild to palpation upon the medial sesamoid. There is no overlying erythema or increase in warmth. No other areas of tenderness to bilateral lower extremities. MMT 5/5, ROM WNL There are no open lesions or other pre-ulcer lesions identified this time. No pain with calf compression, swelling, warmth, erythema.     Assessment & Plan:   64 year old male uncontrolled diabetic with left sesamoiditis -Treatment options discussed including all alternatives, risks, and complications -X-rays were obtained and reviewed with the patient. On the AP view there may be some stress reaction within the sesamoid however is not visible the other x-rays. There is no history of injury or trauma. Fracture unlikely. -Offloading pads were dispensed. -Discussed supportive shoe gear. -Follow-up in 4 weeks  or sooner if any problems arise. In the meantime, encouraged to call the office with any questions, concerns, change in symptoms.   Celesta Gentile, DPM

## 2015-01-07 ENCOUNTER — Encounter: Payer: Self-pay | Admitting: Podiatry

## 2015-01-07 ENCOUNTER — Ambulatory Visit (INDEPENDENT_AMBULATORY_CARE_PROVIDER_SITE_OTHER): Payer: BLUE CROSS/BLUE SHIELD

## 2015-01-07 ENCOUNTER — Ambulatory Visit (INDEPENDENT_AMBULATORY_CARE_PROVIDER_SITE_OTHER): Payer: BLUE CROSS/BLUE SHIELD | Admitting: Podiatry

## 2015-01-07 DIAGNOSIS — M19072 Primary osteoarthritis, left ankle and foot: Secondary | ICD-10-CM

## 2015-01-07 DIAGNOSIS — M2580 Other specified joint disorders, unspecified joint: Secondary | ICD-10-CM

## 2015-01-07 DIAGNOSIS — M79672 Pain in left foot: Secondary | ICD-10-CM | POA: Diagnosis not present

## 2015-01-07 NOTE — Progress Notes (Signed)
Patient ID: Joel Williams, male   DOB: [birth year], 64 y.o.   MRN: [id number]  Subjective:  Patient presents the office for follow-up evaluation of left sesamoiditis and pain to the ball this first MTPJ on the left side. He states that he is doing well he has no complaints today. He believes that he is getting better. He denies any recent injury or trauma. Denies any swelling or redness. No tenderness. No other complaints at this time in no acute changes.   Objective:  AAO 3, NAD  DP/PT pulses 2/4, CRT less than 3 seconds  Protective sensation intact with Simms Weinstein monofilament  there is mild to palpation along both the medial and lateral sesamoids of the left foot. There is no pain to vibratory sensation. There is no purulent metatarsal of the hallux. There is no pain with MPJ range of motion. There is no abundant edema, erythema, increase in warmth. There is no other areas of tenderness to bilateral lower extremity's. Is no open lesions or pre-ulcerative lesions. No pain with calf compression, swelling, warmth or erythema.   Assessment:  64 year old male with left sesamoiditis, possible stress fracture although unlikely   Plan: -Treatment options discussed including all alternatives, risks, and complications -X-rays were obtained and reviewed with the patient.  -At this time discussed steroid injection however he wishes to hold off. -Continue with offloading pads which seems to help. I discussed with him orthotics. At this appointment he was scanned for orthotics. If they're covered we will proceed with this. Otherwise I'll mail him a prescription for biotech about diabetic inserts to see if they can be covered.  In the meantime call the office with any questions, concerns, change in symptoms.  Celesta Gentile, DPM

## 2015-01-22 ENCOUNTER — Telehealth: Payer: Self-pay | Admitting: *Deleted

## 2015-01-22 NOTE — Telephone Encounter (Addendum)
Pt left name and DOB with phone number. I left message asking pt to leave a brief message with his concerns and I would be able to answer without calling back and forth.  Pt states 2 weeks ago someone was to check his insurance for coverage of shoes and or insert, and he has not had any response.

## 2015-02-04 ENCOUNTER — Encounter: Payer: Self-pay | Admitting: Podiatry

## 2015-02-04 ENCOUNTER — Ambulatory Visit (INDEPENDENT_AMBULATORY_CARE_PROVIDER_SITE_OTHER): Payer: BLUE CROSS/BLUE SHIELD | Admitting: Podiatry

## 2015-02-04 VITALS — BP 159/79 | HR 68 | Resp 14 | Ht 70.0 in | Wt 255.0 lb

## 2015-02-04 DIAGNOSIS — M2580 Other specified joint disorders, unspecified joint: Secondary | ICD-10-CM | POA: Diagnosis not present

## 2015-02-04 DIAGNOSIS — M19072 Primary osteoarthritis, left ankle and foot: Secondary | ICD-10-CM | POA: Diagnosis not present

## 2015-02-04 MED ORDER — DICLOFENAC SODIUM 1 % TD GEL: 2.0000 g | Freq: Four times a day (QID) | TRANSDERMAL | Status: DC

## 2015-02-04 NOTE — Progress Notes (Signed)
Patient ID: Joel Williams, male   DOB: 07-24-1950, 64 y.o.   MRN: [id number]  Subjective: Mr. Mcgarey presents the office for follow-up evaluation of left sesamoiditis and pain to the ball this first MTPJ on the left side. He states that the adhesive on the pads was irritating his skin and he is asking for new ones as they did help, but to put in his shoes. Since he has continued with pain, he would like to proceed with orthotics. No recent injury or trauma. Denies any swelling or redness. No tenderness. No other complaints at this time in no acute changes.  Objective: AAO 3, NAD DP/PT pulses 2/4, CRT less than 3 seconds Protective sensation intact with Simms Weinstein monofilament There is mild to palpation along both the medial and lateral sesamoids of the left foot but it appears to have improved. There is no pain to vibratory sensation. There is pain along the metatarsal or the hallux. There is no pain with MPJ range of motion. There is no significant edema, erythema, increase in warmth. There is no other areas of tenderness to bilateral lower extremity's. Is no open lesions or pre-ulcerative lesions. No pain with calf compression, swelling, warmth or erythema.   Assessment:  64 year old male with left sesamoiditis, possible stress fracture although unlikely   Plan: -Treatment options discussed including all alternatives, risks, and complications -At this time discussed steroid injection however he wishes to hold off. -Prescribed voltaren gel -He would like to proceed with orthotics at this time. He was scanned today and sent to [facility] labs.  -Follow-up in 3 weeks to [facility] or sooner if any problems arise. In the meantime, encouraged to call the office with any questions, concerns, change in symptoms.   Celesta Gentile, DPM

## 2015-02-11 ENCOUNTER — Telehealth: Payer: Self-pay | Admitting: *Deleted

## 2015-02-11 NOTE — Telephone Encounter (Signed)
PRIOR AUTHORIZATION received for Voltaren 1% Gel, CASE# [id number], VALID 01/12/2015 - 02/11/2016, KEY [id number].

## 2015-02-25 ENCOUNTER — Encounter: Payer: Self-pay | Admitting: Podiatry

## 2015-02-25 ENCOUNTER — Ambulatory Visit (INDEPENDENT_AMBULATORY_CARE_PROVIDER_SITE_OTHER): Payer: BLUE CROSS/BLUE SHIELD | Admitting: Podiatry

## 2015-02-25 ENCOUNTER — Ambulatory Visit (INDEPENDENT_AMBULATORY_CARE_PROVIDER_SITE_OTHER): Payer: BLUE CROSS/BLUE SHIELD

## 2015-02-25 VITALS — BP 143/75 | HR 67 | Resp 18

## 2015-02-25 DIAGNOSIS — R52 Pain, unspecified: Secondary | ICD-10-CM | POA: Diagnosis not present

## 2015-02-25 DIAGNOSIS — M19072 Primary osteoarthritis, left ankle and foot: Secondary | ICD-10-CM | POA: Diagnosis not present

## 2015-02-25 DIAGNOSIS — M2580 Other specified joint disorders, unspecified joint: Secondary | ICD-10-CM | POA: Insufficient documentation

## 2015-02-25 NOTE — Progress Notes (Signed)
Patient ID: Joel Williams, male   DOB: 04-04-1950, 64 y.o.   MRN: [id number]  Subjective: Joel Williams presents the office for follow-up evaluation of left sesamoiditis and pain to the ball this first MTPJ on the left side. He presents for orthotic pickup. He states that the pain in the left side is improving. Denies any swelling or redness. No recent injury or trauma. Denies any swelling or redness. No tenderness. No other complaints at this time in no acute changes.  Objective: AAO 3, NAD DP/PT pulses 2/4, CRT less than 3 seconds Protective sensation intact with Simms Weinstein monofilament There is mild to palpation along both the medial and lateral sesamoids of the left foot but it appears to have continued to improve. There is no pain to vibratory sensation. There is pain along the metatarsal or the hallux. There is no pain with MPJ range of motion. There is no significant edema, erythema, increase in warmth. There is no other areas of tenderness to bilateral lower extremity's. Is no open lesions or pre-ulcerative lesions. No pain with calf compression, swelling, warmth or erythema.   Assessment:  64 year old male with left sesamoiditis, likely arthritic changes the sesamoids.   Plan: -Treatment options discussed including all alternatives, risks, and complications -As his appointment orthotics were dispensed and fitted to issues. Break-in instructions were discussed. Again discussed steroid injection be wishes to hold off. -Follow-up in 6 weeks or sooner if any problems arise. In the meantime, encouraged to call the office with any questions, concerns, change in symptoms.   Celesta Gentile, DPM

## 2015-02-25 NOTE — Patient Instructions (Signed)

## 2015-03-25 LAB — HM DIABETES EYE EXAM

## 2015-03-29 ENCOUNTER — Encounter: Payer: Self-pay | Admitting: Family Medicine

## 2015-04-08 ENCOUNTER — Ambulatory Visit: Payer: BLUE CROSS/BLUE SHIELD | Admitting: Podiatry

## 2015-06-12 LAB — PSA: PSA: 2.15

## 2015-06-12 LAB — CBC AND DIFFERENTIAL
HCT: 44 % (ref 41–53)
HEMOGLOBIN: 14.7 g/dL (ref 13.5–17.5)
Platelets: 158 10*3/uL (ref 150–399)
WBC: 5.8 10*3/mL

## 2015-06-12 LAB — HEPATIC FUNCTION PANEL
ALT: 52 U/L — AB (ref 10–40)
AST: 37 U/L (ref 14–40)

## 2015-06-12 LAB — BASIC METABOLIC PANEL
BUN: 18 mg/dL (ref 4–21)
CREATININE: 0.7 mg/dL (ref 0.6–1.3)
GLUCOSE: 228 mg/dL
POTASSIUM: 4 mmol/L (ref 3.4–5.3)
SODIUM: 139 mmol/L (ref 137–147)

## 2015-06-12 LAB — LIPID PANEL
Cholesterol: 140 mg/dL (ref 0–200)
HDL: 38 mg/dL (ref 35–70)
LDL Cholesterol: 51 mg/dL
Triglycerides: 250 mg/dL — AB (ref 40–160)

## 2015-06-12 LAB — HEMOGLOBIN A1C: Hemoglobin A1C: 8.1

## 2015-06-12 LAB — TSH: TSH: 1.16 u[IU]/mL (ref 0.41–5.90)

## 2015-06-13 ENCOUNTER — Other Ambulatory Visit: Payer: Self-pay | Admitting: Family Medicine

## 2015-06-24 ENCOUNTER — Encounter: Payer: Self-pay | Admitting: Family Medicine

## 2015-06-24 NOTE — Telephone Encounter (Signed)
Pt's appt is at 10am. Do you want pt to come fasting or have labs prior?

## 2015-06-27 ENCOUNTER — Other Ambulatory Visit: Payer: Self-pay | Admitting: *Deleted

## 2015-06-27 ENCOUNTER — Encounter: Payer: Self-pay | Admitting: Cardiovascular Disease

## 2015-06-27 DIAGNOSIS — I2510 Atherosclerotic heart disease of native coronary artery without angina pectoris: Secondary | ICD-10-CM

## 2015-06-27 MED ORDER — METOPROLOL SUCCINATE ER 25 MG PO TB24
25.0000 mg | ORAL_TABLET | Freq: Every day | ORAL | Status: DC
Start: 2015-06-27 — End: 2015-12-19

## 2015-06-27 MED ORDER — CLOPIDOGREL BISULFATE 75 MG PO TABS: 75.0000 mg | ORAL_TABLET | Freq: Every day | ORAL | Status: DC

## 2015-06-27 MED ORDER — METOPROLOL SUCCINATE ER 25 MG PO TB24: 25.0000 mg | ORAL_TABLET | Freq: Every day | ORAL | Status: DC

## 2015-07-05 ENCOUNTER — Encounter: Payer: Self-pay | Admitting: Family Medicine

## 2015-07-05 ENCOUNTER — Ambulatory Visit (INDEPENDENT_AMBULATORY_CARE_PROVIDER_SITE_OTHER): Payer: BLUE CROSS/BLUE SHIELD | Admitting: Family Medicine

## 2015-07-05 VITALS — BP 170/98 | HR 70 | Temp 98.0°F | Ht 70.0 in | Wt 253.1 lb

## 2015-07-05 DIAGNOSIS — Z0000 Encounter for general adult medical examination without abnormal findings: Secondary | ICD-10-CM

## 2015-07-05 DIAGNOSIS — Z23 Encounter for immunization: Secondary | ICD-10-CM

## 2015-07-05 MED ORDER — SIMVASTATIN 20 MG PO TABS: 20.0000 mg | ORAL_TABLET | Freq: Every day | ORAL | Status: DC

## 2015-07-05 NOTE — Progress Notes (Signed)
Subjective:    Patient ID: Joel Williams, male    DOB: 1950-08-28, 65 y.o.   MRN: [id number]  HPI  Patient seen for physical exam. He has history of hypertension, obesity, type 2 diabetes (on insulin pump), dyslipidemia. Followed by endocrinology.   Also has history of CAD. Quit smoking several years ago. No recent chest pains.  Immunizations reviewed. He needs tetanus booster as well as Pneumovax.  No consistent exercise. Blood sugars been ranging slightly high recently with recent A1c 8.1%.   Blood pressure generally well-controlled by home readings. Colonoscopy up-to-date. He's had previous shingles vaccine. He gets regular eye checkups.  Past Medical History  Diagnosis Date  . DIABETES MELLITUS, TYPE II 06/08/2008  . HYPERLIPIDEMIA 06/08/2008  . HYPERTENSION 06/08/2008  . ASTHMA 06/08/2008  . Impotence of organic origin 08/24/2008  . Chronic kidney disease   . Hemorrhoids   . Colonic polyp   . CAD (coronary artery disease)     a. s/p Promus DES (3.0 x 16 mm) to mid LAD 05/16/10;  b. Myoview 05/15/10: anteroseptal ischemia with EF 52%;   c. Cath 05/16/10: single vessel CAD with mLAD 90% tx with PCI and EF 50%;  d. 07/2011 Cath:  Patent stent, nonobs dzs, NL EF.  Marland Kitchen Myocardial infarction ([place]) 04/2010  . Pneumonia     hx of  . GERD (gastroesophageal reflux disease)    Past Surgical History  Procedure Laterality Date  . Coronary angioplasty with stent placement    . Left heart catheterization with coronary angiogram N/A 07/29/2011    Procedure: LEFT HEART CATHETERIZATION WITH CORONARY ANGIOGRAM;  Surgeon: Burnell Blanks, MD;  Location: [facility] CATH LAB;  Service: Cardiovascular;  Laterality: N/A;  . Left heart catheterization with coronary angiogram N/A 06/14/2013    Procedure: LEFT HEART CATHETERIZATION WITH CORONARY ANGIOGRAM;  Surgeon: Burnell Blanks, MD;  Location: [facility] CATH LAB;  Service: Cardiovascular;  Laterality: N/A;    reports that he quit smoking about 13 years ago.  His smoking use included Cigarettes. He has a 35 pack-year smoking history. He has never used smokeless tobacco. He reports that he drinks about 8.4 oz of alcohol per week. He reports that he does not use illicit drugs. family history includes Angina in his father; Dementia in his mother; Diabetes in his father; Heart disease in his father. No Known Allergies    Review of Systems  Constitutional: Negative for fever, activity change, appetite change and fatigue.  HENT: Negative for congestion, ear pain and trouble swallowing.   Eyes: Negative for pain and visual disturbance.  Respiratory: Negative for cough, chest tightness, shortness of breath and wheezing.   Cardiovascular: Negative for chest pain, palpitations and leg swelling.  Gastrointestinal: Negative for nausea, vomiting, abdominal pain, diarrhea, constipation, blood in stool, abdominal distention and rectal pain.  Endocrine: Negative for polydipsia and polyuria.  Genitourinary: Negative for dysuria, hematuria and testicular pain.  Musculoskeletal: Negative for joint swelling and arthralgias.  Skin: Negative for rash.  Neurological: Negative for dizziness, syncope, weakness, light-headedness and headaches.  Hematological: Negative for adenopathy.  Psychiatric/Behavioral: Negative for confusion and dysphoric mood.       Objective:   Physical Exam  Constitutional: He is oriented to person, place, and time. He appears well-developed and well-nourished. No distress.  HENT:  Head: Normocephalic and atraumatic.  Right Ear: External ear normal.  Left Ear: External ear normal.  Mouth/Throat: Oropharynx is clear and moist.  Eyes: Conjunctivae and EOM are normal. Pupils are equal, round, and  reactive to light.  Neck: Normal range of motion. Neck supple. No thyromegaly present.  Cardiovascular: Normal rate, regular rhythm and normal heart sounds.   No murmur heard. Pulmonary/Chest: No respiratory distress. He has no wheezes. He has no  rales.  Abdominal: Soft. Bowel sounds are normal. He exhibits no distension and no mass. There is no tenderness. There is no rebound and no guarding.  Musculoskeletal: He exhibits no edema.  Lymphadenopathy:    He has no cervical adenopathy.  Neurological: He is alert and oriented to person, place, and time. He displays normal reflexes. No cranial nerve deficit.  Skin: No rash noted.  Couple small bruises on forearms. No concerning lesions noted.  Psychiatric: He has a normal mood and affect.          Assessment & Plan:   Physical exam. Recent labs reviewed with patient. Minimally elevated ALT. Suggest repeat hepatic panel in 3 months. Most likely related to fatty liver changes. Tetanus booster and Pneumovax given. Continue yearly flu vaccine. Strongly encouraged to lose some weight. Blood pressure up today but generally has been well controlled. He will monitor closely over the next several weeks and be in touch if consistently greater than 140/90.  [name] MD  [facility]

## 2015-07-05 NOTE — Progress Notes (Signed)
Pre visit review using our clinic review tool, if applicable. No additional management support is needed unless otherwise documented below in the visit note. 

## 2015-07-05 NOTE — Patient Instructions (Signed)
Monitor blood pressure and be in touch if consistently > 140/90.   

## 2015-09-06 ENCOUNTER — Encounter: Payer: Self-pay | Admitting: Cardiovascular Disease

## 2015-10-07 ENCOUNTER — Ambulatory Visit: Payer: BLUE CROSS/BLUE SHIELD | Admitting: Cardiovascular Disease

## 2015-12-09 ENCOUNTER — Ambulatory Visit (INDEPENDENT_AMBULATORY_CARE_PROVIDER_SITE_OTHER): Payer: BLUE CROSS/BLUE SHIELD | Admitting: Cardiovascular Disease

## 2015-12-09 ENCOUNTER — Encounter: Payer: Self-pay | Admitting: Cardiovascular Disease

## 2015-12-09 VITALS — BP 150/84 | HR 74 | Ht 70.0 in | Wt 256.4 lb

## 2015-12-09 DIAGNOSIS — I2510 Atherosclerotic heart disease of native coronary artery without angina pectoris: Secondary | ICD-10-CM | POA: Diagnosis not present

## 2015-12-09 DIAGNOSIS — I10 Essential (primary) hypertension: Secondary | ICD-10-CM | POA: Diagnosis not present

## 2015-12-09 DIAGNOSIS — E785 Hyperlipidemia, unspecified: Secondary | ICD-10-CM

## 2015-12-09 NOTE — Patient Instructions (Addendum)
Medication Instructions:  Your physician recommends that you continue on your current medications as directed. Please refer to the Current Medication list given to you today.   Labwork: none  Testing/Procedures: none  Follow-Up: Your physician recommends that you schedule a follow-up appointment in: 3-4 weeks.  --Scheduled for October 23,2017 at 10:30   Any Other Special Instructions Will Be Listed Below (If Applicable).     If you need a refill on your cardiac medications before your next appointment, please call your pharmacy.

## 2015-12-09 NOTE — Progress Notes (Signed)
Chief Complaint  Patient presents with  . Follow-up    History of Present Illness: 65 y.o. male with a history of CAD, DM, HTN, hyperlipidemia who is here today for cardiac follow up. He presented to the hospital on 05/14/10 with chest pain and ruled out for MI. Stress myoview was positive for anteroseptal ischemia and EF of 52%. Cardiac catheterization demonstrated single vessel CAD with a mid LAD stenosis of 90%. This was treated with a 3.0 x 16 mm Promus Element DES. 48 hour Holter monitor 2012 with sinus, rare PVCs and PACs. Admitted to [facility] May 2013 with chest pain. Cardiac cath May 2013 with stable CAD, patent LAD stent. He was seen in my office 05/19/13 and had c/o dyspnea with exertion, no chest pain. Lexiscan stress myoview 05/30/13 with LVEF=50%, possible inferior wall ischemia. Cardiac cath 06/14/13 with patent LAD stent and no disease in the small RCA or Circumflex.   He is here today for follow up.  No chest pain. He has noticed some dyspnea. This is when walking up an incline. No cough, fever or chills. No LE edema. Weight is stable. He did have left arm pain along with dyspnea prior to his stent placement in 2012. His energy level has been normal. He does not exercise regularly.   Primary care: [name], MD  Past Medical History:  Diagnosis Date  . ASTHMA 06/08/2008  . CAD (coronary artery disease)    a. s/p Promus DES (3.0 x 16 mm) to mid LAD 05/16/10;  b. Myoview 05/15/10: anteroseptal ischemia with EF 52%;   c. Cath 05/16/10: single vessel CAD with mLAD 90% tx with PCI and EF 50%;  d. 07/2011 Cath:  Patent stent, nonobs dzs, NL EF.  Marland Kitchen Chronic kidney disease   . Colonic polyp   . DIABETES MELLITUS, TYPE II 06/08/2008  . GERD (gastroesophageal reflux disease)   . Hemorrhoids   . HYPERLIPIDEMIA 06/08/2008  . HYPERTENSION 06/08/2008  . Impotence of organic origin 08/24/2008  . Myocardial infarction ([place]) 04/2010  . Pneumonia    hx of    Past Surgical History:  Procedure  Laterality Date  . CORONARY ANGIOPLASTY WITH STENT PLACEMENT    . LEFT HEART CATHETERIZATION WITH CORONARY ANGIOGRAM N/A 07/29/2011   Procedure: LEFT HEART CATHETERIZATION WITH CORONARY ANGIOGRAM;  Surgeon: Burnell Blanks, MD;  Location: [facility] CATH LAB;  Service: Cardiovascular;  Laterality: N/A;  . LEFT HEART CATHETERIZATION WITH CORONARY ANGIOGRAM N/A 06/14/2013   Procedure: LEFT HEART CATHETERIZATION WITH CORONARY ANGIOGRAM;  Surgeon: Burnell Blanks, MD;  Location: [facility] CATH LAB;  Service: Cardiovascular;  Laterality: N/A;    Current Outpatient Prescriptions  Medication Sig Dispense Refill  . Ascorbic Acid (VITAMIN C) 1000 MG tablet Take 1,000 mg by mouth 2 (two) times daily.     Marland Kitchen aspirin 81 MG tablet Take 1 tablet (81 mg total) by mouth daily. 30 tablet 0  . Biotin 1000 MCG tablet Take 1,000 mcg by mouth every evening.     . calcium carbonate (OS-CAL) 600 MG TABS tablet Take 600 mg by mouth 2 (two) times daily with a meal.    . Cholecalciferol 1000 UNITS TBDP Take 1,000 Units by mouth daily.    . clopidogrel (PLAVIX) 75 MG tablet Take 1 tablet (75 mg total) by mouth daily. 90 tablet 3  . diclofenac sodium (VOLTAREN) 1 % GEL Apply 2 g topically 4 (four) times daily. Rub into affected area of foot 2 to 4 times daily 100 g 2  .  fluticasone (FLONASE) 50 MCG/ACT nasal spray Place 1 spray into both nostrils daily.  2  . glucagon (GLUCAGON EMERGENCY) 1 MG injection Inject 1 mg into the vein once as needed (Low blood sugar).     Marland Kitchen HUMALOG 100 UNIT/ML injection     . Insulin Human (INSULIN PUMP) 100 unit/ml SOLN Inject into the skin continuous. Humalog; basal rate: 50 - 85 units per day.    . lisinopril (PRINIVIL,ZESTRIL) 20 MG tablet Take 20 mg by mouth daily.    . meloxicam (MOBIC) 15 MG tablet     . metFORMIN (GLUCOPHAGE) 500 MG tablet Take 1,000 mg by mouth 2 (two) times daily with a meal.    . metoprolol succinate (TOPROL-XL) 25 MG 24 hr tablet Take 1 tablet (25 mg total) by mouth  daily. 90 tablet 3  . mometasone (NASONEX) 50 MCG/ACT nasal spray Place 1 spray into the nose 2 (two) times daily as needed (allergies).    . Multiple Vitamins-Minerals (ICAPS MV PO) Take 1 capsule by mouth every evening.    . nitroGLYCERIN (NITROSTAT) 0.4 MG SL tablet Place 0.4 mg under the tongue every 5 (five) minutes as needed for chest pain.     . Omega-3 Fatty Acids (FISH OIL) 1200 MG CAPS Take 1,200 mg by mouth daily.    . simvastatin (ZOCOR) 20 MG tablet Take 1 tablet (20 mg total) by mouth daily at 6 PM. 90 tablet 2  . tadalafil (CIALIS) 20 MG tablet Take 20 mg by mouth every other day as needed for erectile dysfunction.    . vitamin E 400 UNIT capsule Take 400 Units by mouth every evening.    . Vitamins-Lipotropics (LIPO-FLAVONOID PLUS) TABS Take 1 tablet by mouth 2 (two) times daily.     No current facility-administered medications for this visit.     No Known Allergies  Social History   Social History  . Marital status: Married    Spouse name: N/A  . Number of children: 2  . Years of education: N/A   Occupational History  . Sales    Social History Main Topics  . Smoking status: Former Smoker    Packs/day: 1.00    Years: 35.00    Types: Cigarettes    Quit date: 05/23/2002  . Smokeless tobacco: Never Used  . Alcohol use 8.4 oz/week    14 Shots of liquor per week     Comment: daily  . Drug use: No  . Sexual activity: Not Currently   Other Topics Concern  . Not on file   Social History Narrative  . No narrative on file    Family History  Problem Relation Age of Onset  . Diabetes Father   . Heart disease Father   . Angina Father   . Dementia Mother     Review of Systems:  As stated in the HPI and otherwise negative.   BP (!) 150/84 (BP Location: Left Arm, Patient Position: Sitting, Cuff Size: Large)   Pulse 74   Ht 5\' 10"  (1.778 m)   Wt 116.3 kg (256 lb 6.4 oz)   BMI 36.79 kg/m   Physical Examination: General: Well developed, well nourished, NAD    HEENT: OP clear, mucus membranes moist  SKIN: warm, dry. No rashes. Neuro: No focal deficits  Musculoskeletal: Muscle strength 5/5 all ext  Psychiatric: Mood and affect normal  Neck: No JVD, no carotid bruits, no thyromegaly, no lymphadenopathy.  Lungs:Clear bilaterally, no wheezes, rhonci, crackles Cardiovascular: Regular rate and rhythm. No  murmurs, gallops or rubs. Abdomen:Soft. Bowel sounds present. Non-tender.  Extremities: No lower extremity edema. Pulses are 2 + in the bilateral DP/PT.  Lexiscan stress myoview 05/30/13: Stress Procedure: The patient received IV Lexiscan 0.4 mg over 15-seconds. Technetium 53m Sestamibi injected at 30-seconds. This patient had chest pain, sob, and was lt. Headed with the [employer]. Quantitative spect images were obtained after a 45 minute delay.  Stress ECG: No significant ST segment change suggestive of ischemia.  QPS  Raw Data Images: Acquisition technically good; LVE.  Stress Images: There is decreased uptake in the inferior wall.  Rest Images: There is decreased uptake in the inferior wall, less prominent compared to the stress images.  Subtraction (SDS): These findings are consistent with prior inferior infarct and mild peri-infarct ischemia.  Transient Ischemic Dilatation (Normal <1.22): 1.01  Lung/Heart Ratio (Normal <0.45): 0.37  Quantitative Gated Spect Images  QGS EDV: 122 ml  QGS ESV: 61 ml  Impression  Exercise Capacity: Lexiscan with no exercise.  BP Response: Normal blood pressure response.  Clinical Symptoms: There is chest pain and dyspnea.  ECG Impression: No significant ST segment change suggestive of ischemia.  Comparison with Prior Nuclear Study: Compared to 05/15/10, anterior defect absent and inferior infarct/ischemia new.  Overall Impression: Low risk stress nuclear study with a small, moderate intensity, partially reversible inferior defect consistent with prior inferior infarct and mild peri-infarct ischemia.  LV Ejection  Fraction: 50%. LV Wall Motion: Mild inferior hypokinesis.  Cardiac cath 06/14/13: Left main: No obstructive disease.  Left Anterior Descending Artery: Moderate to large caliber vessel that courses to the apex. The proximal stent is patent with no restenosis. The mid and distal vessel has no obstructive disease. There are two small caliber diagonal branches with no obstructive disease.  Circumflex Artery: Large caliber vessel with early intermediate branch followed by two moderate sized OM branches. No obstructive disease noted.  Right Coronary Artery: Small non-dominant vessel with no obstructive disease noted.  Left Ventricular Angiogram: LVEF 50%.  Impression:  1. Single vessel CAD with patent mid LAD stent  2. Normal LV function  EKG:  EKG is  ordered today. The ekg ordered today demonstrates NSR, rate 74 bpm. RBBB  Recent Labs: 06/12/2015: ALT 52; BUN 18; Creatinine 0.7; Hemoglobin 14.7; Platelets 158; Potassium 4.0; Sodium 139; TSH 1.16   Lipid Panel    Component Value Date/Time   CHOL 140 06/12/2015   TRIG 250 (A) 06/12/2015   HDL 38 06/12/2015   CHOLHDL 4 06/21/2014 0841   VLDL 49.8 (H) 06/21/2014 0841   LDLCALC 51 06/12/2015   LDLDIRECT 73.0 06/21/2014 0841     Wt Readings from Last 3 Encounters:  12/09/15 116.3 kg (256 lb 6.4 oz)  07/05/15 114.8 kg (253 lb 1.6 oz)  02/04/15 115.7 kg (255 lb)     Other studies Reviewed: Additional studies/ records that were reviewed today include: . Review of the above records demonstrates:    Assessment and Plan:   1. CAD: He has CAD with prior LAD stent placement. Last cath 06/14/13 with stable disease. He has no chest pain but he does have dyspnea. He notices this when walking up an incline. This may be an anginal equivalent although he is overweight and in poor physical condition. His BP is elevated. I think we need to work on controlling his BP. He will check it at home and we can adjust therapy based on his home readings. I have  asked him to begin daily exercise and shoot for  10 lb weight loss over the next month as I think some of his dyspnea is due to deconditioning. I will see him back in several weeks and if still dyspneic, may have to consider cath. Continue ASA, Plavix, statin, beta blocker.   2. HTN: BP elevated today. He will check at home for next week and let us know if elevated and can increase Lisinopril.    3. Hyperlipidemia: He is on a statin. Lipids well controlled.    Current medicines are reviewed at length with the patient today.  The patient does not have concerns regarding medicines.  The following changes have been made:  no change  Labs/ tests ordered today include:   Orders Placed This Encounter  Procedures  . EKG 12-Lead    Disposition:   FU with me in 1 month  Signed, Lauree Chandler, MD 12/09/2015 4:54 PM    [facility] [address], [place], [place]  [address] Phone: [phone]; Fax: [phone]

## 2015-12-13 ENCOUNTER — Other Ambulatory Visit: Payer: Self-pay | Admitting: *Deleted

## 2015-12-13 ENCOUNTER — Encounter: Payer: Self-pay | Admitting: Cardiovascular Disease

## 2015-12-13 DIAGNOSIS — I10 Essential (primary) hypertension: Secondary | ICD-10-CM

## 2015-12-13 MED ORDER — LISINOPRIL 40 MG PO TABS: 40.0000 mg | ORAL_TABLET | Freq: Every day | ORAL | 3 refills | Status: DC

## 2015-12-15 ENCOUNTER — Encounter: Payer: Self-pay | Admitting: Cardiovascular Disease

## 2015-12-17 ENCOUNTER — Encounter: Payer: Self-pay | Admitting: Cardiovascular Disease

## 2015-12-19 ENCOUNTER — Other Ambulatory Visit: Payer: Self-pay | Admitting: *Deleted

## 2015-12-19 ENCOUNTER — Encounter: Payer: Self-pay | Admitting: Cardiovascular Disease

## 2015-12-19 DIAGNOSIS — I2510 Atherosclerotic heart disease of native coronary artery without angina pectoris: Secondary | ICD-10-CM

## 2015-12-19 MED ORDER — METOPROLOL SUCCINATE ER 50 MG PO TB24: 50.0000 mg | ORAL_TABLET | Freq: Every day | ORAL | 2 refills | Status: DC

## 2015-12-20 ENCOUNTER — Other Ambulatory Visit (INDEPENDENT_AMBULATORY_CARE_PROVIDER_SITE_OTHER): Payer: BLUE CROSS/BLUE SHIELD

## 2015-12-20 DIAGNOSIS — I10 Essential (primary) hypertension: Secondary | ICD-10-CM

## 2015-12-20 LAB — BASIC METABOLIC PANEL
BUN: 18 mg/dL (ref 7–25)
CHLORIDE: 101 mmol/L (ref 98–110)
CO2: 28 mmol/L (ref 20–31)
CREATININE: 0.86 mg/dL (ref 0.70–1.25)
Calcium: 9 mg/dL (ref 8.6–10.3)
Glucose, Bld: 273 mg/dL — ABNORMAL HIGH (ref 65–99)
Potassium: 4.6 mmol/L (ref 3.5–5.3)
Sodium: 139 mmol/L (ref 135–146)

## 2016-01-05 NOTE — Progress Notes (Signed)
Chief Complaint  Patient presents with  . Shortness of Breath    History of Present Illness: 65 y.o. male with a history of CAD, DM, HTN, hyperlipidemia who is here today for cardiac follow up. He presented to the hospital on 05/14/10 with chest pain and ruled out for MI. Stress myoview was positive for anteroseptal ischemia and EF of 52%. Cardiac catheterization demonstrated single vessel CAD with a mid LAD stenosis of 90%. This was treated with a 3.0 x 16 mm Promus Element DES. 48 hour Holter monitor 2012 with sinus, rare PVCs and PACs. Admitted to [facility] May 2013 with chest pain. Cardiac cath May 2013 with stable CAD, patent LAD stent. He was seen in my office 05/19/13 and had c/o dyspnea with exertion, no chest pain. Lexiscan stress myoview 05/30/13 with LVEF=50%, possible inferior wall ischemia. Cardiac cath 06/14/13 with patent LAD stent and no disease in the small RCA or Circumflex. I saw him in the office 12/09/15 and he c/o dyspnea, especially when walking up hills. No chest pain. BP was elevated. Plan to control BP and then re-evaluate dyspnea.   He is here today for follow up.  No chest pain or arm pain. He continues to have dyspnea with minimal exertion. No cough, fever or chills. No LE edema. Weight is stable. He did have left arm pain along with dyspnea prior to his stent placement in 2012. His energy level has been normal. He has been walking every night with his wife. We have adjusted BP meds via MyChart messaging but BP still elevated.    Primary care: [name], MD  Past Medical History:  Diagnosis Date  . ASTHMA 06/08/2008  . CAD (coronary artery disease)    a. s/p Promus DES (3.0 x 16 mm) to mid LAD 05/16/10;  b. Myoview 05/15/10: anteroseptal ischemia with EF 52%;   c. Cath 05/16/10: single vessel CAD with mLAD 90% tx with PCI and EF 50%;  d. 07/2011 Cath:  Patent stent, nonobs dzs, NL EF.  Marland Kitchen Chronic kidney disease   . Colonic polyp   . DIABETES MELLITUS, TYPE II 06/08/2008  . GERD  (gastroesophageal reflux disease)   . Hemorrhoids   . HYPERLIPIDEMIA 06/08/2008  . HYPERTENSION 06/08/2008  . Impotence of organic origin 08/24/2008  . Myocardial infarction 04/2010  . Pneumonia    hx of    Past Surgical History:  Procedure Laterality Date  . CORONARY ANGIOPLASTY WITH STENT PLACEMENT    . LEFT HEART CATHETERIZATION WITH CORONARY ANGIOGRAM N/A 07/29/2011   Procedure: LEFT HEART CATHETERIZATION WITH CORONARY ANGIOGRAM;  Surgeon: Burnell Blanks, MD;  Location: [facility] CATH LAB;  Service: Cardiovascular;  Laterality: N/A;  . LEFT HEART CATHETERIZATION WITH CORONARY ANGIOGRAM N/A 06/14/2013   Procedure: LEFT HEART CATHETERIZATION WITH CORONARY ANGIOGRAM;  Surgeon: Burnell Blanks, MD;  Location: [facility] CATH LAB;  Service: Cardiovascular;  Laterality: N/A;    Current Outpatient Prescriptions  Medication Sig Dispense Refill  . Ascorbic Acid (VITAMIN C) 1000 MG tablet Take 1,000 mg by mouth 2 (two) times daily.     Marland Kitchen aspirin 81 MG tablet Take 1 tablet (81 mg total) by mouth daily. 30 tablet 0  . Biotin 1000 MCG tablet Take 1,000 mcg by mouth every evening.     . calcium carbonate (OS-CAL) 600 MG TABS tablet Take 600 mg by mouth 2 (two) times daily with a meal.    . Cholecalciferol 1000 UNITS TBDP Take 1,000 Units by mouth daily.    . clopidogrel (  PLAVIX) 75 MG tablet Take 1 tablet (75 mg total) by mouth daily. 90 tablet 3  . diclofenac sodium (VOLTAREN) 1 % GEL Apply 2 g topically 4 (four) times daily. Rub into affected area of foot 2 to 4 times daily 100 g 2  . fluticasone (FLONASE) 50 MCG/ACT nasal spray Place 1 spray into both nostrils daily.  2  . glucagon (GLUCAGON EMERGENCY) 1 MG injection Inject 1 mg into the vein once as needed (Low blood sugar).     . Insulin Human (INSULIN PUMP) 100 unit/ml SOLN Inject into the skin continuous. Humalog; basal rate: 50 - 85 units per day.    Marland Kitchen JARDIANCE 10 MG TABS tablet Take 10 mg by mouth daily.  11  . lisinopril (PRINIVIL,ZESTRIL)  40 MG tablet Take 1 tablet (40 mg total) by mouth daily. 90 tablet 3  . meloxicam (MOBIC) 15 MG tablet Take 15 mg by mouth daily as needed.     . metFORMIN (GLUCOPHAGE) 500 MG tablet Take 1,000 mg by mouth 2 (two) times daily with a meal.    . mometasone (NASONEX) 50 MCG/ACT nasal spray Place 1 spray into the nose 2 (two) times daily as needed (allergies).    . Multiple Vitamins-Minerals (ICAPS MV PO) Take 1 capsule by mouth every evening.    . nitroGLYCERIN (NITROSTAT) 0.4 MG SL tablet Place 0.4 mg under the tongue every 5 (five) minutes as needed for chest pain.     . Omega-3 Fatty Acids (FISH OIL) 1200 MG CAPS Take 1,200 mg by mouth daily.    . simvastatin (ZOCOR) 20 MG tablet Take 1 tablet (20 mg total) by mouth daily at 6 PM. 90 tablet 2  . tadalafil (CIALIS) 20 MG tablet Take 20 mg by mouth every other day as needed for erectile dysfunction.    . vitamin E 400 UNIT capsule Take 400 Units by mouth every evening.    . Vitamins-Lipotropics (LIPO-FLAVONOID PLUS) TABS Take 1 tablet by mouth 2 (two) times daily.    . metoprolol succinate (TOPROL-XL) 50 MG 24 hr tablet Take 1 tablet (50 mg total) by mouth 2 (two) times daily. Take with or immediately following a meal. 180 tablet 3   No current facility-administered medications for this visit.     No Known Allergies  Social History   Social History  . Marital status: Married    Spouse name: N/A  . Number of children: 2  . Years of education: N/A   Occupational History  . Sales    Social History Main Topics  . Smoking status: Former Smoker    Packs/day: 1.00    Years: 35.00    Types: Cigarettes    Quit date: 05/23/2002  . Smokeless tobacco: Never Used  . Alcohol use 8.4 oz/week    14 Shots of liquor per week     Comment: daily  . Drug use: No  . Sexual activity: Not Currently   Other Topics Concern  . Not on file   Social History Narrative  . No narrative on file    Family History  Problem Relation Age of Onset  .  Diabetes Father   . Heart disease Father   . Angina Father   . Dementia Mother     Review of Systems:  As stated in the HPI and otherwise negative.   BP (!) 142/92   Pulse 72   Ht 5\' 10"  (1.778 m)   Wt 253 lb (114.8 kg)   BMI 36.30 kg/m  Physical Examination: General: Well developed, well nourished, NAD  HEENT: OP clear, mucus membranes moist  SKIN: warm, dry. No rashes. Neuro: No focal deficits  Musculoskeletal: Muscle strength 5/5 all ext  Psychiatric: Mood and affect normal  Neck: No JVD, no carotid bruits, no thyromegaly, no lymphadenopathy.  Lungs:Clear bilaterally, no wheezes, rhonci, crackles Cardiovascular: Regular rate and rhythm. No murmurs, gallops or rubs. Abdomen:Soft. Bowel sounds present. Non-tender.  Extremities: No lower extremity edema. Pulses are 2 + in the bilateral DP/PT.  Lexiscan stress myoview 05/30/13: Stress Procedure: The patient received IV Lexiscan 0.4 mg over 15-seconds. Technetium 52m Sestamibi injected at 30-seconds. This patient had chest pain, sob, and was lt. Headed with the [employer]. Quantitative spect images were obtained after a 45 minute delay.  Stress ECG: No significant ST segment change suggestive of ischemia.  QPS  Raw Data Images: Acquisition technically good; LVE.  Stress Images: There is decreased uptake in the inferior wall.  Rest Images: There is decreased uptake in the inferior wall, less prominent compared to the stress images.  Subtraction (SDS): These findings are consistent with prior inferior infarct and mild peri-infarct ischemia.  Transient Ischemic Dilatation (Normal <1.22): 1.01  Lung/Heart Ratio (Normal <0.45): 0.37  Quantitative Gated Spect Images  QGS EDV: 122 ml  QGS ESV: 61 ml  Impression  Exercise Capacity: Lexiscan with no exercise.  BP Response: Normal blood pressure response.  Clinical Symptoms: There is chest pain and dyspnea.  ECG Impression: No significant ST segment change suggestive of ischemia.    Comparison with Prior Nuclear Study: Compared to 05/15/10, anterior defect absent and inferior infarct/ischemia new.  Overall Impression: Low risk stress nuclear study with a small, moderate intensity, partially reversible inferior defect consistent with prior inferior infarct and mild peri-infarct ischemia.  LV Ejection Fraction: 50%. LV Wall Motion: Mild inferior hypokinesis.  Cardiac cath 06/14/13: Left main: No obstructive disease.  Left Anterior Descending Artery: Moderate to large caliber vessel that courses to the apex. The proximal stent is patent with no restenosis. The mid and distal vessel has no obstructive disease. There are two small caliber diagonal branches with no obstructive disease.  Circumflex Artery: Large caliber vessel with early intermediate branch followed by two moderate sized OM branches. No obstructive disease noted.  Right Coronary Artery: Small non-dominant vessel with no obstructive disease noted.  Left Ventricular Angiogram: LVEF 50%.  Impression:  1. Single vessel CAD with patent mid LAD stent  2. Normal LV function  EKG:  EKG is not ordered today. The ekg ordered today demonstrates   Recent Labs: 06/12/2015: ALT 52; Hemoglobin 14.7; Platelets 158; TSH 1.16 12/20/2015: BUN 18; Creat 0.86; Potassium 4.6; Sodium 139   Lipid Panel    Component Value Date/Time   CHOL 140 06/12/2015   TRIG 250 (A) 06/12/2015   HDL 38 06/12/2015   CHOLHDL 4 06/21/2014 0841   VLDL 49.8 (H) 06/21/2014 0841   LDLCALC 51 06/12/2015   LDLDIRECT 73.0 06/21/2014 0841     Wt Readings from Last 3 Encounters:  01/06/16 253 lb (114.8 kg)  12/09/15 256 lb 6.4 oz (116.3 kg)  07/05/15 253 lb 1.6 oz (114.8 kg)     Other studies Reviewed: Additional studies/ records that were reviewed today include: . Review of the above records demonstrates:    Assessment and Plan:   1. CAD with dyspnea: He has CAD with prior LAD stent placement. Last cath 06/14/13 with stable disease. He has no  chest pain but he does have dyspnea. He notices  this when walking up an incline. This may be an anginal equivalent although he is overweight and in poor physical condition. His BP is still elevated. Will increase Toprol to 50 mg BID. I will arrange an echo to assess LVEF given dyspnea. It is likely that his dyspnea is due to obesity with deconditioning. He did smoke for 30 years. I will attempt better BP control over next month along with exercise and weight loss and see if dyspnea improves. If echo normal and he still has dyspnea at f/u, will consider cardiac cath. Continue ASA, Plavix, statin, beta blocker.   2. HTN: BP elevated today. Will continue Lisinopril and will increase Toprol to 50 mg BID.     3. Hyperlipidemia: He is on a statin. Lipids well controlled.   4. Dyspnea: see above. ? Etiology. Could be anginal equivalent. He has had elevated BP. Will attempt better BP control. Echo to assess LVEF and exclude valve disease. This could also be related to underlying lung disease, obesity and deconditioning.    Current medicines are reviewed at length with the patient today.  The patient does not have concerns regarding medicines.  The following changes have been made:  no change  Labs/ tests ordered today include:   Orders Placed This Encounter  Procedures  . ECHOCARDIOGRAM COMPLETE    Disposition:   F/U with me in 6 weeks.   Signed, Lauree Chandler, MD 01/06/2016 11:15 AM    [address], [place],   [address] Phone: [phone]; Fax: [phone]

## 2016-01-06 ENCOUNTER — Encounter: Payer: Self-pay | Admitting: Cardiovascular Disease

## 2016-01-06 ENCOUNTER — Ambulatory Visit (INDEPENDENT_AMBULATORY_CARE_PROVIDER_SITE_OTHER): Payer: BLUE CROSS/BLUE SHIELD | Admitting: Cardiovascular Disease

## 2016-01-06 VITALS — BP 142/92 | HR 72 | Ht 70.0 in | Wt 253.0 lb

## 2016-01-06 DIAGNOSIS — E7800 Pure hypercholesterolemia, unspecified: Secondary | ICD-10-CM

## 2016-01-06 DIAGNOSIS — I10 Essential (primary) hypertension: Secondary | ICD-10-CM | POA: Diagnosis not present

## 2016-01-06 DIAGNOSIS — R0600 Dyspnea, unspecified: Secondary | ICD-10-CM | POA: Diagnosis not present

## 2016-01-06 DIAGNOSIS — I2510 Atherosclerotic heart disease of native coronary artery without angina pectoris: Secondary | ICD-10-CM

## 2016-01-06 MED ORDER — METOPROLOL SUCCINATE ER 50 MG PO TB24: 50.0000 mg | ORAL_TABLET | Freq: Two times a day (BID) | ORAL | 3 refills | Status: DC

## 2016-01-06 NOTE — Patient Instructions (Signed)
Medication Instructions:  Your physician has recommended you make the following change in your medication: Increase Toprol to 50 mg by mouth twice daily.    Labwork: none  Testing/Procedures: Your physician has requested that you have an echocardiogram. Echocardiography is a painless test that uses sound waves to create images of your heart. It provides your doctor with information about the size and shape of your heart and how well your heart's chambers and valves are working. This procedure takes approximately one hour. There are no restrictions for this procedure.    Follow-Up: Your physician recommends that you schedule a follow-up appointment after Thanksgiving.  Scheduled for December 4,2017 at 11:00   Any Other Special Instructions Will Be Listed Below (If Applicable).     If you need a refill on your cardiac medications before your next appointment, please call your pharmacy.

## 2016-01-09 ENCOUNTER — Other Ambulatory Visit: Payer: Self-pay | Admitting: Family Medicine

## 2016-01-27 ENCOUNTER — Other Ambulatory Visit: Payer: Self-pay

## 2016-01-27 ENCOUNTER — Ambulatory Visit (HOSPITAL_COMMUNITY): Payer: BLUE CROSS/BLUE SHIELD | Attending: Cardiology

## 2016-01-27 DIAGNOSIS — I10 Essential (primary) hypertension: Secondary | ICD-10-CM | POA: Insufficient documentation

## 2016-01-27 DIAGNOSIS — R0600 Dyspnea, unspecified: Secondary | ICD-10-CM

## 2016-01-27 DIAGNOSIS — E119 Type 2 diabetes mellitus without complications: Secondary | ICD-10-CM | POA: Diagnosis not present

## 2016-01-27 DIAGNOSIS — Z6836 Body mass index (BMI) 36.0-36.9, adult: Secondary | ICD-10-CM | POA: Diagnosis not present

## 2016-01-27 DIAGNOSIS — I2510 Atherosclerotic heart disease of native coronary artery without angina pectoris: Secondary | ICD-10-CM | POA: Insufficient documentation

## 2016-01-27 DIAGNOSIS — E785 Hyperlipidemia, unspecified: Secondary | ICD-10-CM | POA: Insufficient documentation

## 2016-01-27 DIAGNOSIS — I340 Nonrheumatic mitral (valve) insufficiency: Secondary | ICD-10-CM | POA: Diagnosis not present

## 2016-01-27 DIAGNOSIS — E669 Obesity, unspecified: Secondary | ICD-10-CM | POA: Insufficient documentation

## 2016-01-27 DIAGNOSIS — I252 Old myocardial infarction: Secondary | ICD-10-CM | POA: Insufficient documentation

## 2016-02-17 ENCOUNTER — Encounter: Payer: Self-pay | Admitting: Cardiovascular Disease

## 2016-02-17 ENCOUNTER — Ambulatory Visit (INDEPENDENT_AMBULATORY_CARE_PROVIDER_SITE_OTHER): Payer: BLUE CROSS/BLUE SHIELD | Admitting: Cardiovascular Disease

## 2016-02-17 VITALS — BP 140/68 | HR 70 | Ht 70.0 in | Wt 252.5 lb

## 2016-02-17 DIAGNOSIS — I10 Essential (primary) hypertension: Secondary | ICD-10-CM

## 2016-02-17 DIAGNOSIS — R0600 Dyspnea, unspecified: Secondary | ICD-10-CM | POA: Diagnosis not present

## 2016-02-17 DIAGNOSIS — I2510 Atherosclerotic heart disease of native coronary artery without angina pectoris: Secondary | ICD-10-CM | POA: Diagnosis not present

## 2016-02-17 DIAGNOSIS — E7800 Pure hypercholesterolemia, unspecified: Secondary | ICD-10-CM | POA: Diagnosis not present

## 2016-02-17 MED ORDER — AMLODIPINE BESYLATE 5 MG PO TABS: 5.0000 mg | ORAL_TABLET | Freq: Every day | ORAL | 6 refills | Status: DC

## 2016-02-17 MED ORDER — FUROSEMIDE 20 MG PO TABS: 20.0000 mg | ORAL_TABLET | Freq: Every day | ORAL | 6 refills | Status: DC

## 2016-02-17 NOTE — Progress Notes (Signed)
Chief Complaint  Patient presents with  . Hypertension  . Hyperlipidemia  . atherosclerosis    History of Present Illness: 65 y.o. male with a history of CAD, DM, HTN, hyperlipidemia who is here today for cardiac follow up. He presented to the hospital on 05/14/10 with chest pain and ruled out for MI. Stress myoview was positive for anteroseptal ischemia and EF of 52%. Cardiac catheterization demonstrated single vessel CAD with a mid LAD stenosis of 90%. This was treated with a 3.0 x 16 mm Promus Element DES. 48 hour Holter monitor 2012 with sinus, rare PVCs and PACs. Admitted to [facility] May 2013 with chest pain. Cardiac cath May 2013 with stable CAD, patent LAD stent. He was seen in my office 05/19/13 and had c/o dyspnea with exertion, no chest pain. Lexiscan stress myoview 05/30/13 with LVEF=50%, possible inferior wall ischemia. Cardiac cath 06/14/13 with patent LAD stent and no disease in the small RCA or Circumflex. I saw him in the office 12/09/15 and he c/o dyspnea, especially when walking up hills. No chest pain. BP was elevated. Plan to control BP and then re-evaluate dyspnea. Echo 01/27/16 with normal LV systolic function, mild MR. Grade 2 diastolic dysfunction.   He is here today for follow up.  No chest pain or arm pain. He continues to have dyspnea with minimal exertion. No cough, fever or chills. No LE edema. Weight is stable. He did have left arm pain along with dyspnea prior to his stent placement in 2012. His energy level has been normal. He has been walking every night with his wife.     Primary care: [name], MD  Past Medical History:  Diagnosis Date  . ASTHMA 06/08/2008  . CAD (coronary artery disease)    a. s/p Promus DES (3.0 x 16 mm) to mid LAD 05/16/10;  b. Myoview 05/15/10: anteroseptal ischemia with EF 52%;   c. Cath 05/16/10: single vessel CAD with mLAD 90% tx with PCI and EF 50%;  d. 07/2011 Cath:  Patent stent, nonobs dzs, NL EF.  Marland Kitchen Chronic kidney disease   . Colonic polyp    . DIABETES MELLITUS, TYPE II 06/08/2008  . GERD (gastroesophageal reflux disease)   . Hemorrhoids   . HYPERLIPIDEMIA 06/08/2008  . HYPERTENSION 06/08/2008  . Impotence of organic origin 08/24/2008  . Myocardial infarction 04/2010  . Pneumonia    hx of    Past Surgical History:  Procedure Laterality Date  . CORONARY ANGIOPLASTY WITH STENT PLACEMENT    . LEFT HEART CATHETERIZATION WITH CORONARY ANGIOGRAM N/A 07/29/2011   Procedure: LEFT HEART CATHETERIZATION WITH CORONARY ANGIOGRAM;  Surgeon: Burnell Blanks, MD;  Location: [facility] CATH LAB;  Service: Cardiovascular;  Laterality: N/A;  . LEFT HEART CATHETERIZATION WITH CORONARY ANGIOGRAM N/A 06/14/2013   Procedure: LEFT HEART CATHETERIZATION WITH CORONARY ANGIOGRAM;  Surgeon: Burnell Blanks, MD;  Location: [name] CATH LAB;  Service: Cardiovascular;  Laterality: N/A;    Current Outpatient Prescriptions  Medication Sig Dispense Refill  . Ascorbic Acid (VITAMIN C) 1000 MG tablet Take 1,000 mg by mouth 2 (two) times daily.     Marland Kitchen aspirin 81 MG tablet Take 1 tablet (81 mg total) by mouth daily. 30 tablet 0  . benzonatate (TESSALON) 100 MG capsule SWALLOW WHOLE ONE CAPSULE 3 TIMES A DAY DO NOT CHEW OR BREAK  0  . Biotin 1000 MCG tablet Take 1,000 mcg by mouth every evening.     . Cholecalciferol 1000 UNITS TBDP Take 1,000 Units by mouth daily.    Marland Kitchen  clopidogrel (PLAVIX) 75 MG tablet Take 1 tablet (75 mg total) by mouth daily. 90 tablet 3  . fluticasone (FLONASE) 50 MCG/ACT nasal spray Place 1 spray into both nostrils daily.  2  . glucagon (GLUCAGON EMERGENCY) 1 MG injection Inject 1 mg into the vein once as needed (Low blood sugar).     . Insulin Human (INSULIN PUMP) 100 unit/ml SOLN Inject into the skin continuous. Humalog; basal rate: 50 - 85 units per day.    Marland Kitchen JARDIANCE 10 MG TABS tablet Take 10 mg by mouth daily.  11  . lisinopril (PRINIVIL,ZESTRIL) 40 MG tablet Take 1 tablet (40 mg total) by mouth daily. 90 tablet 3  . metFORMIN  (GLUCOPHAGE) 500 MG tablet Take 1,000 mg by mouth 2 (two) times daily with a meal.    . metoprolol succinate (TOPROL-XL) 50 MG 24 hr tablet Take 1 tablet (50 mg total) by mouth 2 (two) times daily. Take with or immediately following a meal. 180 tablet 3  . mometasone (NASONEX) 50 MCG/ACT nasal spray Place 1 spray into the nose 2 (two) times daily as needed (allergies).    . Multiple Vitamins-Minerals (ICAPS MV PO) Take 1 capsule by mouth every evening.    . nitroGLYCERIN (NITROSTAT) 0.4 MG SL tablet Place 0.4 mg under the tongue every 5 (five) minutes as needed for chest pain.     . Omega-3 Fatty Acids (FISH OIL) 1200 MG CAPS Take 1,200 mg by mouth daily.    . simvastatin (ZOCOR) 20 MG tablet TAKE 1 TABLET BY MOUTH  DAILY AT 6 PM. 90 tablet 2  . tadalafil (CIALIS) 20 MG tablet Take 20 mg by mouth every other day as needed for erectile dysfunction.    . vitamin E 400 UNIT capsule Take 400 Units by mouth every evening.    Marland Kitchen amLODipine (NORVASC) 5 MG tablet Take 1 tablet (5 mg total) by mouth daily. 30 tablet 6  . furosemide (LASIX) 20 MG tablet Take 1 tablet (20 mg total) by mouth daily. 30 tablet 6   No current facility-administered medications for this visit.     No Known Allergies  Social History   Social History  . Marital status: Married    Spouse name: N/A  . Number of children: 2  . Years of education: N/A   Occupational History  . Sales    Social History Main Topics  . Smoking status: Former Smoker    Packs/day: 1.00    Years: 35.00    Types: Cigarettes    Quit date: 05/23/2002  . Smokeless tobacco: Never Used  . Alcohol use 8.4 oz/week    14 Shots of liquor per week     Comment: daily  . Drug use: No  . Sexual activity: Not Currently   Other Topics Concern  . Not on file   Social History Narrative  . No narrative on file    Family History  Problem Relation Age of Onset  . Diabetes Father   . Heart disease Father   . Angina Father   . Dementia Mother      Review of Systems:  As stated in the HPI and otherwise negative.   BP 140/68   Pulse 70   Ht 5\' 10"  (1.778 m)   Wt 252 lb 8 oz (114.5 kg)   BMI 36.23 kg/m   Physical Examination: General: Well developed, well nourished, NAD  HEENT: OP clear, mucus membranes moist  SKIN: warm, dry. No rashes. Neuro: No focal deficits  Musculoskeletal: Muscle strength 5/5 all ext  Psychiatric: Mood and affect normal  Neck: No JVD, no carotid bruits, no thyromegaly, no lymphadenopathy.  Lungs:Clear bilaterally, no wheezes, rhonci, crackles Cardiovascular: Regular rate and rhythm. No murmurs, gallops or rubs. Abdomen:Soft. Bowel sounds present. Non-tender.  Extremities: No lower extremity edema. Pulses are 2 + in the bilateral DP/PT.  Echo 01/27/16: - Left ventricle: The cavity size was normal. There was mild focal   basal hypertrophy of the septum. Systolic function was normal.   The estimated ejection fraction was in the range of 60% to 65%.   Wall motion was normal; there were no regional wall motion   abnormalities. Features are consistent with a pseudonormal left   ventricular filling pattern, with concomitant abnormal relaxation   and increased filling pressure (grade 2 diastolic dysfunction). - Aortic valve: Trileaflet; mildly thickened, mildly calcified   leaflets. - Mitral valve: Calcified annulus. Mildly thickened leaflets .   There was mild regurgitation. - Left atrium: The atrium was mildly to moderately dilated.   Volume/bsa, ES, (1-plane Simpson&'s, A2C): 45.8 ml/m^2.  Cardiac cath 06/14/13: Left main: No obstructive disease.  Left Anterior Descending Artery: Moderate to large caliber vessel that courses to the apex. The proximal stent is patent with no restenosis. The mid and distal vessel has no obstructive disease. There are two small caliber diagonal branches with no obstructive disease.  Circumflex Artery: Large caliber vessel with early intermediate branch followed by two  moderate sized OM branches. No obstructive disease noted.  Right Coronary Artery: Small non-dominant vessel with no obstructive disease noted.  Left Ventricular Angiogram: LVEF 50%.  Impression:  1. Single vessel CAD with patent mid LAD stent  2. Normal LV function  EKG:  EKG is not ordered today. The ekg ordered today demonstrates   Recent Labs: 06/12/2015: ALT 52; Hemoglobin 14.7; Platelets 158; TSH 1.16 12/20/2015: BUN 18; Creat 0.86; Potassium 4.6; Sodium 139   Lipid Panel    Component Value Date/Time   CHOL 140 06/12/2015   TRIG 250 (A) 06/12/2015   HDL 38 06/12/2015   CHOLHDL 4 06/21/2014 0841   VLDL 49.8 (H) 06/21/2014 0841   LDLCALC 51 06/12/2015   LDLDIRECT 73.0 06/21/2014 0841     Wt Readings from Last 3 Encounters:  02/17/16 252 lb 8 oz (114.5 kg)  01/06/16 253 lb (114.8 kg)  12/09/15 256 lb 6.4 oz (116.3 kg)     Other studies Reviewed: Additional studies/ records that were reviewed today include: . Review of the above records demonstrates:    Assessment and Plan:   1. CAD without angina : He has CAD with prior LAD stent placement. Last cath 06/14/13 with stable disease. He has no chest pain but he does have dyspnea. He notices this when walking up an incline. This may be an anginal equivalent although he is overweight and in poor physical condition. BP is still elevated. Echo with normal LV systolic function 99991111 but he does have diastolic dysfunction. He may have mild volume overload. Will start Lasix 20 mg daily with BMET one week. I will start Norvasc 5 mg daily. Attempt BP control and diuresis over next month along with exercise and weight loss and see if dyspnea improves. Continue ASA, Plavix, statin, beta blocker. If no improvement in dyspnea with treatment of BP and diuresis, will consider repeat cath.   2. HTN: BP elevated today. Will continue Lisinopril and Toprol 50 mg BID and add Norvasc 5 mg daily.      3.  Hyperlipidemia: He is on a statin. Lipids  well controlled.   4. Dyspnea: see above. ? Etiology. Could be anginal equivalent. He has had elevated BP. Will attempt better BP control. This could also be related to underlying lung disease, obesity and deconditioning. Will attempt diuresis as well.    Current medicines are reviewed at length with the patient today.  The patient does not have concerns regarding medicines.  The following changes have been made:  no change  Labs/ tests ordered today include:   Orders Placed This Encounter  Procedures  . Basic Metabolic Panel (BMET)    Disposition:   F/U with me in 6 weeks.   Signed, Lauree Chandler, MD 02/17/2016 11:38 AM    [facility] [place], [place], [place]  [address] Phone: [phone]; Fax: [phone]

## 2016-02-17 NOTE — Patient Instructions (Addendum)
Medication Instructions:  Your physician has recommended you make the following change in your medication:  Start amlodipine 5 mg by mouth daily. Start furosemide 20 mg by mouth daily.   Labwork: Your physician recommends that you return for lab work in: 7-10 days. Scheduled for December 11,2017.  The lab opens at 7:30 AM   Testing/Procedures: none  Follow-Up: Your physician recommends that you schedule a follow-up appointment in: about 2 months.  Scheduled for February 2,2018 at 9:30    Any Other Special Instructions Will Be Listed Below (If Applicable).     If you need a refill on your cardiac medications before your next appointment, please call your pharmacy.

## 2016-02-24 ENCOUNTER — Other Ambulatory Visit: Payer: BLUE CROSS/BLUE SHIELD | Admitting: *Deleted

## 2016-02-24 DIAGNOSIS — R0600 Dyspnea, unspecified: Secondary | ICD-10-CM

## 2016-02-24 DIAGNOSIS — I2510 Atherosclerotic heart disease of native coronary artery without angina pectoris: Secondary | ICD-10-CM

## 2016-02-24 LAB — BASIC METABOLIC PANEL
BUN: 21 mg/dL (ref 7–25)
CALCIUM: 9.6 mg/dL (ref 8.6–10.3)
CO2: 19 mmol/L — ABNORMAL LOW (ref 20–31)
CREATININE: 0.72 mg/dL (ref 0.70–1.25)
Chloride: 106 mmol/L (ref 98–110)
Glucose, Bld: 127 mg/dL — ABNORMAL HIGH (ref 65–99)
Potassium: 4.8 mmol/L (ref 3.5–5.3)
Sodium: 140 mmol/L (ref 135–146)

## 2016-04-13 ENCOUNTER — Other Ambulatory Visit: Payer: Self-pay | Admitting: *Deleted

## 2016-04-13 NOTE — Telephone Encounter (Signed)
Rx's changed to ninety day per request from pharmacy.

## 2016-04-14 MED ORDER — FUROSEMIDE 20 MG PO TABS: 20.0000 mg | ORAL_TABLET | Freq: Every day | ORAL | 3 refills | Status: DC

## 2016-04-14 MED ORDER — AMLODIPINE BESYLATE 5 MG PO TABS: 5.0000 mg | ORAL_TABLET | Freq: Every day | ORAL | 3 refills | Status: DC

## 2016-04-16 NOTE — Progress Notes (Signed)
Chief Complaint  Patient presents with  . Follow-up    History of Present Illness: 66 y.o. male with a history of CAD, DM, HTN, hyperlipidemia who is here today for cardiac follow up. Cardiac catheterization 2012 demonstrated single vessel CAD with a mid LAD stenosis of 90%. This was treated with a 3.0 x 16 mm Promus Element DES. 48 hour Holter monitor 2012 with sinus, rare PVCs and PACs. Admitted to [facility] May 2013 with chest pain. Cardiac cath May 2013 with stable CAD, patent LAD stent. He was seen in my office 05/19/13 and had c/o dyspnea with exertion, no chest pain. Lexiscan stress myoview 05/30/13 with LVEF=50%, possible inferior wall ischemia. Cardiac cath 06/14/13 with patent LAD stent and no disease in the small RCA or Circumflex. I saw him in the office 12/09/15 and he c/o dyspnea, especially when walking up hills. No chest pain. BP was elevated. Plan to control BP and then re-evaluate dyspnea. Echo 01/27/16 with normal LV systolic function, mild MR. Grade 2 diastolic dysfunction.   He is here today for follow up.  No chest pain or arm pain. His dyspnea has resolved. He retired 3 weeks ago. His energy level has been normal. He has been walking every night with his wife.     Primary care: [name], MD  Past Medical History:  Diagnosis Date  . ASTHMA 06/08/2008  . CAD (coronary artery disease)    a. s/p Promus DES (3.0 x 16 mm) to mid LAD 05/16/10;  b. Myoview 05/15/10: anteroseptal ischemia with EF 52%;   c. Cath 05/16/10: single vessel CAD with mLAD 90% tx with PCI and EF 50%;  d. 07/2011 Cath:  Patent stent, nonobs dzs, NL EF.  Marland Kitchen Chronic kidney disease   . Colonic polyp   . DIABETES MELLITUS, TYPE II 06/08/2008  . GERD (gastroesophageal reflux disease)   . Hemorrhoids   . HYPERLIPIDEMIA 06/08/2008  . HYPERTENSION 06/08/2008  . Impotence of organic origin 08/24/2008  . Myocardial infarction 04/2010  . Pneumonia    hx of    Past Surgical History:  Procedure Laterality Date  . CORONARY  ANGIOPLASTY WITH STENT PLACEMENT    . LEFT HEART CATHETERIZATION WITH CORONARY ANGIOGRAM N/A 07/29/2011   Procedure: LEFT HEART CATHETERIZATION WITH CORONARY ANGIOGRAM;  Surgeon: Burnell Blanks, MD;  Location: [facility] CATH LAB;  Service: Cardiovascular;  Laterality: N/A;  . LEFT HEART CATHETERIZATION WITH CORONARY ANGIOGRAM N/A 06/14/2013   Procedure: LEFT HEART CATHETERIZATION WITH CORONARY ANGIOGRAM;  Surgeon: Burnell Blanks, MD;  Location: [facility] CATH LAB;  Service: Cardiovascular;  Laterality: N/A;    Current Outpatient Prescriptions  Medication Sig Dispense Refill  . Ascorbic Acid (VITAMIN C) 1000 MG tablet Take 1,000 mg by mouth 2 (two) times daily.     Marland Kitchen aspirin 81 MG tablet Take 1 tablet (81 mg total) by mouth daily. 30 tablet 0  . benzonatate (TESSALON) 100 MG capsule SWALLOW WHOLE ONE CAPSULE 3 TIMES A DAY DO NOT CHEW OR BREAK  0  . Biotin 1000 MCG tablet Take 1,000 mcg by mouth every evening.     . Cholecalciferol 1000 UNITS TBDP Take 1,000 Units by mouth daily.    . clopidogrel (PLAVIX) 75 MG tablet Take 1 tablet (75 mg total) by mouth daily. 90 tablet 3  . fluticasone (FLONASE) 50 MCG/ACT nasal spray Place 1 spray into both nostrils daily.  2  . furosemide (LASIX) 20 MG tablet Take 1 tablet (20 mg total) by mouth daily. 90 tablet 3  .  glucagon (GLUCAGON EMERGENCY) 1 MG injection Inject 1 mg into the vein once as needed (Low blood sugar).     . Insulin Human (INSULIN PUMP) 100 unit/ml SOLN Inject into the skin continuous. Humalog; basal rate: 50 - 85 units per day.    Marland Kitchen JARDIANCE 10 MG TABS tablet Take 10 mg by mouth daily.  11  . lisinopril (PRINIVIL,ZESTRIL) 40 MG tablet Take 40 mg by mouth daily.    . metFORMIN (GLUCOPHAGE) 500 MG tablet Take 1,000 mg by mouth 2 (two) times daily with a meal.    . metoprolol succinate (TOPROL-XL) 50 MG 24 hr tablet Take 50 mg by mouth 2 (two) times daily. Take with or immediately following a meal.    . mometasone (NASONEX) 50 MCG/ACT nasal  spray Place 1 spray into the nose 2 (two) times daily as needed (allergies).    . Multiple Vitamins-Minerals (ICAPS MV PO) Take 1 capsule by mouth every evening.    . nitroGLYCERIN (NITROSTAT) 0.4 MG SL tablet Place 0.4 mg under the tongue every 5 (five) minutes as needed for chest pain.     . Omega-3 Fatty Acids (FISH OIL) 1200 MG CAPS Take 1,200 mg by mouth daily.    . simvastatin (ZOCOR) 20 MG tablet TAKE 1 TABLET BY MOUTH  DAILY AT 6 PM. 90 tablet 2  . tadalafil (CIALIS) 20 MG tablet Take 20 mg by mouth every other day as needed for erectile dysfunction.    . vitamin E 400 UNIT capsule Take 400 Units by mouth every evening.    Marland Kitchen amLODipine (NORVASC) 10 MG tablet Take 1 tablet (10 mg total) by mouth daily. 90 tablet 3   No current facility-administered medications for this visit.     No Known Allergies  Social History   Social History  . Marital status: Married    Spouse name: N/A  . Number of children: 2  . Years of education: N/A   Occupational History  . Sales    Social History Main Topics  . Smoking status: Former Smoker    Packs/day: 1.00    Years: 35.00    Types: Cigarettes    Quit date: 05/23/2002  . Smokeless tobacco: Never Used  . Alcohol use 8.4 oz/week    14 Shots of liquor per week     Comment: daily  . Drug use: No  . Sexual activity: Not Currently   Other Topics Concern  . Not on file   Social History Narrative  . No narrative on file    Family History  Problem Relation Age of Onset  . Diabetes Father   . Heart disease Father   . Angina Father   . Dementia Mother     Review of Systems:  As stated in the HPI and otherwise negative.   BP (!) 170/84 (BP Location: Left Arm)   Pulse 65   Ht 5\' 10"  (1.778 m)   Wt 256 lb 9.6 oz (116.4 kg)   BMI 36.82 kg/m   Physical Examination: General: Well developed, well nourished, NAD  HEENT: OP clear, mucus membranes moist  SKIN: warm, dry. No rashes. Neuro: No focal deficits  Musculoskeletal: Muscle  strength 5/5 all ext  Psychiatric: Mood and affect normal  Neck: No JVD, no carotid bruits, no thyromegaly, no lymphadenopathy.  Lungs:Clear bilaterally, no wheezes, rhonci, crackles Cardiovascular: Regular rate and rhythm. No murmurs, gallops or rubs. Abdomen:Soft. Bowel sounds present. Non-tender.  Extremities: No lower extremity edema. Pulses are 2 + in the bilateral  DP/PT.  Echo 01/27/16: - Left ventricle: The cavity size was normal. There was mild focal   basal hypertrophy of the septum. Systolic function was normal.   The estimated ejection fraction was in the range of 60% to 65%.   Wall motion was normal; there were no regional wall motion   abnormalities. Features are consistent with a pseudonormal left   ventricular filling pattern, with concomitant abnormal relaxation   and increased filling pressure (grade 2 diastolic dysfunction). - Aortic valve: Trileaflet; mildly thickened, mildly calcified   leaflets. - Mitral valve: Calcified annulus. Mildly thickened leaflets .   There was mild regurgitation. - Left atrium: The atrium was mildly to moderately dilated.   Volume/bsa, ES, (1-plane Simpson&'s, A2C): 45.8 ml/m^2.  Cardiac cath 06/14/13: Left main: No obstructive disease.  Left Anterior Descending Artery: Moderate to large caliber vessel that courses to the apex. The proximal stent is patent with no restenosis. The mid and distal vessel has no obstructive disease. There are two small caliber diagonal branches with no obstructive disease.  Circumflex Artery: Large caliber vessel with early intermediate branch followed by two moderate sized OM branches. No obstructive disease noted.  Right Coronary Artery: Small non-dominant vessel with no obstructive disease noted.  Left Ventricular Angiogram: LVEF 50%.  Impression:  1. Single vessel CAD with patent mid LAD stent  2. Normal LV function  EKG:  EKG is not ordered today. The ekg ordered today demonstrates   Recent  Labs: 06/12/2015: ALT 52; Hemoglobin 14.7; Platelets 158; TSH 1.16 02/24/2016: BUN 21; Creat 0.72; Potassium 4.8; Sodium 140   Lipid Panel    Component Value Date/Time   CHOL 140 06/12/2015   TRIG 250 (A) 06/12/2015   HDL 38 06/12/2015   CHOLHDL 4 06/21/2014 0841   VLDL 49.8 (H) 06/21/2014 0841   LDLCALC 51 06/12/2015   LDLDIRECT 73.0 06/21/2014 0841     Wt Readings from Last 3 Encounters:  04/17/16 256 lb 9.6 oz (116.4 kg)  02/17/16 252 lb 8 oz (114.5 kg)  01/06/16 253 lb (114.8 kg)     Other studies Reviewed: Additional studies/ records that were reviewed today include: . Review of the above records demonstrates:    Assessment and Plan:   1. CAD without angina: No chest pain suggestive of angina. He has CAD with prior LAD stent placement. Last cath 06/14/13 with stable disease.  Echo with normal LV systolic function [id number] but he does have diastolic dysfunction. Continue ASA, Plavix, statin, beta blocker.  2. HTN: BP elevated today. Will continue Lisinopril and Toprol 50 mg BID and increase Norvasc to 10 mg daily.      3. Hyperlipidemia: He is on a statin. Lipids well controlled.   4. Chronic diastolic CHF/Dyspnea: Resolved on Lasix.    Current medicines are reviewed at length with the patient today.  The patient does not have concerns regarding medicines.  The following changes have been made:  no change  Labs/ tests ordered today include:   No orders of the defined types were placed in this encounter.   Disposition:   F/U with me in 6 months  Signed, Lauree Chandler, MD 04/17/2016 9:54 AM    [facility] [place], [place], [place]  [address] Phone: [phone]; Fax: [phone]

## 2016-04-17 ENCOUNTER — Encounter: Payer: Self-pay | Admitting: Cardiovascular Disease

## 2016-04-17 ENCOUNTER — Ambulatory Visit (INDEPENDENT_AMBULATORY_CARE_PROVIDER_SITE_OTHER): Payer: Medicare Other | Admitting: Cardiovascular Disease

## 2016-04-17 VITALS — BP 170/84 | HR 65 | Ht 70.0 in | Wt 256.6 lb

## 2016-04-17 DIAGNOSIS — I10 Essential (primary) hypertension: Secondary | ICD-10-CM | POA: Diagnosis not present

## 2016-04-17 DIAGNOSIS — R0600 Dyspnea, unspecified: Secondary | ICD-10-CM | POA: Diagnosis not present

## 2016-04-17 DIAGNOSIS — I2510 Atherosclerotic heart disease of native coronary artery without angina pectoris: Secondary | ICD-10-CM | POA: Diagnosis not present

## 2016-04-17 DIAGNOSIS — I5032 Chronic diastolic (congestive) heart failure: Secondary | ICD-10-CM

## 2016-04-17 DIAGNOSIS — E7800 Pure hypercholesterolemia, unspecified: Secondary | ICD-10-CM

## 2016-04-17 MED ORDER — CLOPIDOGREL BISULFATE 75 MG PO TABS: 75.0000 mg | ORAL_TABLET | Freq: Every day | ORAL | 3 refills | Status: DC

## 2016-04-17 MED ORDER — FUROSEMIDE 20 MG PO TABS: 20.0000 mg | ORAL_TABLET | Freq: Every day | ORAL | 3 refills | Status: DC

## 2016-04-17 MED ORDER — LISINOPRIL 40 MG PO TABS: 40.0000 mg | ORAL_TABLET | Freq: Every day | ORAL | 3 refills | Status: DC

## 2016-04-17 MED ORDER — METOPROLOL SUCCINATE ER 50 MG PO TB24: 50.0000 mg | ORAL_TABLET | Freq: Two times a day (BID) | ORAL | 3 refills | Status: DC

## 2016-04-17 MED ORDER — AMLODIPINE BESYLATE 10 MG PO TABS: 10.0000 mg | ORAL_TABLET | Freq: Every day | ORAL | 3 refills | Status: DC

## 2016-04-17 NOTE — Patient Instructions (Signed)
Medication Instructions:  Your physician has recommended you make the following change in your medication:  Increase amlodipine to 10 mg by mouth daily.    Labwork: none  Testing/Procedures: none  Follow-Up: Your physician recommends that you schedule a follow-up appointment in: 6 months. Please call our office in about 3 months to schedule this appointment.     Any Other Special Instructions Will Be Listed Below (If Applicable).     If you need a refill on your cardiac medications before your next appointment, please call your pharmacy.

## 2016-04-17 NOTE — Addendum Note (Signed)
Addended by: Thompson Grayer on: 04/17/2016 10:09 AM   Modules accepted: Orders

## 2016-06-23 ENCOUNTER — Encounter: Payer: Self-pay | Admitting: Family Medicine

## 2016-06-25 ENCOUNTER — Telehealth: Payer: Self-pay

## 2016-06-25 NOTE — Telephone Encounter (Signed)
Joel Williams scheduled for AWV 04/20 with Manuela Schwartz Call to confirm he has been on Medicare Part B for over one year, as he may have started part B this year with A only in 2017.   Will confirm as I cannot do the AWV if this is his first year on Part B.

## 2016-06-25 NOTE — Telephone Encounter (Signed)
Call rec'd from Mr. Joel Williams Confirmed this is his first year on Medicare part B which started in January of this year.  He was on Medicare A only last year.  Explained that he should have his "Welcome to medicare visit" with Dr. Elease Hashimoto and the I will do the 2nd year and subsequent years.  Cc to [facility];  Please confirm.  Landis Martins

## 2016-06-26 NOTE — Telephone Encounter (Signed)
Hi Manuela Schwartz,  I looked for updated Medicare card, but do not see one in chart. He had part A since  2016, but I do not see when he became effective with part B.  If this is his first year of Medicare as he says, then yes, he should have his Welcome to Medicare with Provider.  Thanks, [employer]

## 2016-07-01 NOTE — Telephone Encounter (Signed)
Call to Joel Williams;  LVM that we do need to reschedule his AWV to Dr. Elease Hashimoto so he can completed the Welcome to Medicare Assessment, which is [phone] that only he can complete.  If he calls back, please cancel his AWV with Manuela Schwartz on Friday at 8am and reschedule for AWV with Dr. Elease Hashimoto; note this is a [phone].

## 2016-07-03 ENCOUNTER — Ambulatory Visit (INDEPENDENT_AMBULATORY_CARE_PROVIDER_SITE_OTHER): Payer: Medicare Other | Admitting: Family Medicine

## 2016-07-03 ENCOUNTER — Encounter: Payer: Self-pay | Admitting: Family Medicine

## 2016-07-03 ENCOUNTER — Ambulatory Visit: Payer: Medicare Other

## 2016-07-03 VITALS — BP 140/80 | HR 74 | Temp 97.5°F | Ht 69.5 in | Wt 253.3 lb

## 2016-07-03 DIAGNOSIS — Z0000 Encounter for general adult medical examination without abnormal findings: Secondary | ICD-10-CM

## 2016-07-03 MED ORDER — SIMVASTATIN 20 MG PO TABS: 20.0000 mg | ORAL_TABLET | Freq: Every day | ORAL | 3 refills | Status: DC

## 2016-07-03 NOTE — Patient Instructions (Signed)
We will set up low dose CT lung cancer screen. Consider abdominal aortic aneurysm screen at some point. Set up repeat eye exam.

## 2016-07-03 NOTE — Progress Notes (Signed)
Pre visit review using our clinic review tool, if applicable. No additional management support is needed unless otherwise documented below in the visit note. 

## 2016-07-03 NOTE — Progress Notes (Signed)
Subjective:     Patient ID: Joel Williams, male   DOB: 12-08-1950, 66 y.o.   MRN: [id number]  HPI Patient is here for Welcome to Medicare initial exam  PMH significant for :  Obesity, hypertension, Type 2 diabetes, CAD, hyperlipidemia. Medications reviewed- on Amlodipine, asa, Plavix, Lasix, Insulin pump, Jardiance ,Lisinopril,Metformin,Metoprolol,Zocor, and Cialis,    FH as below with no changes. Social- retired.  Quit smoking 2004 with about 35-40 pack-yr hx.  Occasional ETOH. Diet- tries to follow low CHO diet. Exercise- none formally.  Other Providers:   -Cardiology- Dr [name] -Endocrinology- Dr Buddy Duty -Podiatry-Dr Earleen Newport.  Depression screen:  PHQ-2=0. ADLs- independent in all Fall risk=low Hearing- chronic deficit followed by audiology Home safety- no concerns  Visual acuity.  He sees ophthalmology and needs to set up follow up now for dilated eye exam End of Life planning- has living will and durable medical power of attorney.  EKG- no indications.  Has recently per cardiology. Other screenings discussed- Hep C, AAA screen, low dose CT lung cancer.  Past Medical History:  Diagnosis Date  . ASTHMA 06/08/2008  . CAD (coronary artery disease)    a. s/p Promus DES (3.0 x 16 mm) to mid LAD 05/16/10;  b. Myoview 05/15/10: anteroseptal ischemia with EF 52%;   c. Cath 05/16/10: single vessel CAD with mLAD 90% tx with PCI and EF 50%;  d. 07/2011 Cath:  Patent stent, nonobs dzs, NL EF.  Marland Kitchen Chronic kidney disease   . Colonic polyp   . DIABETES MELLITUS, TYPE II 06/08/2008  . GERD (gastroesophageal reflux disease)   . Hemorrhoids   . HYPERLIPIDEMIA 06/08/2008  . HYPERTENSION 06/08/2008  . Impotence of organic origin 08/24/2008  . Myocardial infarction ([place]) 04/2010  . Pneumonia    hx of   Past Surgical History:  Procedure Laterality Date  . CORONARY ANGIOPLASTY WITH STENT PLACEMENT    . LEFT HEART CATHETERIZATION WITH CORONARY ANGIOGRAM N/A 07/29/2011   Procedure: LEFT HEART  CATHETERIZATION WITH CORONARY ANGIOGRAM;  Surgeon: Burnell Blanks, MD;  Location: [facility] CATH LAB;  Service: Cardiovascular;  Laterality: N/A;  . LEFT HEART CATHETERIZATION WITH CORONARY ANGIOGRAM N/A 06/14/2013   Procedure: LEFT HEART CATHETERIZATION WITH CORONARY ANGIOGRAM;  Surgeon: Burnell Blanks, MD;  Location: [facility] CATH LAB;  Service: Cardiovascular;  Laterality: N/A;    reports that he quit smoking about 14 years ago. His smoking use included Cigarettes. He has a 35.00 pack-year smoking history. He has never used smokeless tobacco. He reports that he drinks about 8.4 oz of alcohol per week . He reports that he does not use drugs. family history includes Angina in his father; Dementia in his mother; Diabetes in his father; Heart disease in his father. No Known Allergies      Review of Systems  Constitutional: Negative for activity change, appetite change, fatigue and fever.  HENT: Negative for congestion, ear pain and trouble swallowing.   Eyes: Negative for pain and visual disturbance.  Respiratory: Negative for cough, shortness of breath and wheezing.   Cardiovascular: Negative for chest pain and palpitations.  Gastrointestinal: Negative for abdominal distention, abdominal pain, blood in stool, constipation, diarrhea, nausea, rectal pain and vomiting.  Genitourinary: Negative for dysuria, hematuria and testicular pain.  Musculoskeletal: Negative for arthralgias and joint swelling.  Skin: Negative for rash.  Neurological: Negative for dizziness, syncope and headaches.  Hematological: Negative for adenopathy.  Psychiatric/Behavioral: Negative for confusion and dysphoric mood.       Objective:   Physical Exam  Constitutional: He is oriented to person, place, and time. He appears well-developed and well-nourished. No distress.  HENT:  Head: Normocephalic and atraumatic.  Right Ear: External ear normal.  Left Ear: External ear normal.  Mouth/Throat: Oropharynx is clear  and moist.  Eyes: Conjunctivae and EOM are normal. Pupils are equal, round, and reactive to light.  Neck: Normal range of motion. Neck supple. No thyromegaly present.  Cardiovascular: Normal rate, regular rhythm and normal heart sounds.   No murmur heard. Pulmonary/Chest: No respiratory distress. He has no wheezes. He has no rales.  Abdominal: Soft. Bowel sounds are normal. He exhibits no distension and no mass. There is no tenderness. There is no rebound and no guarding.  Musculoskeletal: He exhibits no edema.  Lymphadenopathy:    He has no cervical adenopathy.  Neurological: He is alert and oriented to person, place, and time. He displays normal reflexes. No cranial nerve deficit.  Skin: No rash noted.  Psychiatric: He has a normal mood and affect.       Assessment:     Welcome to Medicare visit.    Plan:     -discussed getting Hep C antibody - discussed AAA screen and he wishes to wait - discussed low dose CT lung cancer screen and he wishes to proceed. -refilled Simvastatin -The natural history of prostate cancer and ongoing controversy regarding screening and potential treatment outcomes of prostate cancer has been discussed with the patient. The meaning of a false positive PSA and a false negative PSA has been discussed. He indicates understanding of the limitations of this screening test and wishes not to proceed with screening PSA testing.  [name] MD [facility] at [facility]

## 2016-07-04 LAB — HEPATITIS C ANTIBODY: HCV AB: NEGATIVE

## 2016-07-05 ENCOUNTER — Encounter: Payer: Self-pay | Admitting: Family Medicine

## 2016-08-03 ENCOUNTER — Other Ambulatory Visit: Payer: Self-pay | Admitting: Acute Care

## 2016-08-03 DIAGNOSIS — Z87891 Personal history of nicotine dependence: Secondary | ICD-10-CM

## 2016-08-19 ENCOUNTER — Encounter: Payer: Self-pay | Admitting: Acute Care

## 2016-08-19 ENCOUNTER — Ambulatory Visit (INDEPENDENT_AMBULATORY_CARE_PROVIDER_SITE_OTHER)
Admission: RE | Admit: 2016-08-19 | Discharge: 2016-08-19 | Disposition: A | Discharge status: Home / Self Care | Payer: Medicare Other | Source: Ambulatory Visit | Attending: Acute Care | Admitting: Acute Care

## 2016-08-19 ENCOUNTER — Ambulatory Visit (INDEPENDENT_AMBULATORY_CARE_PROVIDER_SITE_OTHER): Payer: Medicare Other | Admitting: Acute Care

## 2016-08-19 DIAGNOSIS — Z87891 Personal history of nicotine dependence: Secondary | ICD-10-CM

## 2016-08-19 NOTE — Progress Notes (Signed)
Shared Decision Making Visit Lung Cancer Screening Program [phone])   Eligibility:  Age 66 y.o.  Pack Years Smoking History Calculation 76-pack-year smoking history (# packs/per year x # years smoked)  Recent History of coughing up blood  no  Unexplained weight loss? no ( >Than 15 pounds within the last 6 months )  Prior History Lung / other cancer no (Diagnosis within the last 5 years already requiring surveillance chest CT Scans).  Smoking Status Former Smoker  Former Smokers: Years since quit: 14 years  Quit Date: 2004  Visit Components:  Discussion included one or more decision making aids. yes  Discussion included risk/benefits of screening. yes  Discussion included potential follow up diagnostic testing for abnormal scans. yes  Discussion included meaning and risk of over diagnosis. yes  Discussion included meaning and risk of False Positives. yes  Discussion included meaning of total radiation exposure. yes  Counseling Included:  Importance of adherence to annual lung cancer LDCT screening. yes  Impact of comorbidities on ability to participate in the program. yes  Ability and willingness to under diagnostic treatment. yes  Smoking Cessation Counseling:  Current Smokers:   Discussed importance of smoking cessation. NA  Information about tobacco cessation classes and interventions provided to patient. yes  Patient provided with "ticket" for LDCT Scan. yes  Symptomatic Patient. no  Counseling  Diagnosis Code: Tobacco Use Z72.0  Asymptomatic Patient yes  Counseling (Intermediate counseling: > three minutes counseling) H4742  Former Smokers:   Discussed the importance of maintaining cigarette abstinence. yes  Diagnosis Code: Personal History of Nicotine Dependence. V95.638  Information about tobacco cessation classes and interventions provided to patient. Yes  Patient provided with "ticket" for LDCT Scan. yes  Written Order for Lung Cancer  Screening with LDCT placed in Epic. Yes (CT Chest Lung Cancer Screening Low Dose W/O CM) [id number] Z12.2-Screening of respiratory organs Z87.891-Personal history of nicotine dependence  I spent 25 minutes of face to face time with Mr. Riebel discussing the risks and benefits of lung cancer screening. We viewed a power point together that explained in detail the above noted topics. We took the time to pause the power point at intervals to allow for questions to be asked and answered to ensure understanding. We discussed that he had taken the single most powerful action possible to decrease his risk of developing lung cancer when he quit smoking. I counseled him to remain smoke free, and to contact me if he ever had the desire to smoke again so that I can provide resources and tools to help support the effort to remain smoke free. We discussed the time and location of the scan, and that either  Doroteo Glassman RN or I will call with the results within  24-48 hours of receiving them. Mr. Polivka has my card and contact information in the event he needs to speak with me, in addition to a copy of the power point we reviewed as a resource. Mr. Pask verbalized understanding of all of the above and had no further questions upon leaving the office.     I explained to the patient that there has been a high incidence of coronary artery disease noted on these exams. I explained that this is a non-gated exam therefore degree or severity cannot be determined. This patient is currently on statin therapy. I have asked the patient to follow-up with their PCP regarding any incidental finding of coronary artery disease and management with diet or medication as they feel is clinically  indicated. The patient verbalized understanding of the above and had no further questions.   I have spent 3 minutes counseling patient on continued smoking cessation this visit. I congratulated him on his 14 years of liberation from  cigarettes  Magdalen Spatz, NP 08/19/2016

## 2016-08-20 ENCOUNTER — Other Ambulatory Visit: Payer: Self-pay | Admitting: Acute Care

## 2016-08-20 DIAGNOSIS — Z87891 Personal history of nicotine dependence: Secondary | ICD-10-CM

## 2016-09-07 ENCOUNTER — Encounter: Payer: Self-pay | Admitting: Family Medicine

## 2016-09-24 DIAGNOSIS — E291 Testicular hypofunction: Secondary | ICD-10-CM | POA: Diagnosis not present

## 2016-09-24 DIAGNOSIS — Z125 Encounter for screening for malignant neoplasm of prostate: Secondary | ICD-10-CM | POA: Diagnosis not present

## 2016-09-24 DIAGNOSIS — E1142 Type 2 diabetes mellitus with diabetic polyneuropathy: Secondary | ICD-10-CM | POA: Diagnosis not present

## 2016-09-24 DIAGNOSIS — Z794 Long term (current) use of insulin: Secondary | ICD-10-CM | POA: Diagnosis not present

## 2016-09-24 DIAGNOSIS — E1165 Type 2 diabetes mellitus with hyperglycemia: Secondary | ICD-10-CM | POA: Diagnosis not present

## 2016-09-24 DIAGNOSIS — Z5181 Encounter for therapeutic drug level monitoring: Secondary | ICD-10-CM | POA: Diagnosis not present

## 2016-09-24 DIAGNOSIS — I2510 Atherosclerotic heart disease of native coronary artery without angina pectoris: Secondary | ICD-10-CM | POA: Diagnosis not present

## 2016-09-25 DIAGNOSIS — Z794 Long term (current) use of insulin: Secondary | ICD-10-CM | POA: Diagnosis not present

## 2016-09-25 DIAGNOSIS — Z79899 Other long term (current) drug therapy: Secondary | ICD-10-CM | POA: Diagnosis not present

## 2016-09-25 DIAGNOSIS — Z125 Encounter for screening for malignant neoplasm of prostate: Secondary | ICD-10-CM | POA: Diagnosis not present

## 2016-09-25 DIAGNOSIS — Z7984 Long term (current) use of oral hypoglycemic drugs: Secondary | ICD-10-CM | POA: Diagnosis not present

## 2016-09-25 DIAGNOSIS — E291 Testicular hypofunction: Secondary | ICD-10-CM | POA: Diagnosis not present

## 2016-09-25 DIAGNOSIS — E1142 Type 2 diabetes mellitus with diabetic polyneuropathy: Secondary | ICD-10-CM | POA: Diagnosis not present

## 2016-09-25 LAB — BASIC METABOLIC PANEL
BUN: 17 (ref 4–21)
Creatinine: 0.8 (ref 0.6–1.3)
GLUCOSE: 243
Potassium: 4.5 (ref 3.4–5.3)
SODIUM: 140 (ref 137–147)

## 2016-09-25 LAB — HEPATIC FUNCTION PANEL
ALK PHOS: 47 (ref 25–125)
ALT: 46 — AB (ref 10–40)
AST: 36 (ref 14–40)
Bilirubin, Total: 0.7

## 2016-09-25 LAB — MICROALBUMIN, URINE: Microalb, Ur: 7.66

## 2016-09-25 LAB — CBC AND DIFFERENTIAL
HEMATOCRIT: 41 (ref 41–53)
HEMOGLOBIN: 14.3 (ref 13.5–17.5)
PLATELETS: 137 — AB (ref 150–399)
WBC: 3.7

## 2016-09-25 LAB — VITAMIN B12: VITAMIN B 12: 250

## 2016-09-25 LAB — PSA: PSA: 2.24

## 2016-09-25 LAB — HEMOGLOBIN A1C: Hemoglobin A1C: 10

## 2016-10-01 ENCOUNTER — Encounter: Payer: Self-pay | Admitting: Family Medicine

## 2016-12-03 ENCOUNTER — Encounter: Payer: Self-pay | Admitting: Family Medicine

## 2016-12-13 DIAGNOSIS — Z23 Encounter for immunization: Secondary | ICD-10-CM | POA: Diagnosis not present

## 2016-12-21 DIAGNOSIS — J111 Influenza due to unidentified influenza virus with other respiratory manifestations: Secondary | ICD-10-CM | POA: Diagnosis not present

## 2016-12-28 DIAGNOSIS — Z5181 Encounter for therapeutic drug level monitoring: Secondary | ICD-10-CM | POA: Diagnosis not present

## 2016-12-28 DIAGNOSIS — I2510 Atherosclerotic heart disease of native coronary artery without angina pectoris: Secondary | ICD-10-CM | POA: Diagnosis not present

## 2016-12-28 DIAGNOSIS — E1165 Type 2 diabetes mellitus with hyperglycemia: Secondary | ICD-10-CM | POA: Diagnosis not present

## 2016-12-28 DIAGNOSIS — Z794 Long term (current) use of insulin: Secondary | ICD-10-CM | POA: Diagnosis not present

## 2016-12-28 DIAGNOSIS — E1142 Type 2 diabetes mellitus with diabetic polyneuropathy: Secondary | ICD-10-CM | POA: Diagnosis not present

## 2016-12-28 DIAGNOSIS — E291 Testicular hypofunction: Secondary | ICD-10-CM | POA: Diagnosis not present

## 2017-04-06 DIAGNOSIS — E291 Testicular hypofunction: Secondary | ICD-10-CM | POA: Diagnosis not present

## 2017-04-06 DIAGNOSIS — E1165 Type 2 diabetes mellitus with hyperglycemia: Secondary | ICD-10-CM | POA: Diagnosis not present

## 2017-04-06 DIAGNOSIS — Z5181 Encounter for therapeutic drug level monitoring: Secondary | ICD-10-CM | POA: Diagnosis not present

## 2017-04-06 DIAGNOSIS — Z794 Long term (current) use of insulin: Secondary | ICD-10-CM | POA: Diagnosis not present

## 2017-04-06 DIAGNOSIS — I2510 Atherosclerotic heart disease of native coronary artery without angina pectoris: Secondary | ICD-10-CM | POA: Diagnosis not present

## 2017-04-06 DIAGNOSIS — E1142 Type 2 diabetes mellitus with diabetic polyneuropathy: Secondary | ICD-10-CM | POA: Diagnosis not present

## 2017-04-12 ENCOUNTER — Other Ambulatory Visit: Payer: Self-pay | Admitting: Cardiovascular Disease

## 2017-04-12 DIAGNOSIS — I2510 Atherosclerotic heart disease of native coronary artery without angina pectoris: Secondary | ICD-10-CM

## 2017-04-12 MED ORDER — METOPROLOL SUCCINATE ER 50 MG PO TB24: ORAL_TABLET | ORAL | 3 refills | Status: DC

## 2017-04-12 MED ORDER — CLOPIDOGREL BISULFATE 75 MG PO TABS: 75.0000 mg | ORAL_TABLET | Freq: Every day | ORAL | 0 refills | Status: DC

## 2017-04-12 MED ORDER — AMLODIPINE BESYLATE 10 MG PO TABS: 10.0000 mg | ORAL_TABLET | Freq: Every day | ORAL | 0 refills | Status: DC

## 2017-04-12 MED ORDER — LISINOPRIL 40 MG PO TABS: 40.0000 mg | ORAL_TABLET | Freq: Every day | ORAL | 0 refills | Status: DC

## 2017-04-12 NOTE — Addendum Note (Signed)
Addended by: Derl Barrow on: 04/12/2017 09:11 AM   Modules accepted: Orders

## 2017-04-14 ENCOUNTER — Other Ambulatory Visit: Payer: Self-pay | Admitting: Cardiovascular Disease

## 2017-04-14 DIAGNOSIS — I2510 Atherosclerotic heart disease of native coronary artery without angina pectoris: Secondary | ICD-10-CM

## 2017-04-16 LAB — HM DIABETES EYE EXAM

## 2017-04-19 DIAGNOSIS — R5383 Other fatigue: Secondary | ICD-10-CM | POA: Diagnosis not present

## 2017-04-19 DIAGNOSIS — R05 Cough: Secondary | ICD-10-CM | POA: Diagnosis not present

## 2017-04-19 DIAGNOSIS — J0100 Acute maxillary sinusitis, unspecified: Secondary | ICD-10-CM | POA: Diagnosis not present

## 2017-04-19 DIAGNOSIS — H66001 Acute suppurative otitis media without spontaneous rupture of ear drum, right ear: Secondary | ICD-10-CM | POA: Diagnosis not present

## 2017-05-11 ENCOUNTER — Other Ambulatory Visit: Payer: Self-pay | Admitting: Cardiovascular Disease

## 2017-05-11 DIAGNOSIS — I2510 Atherosclerotic heart disease of native coronary artery without angina pectoris: Secondary | ICD-10-CM

## 2017-05-17 DIAGNOSIS — H52223 Regular astigmatism, bilateral: Secondary | ICD-10-CM | POA: Diagnosis not present

## 2017-05-17 DIAGNOSIS — E11319 Type 2 diabetes mellitus with unspecified diabetic retinopathy without macular edema: Secondary | ICD-10-CM | POA: Diagnosis not present

## 2017-05-17 DIAGNOSIS — E119 Type 2 diabetes mellitus without complications: Secondary | ICD-10-CM | POA: Diagnosis not present

## 2017-05-17 DIAGNOSIS — H5213 Myopia, bilateral: Secondary | ICD-10-CM | POA: Diagnosis not present

## 2017-05-26 ENCOUNTER — Other Ambulatory Visit: Payer: Self-pay | Admitting: Cardiovascular Disease

## 2017-05-26 DIAGNOSIS — I2510 Atherosclerotic heart disease of native coronary artery without angina pectoris: Secondary | ICD-10-CM

## 2017-06-17 ENCOUNTER — Other Ambulatory Visit: Payer: Self-pay | Admitting: Cardiovascular Disease

## 2017-06-17 DIAGNOSIS — I2510 Atherosclerotic heart disease of native coronary artery without angina pectoris: Secondary | ICD-10-CM

## 2017-06-22 ENCOUNTER — Other Ambulatory Visit: Payer: Self-pay | Admitting: Family Medicine

## 2017-07-04 NOTE — Progress Notes (Addendum)
Subjective:   Joel Williams is a 67 y.o. male who presents for an Initial Medicare Annual Wellness Visit.  Welcome to Medicare on 06/2016 MI 2012; DM; CAD  Dr. Buddy Duty endo Labs due in July and were abnormal Agreed to apt with Dr. 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 Buddy Duty A1c was 9.9 but next visit is early May Increased insulin so feels like it will be better  Still on an insulin pump, was doing a bolus of 10 to 12 and now increased to 20 and sent meter reading to Dr. Buddy Duty and now up to 25.   Breakfast; sausage egg and cheese Macmuffin Or something similar at home Lunch a sandwich - at a fast food generally Supper - beef with vegetables or they go out to dinner  Diabetes dx in 2011  Patient in on the pump and feels he can get control of it  Has lost weight in the past (45lbs)    Exercise Yard work of 2 acres 1/2 mile to a mile a day  20 minutes    BMI 36   Cardiac Risk Factors include: advanced age (>19men, >53 women);diabetes mellitus;dyslipidemia;family history of premature cardiovascular disease;hypertension;male gender;obesity (BMI >30kg/m2)Tobacco use; 53 pack year being followed by Eric Form Has no desire to smoke   This patient has not  Been seen since 07/03/2016  There are no preventive care reminders to display for this patient. Foot exam 09/2016 per Dr. Buddy Duty  Eye exam 2 months ago, dr. Marica Otter Rx did not need to be changed No diabetic retinopathy     Will see Dr. [name]  Triglycerides 250      Objective:    Today's Vitals   07/06/17 0810  BP: 136/70  Pulse: 64  SpO2: 96%  Weight: 252 lb (114.3 kg)  Height: 5\' 10"  (1.778 m)   Body mass index is 36.16 kg/m.  Advanced Directives 07/06/2017 04/09/2014 06/14/2013 07/29/2011  Does Patient Have a Medical Advance Directive? Yes No Patient has advance directive, copy  not in chart Patient has advance directive, copy not in chart  Type of Advance Directive - - Living will;Healthcare Power of [place];Living will  Copy of [place] in Chart? - - Copy requested from family Copy requested from family  Would patient like information on creating a medical advance directive? - No - patient declined information - -    Current Medications (verified) Outpatient Encounter Medications as of 07/06/2017  Medication Sig  . amLODipine (NORVASC) 10 MG tablet Take 1 tablet by mouth daily. Patient overdue for an appointment and needs to call and schedule for further refills 3rd/final attempt  . Ascorbic Acid (VITAMIN C) 1000 MG tablet Take 1,000 mg by mouth 2 (two) times daily.   Marland Kitchen aspirin 81 MG tablet Take 1 tablet (81 mg total) by mouth daily.  . Biotin 1000 MCG tablet Take 1,000 mcg by mouth every evening.   . Cholecalciferol 1000 UNITS TBDP Take 1,000 Units by mouth daily.  . clopidogrel (PLAVIX) 75 MG tablet Take 1 tablet by mouth daily. Patient overdue for an appointment and needs to call and schedule for further refills 3rd/final attempt  . fluticasone (FLONASE) 50 MCG/ACT nasal spray Place 1 spray into both nostrils daily.  Marland Kitchen glucagon (GLUCAGON EMERGENCY) 1 MG injection Inject 1 mg into  the vein once as needed (Low blood sugar).   . Insulin Human (INSULIN PUMP) 100 unit/ml SOLN Inject into the skin continuous. Humalog; basal rate: 50 - 85 units per day.  Marland Kitchen JARDIANCE 10 MG TABS tablet Take 10 mg by mouth daily.  Marland Kitchen lisinopril (PRINIVIL,ZESTRIL) 40 MG tablet Take 1 tablet (40 mg total) by mouth daily. Please make yearly appt with Dr. [name] for February. 1st attempt  . metFORMIN (GLUCOPHAGE) 500 MG tablet Take 1,000 mg by mouth 2 (two) times daily with a meal.  . metoprolol succinate (TOPROL-XL) 50 MG 24 hr tablet Take 1 tablet by mouth twice daily.Take with or immediately following a meal. Please make appt with Dr. [name]  for February. 1st attempt  . mometasone (NASONEX) 50 MCG/ACT nasal spray Place 1 spray into the nose 2 (two) times daily as needed (allergies).  . Multiple Vitamins-Minerals (ICAPS MV PO) Take 1 capsule by mouth every evening.  . nitroGLYCERIN (NITROSTAT) 0.4 MG SL tablet Place 0.4 mg under the tongue every 5 (five) minutes as needed for chest pain.   . Omega-3 Fatty Acids (FISH OIL) 1200 MG CAPS Take 1,200 mg by mouth daily.  . simvastatin (ZOCOR) 20 MG tablet TAKE 1 TABLET (20 MG TOTAL) BY MOUTH DAILY AT 6 PM.  . tadalafil (CIALIS) 20 MG tablet Take 20 mg by mouth every other day as needed for erectile dysfunction.  . Testosterone (ANDROGEL) 20.25 MG/1.25GM (1.62%) GEL Place onto the skin. Apply 2 pumps in the morning and 2 pumps in the evening  . vitamin E 400 UNIT capsule Take 400 Units by mouth every evening.  . furosemide (LASIX) 20 MG tablet Take 1 tablet (20 mg total) by mouth daily.   No facility-administered encounter medications on file as of 07/06/2017.     Allergies (verified) Patient has no known allergies.   History: Past Medical History:  Diagnosis Date  . ASTHMA 06/08/2008  . CAD (coronary artery disease)    a. s/p Promus DES (3.0 x 16 mm) to mid LAD 05/16/10;  b. Myoview 05/15/10: anteroseptal ischemia with EF 52%;   c. Cath 05/16/10: single vessel CAD with mLAD 90% tx with PCI and EF 50%;  d. 07/2011 Cath:  Patent stent, nonobs dzs, NL EF.  Marland Kitchen Chronic kidney disease   . Colonic polyp   . DIABETES MELLITUS, TYPE II 06/08/2008  . GERD (gastroesophageal reflux disease)   . Hemorrhoids   . HYPERLIPIDEMIA 06/08/2008  . HYPERTENSION 06/08/2008  . Impotence of organic origin 08/24/2008  . Myocardial infarction ([place]) 04/2010  . Pneumonia    hx of   Past Surgical History:  Procedure Laterality Date  . CORONARY ANGIOPLASTY WITH STENT PLACEMENT    . LEFT HEART CATHETERIZATION WITH CORONARY ANGIOGRAM N/A 07/29/2011   Procedure: LEFT HEART CATHETERIZATION WITH CORONARY ANGIOGRAM;   Surgeon: Burnell Blanks, MD;  Location: [facility] CATH LAB;  Service: Cardiovascular;  Laterality: N/A;  . LEFT HEART CATHETERIZATION WITH CORONARY ANGIOGRAM N/A 06/14/2013   Procedure: LEFT HEART CATHETERIZATION WITH CORONARY ANGIOGRAM;  Surgeon: Burnell Blanks, MD;  Location: [facility] CATH LAB;  Service: Cardiovascular;  Laterality: N/A;   Family History  Problem Relation Age of Onset  . Diabetes Father   . Heart disease Father   . Angina Father   . Dementia Mother    Social History   Socioeconomic History  . Marital status: Married    Spouse name: Not on file  . Number of children: 2  . Years of education: Not on  file  . Highest education level: Not on file  Occupational History  . Occupation: Press photographer  Social Needs  . Financial resource strain: Not on file  . Food insecurity:    Worry: Not on file    Inability: Not on file  . Transportation needs:    Medical: Not on file    Non-medical: Not on file  Tobacco Use  . Smoking status: Former Smoker    Packs/day: 2.00    Years: 53.00    Pack years: 106.00    Types: Cigarettes    Last attempt to quit: 05/23/2002    Years since quitting: 15.1  . Smokeless tobacco: Never Used  Substance and Sexual Activity  . Alcohol use: Yes    Alcohol/week: 8.4 oz    Types: 14 Shots of liquor per week    Comment: one drink a day  . Drug use: No  . Sexual activity: Not Currently  Lifestyle  . Physical activity:    Days per week: Not on file    Minutes per session: Not on file  . Stress: Not on file  Relationships  . Social connections:    Talks on phone: Not on file    Gets together: Not on file    Attends religious service: Not on file    Active member of club or organization: Not on file    Attends meetings of clubs or organizations: Not on file    Relationship status: Not on file  Other Topics Concern  . Not on file  Social History Narrative  . Not on file   Tobacco Counseling Counseling given: Not Answered   Clinical  Intake:    Activities of Daily Living In your present state of health, do you have any difficulty performing the following activities: 07/06/2017  Hearing? Y  Comment not problematic   Vision? Y  Difficulty concentrating or making decisions? N  Walking or climbing stairs? N  Dressing or bathing? N  Doing errands, shopping? N  Preparing Food and eating ? N  Using the Toilet? N  In the past six months, have you accidently leaked urine? N  Do you have problems with loss of bowel control? N  Managing your Medications? N  Managing your Finances? N  Housekeeping or managing your Housekeeping? N  Some recent data might be hidden     Immunizations and Health Maintenance Immunization History  Administered Date(s) Administered  . Influenza Split 12/28/2011, 12/26/2012  . Influenza-Unspecified 12/31/2015  . Pneumococcal Conjugate-13 06/29/2014  . Pneumococcal Polysaccharide-23 03/16/2005, 07/05/2015  . Td 03/16/2005  . Tdap 07/05/2015  . Zoster 08/06/2010   There are no preventive care reminders to display for this patient.  Patient Care Team: [name], MD as PCP - Sabra Heck, MD as Attending Physician (Endocrinology)  Indicate any recent Medical Services you may have received from other than [facility] providers in the past year (date may be approximate).    Assessment:   This is a routine wellness examination for [place].  Hearing/Vision screen Hearing Screening Comments: No hearing issues that are problematic Dx with tinnitus  Vision Screening Comments: Dr. Marica Otter   Dietary issues and exercise activities discussed: Current Exercise Habits: Home exercise routine, Type of exercise: stretching;strength training/weights(yard work)  Goals    . Weight (lb) < 235 lb (106.6 kg)     May try to start doing some of the things that made you successful in the past Triggers that make you slide off the wagon., becomes  to difficult  May consider grass fed beef  You  can grill vegetables; (flat top grill )  Can pick better options at [facility] place, you can eat a garden salad with barbecue salad       Depression Screen PHQ 2/9 Scores 07/06/2017 07/03/2016 07/05/2015 06/29/2014  PHQ - 2 Score 0 0 0 0    Fall Risk Fall Risk  07/06/2017 07/03/2016 07/05/2015 06/29/2014  Falls in the past year? No No No No   No falls   Cognitive Function: Mother had dementia Ad8 score reviewed for issues:  Issues making decisions:  Less interest in hobbies / activities:  Repeats questions, stories (family complaining):  Trouble using ordinary gadgets (microwave, computer, phone):  Forgets the month or year:   Mismanaging finances:   Remembering appts:  Daily problems with thinking and/or memory: Ad8 score is=0      Screening Tests Health Maintenance  Topic Date Due  . HEMOGLOBIN A1C  07/15/2017 (Originally 03/28/2017)  . FOOT EXAM  10/13/2017 (Originally 05/10/1960)  . INFLUENZA VACCINE  10/14/2017  . OPHTHALMOLOGY EXAM  05/13/2018  . COLONOSCOPY  04/10/2019  . TETANUS/TDAP  07/04/2025  . Hepatitis C Screening  Completed  . PNA vac Low Risk Adult  Completed           Plan:      PCP Notes  Health Maintenance Educated regarding the shingrix Has had eye exam x 2 months ago Foot exam completed July of last year per Dr. Buddy Duty  Dr. Buddy Duty managing diabetes Coached regarding lifestyle changes and exercise   Abnormal Screens  BS are better per his report   Referrals  Will see Dr. Buddy Duty early May  Patient concerns; Keeping DM in control   Nurse Concerns; Coached regarding diet   Next PCP apt Will make an apt with Dr. Elease Hashimoto when you leave  Will make an AWV apt when leaving       I have personally reviewed and noted the following in the patient's chart:   . Medical and social history . Use of alcohol, tobacco or illicit drugs  . Current medications and supplements . Functional ability and status . Nutritional  status . Physical activity . Advanced directives . List of other physicians . Hospitalizations, surgeries, and ER visits in previous 12 months . Vitals . Screenings to include cognitive, depression, and falls . Referrals and appointments  In addition, I have reviewed and discussed with patient certain preventive protocols, quality metrics, and best practice recommendations. A written personalized care plan for preventive services as well as general preventive health recommendations were provided to patient.     [email or web address],[email or web address], RN   07/06/2017   Agree with assessment as above.  [name] MD [facility] at [facility]

## 2017-07-06 ENCOUNTER — Ambulatory Visit (INDEPENDENT_AMBULATORY_CARE_PROVIDER_SITE_OTHER): Payer: Medicare Other

## 2017-07-06 VITALS — BP 136/70 | HR 64 | Ht 70.0 in | Wt 252.0 lb

## 2017-07-06 DIAGNOSIS — Z0000 Encounter for general adult medical examination without abnormal findings: Secondary | ICD-10-CM | POA: Diagnosis not present

## 2017-07-06 NOTE — Patient Instructions (Addendum)
Joel Williams , Thank you for taking time to come for your Medicare Wellness Visit. I appreciate your ongoing commitment to your health goals. Please review the following plan we discussed and let me know if I can assist you in the future.   Shingrix is a vaccine for the prevention of Shingles in Adults 50 and older.  If you are on Medicare, the shingrix is covered under your Part D plan, so you will take both of the vaccines in the series at your pharmacy. Please check with your benefits regarding applicable copays or out of pocket expenses.  The Shingrix is given in 2 vaccines approx 8 weeks apart. You must receive the 2nd dose prior to 6 months from receipt of the first. Please have the pharmacist print out you Immunization  dates for our office records   Make an apt with Dr. Elease Hashimoto    These are the goals we discussed: Goals    . Weight (lb) < 235 lb (106.6 kg)     May try to start doing some of the things that made you successful in the past Triggers that make you slide off the wagon., becomes to difficult  May consider grass fed beef  You can grill vegetables; (flat top grill )  Can pick better options at [facility], you can eat a garden salad with barbecue salad        This is a list of the screening recommended for you and due dates:  Health Maintenance  Topic Date Due  . Hemoglobin A1C  07/15/2017*  . Complete foot exam   10/13/2017*  . Flu Shot  10/14/2017  . Eye exam for diabetics  05/13/2018  . Colon Cancer Screening  04/10/2019  . Tetanus Vaccine  07/04/2025  .  Hepatitis C: One time screening is recommended by Center for Disease Control  (CDC) for  adults born from [birth year] through 1965.   Completed  . Pneumonia vaccines  Completed  *Topic was postponed. The date shown is not the original due date.      Fall Prevention in the Home Falls can cause injuries. They can happen to people of all ages. There are many things you can do to make your home safe  and to help prevent falls. What can I do on the outside of my home?  Regularly fix the edges of walkways and driveways and fix any cracks.  Remove anything that might make you trip as you walk through a door, such as a raised step or threshold.  Trim any bushes or trees on the path to your home.  Use bright outdoor lighting.  Clear any walking paths of anything that might make someone trip, such as rocks or tools.  Regularly check to see if handrails are loose or broken. Make sure that both sides of any steps have handrails.  Any raised decks and porches should have guardrails on the edges.  Have any leaves, snow, or ice cleared regularly.  Use sand or salt on walking paths during winter.  Clean up any spills in your garage right away. This includes oil or grease spills. What can I do in the bathroom?  Use night lights.  Install grab bars by the toilet and in the tub and shower. Do not use towel bars as grab bars.  Use non-skid mats or decals in the tub or shower.  If you need to sit down in the shower, use a plastic, non-slip stool.  Keep the floor dry.  Clean up any water that spills on the floor as soon as it happens.  Remove soap buildup in the tub or shower regularly.  Attach bath mats securely with double-sided non-slip rug tape.  Do not have throw rugs and other things on the floor that can make you trip. What can I do in the bedroom?  Use night lights.  Make sure that you have a light by your bed that is easy to reach.  Do not use any sheets or blankets that are too big for your bed. They should not hang down onto the floor.  Have a firm chair that has side arms. You can use this for support while you get dressed.  Do not have throw rugs and other things on the floor that can make you trip. What can I do in the kitchen?  Clean up any spills right away.  Avoid walking on wet floors.  Keep items that you use a lot in easy-to-reach places.  If you need to  reach something above you, use a strong step stool that has a grab bar.  Keep electrical cords out of the way.  Do not use floor polish or wax that makes floors slippery. If you must use wax, use non-skid floor wax.  Do not have throw rugs and other things on the floor that can make you trip. What can I do with my stairs?  Do not leave any items on the stairs.  Make sure that there are handrails on both sides of the stairs and use them. Fix handrails that are broken or loose. Make sure that handrails are as long as the stairways.  Check any carpeting to make sure that it is firmly attached to the stairs. Fix any carpet that is loose or worn.  Avoid having throw rugs at the top or bottom of the stairs. If you do have throw rugs, attach them to the floor with carpet tape.  Make sure that you have a light switch at the top of the stairs and the bottom of the stairs. If you do not have them, ask someone to add them for you. What else can I do to help prevent falls?  Wear shoes that: ? Do not have high heels. ? Have rubber bottoms. ? Are comfortable and fit you well. ? Are closed at the toe. Do not wear sandals.  If you use a stepladder: ? Make sure that it is fully opened. Do not climb a closed stepladder. ? Make sure that both sides of the stepladder are locked into place. ? Ask someone to hold it for you, if possible.  Clearly mark and make sure that you can see: ? Any grab bars or handrails. ? First and last steps. ? Where the edge of each step is.  Use tools that help you move around (mobility aids) if they are needed. These include: ? Canes. ? Walkers. ? Scooters. ? Crutches.  Turn on the lights when you go into a dark area. Replace any light bulbs as soon as they burn out.  Set up your furniture so you have a clear path. Avoid moving your furniture around.  If any of your floors are uneven, fix them.  If there are any pets around you, be aware of where they  are.  Review your medicines with your doctor. Some medicines can make you feel dizzy. This can increase your chance of falling. Ask your doctor what other things that you can do to help prevent  falls. This information is not intended to replace advice given to you by your health care provider. Make sure you discuss any questions you have with your health care provider. Document Released: 12/27/2008 Document Revised: 08/08/2015 Document Reviewed: 04/06/2014 Elsevier Interactive Patient Education  2018 [place] Maintenance, Male A healthy lifestyle and preventive care is important for your health and wellness. Ask your health care provider about what schedule of regular examinations is right for you. What should I know about weight and diet? Eat a Healthy Diet  Eat plenty of vegetables, fruits, whole grains, low-fat dairy products, and lean protein.  Do not eat a lot of foods high in solid fats, added sugars, or salt.  Maintain a Healthy Weight Regular exercise can help you achieve or maintain a healthy weight. You should:  Do at least 150 minutes of exercise each week. The exercise should increase your heart rate and make you sweat (moderate-intensity exercise).  Do strength-training exercises at least twice a week.  Watch Your Levels of Cholesterol and Blood Lipids  Have your blood tested for lipids and cholesterol every 5 years starting at 68 years of age. If you are at high risk for heart disease, you should start having your blood tested when you are 68 years old. You may need to have your cholesterol levels checked more often if: ? Your lipid or cholesterol levels are high. ? You are older than 68 years of age. ? You are at high risk for heart disease.  What should I know about cancer screening? Many types of cancers can be detected early and may often be prevented. Lung Cancer  You should be screened every year for lung cancer if: ? You are a current smoker  who has smoked for at least 30 years. ? You are a former smoker who has quit within the past 15 years.  Talk to your health care provider about your screening options, when you should start screening, and how often you should be screened.  Colorectal Cancer  Routine colorectal cancer screening usually begins at 68 years of age and should be repeated every 5-10 years until you are 68 years old. You may need to be screened more often if early forms of precancerous polyps or small growths are found. Your health care provider may recommend screening at an earlier age if you have risk factors for colon cancer.  Your health care provider may recommend using home test kits to check for hidden blood in the stool.  A small camera at the end of a tube can be used to examine your colon (sigmoidoscopy or colonoscopy). This checks for the earliest forms of colorectal cancer.  Prostate and Testicular Cancer  Depending on your age and overall health, your health care provider may do certain tests to screen for prostate and testicular cancer.  Talk to your health care provider about any symptoms or concerns you have about testicular or prostate cancer.  Skin Cancer  Check your skin from head to toe regularly.  Tell your health care provider about any new moles or changes in moles, especially if: ? There is a change in a mole's size, shape, or color. ? You have a mole that is larger than a pencil eraser.  Always use sunscreen. Apply sunscreen liberally and repeat throughout the day.  Protect yourself by wearing long sleeves, pants, a wide-brimmed hat, and sunglasses when outside.  What should I know about heart disease, diabetes, and high blood pressure?  If  you are 63-94 years of age, have your blood pressure checked every 3-5 years. If you are 41 years of age or older, have your blood pressure checked every year. You should have your blood pressure measured twice-once when you are at a hospital or  clinic, and once when you are not at a hospital or clinic. Record the average of the two measurements. To check your blood pressure when you are not at a hospital or clinic, you can use: ? An automated blood pressure machine at a pharmacy. ? A home blood pressure monitor.  Talk to your health care provider about your target blood pressure.  If you are between 90-35 years old, ask your health care provider if you should take aspirin to prevent heart disease.  Have regular diabetes screenings by checking your fasting blood sugar level. ? If you are at a normal weight and have a low risk for diabetes, have this test once every three years after the age of 12. ? If you are overweight and have a high risk for diabetes, consider being tested at a younger age or more often.  A one-time screening for abdominal aortic aneurysm (AAA) by ultrasound is recommended for men aged 52-75 years who are current or former smokers. What should I know about preventing infection? Hepatitis B If you have a higher risk for hepatitis B, you should be screened for this virus. Talk with your health care provider to find out if you are at risk for hepatitis B infection. Hepatitis C Blood testing is recommended for:  Everyone born from [birth year] through 1966.  Anyone with known risk factors for hepatitis C.  Sexually Transmitted Diseases (STDs)  You should be screened each year for STDs including gonorrhea and chlamydia if: ? You are sexually active and are younger than 68 years of age. ? You are older than 68 years of age and your health care provider tells you that you are at risk for this type of infection. ? Your sexual activity has changed since you were last screened and you are at an increased risk for chlamydia or gonorrhea. Ask your health care provider if you are at risk.  Talk with your health care provider about whether you are at high risk of being infected with HIV. Your health care provider may recommend a  prescription medicine to help prevent HIV infection.  What else can I do?  Schedule regular health, dental, and eye exams.  Stay current with your vaccines (immunizations).  Do not use any tobacco products, such as cigarettes, chewing tobacco, and e-cigarettes. If you need help quitting, ask your health care provider.  Limit alcohol intake to no more than 2 drinks per day. One drink equals 12 ounces of beer, 5 ounces of wine, or 1 ounces of hard liquor.  Do not use street drugs.  Do not share needles.  Ask your health care provider for help if you need support or information about quitting drugs.  Tell your health care provider if you often feel depressed.  Tell your health care provider if you have ever been abused or do not feel safe at home. This information is not intended to replace advice given to you by your health care provider. Make sure you discuss any questions you have with your health care provider. Document Released: 08/29/2007 Document Revised: 10/30/2015 Document Reviewed: 12/04/2014 Elsevier Interactive Patient Education  [employer].

## 2017-07-07 DIAGNOSIS — I5032 Chronic diastolic (congestive) heart failure: Secondary | ICD-10-CM | POA: Insufficient documentation

## 2017-07-07 NOTE — Progress Notes (Signed)
Cardiology Office Note    Date:  07/08/2017   ID:  Tremel, Setters [birth year], MRN [id number]  PCP:  [name], MD  Cardiologist: Lauree Chandler, MD  Chief Complaint  Patient presents with  . Follow-up    History of Present Illness:  Joel Williams is a 67 y.o. male with history of CAD status post DES to the LAD in 2012, repeat cath 07/2011 patent stent, abnormal Lexiscan followed by cardiac cath 06/14/13 patent LAD stent no disease in the small RCA or circumflex.  Echo 2017 normal LV function mild MR grade 2 DD.  Has chronic dyspnea on exertion, hypertension, HLD, IDDM.  Last saw Dr. [name] 04/2016 and was doing well.  Patient comes in today for yearly f/u. Denies chest pain. Has occasional dyspnea on exertion worse with pollen and allergies. Walks 1 mile a day/ 25 min. No chest pain, palpitations, dizziness or presyncope.  Dr. Buddy Duty his endocrinologist because his blood work and he will have a fasting lipid panel by him next week.  He is on an insulin pump and has excellent control of his blood sugars.  Past Medical History:  Diagnosis Date  . ASTHMA 06/08/2008  . CAD (coronary artery disease)    a. s/p Promus DES (3.0 x 16 mm) to mid LAD 05/16/10;  b. Myoview 05/15/10: anteroseptal ischemia with EF 52%;   c. Cath 05/16/10: single vessel CAD with mLAD 90% tx with PCI and EF 50%;  d. 07/2011 Cath:  Patent stent, nonobs dzs, NL EF.  Marland Kitchen Chronic kidney disease   . Colonic polyp   . DIABETES MELLITUS, TYPE II 06/08/2008  . GERD (gastroesophageal reflux disease)   . Hemorrhoids   . HYPERLIPIDEMIA 06/08/2008  . HYPERTENSION 06/08/2008  . Impotence of organic origin 08/24/2008  . Myocardial infarction ([place]) 04/2010  . Pneumonia    hx of    Past Surgical History:  Procedure Laterality Date  . CORONARY ANGIOPLASTY WITH STENT PLACEMENT    . LEFT HEART CATHETERIZATION WITH CORONARY ANGIOGRAM N/A 07/29/2011   Procedure: LEFT HEART CATHETERIZATION WITH CORONARY ANGIOGRAM;   Surgeon: Burnell Blanks, MD;  Location: [facility] CATH LAB;  Service: Cardiovascular;  Laterality: N/A;  . LEFT HEART CATHETERIZATION WITH CORONARY ANGIOGRAM N/A 06/14/2013   Procedure: LEFT HEART CATHETERIZATION WITH CORONARY ANGIOGRAM;  Surgeon: Burnell Blanks, MD;  Location: [facility] CATH LAB;  Service: Cardiovascular;  Laterality: N/A;    Current Medications: Current Meds  Medication Sig  . Ascorbic Acid (VITAMIN C) 1000 MG tablet Take 1,000 mg by mouth 2 (two) times daily.   Marland Kitchen aspirin 81 MG tablet Take 1 tablet (81 mg total) by mouth daily.  . Biotin 1000 MCG tablet Take 1,000 mcg by mouth every evening.   . Cholecalciferol 1000 UNITS TBDP Take 1,000 Units by mouth daily.  . furosemide (LASIX) 20 MG tablet Take 1 tablet (20 mg total) by mouth daily. (Patient taking differently: Take 20 mg by mouth daily as needed. )  . glucagon (GLUCAGON EMERGENCY) 1 MG injection Inject 1 mg into the vein once as needed (Low blood sugar).   . Insulin Human (INSULIN PUMP) 100 unit/ml SOLN Inject into the skin continuous. Humalog; basal rate: 50 - 85 units per day.  Marland Kitchen JARDIANCE 10 MG TABS tablet Take 10 mg by mouth daily.  . metFORMIN (GLUCOPHAGE) 500 MG tablet Take 1,000 mg by mouth 2 (two) times daily with a meal.  . metoprolol succinate (TOPROL-XL) 50 MG 24 hr tablet Take 1  tablet by mouth twice daily.Take with or immediately following a meal. Please make appt with Dr. [name] for February. 1st attempt  . mometasone (NASONEX) 50 MCG/ACT nasal spray Place 1 spray into the nose 2 (two) times daily as needed (allergies).  . Multiple Vitamins-Minerals (ICAPS MV PO) Take 1 capsule by mouth every evening.  . nitroGLYCERIN (NITROSTAT) 0.4 MG SL tablet Place 0.4 mg under the tongue every 5 (five) minutes as needed for chest pain.   . Omega-3 Fatty Acids (FISH OIL) 1200 MG CAPS Take 1,200 mg by mouth daily.  . simvastatin (ZOCOR) 20 MG tablet TAKE 1 TABLET (20 MG TOTAL) BY MOUTH DAILY AT 6 PM.  . tadalafil  (CIALIS) 20 MG tablet Take 20 mg by mouth every other day as needed for erectile dysfunction.  . vitamin E 400 UNIT capsule Take 400 Units by mouth every evening.  . [DISCONTINUED] amLODipine (NORVASC) 10 MG tablet Take 1 tablet by mouth daily. Patient overdue for an appointment and needs to call and schedule for further refills 3rd/final attempt  . [DISCONTINUED] clopidogrel (PLAVIX) 75 MG tablet Take 1 tablet by mouth daily. Patient overdue for an appointment and needs to call and schedule for further refills 3rd/final attempt  . [DISCONTINUED] lisinopril (PRINIVIL,ZESTRIL) 40 MG tablet Take 1 tablet (40 mg total) by mouth daily. Please make yearly appt with Dr. [name] for February. 1st attempt     Allergies:   Patient has no known allergies.   Social History   Socioeconomic History  . Marital status: Married    Spouse name: Not on file  . Number of children: 2  . Years of education: Not on file  . Highest education level: Not on file  Occupational History  . Occupation: Press photographer  Social Needs  . Financial resource strain: Not on file  . Food insecurity:    Worry: Not on file    Inability: Not on file  . Transportation needs:    Medical: Not on file    Non-medical: Not on file  Tobacco Use  . Smoking status: Former Smoker    Packs/day: 2.00    Years: 53.00    Pack years: 106.00    Types: Cigarettes    Last attempt to quit: 05/23/2002    Years since quitting: 15.1  . Smokeless tobacco: Never Used  Substance and Sexual Activity  . Alcohol use: Yes    Alcohol/week: 8.4 oz    Types: 14 Shots of liquor per week    Comment: one drink a day  . Drug use: No  . Sexual activity: Not Currently  Lifestyle  . Physical activity:    Days per week: Not on file    Minutes per session: Not on file  . Stress: Not on file  Relationships  . Social connections:    Talks on phone: Not on file    Gets together: Not on file    Attends religious service: Not on file    Active member of club  or organization: Not on file    Attends meetings of clubs or organizations: Not on file    Relationship status: Not on file  Other Topics Concern  . Not on file  Social History Narrative  . Not on file     Family History:  The patient's family history includes Angina in his father; Dementia in his mother; Diabetes in his father; Heart disease in his father.   ROS:   Please see the history of present illness.  Review of Systems  Constitution: Positive for weight gain.  HENT: Negative.   Cardiovascular: Positive for dyspnea on exertion.  Respiratory: Negative.   Endocrine: Negative.   Hematologic/Lymphatic: Negative.   Musculoskeletal: Negative.        Back of head pain when turns his neck to the right.  Gastrointestinal: Negative.   Genitourinary: Negative.   Neurological: Negative.    All other systems reviewed and are negative.   PHYSICAL EXAM:   VS:  BP 134/86   Pulse 65   Ht 5\' 10"  (1.778 m)   Wt 253 lb (114.8 kg)   BMI 36.30 kg/m   Physical Exam  GEN: Obese, in no acute distress  Neck: no JVD, carotid bruits, or masses Cardiac:RRR; no murmurs, rubs, or gallops  Respiratory:  clear to auscultation bilaterally, normal work of breathing GI: soft, nontender, nondistended, + BS Ext: without cyanosis, clubbing, or edema, Good distal pulses bilaterally Neuro:  Alert and Oriented x 3 Psych: euthymic mood, full affect  Wt Readings from Last 3 Encounters:  07/08/17 253 lb (114.8 kg)  07/06/17 252 lb (114.3 kg)  07/03/16 253 lb 4.8 oz (114.9 kg)      Studies/Labs Reviewed:   EKG:  EKG is ordered today.  The ekg ordered today demonstrates normal sinus rhythm with right bundle branch block and left posterior fascicular block no acute change.  Recent Labs: 09/25/2016: ALT 46; BUN 17; Creatinine 0.8; Hemoglobin 14.3; Platelets 137; Potassium 4.5; Sodium 140   Lipid Panel    Component Value Date/Time   CHOL 140 06/12/2015   TRIG 250 (A) 06/12/2015   HDL 38  06/12/2015   CHOLHDL 4 06/21/2014 0841   VLDL 49.8 (H) 06/21/2014 0841   LDLCALC 51 06/12/2015   LDLDIRECT 73.0 06/21/2014 0841    Additional studies/ records that were reviewed today include:   2D echo 2017Study Conclusions   - Left ventricle: The cavity size was normal. There was mild focal   basal hypertrophy of the septum. Systolic function was normal.   The estimated ejection fraction was in the range of 60% to 65%.   Wall motion was normal; there were no regional wall motion   abnormalities. Features are consistent with a pseudonormal left   ventricular filling pattern, with concomitant abnormal relaxation   and increased filling pressure (grade 2 diastolic dysfunction). - Aortic valve: Trileaflet; mildly thickened, mildly calcified   leaflets. - Mitral valve: Calcified annulus. Mildly thickened leaflets .   There was mild regurgitation. - Left atrium: The atrium was mildly to moderately dilated.   Volume/bsa, ES, (1-plane Simpson&'s, A2C): 45.8 ml/m^2.   Cardiac cath 2015Angiographic Findings:   Left main: No obstructive disease.    Left Anterior Descending Artery: Moderate to large caliber vessel that courses to the apex. The proximal stent is patent with no restenosis. The mid and distal vessel has no obstructive disease. There are two small caliber diagonal branches with no obstructive disease.    Circumflex Artery: Large caliber vessel with early intermediate branch followed by two moderate sized OM branches. No obstructive disease noted.    Right Coronary Artery: Small non-dominant vessel with no obstructive disease noted.    Left Ventricular Angiogram: LVEF 50%.    Impression: 1. Single vessel CAD with patent mid LAD stent 2. Normal LV function   Recommendations: Continue medical management of CAD. I do not think his dyspnea is cardiac related.     ASSESSMENT:    1. Atherosclerosis of native coronary artery of native heart  without angina pectoris   2.  Essential hypertension   3. Mixed hyperlipidemia   4. Chronic diastolic CHF (congestive heart failure) (HCC)   5. Obesity with body mass index (BMI) of 30.0 to 39.9      PLAN:  In order of problems listed above:  CAD status post stent to the LAD 2013 most recent cath 2015 patent stent, normal LV function, normal LV function on echo in 2017.  Patient doing well without angina.  Continue Plavix, aspirin  Essential hypertension blood pressure controlled with Norvasc lisinopril metoprolol  Mixed hyperlipidemia continue Zocor and fish oil.  Patient will have a fasting lipid panel drawn by Dr. Buddy Duty May 2 and have sent to [place].   Chronic diastolic CHF compensated without edema.  Obesity weight loss programs such as a weight loss center or weight watchers recommended to the patient.  He is interested to do something with his wife.  He will call us if he wants referral to the weight loss center here in [place].  Medication Adjustments/Labs and Tests Ordered: Current medicines are reviewed at length with the patient today.  Concerns regarding medicines are outlined above.  Medication changes, Labs and Tests ordered today are listed in the Patient Instructions below. Patient Instructions  Medication Instructions:  Your physician recommends that you continue on your current medications as directed. Please refer to the Current Medication list given to you today.   Labwork: Your physician recommends that you have a FASTING lipid profile drawn with Dr. Buddy Duty on 07/15/17 and have them fax [place] over the results.  Testing/Procedures: None ordered  Follow-Up: Your physician wants you to follow-up in: 1 year with Dr. [name]. You will receive a reminder letter in the mail two months in advance. If you don't receive a letter, please call our office to schedule the follow-up appointment.   Any Other Special Instructions Will Be Listed Below (If Applicable).  Call us back and let us know if you are  going to start Weight Watchers or if you would like for [place] to refer you to a Weight Loss Management Program.    If you need a refill on your cardiac medications before your next appointment, please call your pharmacy.   Exercising to Stay Healthy Exercising regularly is important. It has many health benefits, such as:  Improving your overall fitness, flexibility, and endurance.  Increasing your bone density.  Helping with weight control.  Decreasing your body fat.  Increasing your muscle strength.  Reducing stress and tension.  Improving your overall health.  In order to become healthy and stay healthy, it is recommended that you do moderate-intensity and vigorous-intensity exercise. You can tell that you are exercising at a moderate intensity if you have a higher heart rate and faster breathing, but you are still able to hold a conversation. You can tell that you are exercising at a vigorous intensity if you are breathing much harder and faster and cannot hold a conversation while exercising. How often should I exercise? Choose an activity that you enjoy and set realistic goals. Your health care provider can help you to make an activity plan that works for you. Exercise regularly as directed by your health care provider. This may include:  Doing resistance training twice each week, such as: ? Push-ups. ? Sit-ups. ? Lifting weights. ? Using resistance bands.  Doing a given intensity of exercise for a given amount of time. Choose from these options: ? 150 minutes of moderate-intensity exercise every week. ? 75  minutes of vigorous-intensity exercise every week. ? A mix of moderate-intensity and vigorous-intensity exercise every week.  Children, pregnant women, people who are out of shape, people who are overweight, and older adults may need to consult a health care provider for individual recommendations. If you have any sort of medical condition, be sure to consult your health  care provider before starting a new exercise program. What are some exercise ideas? Some moderate-intensity exercise ideas include:  Walking at a rate of 1 mile in 15 minutes.  Biking.  Hiking.  Golfing.  Dancing.  Some vigorous-intensity exercise ideas include:  Walking at a rate of at least 4.5 miles per hour.  Jogging or running at a rate of 5 miles per hour.  Biking at a rate of at least 10 miles per hour.  Lap swimming.  Roller-skating or in-line skating.  Cross-country skiing.  Vigorous competitive sports, such as football, basketball, and soccer.  Jumping rope.  Aerobic dancing.  What are some everyday activities that can help me to get exercise?  [place] work, such as: ? Pushing a Conservation officer, nature. ? Raking and bagging leaves.  Washing and waxing your car.  Pushing a stroller.  Shoveling snow.  Gardening.  Washing windows or floors. How can I be more active in my day-to-day activities?  Use the stairs instead of the elevator.  Take a walk during your lunch break.  If you drive, park your car farther away from work or school.  If you take public transportation, get off one stop early and walk the rest of the way.  Make all of your phone calls while standing up and walking around.  Get up, stretch, and walk around every 30 minutes throughout the day. What guidelines should I follow while exercising?  Do not exercise so much that you hurt yourself, feel dizzy, or get very short of breath.  Consult your health care provider before starting a new exercise program.  Wear comfortable clothes and shoes with good support.  Drink plenty of water while you exercise to prevent dehydration or heat stroke. Body water is lost during exercise and must be replaced.  Work out until you breathe faster and your heart beats faster. This information is not intended to replace advice given to you by your health care provider. Make sure you discuss any questions you  have with your health care provider. Document Released: 04/04/2010 Document Revised: 08/08/2015 Document Reviewed: 08/03/2013 Elsevier Interactive Patient Education  2018 [place], [name], [place]  07/08/2017 10:45 AM    [facility] [place], [place], [place]  [address] Phone: [phone]; Fax: [phone]

## 2017-07-08 ENCOUNTER — Ambulatory Visit (INDEPENDENT_AMBULATORY_CARE_PROVIDER_SITE_OTHER): Payer: Medicare Other | Admitting: Physician Assistant

## 2017-07-08 ENCOUNTER — Encounter: Payer: Self-pay | Admitting: Physician Assistant

## 2017-07-08 VITALS — BP 134/86 | HR 65 | Ht 70.0 in | Wt 253.0 lb

## 2017-07-08 DIAGNOSIS — E669 Obesity, unspecified: Secondary | ICD-10-CM | POA: Insufficient documentation

## 2017-07-08 DIAGNOSIS — I5032 Chronic diastolic (congestive) heart failure: Secondary | ICD-10-CM

## 2017-07-08 DIAGNOSIS — I10 Essential (primary) hypertension: Secondary | ICD-10-CM | POA: Diagnosis not present

## 2017-07-08 DIAGNOSIS — I2510 Atherosclerotic heart disease of native coronary artery without angina pectoris: Secondary | ICD-10-CM | POA: Diagnosis not present

## 2017-07-08 DIAGNOSIS — E782 Mixed hyperlipidemia: Secondary | ICD-10-CM

## 2017-07-08 MED ORDER — AMLODIPINE BESYLATE 10 MG PO TABS: 10.0000 mg | ORAL_TABLET | Freq: Every day | ORAL | 3 refills | Status: DC

## 2017-07-08 MED ORDER — CLOPIDOGREL BISULFATE 75 MG PO TABS: 75.0000 mg | ORAL_TABLET | Freq: Once | ORAL | 3 refills | Status: AC

## 2017-07-08 MED ORDER — LISINOPRIL 40 MG PO TABS: 40.0000 mg | ORAL_TABLET | Freq: Every day | ORAL | 3 refills | Status: DC

## 2017-07-08 NOTE — Patient Instructions (Signed)
Medication Instructions:  Your physician recommends that you continue on your current medications as directed. Please refer to the Current Medication list given to you today.   Labwork: Your physician recommends that you have a FASTING lipid profile drawn with Dr. Buddy Duty on 07/15/17 and have them fax [place] over the results.  Testing/Procedures: None ordered  Follow-Up: Your physician wants you to follow-up in: 1 year with Dr. [name]. You will receive a reminder letter in the mail two months in advance. If you don't receive a letter, please call our office to schedule the follow-up appointment.   Any Other Special Instructions Will Be Listed Below (If Applicable).  Call us back and let us know if you are going to start Weight Watchers or if you would like for [place] to refer you to a Weight Loss Management Program.    If you need a refill on your cardiac medications before your next appointment, please call your pharmacy.   Exercising to Stay Healthy Exercising regularly is important. It has many health benefits, such as:  Improving your overall fitness, flexibility, and endurance.  Increasing your bone density.  Helping with weight control.  Decreasing your body fat.  Increasing your muscle strength.  Reducing stress and tension.  Improving your overall health.  In order to become healthy and stay healthy, it is recommended that you do moderate-intensity and vigorous-intensity exercise. You can tell that you are exercising at a moderate intensity if you have a higher heart rate and faster breathing, but you are still able to hold a conversation. You can tell that you are exercising at a vigorous intensity if you are breathing much harder and faster and cannot hold a conversation while exercising. How often should I exercise? Choose an activity that you enjoy and set realistic goals. Your health care provider can help you to make an activity plan that works for you. Exercise regularly  as directed by your health care provider. This may include:  Doing resistance training twice each week, such as: ? Push-ups. ? Sit-ups. ? Lifting weights. ? Using resistance bands.  Doing a given intensity of exercise for a given amount of time. Choose from these options: ? 150 minutes of moderate-intensity exercise every week. ? 75 minutes of vigorous-intensity exercise every week. ? A mix of moderate-intensity and vigorous-intensity exercise every week.  Children, pregnant women, people who are out of shape, people who are overweight, and older adults may need to consult a health care provider for individual recommendations. If you have any sort of medical condition, be sure to consult your health care provider before starting a new exercise program. What are some exercise ideas? Some moderate-intensity exercise ideas include:  Walking at a rate of 1 mile in 15 minutes.  Biking.  Hiking.  Golfing.  Dancing.  Some vigorous-intensity exercise ideas include:  Walking at a rate of at least 4.5 miles per hour.  Jogging or running at a rate of 5 miles per hour.  Biking at a rate of at least 10 miles per hour.  Lap swimming.  Roller-skating or in-line skating.  Cross-country skiing.  Vigorous competitive sports, such as football, basketball, and soccer.  Jumping rope.  Aerobic dancing.  What are some everyday activities that can help me to get exercise?  [place] work, such as: ? Pushing a Conservation officer, nature. ? Raking and bagging leaves.  Washing and waxing your car.  Pushing a stroller.  Shoveling snow.  Gardening.  Washing windows or floors. How can I be  more active in my day-to-day activities?  Use the stairs instead of the elevator.  Take a walk during your lunch break.  If you drive, park your car farther away from work or school.  If you take public transportation, get off one stop early and walk the rest of the way.  Make all of your phone calls while  standing up and walking around.  Get up, stretch, and walk around every 30 minutes throughout the day. What guidelines should I follow while exercising?  Do not exercise so much that you hurt yourself, feel dizzy, or get very short of breath.  Consult your health care provider before starting a new exercise program.  Wear comfortable clothes and shoes with good support.  Drink plenty of water while you exercise to prevent dehydration or heat stroke. Body water is lost during exercise and must be replaced.  Work out until you breathe faster and your heart beats faster. This information is not intended to replace advice given to you by your health care provider. Make sure you discuss any questions you have with your health care provider. Document Released: 04/04/2010 Document Revised: 08/08/2015 Document Reviewed: 08/03/2013 Elsevier Interactive Patient Education  [employer].

## 2017-07-15 DIAGNOSIS — E291 Testicular hypofunction: Secondary | ICD-10-CM | POA: Diagnosis not present

## 2017-07-15 DIAGNOSIS — Z5181 Encounter for therapeutic drug level monitoring: Secondary | ICD-10-CM | POA: Diagnosis not present

## 2017-07-15 DIAGNOSIS — E1142 Type 2 diabetes mellitus with diabetic polyneuropathy: Secondary | ICD-10-CM | POA: Diagnosis not present

## 2017-07-15 DIAGNOSIS — E1165 Type 2 diabetes mellitus with hyperglycemia: Secondary | ICD-10-CM | POA: Diagnosis not present

## 2017-07-15 DIAGNOSIS — I2510 Atherosclerotic heart disease of native coronary artery without angina pectoris: Secondary | ICD-10-CM | POA: Diagnosis not present

## 2017-07-15 DIAGNOSIS — Z794 Long term (current) use of insulin: Secondary | ICD-10-CM | POA: Diagnosis not present

## 2017-07-16 DIAGNOSIS — Z79899 Other long term (current) drug therapy: Secondary | ICD-10-CM | POA: Diagnosis not present

## 2017-07-16 DIAGNOSIS — E1165 Type 2 diabetes mellitus with hyperglycemia: Secondary | ICD-10-CM | POA: Diagnosis not present

## 2017-07-16 DIAGNOSIS — E1142 Type 2 diabetes mellitus with diabetic polyneuropathy: Secondary | ICD-10-CM | POA: Diagnosis not present

## 2017-07-16 LAB — BASIC METABOLIC PANEL
BUN: 16 (ref 4–21)
Creatinine: 0.8 (ref <=1.3)
GLUCOSE: 233
POTASSIUM: 4.4 (ref 3.4–5.3)
SODIUM: 137 (ref 137–147)

## 2017-07-16 LAB — HEPATIC FUNCTION PANEL
ALT: 46 — AB (ref 10–40)
AST: 32 (ref 14–40)
Bilirubin, Total: 0.8

## 2017-07-29 DIAGNOSIS — J209 Acute bronchitis, unspecified: Secondary | ICD-10-CM | POA: Diagnosis not present

## 2017-07-29 DIAGNOSIS — J0110 Acute frontal sinusitis, unspecified: Secondary | ICD-10-CM | POA: Diagnosis not present

## 2017-08-20 ENCOUNTER — Ambulatory Visit (INDEPENDENT_AMBULATORY_CARE_PROVIDER_SITE_OTHER)
Admission: RE | Admit: 2017-08-20 | Discharge: 2017-08-20 | Disposition: A | Discharge status: Home / Self Care | Payer: Medicare Other | Source: Ambulatory Visit | Attending: Acute Care | Admitting: Acute Care

## 2017-08-20 DIAGNOSIS — Z87891 Personal history of nicotine dependence: Secondary | ICD-10-CM

## 2017-08-31 ENCOUNTER — Other Ambulatory Visit: Payer: Self-pay | Admitting: Acute Care

## 2017-08-31 DIAGNOSIS — Z87891 Personal history of nicotine dependence: Secondary | ICD-10-CM

## 2017-08-31 DIAGNOSIS — Z122 Encounter for screening for malignant neoplasm of respiratory organs: Secondary | ICD-10-CM

## 2017-09-20 ENCOUNTER — Other Ambulatory Visit: Payer: Self-pay | Admitting: Family Medicine

## 2017-09-28 ENCOUNTER — Encounter: Payer: Self-pay | Admitting: Family Medicine

## 2017-09-28 ENCOUNTER — Ambulatory Visit (INDEPENDENT_AMBULATORY_CARE_PROVIDER_SITE_OTHER): Payer: Medicare Other | Admitting: Family Medicine

## 2017-09-28 VITALS — BP 110/68 | HR 67 | Temp 97.8°F | Wt 250.0 lb

## 2017-09-28 DIAGNOSIS — E782 Mixed hyperlipidemia: Secondary | ICD-10-CM

## 2017-09-28 DIAGNOSIS — I2510 Atherosclerotic heart disease of native coronary artery without angina pectoris: Secondary | ICD-10-CM | POA: Diagnosis not present

## 2017-09-28 MED ORDER — SIMVASTATIN 20 MG PO TABS: 20.0000 mg | ORAL_TABLET | Freq: Every day | ORAL | 3 refills | Status: DC

## 2017-09-28 NOTE — Progress Notes (Signed)
  Subjective:     Patient ID: Joel Williams, male   DOB: 1950/07/29, 67 y.o.   MRN: [id number]  HPI Patient is seen for follow-up regarding hyperlipidemia. He has history of obesity, CAD hypertension, diastolic heart failure, type 2 diabetes. He is followed closely by cardiology and endocrinology. He had multiple recent labs per endocrinology including lipid panel. He's requesting refills of simvastatin. Takes 20 mg daily. No recent chest pains. His diabetes is managed with insulin.  Past Medical History:  Diagnosis Date  . ASTHMA 06/08/2008  . CAD (coronary artery disease)    a. s/p Promus DES (3.0 x 16 mm) to mid LAD 05/16/10;  b. Myoview 05/15/10: anteroseptal ischemia with EF 52%;   c. Cath 05/16/10: single vessel CAD with mLAD 90% tx with PCI and EF 50%;  d. 07/2011 Cath:  Patent stent, nonobs dzs, NL EF.  Marland Kitchen Chronic kidney disease   . Colonic polyp   . DIABETES MELLITUS, TYPE II 06/08/2008  . GERD (gastroesophageal reflux disease)   . Hemorrhoids   . HYPERLIPIDEMIA 06/08/2008  . HYPERTENSION 06/08/2008  . Impotence of organic origin 08/24/2008  . Myocardial infarction ([place]) 04/2010  . Pneumonia    hx of   Past Surgical History:  Procedure Laterality Date  . CORONARY ANGIOPLASTY WITH STENT PLACEMENT    . LEFT HEART CATHETERIZATION WITH CORONARY ANGIOGRAM N/A 07/29/2011   Procedure: LEFT HEART CATHETERIZATION WITH CORONARY ANGIOGRAM;  Surgeon: Burnell Blanks, MD;  Location: [facility] CATH LAB;  Service: Cardiovascular;  Laterality: N/A;  . LEFT HEART CATHETERIZATION WITH CORONARY ANGIOGRAM N/A 06/14/2013   Procedure: LEFT HEART CATHETERIZATION WITH CORONARY ANGIOGRAM;  Surgeon: Burnell Blanks, MD;  Location: [facility] CATH LAB;  Service: Cardiovascular;  Laterality: N/A;    reports that he quit smoking about 15 years ago. His smoking use included cigarettes. He has a 106.00 pack-year smoking history. He has never used smokeless tobacco. He reports that he drinks about 8.4 oz of alcohol per  week. He reports that he does not use drugs. family history includes Angina in his father; Dementia in his mother; Diabetes in his father; Heart disease in his father. No Known Allergies   Review of Systems  Constitutional: Negative for fatigue.  Eyes: Negative for visual disturbance.  Respiratory: Negative for cough, chest tightness and shortness of breath.   Cardiovascular: Negative for chest pain, palpitations and leg swelling.  Neurological: Negative for dizziness, syncope, weakness, light-headedness and headaches.       Objective:   Physical Exam  Constitutional: He appears well-developed and well-nourished.  Cardiovascular: Normal rate and regular rhythm.  Pulmonary/Chest: Effort normal and breath sounds normal. He has no wheezes. He has no rales.  Musculoskeletal: He exhibits no edema.       Assessment:     Hyperlipidemia managed with  simvastatin    Plan:     -did not obtain any lab work today since he had recent labs including lipid panel per endocrinology in May -refilled simvastatin for 1 year -Follow-up yearly and sooner as needed  [name] MD [facility]

## 2017-11-03 DIAGNOSIS — E291 Testicular hypofunction: Secondary | ICD-10-CM | POA: Diagnosis not present

## 2017-11-03 DIAGNOSIS — E1165 Type 2 diabetes mellitus with hyperglycemia: Secondary | ICD-10-CM | POA: Diagnosis not present

## 2017-11-03 DIAGNOSIS — Z7984 Long term (current) use of oral hypoglycemic drugs: Secondary | ICD-10-CM | POA: Diagnosis not present

## 2017-11-03 DIAGNOSIS — I2510 Atherosclerotic heart disease of native coronary artery without angina pectoris: Secondary | ICD-10-CM | POA: Diagnosis not present

## 2017-11-03 DIAGNOSIS — E1142 Type 2 diabetes mellitus with diabetic polyneuropathy: Secondary | ICD-10-CM | POA: Diagnosis not present

## 2017-11-03 DIAGNOSIS — Z794 Long term (current) use of insulin: Secondary | ICD-10-CM | POA: Diagnosis not present

## 2017-11-03 DIAGNOSIS — Z5181 Encounter for therapeutic drug level monitoring: Secondary | ICD-10-CM | POA: Diagnosis not present

## 2017-12-04 DIAGNOSIS — Z23 Encounter for immunization: Secondary | ICD-10-CM | POA: Diagnosis not present

## 2018-01-26 LAB — GLUCOSE, POCT (MANUAL RESULT ENTRY): POC GLUCOSE: 160 mg/dL — AB (ref 70–99)

## 2018-02-15 DIAGNOSIS — Z5181 Encounter for therapeutic drug level monitoring: Secondary | ICD-10-CM | POA: Diagnosis not present

## 2018-02-15 DIAGNOSIS — E1165 Type 2 diabetes mellitus with hyperglycemia: Secondary | ICD-10-CM | POA: Diagnosis not present

## 2018-02-15 DIAGNOSIS — E1142 Type 2 diabetes mellitus with diabetic polyneuropathy: Secondary | ICD-10-CM | POA: Diagnosis not present

## 2018-02-15 DIAGNOSIS — I2510 Atherosclerotic heart disease of native coronary artery without angina pectoris: Secondary | ICD-10-CM | POA: Diagnosis not present

## 2018-02-15 DIAGNOSIS — Z794 Long term (current) use of insulin: Secondary | ICD-10-CM | POA: Diagnosis not present

## 2018-02-15 DIAGNOSIS — E291 Testicular hypofunction: Secondary | ICD-10-CM | POA: Diagnosis not present

## 2018-02-18 DIAGNOSIS — E1142 Type 2 diabetes mellitus with diabetic polyneuropathy: Secondary | ICD-10-CM | POA: Diagnosis not present

## 2018-03-23 LAB — GLUCOSE, POCT (MANUAL RESULT ENTRY): POC Glucose: 255 mg/dl — AB (ref 70–99)

## 2018-04-01 ENCOUNTER — Other Ambulatory Visit: Payer: Self-pay | Admitting: Cardiovascular Disease

## 2018-04-08 ENCOUNTER — Encounter: Payer: Self-pay | Admitting: Family Medicine

## 2018-04-12 ENCOUNTER — Encounter: Payer: Self-pay | Admitting: Family Medicine

## 2018-04-12 ENCOUNTER — Ambulatory Visit (INDEPENDENT_AMBULATORY_CARE_PROVIDER_SITE_OTHER): Payer: Medicare Other

## 2018-04-12 ENCOUNTER — Ambulatory Visit (INDEPENDENT_AMBULATORY_CARE_PROVIDER_SITE_OTHER): Payer: Medicare Other | Admitting: Family Medicine

## 2018-04-12 ENCOUNTER — Other Ambulatory Visit: Payer: Self-pay

## 2018-04-12 VITALS — BP 118/70 | HR 66 | Temp 97.9°F | Ht 70.0 in | Wt 249.7 lb

## 2018-04-12 DIAGNOSIS — R29898 Other symptoms and signs involving the musculoskeletal system: Secondary | ICD-10-CM

## 2018-04-12 DIAGNOSIS — M542 Cervicalgia: Secondary | ICD-10-CM | POA: Diagnosis not present

## 2018-04-12 NOTE — Progress Notes (Signed)
Subjective:     Patient ID: Joel Williams, male   DOB: Jun 21, 1950, 68 y.o.   MRN: [id number]  HPI Patient is seen with some left hand pain but more than that he has noticed some loss of strength in the hand over the past month or so.  He thinks he may have had some swelling of the hand as well.  He has been occasionally dropping some items.  He has pain that radiates from near the base of the left side of the neck down toward the shoulder and down toward the hand at times.  No numbness.  Denies any prior history of cervical disc issues.  He has had prior history of low back surgery.  No recent injury.  His chronic problems include history of obesity, hypertension, chronic diastolic heart failure, CAD, type 2 diabetes, hyperlipidemia  Past Medical History:  Diagnosis Date  . ASTHMA 06/08/2008  . CAD (coronary artery disease)    a. s/p Promus DES (3.0 x 16 mm) to mid LAD 05/16/10;  b. Myoview 05/15/10: anteroseptal ischemia with EF 52%;   c. Cath 05/16/10: single vessel CAD with mLAD 90% tx with PCI and EF 50%;  d. 07/2011 Cath:  Patent stent, nonobs dzs, NL EF.  Marland Kitchen Chronic kidney disease   . Colonic polyp   . DIABETES MELLITUS, TYPE II 06/08/2008  . GERD (gastroesophageal reflux disease)   . Hemorrhoids   . HYPERLIPIDEMIA 06/08/2008  . HYPERTENSION 06/08/2008  . Impotence of organic origin 08/24/2008  . Myocardial infarction ([place]) 04/2010  . Pneumonia    hx of   Past Surgical History:  Procedure Laterality Date  . CORONARY ANGIOPLASTY WITH STENT PLACEMENT    . LEFT HEART CATHETERIZATION WITH CORONARY ANGIOGRAM N/A 07/29/2011   Procedure: LEFT HEART CATHETERIZATION WITH CORONARY ANGIOGRAM;  Surgeon: Burnell Blanks, MD;  Location: [facility] CATH LAB;  Service: Cardiovascular;  Laterality: N/A;  . LEFT HEART CATHETERIZATION WITH CORONARY ANGIOGRAM N/A 06/14/2013   Procedure: LEFT HEART CATHETERIZATION WITH CORONARY ANGIOGRAM;  Surgeon: Burnell Blanks, MD;  Location: [facility] CATH LAB;  Service:  Cardiovascular;  Laterality: N/A;    reports that he quit smoking about 15 years ago. His smoking use included cigarettes. He has a 106.00 pack-year smoking history. He has never used smokeless tobacco. He reports current alcohol use of about 14.0 standard drinks of alcohol per week. He reports that he does not use drugs. family history includes Angina in his father; Dementia in his mother; Diabetes in his father; Heart disease in his father. No Known Allergies   Review of Systems  Respiratory: Negative for shortness of breath.   Cardiovascular: Negative for chest pain.  Musculoskeletal: Positive for neck pain.  Neurological: Positive for tremors and weakness. Negative for numbness.  Hematological: Negative for adenopathy.       Objective:   Physical Exam Constitutional:      Appearance: Normal appearance.  Neck:     Musculoskeletal: Neck supple.  Cardiovascular:     Rate and Rhythm: Normal rate and regular rhythm.  Pulmonary:     Effort: Pulmonary effort is normal.     Breath sounds: Normal breath sounds.  Musculoskeletal:     Comments: Some pain with lateral bending neck to the left side but not the right  Neurological:     Mental Status: He is alert.     Comments: He does have some weakness with grip on the left compared to the right and also with opposition of the thumb and  index finger on the left compared to the right.  No muscle atrophy.  Reflexes remain 2+ brachial radialis and biceps.  No sensory impairment to touch        Assessment:     Patient is seen with 1 month history of decreased strength left upper extremity- predominantly hand.  Does have some pain rating from the base of the neck and suspect this probably cervical radiculopathy related.  Doubt carpal tunnel.    Plan:     -We will get plain films of cervical spine. -Consider nerve conduction velocities. -May need cervical MRI to further assess.  He has been very claustrophobic with MRIs in the  past  [name] MD [facility]

## 2018-04-12 NOTE — Patient Instructions (Signed)
We will set up nerve conduction velocities to further assess.Marland Kitchen

## 2018-04-14 ENCOUNTER — Encounter: Payer: Self-pay | Admitting: Family Medicine

## 2018-05-19 DIAGNOSIS — E291 Testicular hypofunction: Secondary | ICD-10-CM | POA: Diagnosis not present

## 2018-05-19 DIAGNOSIS — Z5181 Encounter for therapeutic drug level monitoring: Secondary | ICD-10-CM | POA: Diagnosis not present

## 2018-05-19 DIAGNOSIS — E1142 Type 2 diabetes mellitus with diabetic polyneuropathy: Secondary | ICD-10-CM | POA: Diagnosis not present

## 2018-05-19 DIAGNOSIS — I2510 Atherosclerotic heart disease of native coronary artery without angina pectoris: Secondary | ICD-10-CM | POA: Diagnosis not present

## 2018-05-19 DIAGNOSIS — E1165 Type 2 diabetes mellitus with hyperglycemia: Secondary | ICD-10-CM | POA: Diagnosis not present

## 2018-05-19 DIAGNOSIS — Z794 Long term (current) use of insulin: Secondary | ICD-10-CM | POA: Diagnosis not present

## 2018-06-15 ENCOUNTER — Telehealth: Payer: Self-pay

## 2018-06-15 NOTE — Telephone Encounter (Signed)
Author phoned pt. to assess interest in re-scheduling awv to virtual awv. Pt. Agreed, and scheduled to see Dr. Elease Hashimoto 4/28 at [place], prefers using facetime. Routed to [place] as FYI to follow-up as appointment approaches.

## 2018-06-20 NOTE — Telephone Encounter (Signed)
Noted  

## 2018-06-25 ENCOUNTER — Other Ambulatory Visit: Payer: Self-pay | Admitting: Cardiovascular Disease

## 2018-06-28 ENCOUNTER — Other Ambulatory Visit: Payer: Self-pay | Admitting: Physician Assistant

## 2018-06-28 DIAGNOSIS — I2510 Atherosclerotic heart disease of native coronary artery without angina pectoris: Secondary | ICD-10-CM

## 2018-07-12 ENCOUNTER — Other Ambulatory Visit: Payer: Self-pay

## 2018-07-12 ENCOUNTER — Ambulatory Visit (INDEPENDENT_AMBULATORY_CARE_PROVIDER_SITE_OTHER): Payer: Medicare Other | Admitting: Family Medicine

## 2018-07-12 DIAGNOSIS — Z0000 Encounter for general adult medical examination without abnormal findings: Secondary | ICD-10-CM | POA: Diagnosis not present

## 2018-07-12 NOTE — Progress Notes (Signed)
Patient ID: Joel Williams, male   DOB: 1950/06/07, 68 y.o.   MRN: [id number]  This visit type was conducted due to national recommendations for restrictions regarding the COVID-19 pandemic in an effort to limit this patient's exposure and mitigate transmission in our community.   Virtual Visit via Telephone Note  I connected with Joel Williams on 07/12/18 at  8:00 AM EDT by telephone and verified that I am speaking with the correct person using two identifiers.   I discussed the limitations, risks, security and privacy concerns of performing an evaluation and management service by telephone and the availability of in person appointments. I also discussed with the patient that there may be a patient responsible charge related to this service. The patient expressed understanding and agreed to proceed.  Location patient: home Location provider: work or home office Participants present for the call: patient, provider Patient did not have a visit in the prior 7 days to address this/these issue(s).   History of Present Illness:  Patient set up for Medicare annual wellness visit.  He has history of CAD, type 2 diabetes, dyslipidemia, hypertension.  He is followed by cardiology and endocrinology.  He gets every 72-month follow-up regarding his A1c and foot exams.  Last eye exam was last February and actually has been set up for May  Past Medical History:  Diagnosis Date  . ASTHMA 06/08/2008  . CAD (coronary artery disease)    a. s/p Promus DES (3.0 x 16 mm) to mid LAD 05/16/10;  b. Myoview 05/15/10: anteroseptal ischemia with EF 52%;   c. Cath 05/16/10: single vessel CAD with mLAD 90% tx with PCI and EF 50%;  d. 07/2011 Cath:  Patent stent, nonobs dzs, NL EF.  Marland Kitchen Chronic kidney disease   . Colonic polyp   . DIABETES MELLITUS, TYPE II 06/08/2008  . GERD (gastroesophageal reflux disease)   . Hemorrhoids   . HYPERLIPIDEMIA 06/08/2008  . HYPERTENSION 06/08/2008  . Impotence of organic origin 08/24/2008   . Myocardial infarction ([place]) 04/2010  . Pneumonia    hx of   Past Surgical History:  Procedure Laterality Date  . CORONARY ANGIOPLASTY WITH STENT PLACEMENT    . LEFT HEART CATHETERIZATION WITH CORONARY ANGIOGRAM N/A 07/29/2011   Procedure: LEFT HEART CATHETERIZATION WITH CORONARY ANGIOGRAM;  Surgeon: Burnell Blanks, MD;  Location: [facility] CATH LAB;  Service: Cardiovascular;  Laterality: N/A;  . LEFT HEART CATHETERIZATION WITH CORONARY ANGIOGRAM N/A 06/14/2013   Procedure: LEFT HEART CATHETERIZATION WITH CORONARY ANGIOGRAM;  Surgeon: Burnell Blanks, MD;  Location: [facility] CATH LAB;  Service: Cardiovascular;  Laterality: N/A;    reports that he quit smoking about 16 years ago. His smoking use included cigarettes. He has a 106.00 pack-year smoking history. He has never used smokeless tobacco. He reports current alcohol use of about 14.0 standard drinks of alcohol per week. He reports that he does not use drugs. family history includes Angina in his father; Dementia in his mother; Diabetes in his father; Heart disease in his father. No Known Allergies  1.  Risk factors based on Past Medical , Social, and Family history reviewed and as indicated above with no changes  2.  Limitations in physical activities None.  No recent falls.  No formal exercise.  No balance issues.  3.  Depression/mood No active depression or anxiety issues PHQ 2 equals 0  4.  Hearing No defiits  5.  ADLs independent in all.  6.  Cognitive function (orientation to time and place, language,  writing, speech,memory) no short or long term memory issues.  Language and judgement intact.  He is very engaged in several activities including volunteerism with his church and enjoys classic cars  7.  Home Safety no issues  8.  Height, weight, and visual acuity.all stable.  Has eye exam scheduled for May  9.  Counseling discussed -directives.  He states he has all these in place  10. Recommendation of preventive services.   We discussed newer shingles vaccine and he will explore this with his pharmacy  11. Labs based on risk factors-getting lab work through endocrinology  12. Care Plan-as above  13. Other Providers-Dr. Buddy Duty endocrinology, Dr. [name], cardiology  14. Written schedule of screening/prevention services given to patient. -He gets flu vaccine annually -Tetanus due 4/27 -Colonoscopy due 04/10/2019 -Pneumonia vaccines complete -Hepatitis C antibody negative -Eye exam yearly    Observations/Objective: Patient sounds cheerful and well on the phone. I do not appreciate any SOB. Speech and thought processing are grossly intact. Patient reported vitals: none  Assessment and Plan: Medicare subsequent annual wellness visit -Discussed issues as above -He will check on coverage for shingles vaccine  Follow Up Instructions:  -Continue close follow-up with cardiology and endocrinology -Continue annual follow-up for Medicare wellness visit  I did not refer this patient for an OV in the next 24 hours for this/these issue(s).  I discussed the assessment and treatment plan with the patient. The patient was provided an opportunity to ask questions and all were answered. The patient agreed with the plan and demonstrated an understanding of the instructions.   The patient was advised to call back or seek an in-person evaluation if the symptoms worsen or if the condition fails to improve as anticipated.  I provided 25 minutes of non-face-to-face time during this encounter.   Carolann Littler, MD

## 2018-08-06 ENCOUNTER — Other Ambulatory Visit: Payer: Self-pay

## 2018-08-09 MED ORDER — METOPROLOL SUCCINATE ER 50 MG PO TB24: ORAL_TABLET | ORAL | 0 refills | Status: DC

## 2018-08-15 NOTE — Progress Notes (Signed)
Virtual Visit via Video Note   This visit type was conducted due to national recommendations for restrictions regarding the COVID-19 Pandemic (e.g. social distancing) in an effort to limit this patient's exposure and mitigate transmission in our community.  Due to his co-morbid illnesses, this patient is at least at moderate risk for complications without adequate follow up.  This format is felt to be most appropriate for this patient at this time.  All issues noted in this document were discussed and addressed.  A limited physical exam was performed with this format.  Please refer to the patient's chart for his consent to telehealth for [facility].   Date:  08/16/2018   ID:  Joel Williams, DOB [birth year], MRN [id number]  Patient Location: Home Provider Location: Home  PCP:  [name], MD  Cardiologist:  Lauree Chandler, MD  Electrophysiologist:  None   Evaluation Performed:  Follow-Up Visit  Chief Complaint:  Yearly f/u  History of Present Illness:    Joel Williams is a 68 y.o. male with history of CAD status post DES to the LAD in 2012, repeat cath 07/2011 patent stent, abnormal Lexiscan followed by cardiac cath 06/14/13 patent LAD stent no disease in the small RCA or circumflex.  Echo 2017 normal LV function mild MR grade 2 DD.  Has chronic dyspnea on exertion, hypertension, HLD, IDDM.   I saw the patient 07/08/2017 at which time he was doing well.  I did recommend a weight loss program.  Patient says A1c is 9.8. Says he walks a lot and volunteers for his church-[employer].  Denies chest pain, dizziness or presyncope. Short of breath when walking uphill. Not new and hasn't changed in the past year or so. Doesn't have to stop.  The patient does not have symptoms concerning for COVID-19 infection (fever, chills, cough, or new shortness of breath).    Past Medical History:  Diagnosis Date  . ASTHMA 06/08/2008  . CAD (coronary artery disease)    a. s/p  Promus DES (3.0 x 16 mm) to mid LAD 05/16/10;  b. Myoview 05/15/10: anteroseptal ischemia with EF 52%;   c. Cath 05/16/10: single vessel CAD with mLAD 90% tx with PCI and EF 50%;  d. 07/2011 Cath:  Patent stent, nonobs dzs, NL EF.  Marland Kitchen Chronic kidney disease   . Colonic polyp   . DIABETES MELLITUS, TYPE II 06/08/2008  . GERD (gastroesophageal reflux disease)   . Hemorrhoids   . HYPERLIPIDEMIA 06/08/2008  . HYPERTENSION 06/08/2008  . Impotence of organic origin 08/24/2008  . Myocardial infarction ([place]) 04/2010  . Pneumonia    hx of   Past Surgical History:  Procedure Laterality Date  . CORONARY ANGIOPLASTY WITH STENT PLACEMENT    . LEFT HEART CATHETERIZATION WITH CORONARY ANGIOGRAM N/A 07/29/2011   Procedure: LEFT HEART CATHETERIZATION WITH CORONARY ANGIOGRAM;  Surgeon: Burnell Blanks, MD;  Location: [facility] CATH LAB;  Service: Cardiovascular;  Laterality: N/A;  . LEFT HEART CATHETERIZATION WITH CORONARY ANGIOGRAM N/A 06/14/2013   Procedure: LEFT HEART CATHETERIZATION WITH CORONARY ANGIOGRAM;  Surgeon: Burnell Blanks, MD;  Location: [facility] CATH LAB;  Service: Cardiovascular;  Laterality: N/A;     Current Meds  Medication Sig  . amLODipine (NORVASC) 10 MG tablet Take 1 tablet (10 mg total) by mouth daily.  . Ascorbic Acid (VITAMIN C) 1000 MG tablet Take 1,000 mg by mouth 2 (two) times daily.   Marland Kitchen aspirin 81 MG tablet Take 1 tablet (81 mg total) by mouth  daily.  . Biotin 1000 MCG tablet Take 1,000 mcg by mouth every evening.   . Cholecalciferol 1000 UNITS TBDP Take 1,000 Units by mouth daily.  . clopidogrel (PLAVIX) 75 MG tablet Take 75 mg by mouth daily.  Marland Kitchen glucagon (GLUCAGON EMERGENCY) 1 MG injection Inject 1 mg into the vein once as needed (Low blood sugar).   . Insulin Human (INSULIN PUMP) 100 unit/ml SOLN Inject into the skin continuous. Humalog; basal rate: 85-100 units per day.  . lisinopril (PRINIVIL,ZESTRIL) 40 MG tablet Take 1 tablet (40 mg total) by mouth daily.  . metFORMIN  (GLUCOPHAGE) 500 MG tablet Take 1,000 mg by mouth 2 (two) times daily with a meal.  . metoprolol succinate (TOPROL-XL) 50 MG 24 hr tablet TAKE 1 TABLET BY MOUTH TWICE DAILY WITH MEAL. PLEASE MAKE APPT 2ND ATTEMPT  . Multiple Vitamins-Minerals (ICAPS MV PO) Take 1 capsule by mouth every evening.  . nitroGLYCERIN (NITROSTAT) 0.4 MG SL tablet Place 0.4 mg under the tongue every 5 (five) minutes as needed for chest pain.   Marland Kitchen NOVOLIN R RELION 100 UNIT/ML injection USE AS DIRECTED VIA INSULIN PUMP UP TO 100 UNITS DAILY  . Omega-3 Fatty Acids (FISH OIL) 1200 MG CAPS Take 1,200 mg by mouth daily.  . simvastatin (ZOCOR) 20 MG tablet Take 1 tablet (20 mg total) by mouth daily at 6 PM.  . tadalafil (CIALIS) 20 MG tablet Take 20 mg by mouth every other day as needed for erectile dysfunction.  . vitamin E 400 UNIT capsule Take 400 Units by mouth every evening.  . [DISCONTINUED] furosemide (LASIX) 20 MG tablet Take 1 tablet (20 mg total) by mouth daily.  . [DISCONTINUED] mometasone (NASONEX) 50 MCG/ACT nasal spray Place 1 spray into the nose 2 (two) times daily as needed (allergies).     Allergies:   Patient has no known allergies.   Social History   Tobacco Use  . Smoking status: Former Smoker    Packs/day: 2.00    Years: 53.00    Pack years: 106.00    Types: Cigarettes    Last attempt to quit: 05/23/2002    Years since quitting: 16.2  . Smokeless tobacco: Never Used  Substance Use Topics  . Alcohol use: Yes    Alcohol/week: 14.0 standard drinks    Types: 14 Shots of liquor per week    Comment: one drink a day  . Drug use: No     Family Hx: The patient's family history includes Angina in his father; Dementia in his mother; Diabetes in his father; Heart disease in his father.  ROS:   Please see the history of present illness.    Review of Systems  Constitution: Negative.  HENT: Negative.   Cardiovascular: Negative.   Respiratory: Negative.   Endocrine: Negative.   Hematologic/Lymphatic:  Negative.   Musculoskeletal: Negative.   Gastrointestinal: Negative.   Genitourinary: Negative.   Neurological: Negative.     All other systems reviewed and are negative.   Prior CV studies:   The following studies were reviewed today: Cardiac catheterization 2015 Angiographic Findings:   Left main: No obstructive disease.    Left Anterior Descending Artery: Moderate to large caliber vessel that courses to the apex. The proximal stent is patent with no restenosis. The mid and distal vessel has no obstructive disease. There are two small caliber diagonal branches with no obstructive disease.    Circumflex Artery: Large caliber vessel with early intermediate branch followed by two moderate sized OM branches. No obstructive  disease noted.    Right Coronary Artery: Small non-dominant vessel with no obstructive disease noted.    Left Ventricular Angiogram: LVEF 50%.    Impression: 1. Single vessel CAD with patent mid LAD stent 2. Normal LV function   Recommendations: Continue medical management of CAD. I do not think his dyspnea is cardiac related.   2D echo 2017  study Conclusions   - Left ventricle: The cavity size was normal. There was mild focal   basal hypertrophy of the septum. Systolic function was normal.   The estimated ejection fraction was in the range of 60% to 65%.   Wall motion was normal; there were no regional wall motion   abnormalities. Features are consistent with a pseudonormal left   ventricular filling pattern, with concomitant abnormal relaxation   and increased filling pressure (grade 2 diastolic dysfunction). - Aortic valve: Trileaflet; mildly thickened, mildly calcified   leaflets. - Mitral valve: Calcified annulus. Mildly thickened leaflets .   There was mild regurgitation. - Left atrium: The atrium was mildly to moderately dilated.   Volume/bsa, ES, (1-plane Simpson&'s, A2C): 45.8 ml/m^2.    Labs/Other Tests and Data Reviewed:    EKG:  An ECG  dated 07/08/17 was personally reviewed today and demonstrated:  NSR with RBBB and left posterior fasicular block  Recent Labs: No results found for requested labs within last 8760 hours.   Recent Lipid Panel Lab Results  Component Value Date/Time   CHOL 140 06/12/2015   TRIG 250 (A) 06/12/2015   HDL 38 06/12/2015   CHOLHDL 4 06/21/2014 08:41 AM   LDLCALC 51 06/12/2015   LDLDIRECT 73.0 06/21/2014 08:41 AM    Wt Readings from Last 3 Encounters:  08/16/18 241 lb (109.3 kg)  04/12/18 249 lb 11.2 oz (113.3 kg)  09/28/17 250 lb (113.4 kg)     Objective:    Vital Signs:  Ht 5\' 10"  (1.778 m)   Wt 241 lb (109.3 kg)   BMI 34.58 kg/m  BP checked in the past month at his church and told it was good/normal.  VITAL SIGNS:  reviewed GEN:  no acute distress RESPIRATORY:  normal respiratory effort, symmetric expansion CARDIOVASCULAR:  no peripheral edema  ASSESSMENT & PLAN:    1. CAD with prior stent to the LAD last cath 06/14/2013 stable disease, normal LVEF on echo 83/4196 with diastolic dysfunction on Plavix, aspirin, statin and beta-blocker. Should have surveillance labs drawn-cmet/cbc/fasting lipid panel 2. Essential hypertension-has been controlled 3. Chronic diastolic CHF compensated 4. Hyperlipidemia on Zocor and fish oil.Marland Kitchen LDL 19 last year but triglycerides high. If they remain elevated recommend Vascepa 2 gm BID-to talk with Dr. Buddy Duty about this. 5. Obesity-150 min exercise weekly recommended, weight loss as well.  COVID-19 Education: The signs and symptoms of COVID-19 were discussed with the patient and how to seek care for testing (follow up with PCP or arrange E-visit).   The importance of social distancing was discussed today.  Time:   Today, I have spent 15 minutes with the patient with telehealth technology discussing the above problems.     Medication Adjustments/Labs and Tests Ordered: Current medicines are reviewed at length with the patient today.  Concerns regarding  medicines are outlined above.   Tests Ordered: No orders of the defined types were placed in this encounter.   Medication Changes: No orders of the defined types were placed in this encounter.   Disposition:  Follow up in 1 year(s) Dr. [name]  Signed, [name], PA-C  08/16/2018  8:50 AM    [facility]

## 2018-08-16 ENCOUNTER — Telehealth (INDEPENDENT_AMBULATORY_CARE_PROVIDER_SITE_OTHER): Payer: Medicare Other | Admitting: Physician Assistant

## 2018-08-16 ENCOUNTER — Other Ambulatory Visit: Payer: Self-pay

## 2018-08-16 ENCOUNTER — Encounter: Payer: Self-pay | Admitting: Physician Assistant

## 2018-08-16 ENCOUNTER — Telehealth: Payer: Self-pay

## 2018-08-16 VITALS — Ht 70.0 in | Wt 241.0 lb

## 2018-08-16 DIAGNOSIS — I5032 Chronic diastolic (congestive) heart failure: Secondary | ICD-10-CM

## 2018-08-16 DIAGNOSIS — I2510 Atherosclerotic heart disease of native coronary artery without angina pectoris: Secondary | ICD-10-CM

## 2018-08-16 DIAGNOSIS — E782 Mixed hyperlipidemia: Secondary | ICD-10-CM

## 2018-08-16 DIAGNOSIS — E669 Obesity, unspecified: Secondary | ICD-10-CM

## 2018-08-16 DIAGNOSIS — I10 Essential (primary) hypertension: Secondary | ICD-10-CM

## 2018-08-16 MED ORDER — CLOPIDOGREL BISULFATE 75 MG PO TABS: 75.0000 mg | ORAL_TABLET | Freq: Every day | ORAL | 3 refills | Status: DC

## 2018-08-16 MED ORDER — LISINOPRIL 40 MG PO TABS: 40.0000 mg | ORAL_TABLET | Freq: Every day | ORAL | 3 refills | Status: DC

## 2018-08-16 MED ORDER — AMLODIPINE BESYLATE 10 MG PO TABS: 10.0000 mg | ORAL_TABLET | Freq: Every day | ORAL | 3 refills | Status: DC

## 2018-08-16 MED ORDER — METOPROLOL SUCCINATE ER 50 MG PO TB24: ORAL_TABLET | ORAL | 0 refills | Status: DC

## 2018-08-16 NOTE — Patient Instructions (Addendum)
Medication Instructions:  Your physician recommends that you continue on your current medications as directed. Please refer to the Current Medication list given to you today.  If you need a refill on your cardiac medications before your next appointment, please call your pharmacy.   Lab work: None Ordered  If you have labs (blood work) drawn today and your tests are completely normal, you will receive your results only by: Marland Kitchen MyChart Message (if you have MyChart) OR . A paper copy in the mail If you have any lab test that is abnormal or we need to change your treatment, we will call you to review the results.  Testing/Procedures: None ordered  Follow-Up: At [facility], you and your health needs are our priority.  As part of our continuing mission to provide you with exceptional heart care, we have created designated Provider Care Teams.  These Care Teams include your primary Cardiologist (physician) and Advanced Practice Providers (APPs -  Physician Assistants and Nurse Practitioners) who all work together to provide you with the care you need, when you need it. . You will need a follow up appointment in 1 year.  Please call our office 2 months in advance to schedule this appointment.  You may see Darlina Guys, MD or one of the following Advanced Practice Providers on your designated Care Team:   . Lyda Jester, PA-C . Dayna Dunn, PA-C . [name], PA-C  Any Other Special Instructions Will Be Listed Below (If Applicable).  Your provider recommends that you maintain 150 minutes per week of moderate aerobic activity. Weight loss recommended.

## 2018-08-16 NOTE — Telephone Encounter (Signed)

## 2018-08-24 DIAGNOSIS — Z5181 Encounter for therapeutic drug level monitoring: Secondary | ICD-10-CM | POA: Diagnosis not present

## 2018-08-24 DIAGNOSIS — Z794 Long term (current) use of insulin: Secondary | ICD-10-CM | POA: Diagnosis not present

## 2018-08-24 DIAGNOSIS — E291 Testicular hypofunction: Secondary | ICD-10-CM | POA: Diagnosis not present

## 2018-08-24 DIAGNOSIS — I2510 Atherosclerotic heart disease of native coronary artery without angina pectoris: Secondary | ICD-10-CM | POA: Diagnosis not present

## 2018-08-24 DIAGNOSIS — E1165 Type 2 diabetes mellitus with hyperglycemia: Secondary | ICD-10-CM | POA: Diagnosis not present

## 2018-08-24 DIAGNOSIS — E1142 Type 2 diabetes mellitus with diabetic polyneuropathy: Secondary | ICD-10-CM | POA: Diagnosis not present

## 2018-08-25 DIAGNOSIS — E291 Testicular hypofunction: Secondary | ICD-10-CM | POA: Diagnosis not present

## 2018-08-25 DIAGNOSIS — Z79899 Other long term (current) drug therapy: Secondary | ICD-10-CM | POA: Diagnosis not present

## 2018-08-25 DIAGNOSIS — E1165 Type 2 diabetes mellitus with hyperglycemia: Secondary | ICD-10-CM | POA: Diagnosis not present

## 2018-08-25 DIAGNOSIS — E781 Pure hyperglyceridemia: Secondary | ICD-10-CM | POA: Diagnosis not present

## 2018-08-25 DIAGNOSIS — I2510 Atherosclerotic heart disease of native coronary artery without angina pectoris: Secondary | ICD-10-CM | POA: Diagnosis not present

## 2018-08-25 DIAGNOSIS — E1142 Type 2 diabetes mellitus with diabetic polyneuropathy: Secondary | ICD-10-CM | POA: Diagnosis not present

## 2018-12-06 DIAGNOSIS — Z23 Encounter for immunization: Secondary | ICD-10-CM | POA: Diagnosis not present

## 2018-12-07 ENCOUNTER — Other Ambulatory Visit: Payer: Self-pay | Admitting: Physician Assistant

## 2018-12-11 ENCOUNTER — Other Ambulatory Visit: Payer: Self-pay | Admitting: Family Medicine

## 2018-12-15 ENCOUNTER — Encounter: Payer: Self-pay | Admitting: Family Medicine

## 2018-12-22 DIAGNOSIS — Z794 Long term (current) use of insulin: Secondary | ICD-10-CM | POA: Diagnosis not present

## 2018-12-22 DIAGNOSIS — E1165 Type 2 diabetes mellitus with hyperglycemia: Secondary | ICD-10-CM | POA: Diagnosis not present

## 2018-12-22 DIAGNOSIS — Z5181 Encounter for therapeutic drug level monitoring: Secondary | ICD-10-CM | POA: Diagnosis not present

## 2018-12-22 DIAGNOSIS — I2510 Atherosclerotic heart disease of native coronary artery without angina pectoris: Secondary | ICD-10-CM | POA: Diagnosis not present

## 2018-12-22 DIAGNOSIS — E291 Testicular hypofunction: Secondary | ICD-10-CM | POA: Diagnosis not present

## 2018-12-22 DIAGNOSIS — E1142 Type 2 diabetes mellitus with diabetic polyneuropathy: Secondary | ICD-10-CM | POA: Diagnosis not present

## 2018-12-22 DIAGNOSIS — R748 Abnormal levels of other serum enzymes: Secondary | ICD-10-CM | POA: Diagnosis not present

## 2019-01-17 ENCOUNTER — Other Ambulatory Visit: Payer: Self-pay

## 2019-01-17 DIAGNOSIS — Z20828 Contact with and (suspected) exposure to other viral communicable diseases: Secondary | ICD-10-CM | POA: Diagnosis not present

## 2019-01-17 DIAGNOSIS — Z20822 Contact with and (suspected) exposure to covid-19: Secondary | ICD-10-CM

## 2019-01-18 LAB — NOVEL CORONAVIRUS, NAA: SARS-CoV-2, NAA: NOT DETECTED

## 2019-01-19 ENCOUNTER — Telehealth: Payer: Self-pay | Admitting: Family Medicine

## 2019-01-19 NOTE — Telephone Encounter (Signed)
Pt was given NEG Covid-19 results and expressed understanding.

## 2019-02-06 ENCOUNTER — Other Ambulatory Visit: Payer: Self-pay

## 2019-04-14 DIAGNOSIS — Z23 Encounter for immunization: Secondary | ICD-10-CM | POA: Diagnosis not present

## 2019-05-12 DIAGNOSIS — Z23 Encounter for immunization: Secondary | ICD-10-CM | POA: Diagnosis not present

## 2019-06-09 ENCOUNTER — Other Ambulatory Visit: Payer: Self-pay | Admitting: Family Medicine

## 2019-06-21 IMAGING — DX DG CERVICAL SPINE COMPLETE 4+V
6 series · 6 of 6 positions shown · non-contrast
Comparison: None.

CLINICAL DATA: Left-sided neck pain and left upper extremity
weakness.

EXAM:
CERVICAL SPINE - COMPLETE 4+ VIEW

[cervical spine ap (1 of 3)]
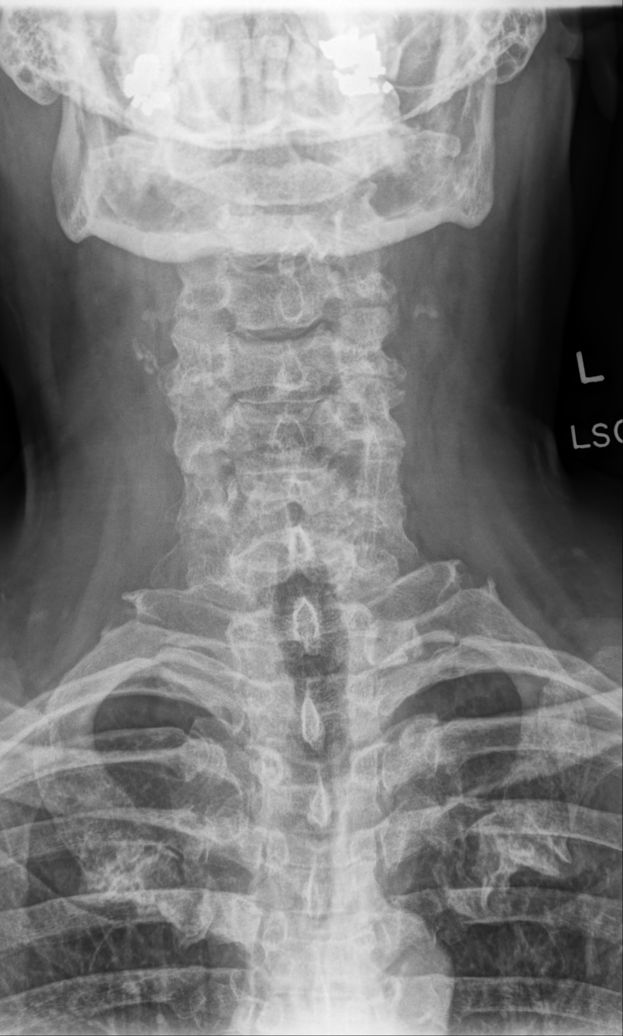

[cervical spine lat]
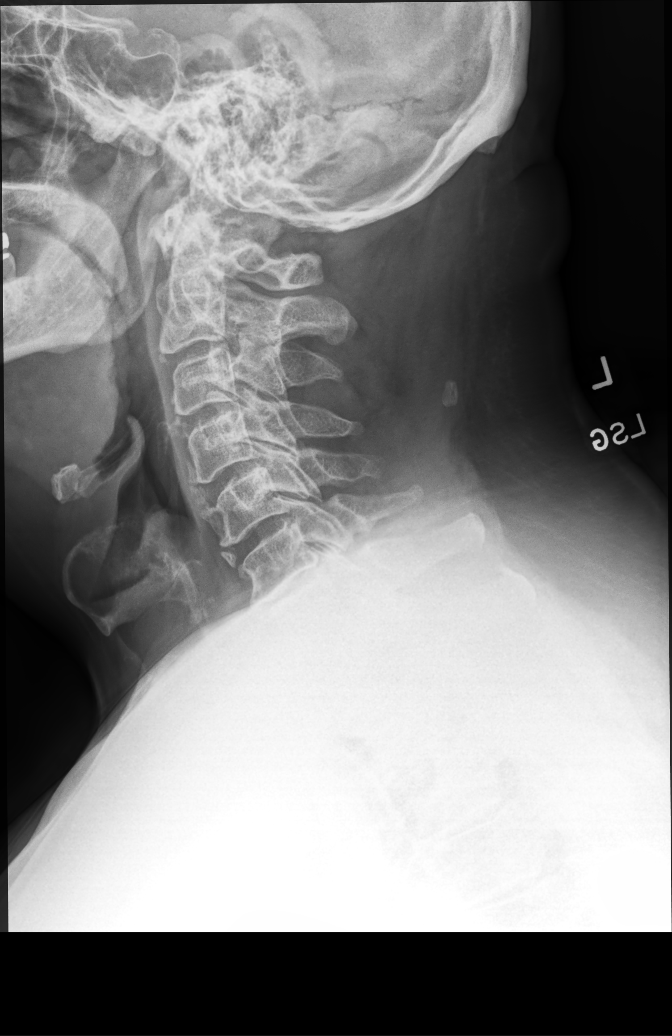

[cervical spine ap (2 of 3)]
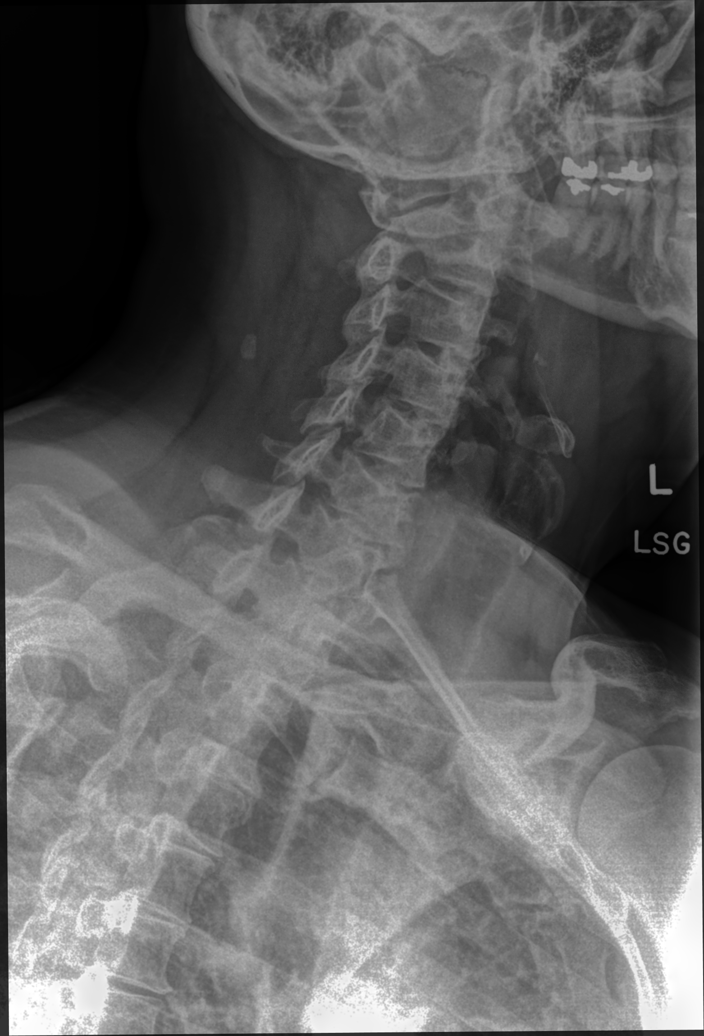

[cervical spine ap (3 of 3)]
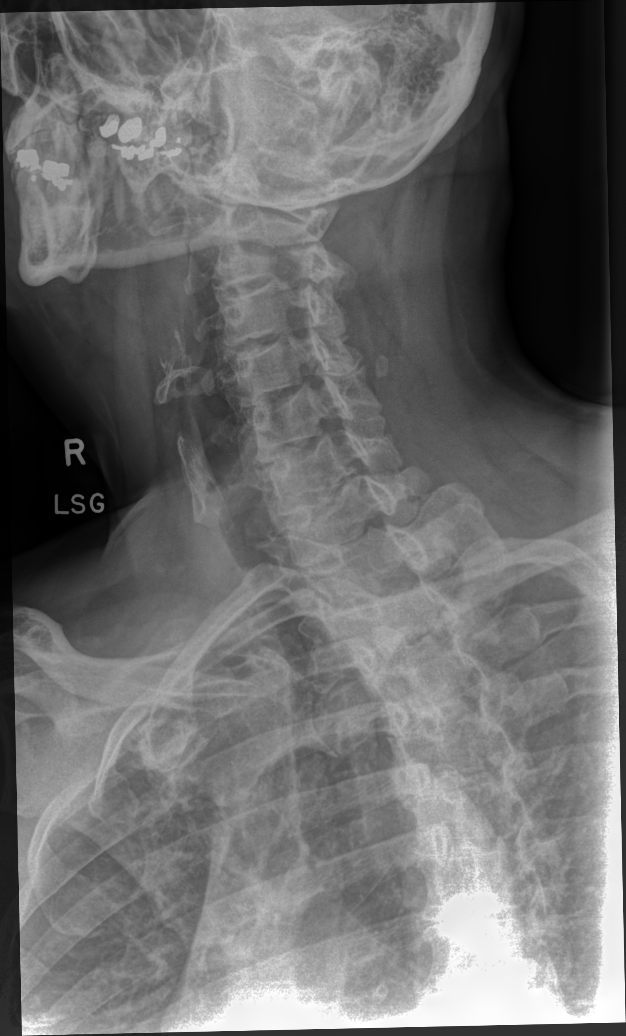

[cervical spine open mouth ap]
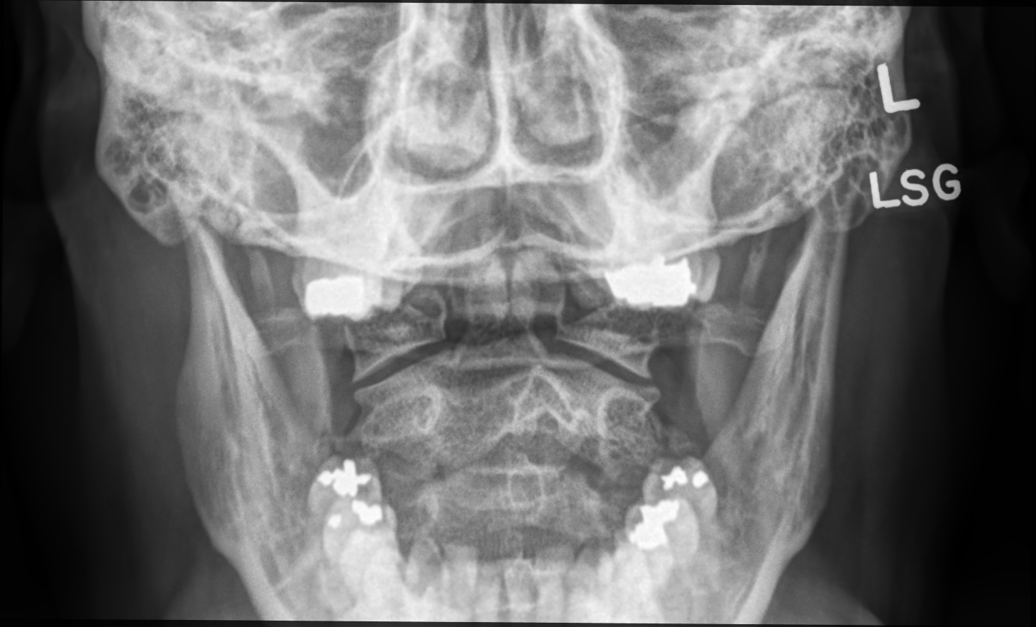

[cervico-thoracic (swimmers) lat]
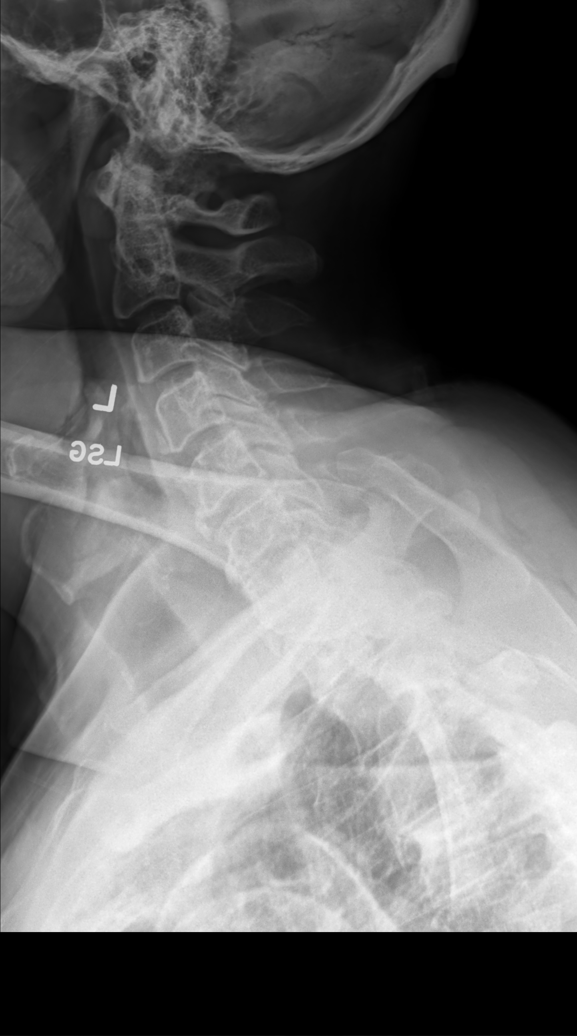

[6 of 6 positions shown; findings below may reference images not displayed]

FINDINGS: No fracture or spondylolisthesis is noted. Mild degenerative disc
disease is noted at C5-6 and C6-7 with anterior osteophyte
formation. Mild bilateral neural foraminal stenosis is noted at
these levels secondary to uncovertebral spurring. No prevertebral
soft tissue swelling is noted.
IMPRESSION: Mild multilevel degenerative disc disease. No acute abnormality seen
in the cervical spine.

## 2019-07-17 DIAGNOSIS — I2510 Atherosclerotic heart disease of native coronary artery without angina pectoris: Secondary | ICD-10-CM | POA: Diagnosis not present

## 2019-07-17 DIAGNOSIS — Z5181 Encounter for therapeutic drug level monitoring: Secondary | ICD-10-CM | POA: Diagnosis not present

## 2019-07-17 DIAGNOSIS — E291 Testicular hypofunction: Secondary | ICD-10-CM | POA: Diagnosis not present

## 2019-07-17 DIAGNOSIS — E1142 Type 2 diabetes mellitus with diabetic polyneuropathy: Secondary | ICD-10-CM | POA: Diagnosis not present

## 2019-07-17 DIAGNOSIS — E1165 Type 2 diabetes mellitus with hyperglycemia: Secondary | ICD-10-CM | POA: Diagnosis not present

## 2019-07-17 DIAGNOSIS — Z794 Long term (current) use of insulin: Secondary | ICD-10-CM | POA: Diagnosis not present

## 2019-08-13 ENCOUNTER — Other Ambulatory Visit: Payer: Self-pay | Admitting: Physician Assistant

## 2019-08-15 ENCOUNTER — Other Ambulatory Visit: Payer: Self-pay | Admitting: Physician Assistant

## 2019-09-06 ENCOUNTER — Other Ambulatory Visit: Payer: Self-pay | Admitting: Physician Assistant

## 2019-09-09 ENCOUNTER — Other Ambulatory Visit: Payer: Self-pay | Admitting: Family Medicine

## 2019-09-11 ENCOUNTER — Telehealth: Payer: Self-pay | Admitting: Family Medicine

## 2019-09-11 ENCOUNTER — Telehealth: Payer: Self-pay

## 2019-09-11 NOTE — Telephone Encounter (Signed)
Left message for patient to schedule Annual Wellness Visit.  Please schedule with Nurse Health Advisor Shannon Crews, RN at [facility]  

## 2019-09-11 NOTE — Telephone Encounter (Signed)
lvm for pt to call back pt needs an appt

## 2019-09-13 ENCOUNTER — Other Ambulatory Visit: Payer: Self-pay | Admitting: Cardiovascular Disease

## 2019-10-11 ENCOUNTER — Other Ambulatory Visit: Payer: Self-pay | Admitting: Cardiovascular Disease

## 2019-10-11 ENCOUNTER — Other Ambulatory Visit: Payer: Self-pay | Admitting: Family Medicine

## 2019-10-22 ENCOUNTER — Other Ambulatory Visit: Payer: Self-pay | Admitting: Cardiovascular Disease

## 2019-10-22 ENCOUNTER — Other Ambulatory Visit: Payer: Self-pay | Admitting: Family Medicine

## 2019-10-23 DIAGNOSIS — I2510 Atherosclerotic heart disease of native coronary artery without angina pectoris: Secondary | ICD-10-CM | POA: Diagnosis not present

## 2019-10-23 DIAGNOSIS — E291 Testicular hypofunction: Secondary | ICD-10-CM | POA: Diagnosis not present

## 2019-10-23 DIAGNOSIS — Z794 Long term (current) use of insulin: Secondary | ICD-10-CM | POA: Diagnosis not present

## 2019-10-23 DIAGNOSIS — E1165 Type 2 diabetes mellitus with hyperglycemia: Secondary | ICD-10-CM | POA: Diagnosis not present

## 2019-10-23 DIAGNOSIS — E1142 Type 2 diabetes mellitus with diabetic polyneuropathy: Secondary | ICD-10-CM | POA: Diagnosis not present

## 2019-11-05 ENCOUNTER — Other Ambulatory Visit: Payer: Self-pay | Admitting: Family Medicine

## 2019-11-06 ENCOUNTER — Other Ambulatory Visit: Payer: Self-pay | Admitting: Physician Assistant

## 2019-11-06 NOTE — Telephone Encounter (Signed)
Patient need to schedule an ov for more refills. 

## 2019-11-28 ENCOUNTER — Other Ambulatory Visit: Payer: Self-pay

## 2019-11-29 ENCOUNTER — Encounter: Payer: Self-pay | Admitting: Family Medicine

## 2019-11-29 ENCOUNTER — Ambulatory Visit (INDEPENDENT_AMBULATORY_CARE_PROVIDER_SITE_OTHER): Payer: Medicare Other | Admitting: Family Medicine

## 2019-11-29 VITALS — BP 120/60 | HR 78 | Temp 97.8°F | Ht 70.0 in | Wt 247.0 lb

## 2019-11-29 DIAGNOSIS — Z87891 Personal history of nicotine dependence: Secondary | ICD-10-CM | POA: Diagnosis not present

## 2019-11-29 DIAGNOSIS — Z1211 Encounter for screening for malignant neoplasm of colon: Secondary | ICD-10-CM

## 2019-11-29 DIAGNOSIS — Z0000 Encounter for general adult medical examination without abnormal findings: Secondary | ICD-10-CM | POA: Diagnosis not present

## 2019-11-29 DIAGNOSIS — Z136 Encounter for screening for cardiovascular disorders: Secondary | ICD-10-CM | POA: Diagnosis not present

## 2019-11-29 NOTE — Patient Instructions (Signed)

## 2019-11-29 NOTE — Progress Notes (Signed)
Established Patient Office Visit  Subjective:  Patient ID: Joel Williams, male    DOB: 01-11-1951  Age: 69 y.o. MRN: [id number]  CC:  Chief Complaint  Patient presents with  . Follow-up    HPI Joel Williams presents for Medicare subsequent annual wellness visit.  He has history of CAD, hypertension, dyslipidemia, type 2 diabetes.  He has insulin pump.  He is followed by endocrinology and they have been monitoring all his labs.  He has had Covid vaccine.  He plans to get booster when available.  Recent blood sugars have been out of control.  1.  Risk factors based on Past Medical , Social, and Family history reviewed and as indicated above with no changes  2.  Limitations in physical activities None.  No recent falls.  Does feel like he has had some occasional balance issues when he turns around quickly when standing but has not had any recent falls.  3.  Depression/mood No active depression or anxiety issues PHQ 2 equals 0  4.  Hearing No major deficits.  He does have some chronic tinnitus.  No recent hearing assessment.  5.  ADLs independent in all.  6.  Cognitive function (orientation to time and place, language, writing, speech,memory) no short or long term memory issues.  Language and judgement intact.  7.  Home Safety no issues  8.  Height, weight, and visual acuity.all stable. He has scheduled eye exam next week Weight is relatively stable Wt Readings from Last 3 Encounters:  11/29/19 247 lb (112 kg)  08/16/18 241 lb (109.3 kg)  04/12/18 249 lb 11.2 oz (113.3 kg)    9.  Counseling discussed  Counseled regarding age and gender appropriate preventative screenings and immunizations. He has had previous Zostavax but apparently not Shingrix.  10. Recommendation of preventive services. -We recommend he consider Shingrix at some point this year -Discussed abdominal aortic aneurysm screening.  He meets criteria as he previously smoked and given his age of 56  should meet criteria -Has previously been on low-dose CT lung cancer screening program.  No screening since 2019.  He would like to get back into that program -Last colonoscopy 5 years ago with reported 5-year follow-up recommendation  11. Labs based on risk factors-none.  This is being screened through endocrinology  12. Care Plan-as below  13. Other Providers-Dr. Dagmar Hait endocrinology  14. Written schedule of screening/prevention services given to patient. Health Maintenance  Topic Date Due  . FOOT EXAM  Never done  . HEMOGLOBIN A1C  03/28/2017  . OPHTHALMOLOGY EXAM  05/13/2018  . COLONOSCOPY  04/10/2019  . TETANUS/TDAP  07/04/2025  . INFLUENZA VACCINE  Completed  . COVID-19 Vaccine  Completed  . Hepatitis C Screening  Completed  . PNA vac Low Risk Adult  Completed      Past Medical History:  Diagnosis Date  . ASTHMA 06/08/2008  . CAD (coronary artery disease)    a. s/p Promus DES (3.0 x 16 mm) to mid LAD 05/16/10;  b. Myoview 05/15/10: anteroseptal ischemia with EF 52%;   c. Cath 05/16/10: single vessel CAD with mLAD 90% tx with PCI and EF 50%;  d. 07/2011 Cath:  Patent stent, nonobs dzs, NL EF.  Marland Kitchen Chronic kidney disease   . Colonic polyp   . DIABETES MELLITUS, TYPE II 06/08/2008  . GERD (gastroesophageal reflux disease)   . Hemorrhoids   . HYPERLIPIDEMIA 06/08/2008  . HYPERTENSION 06/08/2008  . Impotence of organic origin 08/24/2008  .  Myocardial infarction ([place]) 04/2010  . Pneumonia    hx of    Past Surgical History:  Procedure Laterality Date  . CORONARY ANGIOPLASTY WITH STENT PLACEMENT    . LEFT HEART CATHETERIZATION WITH CORONARY ANGIOGRAM N/A 07/29/2011   Procedure: LEFT HEART CATHETERIZATION WITH CORONARY ANGIOGRAM;  Surgeon: Burnell Blanks, MD;  Location: [facility] CATH LAB;  Service: Cardiovascular;  Laterality: N/A;  . LEFT HEART CATHETERIZATION WITH CORONARY ANGIOGRAM N/A 06/14/2013   Procedure: LEFT HEART CATHETERIZATION WITH CORONARY ANGIOGRAM;  Surgeon:  Burnell Blanks, MD;  Location: [facility] CATH LAB;  Service: Cardiovascular;  Laterality: N/A;    Family History  Problem Relation Age of Onset  . Diabetes Father   . Heart disease Father   . Angina Father   . Dementia Mother     Social History   Socioeconomic History  . Marital status: Married    Spouse name: Not on file  . Number of children: 2  . Years of education: Not on file  . Highest education level: Not on file  Occupational History  . Occupation: Sales  Tobacco Use  . Smoking status: Former Smoker    Packs/day: 2.00    Years: 53.00    Pack years: 106.00    Types: Cigarettes    Quit date: 05/23/2002    Years since quitting: 17.5  . Smokeless tobacco: Never Used  Vaping Use  . Vaping Use: Never used  Substance and Sexual Activity  . Alcohol use: Yes    Alcohol/week: 14.0 standard drinks    Types: 14 Shots of liquor per week    Comment: one drink a day  . Drug use: No  . Sexual activity: Not Currently  Other Topics Concern  . Not on file  Social History Narrative  . Not on file   Social Determinants of Health   Financial Resource Strain:   . Difficulty of Paying Living Expenses: Not on file  Food Insecurity:   . Worried About Charity fundraiser in the Last Year: Not on file  . Ran Out of Food in the Last Year: Not on file  Transportation Needs:   . Lack of Transportation (Medical): Not on file  . Lack of Transportation (Non-Medical): Not on file  Physical Activity:   . Days of Exercise per Week: Not on file  . Minutes of Exercise per Session: Not on file  Stress:   . Feeling of Stress : Not on file  Social Connections:   . Frequency of Communication with Friends and Family: Not on file  . Frequency of Social Gatherings with Friends and Family: Not on file  . Attends Religious Services: Not on file  . Active Member of Clubs or Organizations: Not on file  . Attends Archivist Meetings: Not on file  . Marital Status: Not on file    Intimate Partner Violence:   . Fear of Current or Ex-Partner: Not on file  . Emotionally Abused: Not on file  . Physically Abused: Not on file  . Sexually Abused: Not on file    Outpatient Medications Prior to Visit  Medication Sig Dispense Refill  . amLODipine (NORVASC) 10 MG tablet Take 1 tablet (10 mg total) by mouth daily. Please make overdue appt with Dr. [name] before anymore refills. 1st attempt 30 tablet 0  . Ascorbic Acid (VITAMIN C) 1000 MG tablet Take 1,000 mg by mouth 2 (two) times daily.     Marland Kitchen aspirin 81 MG tablet Take 1 tablet (81 mg  total) by mouth daily. 30 tablet 0  . Biotin 1000 MCG tablet Take 1,000 mcg by mouth every evening.     . Cholecalciferol 1000 UNITS TBDP Take 1,000 Units by mouth daily.    . clopidogrel (PLAVIX) 75 MG tablet Take 1 tablet (75 mg total) by mouth daily. Please make overdue appt with Dr. [name] before anymore refills. 1st attempt 30 tablet 0  . glucagon (GLUCAGON EMERGENCY) 1 MG injection Inject 1 mg into the vein once as needed (Low blood sugar).     . Insulin Human (INSULIN PUMP) 100 unit/ml SOLN Inject into the skin continuous. Humalog; basal rate: 85-100 units per day.    . lisinopril (ZESTRIL) 40 MG tablet Take 1 tablet (40 mg total) by mouth daily. Please call and schedule overdue follow up appt for further refills [phone] 30 tablet 0  . metFORMIN (GLUCOPHAGE) 500 MG tablet Take 1,000 mg by mouth 2 (two) times daily with a meal.    . metoprolol succinate (TOPROL-XL) 50 MG 24 hr tablet TAKE 1 TABLET BY MOUTH TWICE DAILY WITH MEAL. Please make overdue appt with Dr. [name] before anymore refills. 2nd attempt 30 tablet 0  . Multiple Vitamins-Minerals (ICAPS MV PO) Take 1 capsule by mouth every evening.    . nitroGLYCERIN (NITROSTAT) 0.4 MG SL tablet Place 0.4 mg under the tongue every 5 (five) minutes as needed for chest pain.     Marland Kitchen NOVOLIN R RELION 100 UNIT/ML injection USE AS DIRECTED VIA INSULIN PUMP UP TO 100 UNITS DAILY    .  Omega-3 Fatty Acids (FISH OIL) 1200 MG CAPS Take 1,200 mg by mouth daily.    . simvastatin (ZOCOR) 20 MG tablet TAKE 1 TABLET (20 MG TOTAL) BY MOUTH DAILY AT 6 PM. NEEDS OFFICE VISIT FOR FURTHER REFILLS 15 tablet 0  . tadalafil (CIALIS) 20 MG tablet Take 20 mg by mouth every other day as needed for erectile dysfunction.    . vitamin E 400 UNIT capsule Take 400 Units by mouth every evening.     No facility-administered medications prior to visit.    No Known Allergies  ROS Review of Systems  Constitutional: Negative for appetite change, fever and unexpected weight change.  Respiratory: Negative for cough.   Cardiovascular: Negative for chest pain.  Genitourinary: Negative for difficulty urinating and dysuria.  Neurological: Negative for dizziness.      Objective:    Physical Exam Vitals reviewed.  Neck:     Comments: No bruits auscultated.   Cardiovascular:     Rate and Rhythm: Normal rate and regular rhythm.  Pulmonary:     Effort: Pulmonary effort is normal.     Breath sounds: Normal breath sounds.  Musculoskeletal:     Right lower leg: No edema.     Left lower leg: No edema.  Neurological:     General: No focal deficit present.     Cranial Nerves: No cranial nerve deficit.     Comments: Romberg normal.      BP 120/60   Pulse 78   Temp 97.8 F (36.6 C) (Oral)   Ht 5\' 10"  (1.778 m)   Wt 247 lb (112 kg)   SpO2 94%   BMI 35.44 kg/m  Wt Readings from Last 3 Encounters:  11/29/19 247 lb (112 kg)  08/16/18 241 lb (109.3 kg)  04/12/18 249 lb 11.2 oz (113.3 kg)     Health Maintenance Due  Topic Date Due  . FOOT EXAM  Never done  . HEMOGLOBIN A1C  03/28/2017  . OPHTHALMOLOGY EXAM  05/13/2018  . COLONOSCOPY  04/10/2019    There are no preventive care reminders to display for this patient.  Lab Results  Component Value Date   TSH 1.16 06/12/2015   Lab Results  Component Value Date   WBC 3.7 09/25/2016   HGB 14.3 09/25/2016   HCT 41 09/25/2016   MCV  85.9 06/21/2014   PLT 137 (A) 09/25/2016   Lab Results  Component Value Date   NA 137 07/16/2017   K 4.4 07/16/2017   CO2 19 (L) 02/24/2016   GLUCOSE 127 (H) 02/24/2016   BUN 16 07/16/2017   CREATININE 0.8 07/16/2017   BILITOT 0.7 06/21/2014   ALKPHOS 47 09/25/2016   AST 32 07/16/2017   ALT 46 (A) 07/16/2017   PROT 6.9 06/21/2014   ALBUMIN 4.2 06/21/2014   CALCIUM 9.6 02/24/2016   GFR 126.49 06/21/2014   Lab Results  Component Value Date   CHOL 140 06/12/2015   Lab Results  Component Value Date   HDL 38 06/12/2015   Lab Results  Component Value Date   LDLCALC 51 06/12/2015   Lab Results  Component Value Date   TRIG 250 (A) 06/12/2015   Lab Results  Component Value Date   CHOLHDL 4 06/21/2014   Lab Results  Component Value Date   HGBA1C 10.0 09/25/2016      Assessment & Plan:   Medicare subsequent annual wellness visit.  -Recommend flu vaccine and he just got this last week and through his church -He will consider Shingrix later this year -Covid vaccine already given and recommend booster when available. -Set up repeat colonoscopy -We discussed abdominal aortic aneurysm screening for men 65-75 with past history of smoking -We discussed getting back into CT lung cancer screening  No orders of the defined types were placed in this encounter.   Follow-up: No follow-ups on file.    Carolann Littler, MD

## 2019-12-03 ENCOUNTER — Other Ambulatory Visit: Payer: Self-pay | Admitting: Cardiovascular Disease

## 2019-12-05 DIAGNOSIS — H524 Presbyopia: Secondary | ICD-10-CM | POA: Diagnosis not present

## 2019-12-05 DIAGNOSIS — H02122 Mechanical ectropion of right lower eyelid: Secondary | ICD-10-CM | POA: Diagnosis not present

## 2019-12-05 DIAGNOSIS — E119 Type 2 diabetes mellitus without complications: Secondary | ICD-10-CM | POA: Diagnosis not present

## 2019-12-05 DIAGNOSIS — H35033 Hypertensive retinopathy, bilateral: Secondary | ICD-10-CM | POA: Diagnosis not present

## 2019-12-05 DIAGNOSIS — H52221 Regular astigmatism, right eye: Secondary | ICD-10-CM | POA: Diagnosis not present

## 2019-12-05 DIAGNOSIS — H5213 Myopia, bilateral: Secondary | ICD-10-CM | POA: Diagnosis not present

## 2019-12-05 DIAGNOSIS — Z23 Encounter for immunization: Secondary | ICD-10-CM | POA: Diagnosis not present

## 2019-12-08 ENCOUNTER — Other Ambulatory Visit: Payer: Self-pay

## 2019-12-08 MED ORDER — CLOPIDOGREL BISULFATE 75 MG PO TABS: ORAL_TABLET | ORAL | 1 refills | Status: DC

## 2019-12-08 MED ORDER — AMLODIPINE BESYLATE 10 MG PO TABS: ORAL_TABLET | ORAL | 1 refills | Status: DC

## 2019-12-08 NOTE — Telephone Encounter (Signed)
Pt calling stating that he needs an refill on his medication amlodipine and clopidogrel. Pt has an upcoming in February 2022 with Dr. [name]. Would Dr. [name] like to refill these medication until pt's appt next year? Pt has not been seen since 08/2018. Please address

## 2019-12-27 ENCOUNTER — Other Ambulatory Visit: Payer: Self-pay

## 2019-12-27 ENCOUNTER — Ambulatory Visit (HOSPITAL_COMMUNITY)
Admission: RE | Admit: 2019-12-27 | Discharge: 2019-12-27 | Disposition: A | Discharge status: Home / Self Care | Payer: Medicare Other | Source: Ambulatory Visit | Attending: Cardiology | Admitting: Cardiology

## 2019-12-27 DIAGNOSIS — Z87891 Personal history of nicotine dependence: Secondary | ICD-10-CM | POA: Diagnosis not present

## 2019-12-27 DIAGNOSIS — Z136 Encounter for screening for cardiovascular disorders: Secondary | ICD-10-CM | POA: Diagnosis not present

## 2019-12-29 ENCOUNTER — Other Ambulatory Visit: Payer: Self-pay | Admitting: Cardiovascular Disease

## 2020-01-11 DIAGNOSIS — Z23 Encounter for immunization: Secondary | ICD-10-CM | POA: Diagnosis not present

## 2020-01-23 DIAGNOSIS — I2510 Atherosclerotic heart disease of native coronary artery without angina pectoris: Secondary | ICD-10-CM | POA: Diagnosis not present

## 2020-01-23 DIAGNOSIS — E1142 Type 2 diabetes mellitus with diabetic polyneuropathy: Secondary | ICD-10-CM | POA: Diagnosis not present

## 2020-01-23 DIAGNOSIS — E1165 Type 2 diabetes mellitus with hyperglycemia: Secondary | ICD-10-CM | POA: Diagnosis not present

## 2020-01-23 DIAGNOSIS — E291 Testicular hypofunction: Secondary | ICD-10-CM | POA: Diagnosis not present

## 2020-01-23 DIAGNOSIS — Z794 Long term (current) use of insulin: Secondary | ICD-10-CM | POA: Diagnosis not present

## 2020-04-01 ENCOUNTER — Other Ambulatory Visit: Payer: Self-pay | Admitting: Cardiovascular Disease

## 2020-04-01 ENCOUNTER — Other Ambulatory Visit: Payer: Self-pay | Admitting: Family Medicine

## 2020-04-18 NOTE — Progress Notes (Signed)
Chief Complaint  Patient presents with  . Follow-up    CAD   History of Present Illness: 70 yo male with a history of CAD, DM, HTN, hyperlipidemia who is here today for cardiac follow up. Cardiac catheterization in 2012 demonstrated single vessel CAD with a mid LAD stenosis of 90%. This was treated with a 3.0 x 16 mm Promus Element DES. 48 hour Holter monitor 2012 with sinus, rare PVCs and PACs. Ad[facility] to [facility] 2013 with chest pain. Cardiac cath May 2013 with stable CAD, patent LAD stent. Lexiscan stress myoview 05/30/13 with LVEF=50%, possible inferior wall ischemia. Cardiac cath 06/14/13 with patent LAD stent and no disease in the small RCA or Circumflex. Echo 01/27/16 with normal LV systolic function, mild MR. Grade 2 diastolic dysfunction. He has chronic dyspnea. No AAA by u/s October 2021.   He is here today for follow up. The patient denies any chest pain, dyspnea, palpitations, lower extremity edema, orthopnea, PND, dizziness, near syncope or syncope.   Primary care: [name], MD  Past Medical History:  Diagnosis Date  . ASTHMA 06/08/2008  . CAD (coronary artery disease)    a. s/p Promus DES (3.0 x 16 mm) to mid LAD 05/16/10;  b. Myoview 05/15/10: anteroseptal ischemia with EF 52%;   c. Cath 05/16/10: single vessel CAD with mLAD 90% tx with PCI and EF 50%;  d. 07/2011 Cath:  Patent stent, nonobs dzs, NL EF.  Marland Kitchen Chronic kidney disease   . Colonic polyp   . DIABETES MELLITUS, TYPE II 06/08/2008  . GERD (gastroesophageal reflux disease)   . Hemorrhoids   . HYPERLIPIDEMIA 06/08/2008  . HYPERTENSION 06/08/2008  . Impotence of organic origin 08/24/2008  . Myocardial infarction ([place]) 04/2010  . Pneumonia    hx of    Past Surgical History:  Procedure Laterality Date  . CORONARY ANGIOPLASTY WITH STENT PLACEMENT    . LEFT HEART CATHETERIZATION WITH CORONARY ANGIOGRAM N/A 07/29/2011   Procedure: LEFT HEART CATHETERIZATION WITH CORONARY ANGIOGRAM;  Surgeon: Burnell Blanks, MD;   Location[facility] CATH LAB;  Service: Cardiovascular;  Laterality: N/A;  . LEFT HEART CATHETERIZATION WITH CORONARY ANGIOGRAM N/A 06/14/2013   Procedure: LEFT HEART CATHETERIZATION WITH CORONARY ANGIOGRAM;  Surgeon: Burnell Blanks, MD;  Location: [facility] CATH LAB;  Service: Cardiovascular;  Laterality: N/A;    Current Outpatient Medications  Medication Sig Dispense Refill  . Magnesium 500 MG TABS Take 1 tablet by mouth daily.    . Zinc 22.5 MG TABS Take 1 tablet by mouth in the morning and at bedtime.    Marland Kitchen amLODipine (NORVASC) 10 MG tablet TAKE 1 TABLET BY MOUTH DAILY. 90 tablet 3  . Ascorbic Acid (VITAMIN C) 1000 MG tablet Take 1,000 mg by mouth 2 (two) times daily.     Marland Kitchen aspirin 81 MG tablet Take 1 tablet (81 mg total) by mouth daily. 30 tablet 0  . Biotin 1000 MCG tablet Take 1,000 mcg by mouth every evening.     . Cholecalciferol 1000 UNITS TBDP Take 1,000 Units by mouth daily.    . clopidogrel (PLAVIX) 75 MG tablet TAKE 1 TABLET BY MOUTH DAILY. 90 tablet 3  . glucagon (GLUCAGON EMERGENCY) 1 MG injection Inject 1 mg into the vein once as needed (Low blood sugar).     . Insulin Human (INSULIN PUMP) 100 unit/ml SOLN Inject into the skin continuous. Humalog; basal rate: 85-100 units per day.    . lisinopril (ZESTRIL) 40 MG tablet Take 1 tablet (40  mg total) by mouth daily. 90 tablet 3  . metFORMIN (GLUCOPHAGE) 500 MG tablet Take 1,000 mg by mouth 2 (two) times daily with a meal.    . metoprolol succinate (TOPROL-XL) 50 MG 24 hr tablet Take with or immediately following a meal. 90 tablet 3  . Multiple Vitamins-Minerals (ICAPS MV PO) Take 1 capsule by mouth every evening.    . nitroGLYCERIN (NITROSTAT) 0.4 MG SL tablet Place 0.4 mg under the tongue every 5 (five) minutes as needed for chest pain.     Marland Kitchen NOVOLIN R RELION 100 UNIT/ML injection USE AS DIRECTED VIA INSULIN PUMP UP TO 100 UNITS DAILY    . Omega-3 Fatty Acids (FISH OIL) 1200 MG CAPS Take 1,200 mg by mouth daily.    . simvastatin (ZOCOR)  20 MG tablet Take 1 tablet (20 mg total) by mouth daily. 90 tablet 3  . tadalafil (CIALIS) 20 MG tablet Take 20 mg by mouth every other day as needed for erectile dysfunction.    . vitamin E 400 UNIT capsule Take 400 Units by mouth every evening.     No current facility-administered medications for this visit.    No Known Allergies  Social History   Socioeconomic History  . Marital status: Married    Spouse name: Not on file  . Number of children: 2  . Years of education: Not on file  . Highest education level: Not on file  Occupational History  . Occupation: Sales  Tobacco Use  . Smoking status: Former Smoker    Packs/day: 2.00    Years: 53.00    Pack years: 106.00    Types: Cigarettes    Quit date: 05/23/2002    Years since quitting: 17.9  . Smokeless tobacco: Never Used  Vaping Use  . Vaping Use: Never used  Substance and Sexual Activity  . Alcohol use: Yes    Alcohol/week: 14.0 standard drinks    Types: 14 Shots of liquor per week    Comment: one drink a day  . Drug use: No  . Sexual activity: Not Currently  Other Topics Concern  . Not on file  Social History Narrative  . Not on file   Social Determinants of Health   Financial Resource Strain: Not on file  Food Insecurity: Not on file  Transportation Needs: Not on file  Physical Activity: Not on file  Stress: Not on file  Social Connections: Not on file  Intimate Partner Violence: Not on file    Family History  Problem Relation Age of Onset  . Diabetes Father   . Heart disease Father   . Angina Father   . Dementia Mother     Review of Systems:  As stated in the HPI and otherwise negative.   BP (!) 142/72   Pulse 73   Ht 5\' 10"  (1.778 m)   Wt 250 lb 6.4 oz (113.6 kg)   SpO2 95%   BMI 35.93 kg/m   Physical Examination: General: Well developed, well nourished, NAD  HEENT: OP clear, mucus membranes moist  SKIN: warm, dry. No rashes. Neuro: No focal deficits  Musculoskeletal: Muscle strength  5/5 all ext  Psychiatric: Mood and affect normal  Neck: No JVD, no carotid bruits, no thyromegaly, no lymphadenopathy.  Lungs:Clear bilaterally, no wheezes, rhonci, crackles Cardiovascular: Regular rate and rhythm. No murmurs, gallops or rubs. Abdomen:Soft. Bowel sounds present. Non-tender.  Extremities: No lower extremity edema. Pulses are 2 + in the bilateral DP/PT.  Echo 01/27/16: - Left ventricle: The  cavity size was normal. There was mild focal   basal hypertrophy of the septum. Systolic function was normal.   The estimated ejection fraction was in the range of 60% to 65%.   Wall motion was normal; there were no regional wall motion   abnormalities. Features are consistent with a pseudonormal left   ventricular filling pattern, with concomitant abnormal relaxation   and increased filling pressure (grade 2 diastolic dysfunction). - Aortic valve: Trileaflet; mildly thickened, mildly calcified   leaflets. - Mitral valve: Calcified annulus. Mildly thickened leaflets .   There was mild regurgitation. - Left atrium: The atrium was mildly to moderately dilated.   Volume/bsa, ES, (1-plane Simpson&'s, A2C): 45.8 ml/m^2.  Cardiac cath 06/14/13: Left main: No obstructive disease.  Left Anterior Descending Artery: Moderate to large caliber vessel that courses to the apex. The proximal stent is patent with no restenosis. The mid and distal vessel has no obstructive disease. There are two small caliber diagonal branches with no obstructive disease.  Circumflex Artery: Large caliber vessel with early intermediate branch followed by two moderate sized OM branches. No obstructive disease noted.  Right Coronary Artery: Small non-dominant vessel with no obstructive disease noted.  Left Ventricular Angiogram: LVEF 50%.  Impression:  1. Single vessel CAD with patent mid LAD stent  2. Normal LV function  EKG:  EKG is ordered today. The ekg ordered today demonstrates Sinus, rate 73 bpm. RBBB, LPFB,  PVCs  Recent Labs: No results found for requested labs within last 8760 hours.   Lipid Panel    Component Value Date/Time   CHOL 140 06/12/2015 0000   TRIG 250 (A) 06/12/2015 0000   HDL 38 06/12/2015 0000   CHOLHDL 4 06/21/2014 0841   VLDL 49.8 (H) 06/21/2014 0841   LDLCALC 51 06/12/2015 0000   LDLDIRECT 73.0 06/21/2014 0841     Wt Readings from Last 3 Encounters:  04/19/20 250 lb 6.4 oz (113.6 kg)  11/29/19 247 lb (112 kg)  08/16/18 241 lb (109.3 kg)     Other studies Reviewed: Additional studies/ records that were reviewed today include: . Review of the above records demonstrates:    Assessment and Plan:   1. CAD without angina: He has no chest pain. He has CAD with prior LAD stent placement. Last cath 06/14/13 with stable disease.  Echo with normal LV systolic function 08/67/61. Will continue ASA, Plavix, beta blocker and statin.   2. HTN: BP is slightly elevated today. Continue current therapy. He will follow several days per week at home  3. Hyperlipidemia: Lipids followed in Endocrine clinic. LDL 38 in August 2021. Continue statin.   4. Chronic diastolic CHF: Weight is stable. No volume overload on exam. He does not use Lasix.    Current medicines are reviewed at length with the patient today.  The patient does not have concerns regarding medicines.  The following changes have been made:  no change  Labs/ tests ordered today include:   Orders Placed This Encounter  Procedures  . EKG 12-Lead    Disposition:   F/U with me in 6 months  Signed, Lauree Chandler, MD 04/19/2020 9:36 AM    [facility] [place] [address], [place], [place]  [address] Phone: [phone]; Fax: [phone]

## 2020-04-19 ENCOUNTER — Other Ambulatory Visit: Payer: Self-pay

## 2020-04-19 ENCOUNTER — Encounter: Payer: Self-pay | Admitting: Cardiovascular Disease

## 2020-04-19 ENCOUNTER — Ambulatory Visit (INDEPENDENT_AMBULATORY_CARE_PROVIDER_SITE_OTHER): Payer: Medicare Other | Admitting: Cardiovascular Disease

## 2020-04-19 VITALS — BP 142/72 | HR 73 | Ht 70.0 in | Wt 250.4 lb

## 2020-04-19 DIAGNOSIS — I2510 Atherosclerotic heart disease of native coronary artery without angina pectoris: Secondary | ICD-10-CM | POA: Diagnosis not present

## 2020-04-19 DIAGNOSIS — E782 Mixed hyperlipidemia: Secondary | ICD-10-CM

## 2020-04-19 DIAGNOSIS — I10 Essential (primary) hypertension: Secondary | ICD-10-CM | POA: Diagnosis not present

## 2020-04-19 DIAGNOSIS — I5032 Chronic diastolic (congestive) heart failure: Secondary | ICD-10-CM | POA: Diagnosis not present

## 2020-04-19 MED ORDER — SIMVASTATIN 20 MG PO TABS: 20.0000 mg | ORAL_TABLET | Freq: Every day | ORAL | 3 refills | Status: DC

## 2020-04-19 MED ORDER — LISINOPRIL 40 MG PO TABS: 40.0000 mg | ORAL_TABLET | Freq: Every day | ORAL | 3 refills | Status: DC

## 2020-04-19 MED ORDER — AMLODIPINE BESYLATE 10 MG PO TABS: ORAL_TABLET | ORAL | 3 refills | Status: DC

## 2020-04-19 MED ORDER — CLOPIDOGREL BISULFATE 75 MG PO TABS: ORAL_TABLET | ORAL | 3 refills | Status: DC

## 2020-04-19 MED ORDER — METOPROLOL SUCCINATE ER 50 MG PO TB24: ORAL_TABLET | ORAL | 3 refills | Status: DC

## 2020-04-19 NOTE — Patient Instructions (Signed)

## 2020-04-25 DIAGNOSIS — I2510 Atherosclerotic heart disease of native coronary artery without angina pectoris: Secondary | ICD-10-CM | POA: Diagnosis not present

## 2020-04-25 DIAGNOSIS — E1142 Type 2 diabetes mellitus with diabetic polyneuropathy: Secondary | ICD-10-CM | POA: Diagnosis not present

## 2020-04-25 DIAGNOSIS — E291 Testicular hypofunction: Secondary | ICD-10-CM | POA: Diagnosis not present

## 2020-04-25 DIAGNOSIS — Z794 Long term (current) use of insulin: Secondary | ICD-10-CM | POA: Diagnosis not present

## 2020-04-25 DIAGNOSIS — E1165 Type 2 diabetes mellitus with hyperglycemia: Secondary | ICD-10-CM | POA: Diagnosis not present

## 2020-07-23 DIAGNOSIS — Z794 Long term (current) use of insulin: Secondary | ICD-10-CM | POA: Diagnosis not present

## 2020-07-23 DIAGNOSIS — E1165 Type 2 diabetes mellitus with hyperglycemia: Secondary | ICD-10-CM | POA: Diagnosis not present

## 2020-07-23 DIAGNOSIS — E1142 Type 2 diabetes mellitus with diabetic polyneuropathy: Secondary | ICD-10-CM | POA: Diagnosis not present

## 2020-07-23 DIAGNOSIS — E291 Testicular hypofunction: Secondary | ICD-10-CM | POA: Diagnosis not present

## 2020-07-23 DIAGNOSIS — I2510 Atherosclerotic heart disease of native coronary artery without angina pectoris: Secondary | ICD-10-CM | POA: Diagnosis not present

## 2020-10-25 DIAGNOSIS — I2510 Atherosclerotic heart disease of native coronary artery without angina pectoris: Secondary | ICD-10-CM | POA: Diagnosis not present

## 2020-10-25 DIAGNOSIS — Z794 Long term (current) use of insulin: Secondary | ICD-10-CM | POA: Diagnosis not present

## 2020-10-25 DIAGNOSIS — E291 Testicular hypofunction: Secondary | ICD-10-CM | POA: Diagnosis not present

## 2020-10-25 DIAGNOSIS — E1142 Type 2 diabetes mellitus with diabetic polyneuropathy: Secondary | ICD-10-CM | POA: Diagnosis not present

## 2020-10-25 DIAGNOSIS — E1165 Type 2 diabetes mellitus with hyperglycemia: Secondary | ICD-10-CM | POA: Diagnosis not present

## 2020-10-30 ENCOUNTER — Telehealth: Payer: Self-pay | Admitting: Family Medicine

## 2020-10-30 NOTE — Telephone Encounter (Signed)
Left message for patient to call back and schedule Medicare Annual Wellness Visit (AWV) either virtually or in office.  Left both  my jabber number [phone] and office number    Last AWV  11/29/19 please schedule at anytime with [facility] 1 or 2   This should be a 45 minute visit.

## 2020-11-06 ENCOUNTER — Other Ambulatory Visit: Payer: Self-pay | Admitting: *Deleted

## 2020-11-06 MED ORDER — METOPROLOL SUCCINATE ER 50 MG PO TB24: ORAL_TABLET | ORAL | 0 refills | Status: DC

## 2020-11-06 MED ORDER — AMLODIPINE BESYLATE 10 MG PO TABS: ORAL_TABLET | ORAL | 0 refills | Status: DC

## 2020-11-06 MED ORDER — LISINOPRIL 40 MG PO TABS: 40.0000 mg | ORAL_TABLET | Freq: Every day | ORAL | 0 refills | Status: DC

## 2020-11-06 MED ORDER — CLOPIDOGREL BISULFATE 75 MG PO TABS: ORAL_TABLET | ORAL | 0 refills | Status: DC

## 2020-11-06 MED ORDER — SIMVASTATIN 20 MG PO TABS: 20.0000 mg | ORAL_TABLET | Freq: Every day | ORAL | 0 refills | Status: DC

## 2020-11-06 NOTE — Addendum Note (Signed)
Addended by: Gaetano Net on: 11/06/2020 02:50 PM   Modules accepted: Orders

## 2020-11-08 ENCOUNTER — Other Ambulatory Visit: Payer: Self-pay

## 2020-11-08 MED ORDER — METOPROLOL SUCCINATE ER 50 MG PO TB24: ORAL_TABLET | ORAL | 1 refills | Status: DC

## 2020-11-28 ENCOUNTER — Telehealth: Payer: Self-pay | Admitting: Family Medicine

## 2020-11-28 NOTE — Telephone Encounter (Signed)
Left message asking pt to call [phone]  Please r/s 11/29/20 AWV appointment Mickel Baas will be out of office

## 2020-11-29 ENCOUNTER — Ambulatory Visit: Payer: Medicare Other

## 2020-12-01 DIAGNOSIS — Z23 Encounter for immunization: Secondary | ICD-10-CM | POA: Diagnosis not present

## 2020-12-04 DIAGNOSIS — E119 Type 2 diabetes mellitus without complications: Secondary | ICD-10-CM | POA: Diagnosis not present

## 2020-12-04 DIAGNOSIS — E11319 Type 2 diabetes mellitus with unspecified diabetic retinopathy without macular edema: Secondary | ICD-10-CM | POA: Diagnosis not present

## 2020-12-04 DIAGNOSIS — H5213 Myopia, bilateral: Secondary | ICD-10-CM | POA: Diagnosis not present

## 2020-12-04 DIAGNOSIS — H52223 Regular astigmatism, bilateral: Secondary | ICD-10-CM | POA: Diagnosis not present

## 2020-12-04 DIAGNOSIS — H2513 Age-related nuclear cataract, bilateral: Secondary | ICD-10-CM | POA: Diagnosis not present

## 2020-12-04 DIAGNOSIS — I10 Essential (primary) hypertension: Secondary | ICD-10-CM | POA: Diagnosis not present

## 2020-12-04 DIAGNOSIS — H524 Presbyopia: Secondary | ICD-10-CM | POA: Diagnosis not present

## 2020-12-04 DIAGNOSIS — E113293 Type 2 diabetes mellitus with mild nonproliferative diabetic retinopathy without macular edema, bilateral: Secondary | ICD-10-CM | POA: Diagnosis not present

## 2020-12-04 DIAGNOSIS — H53001 Unspecified amblyopia, right eye: Secondary | ICD-10-CM | POA: Diagnosis not present

## 2020-12-04 DIAGNOSIS — H35033 Hypertensive retinopathy, bilateral: Secondary | ICD-10-CM | POA: Diagnosis not present

## 2020-12-17 ENCOUNTER — Ambulatory Visit (INDEPENDENT_AMBULATORY_CARE_PROVIDER_SITE_OTHER): Payer: Medicare Other

## 2020-12-17 DIAGNOSIS — Z0000 Encounter for general adult medical examination without abnormal findings: Secondary | ICD-10-CM

## 2020-12-17 DIAGNOSIS — Z1211 Encounter for screening for malignant neoplasm of colon: Secondary | ICD-10-CM | POA: Diagnosis not present

## 2020-12-17 NOTE — Patient Instructions (Signed)
Joel Williams , Thank you for taking time to come for your Medicare Wellness Visit. I appreciate your ongoing commitment to your health goals. Please review the following plan we discussed and let me know if I can assist you in the future.   Screening recommendations/referrals: Colonoscopy: referral  12/17/2020 Recommended yearly ophthalmology/optometry visit for glaucoma screening and checkup Recommended yearly dental visit for hygiene and checkup  Vaccinations: Influenza vaccine: completed  Pneumococcal vaccine: completed series  Tdap vaccine: 07/05/2015 Shingles vaccine: completed series     Advanced directives: will provide copies   Conditions/risks identified: none   Next appointment: none   Preventive Care 66 Years and Older, Male Preventive care refers to lifestyle choices and visits with your health care provider that can promote health and wellness. What does preventive care include? A yearly physical exam. This is also called an annual well check. Dental exams once or twice a year. Routine eye exams. Ask your health care provider how often you should have your eyes checked. Personal lifestyle choices, including: Daily care of your teeth and gums. Regular physical activity. Eating a healthy diet. Avoiding tobacco and drug use. Limiting alcohol use. Practicing safe sex. Taking low doses of aspirin every day. Taking vitamin and mineral supplements as recommended by your health care provider. What happens during an annual well check? The services and screenings done by your health care provider during your annual well check will depend on your age, overall health, lifestyle risk factors, and family history of disease. Counseling  Your health care provider may ask you questions about your: Alcohol use. Tobacco use. Drug use. Emotional well-being. Home and relationship well-being. Sexual activity. Eating habits. History of falls. Memory and ability to understand  (cognition). Work and work Statistician. Screening  You may have the following tests or measurements: Height, weight, and BMI. Blood pressure. Lipid and cholesterol levels. These may be checked every 5 years, or more frequently if you are over 36 years old. Skin check. Lung cancer screening. You may have this screening every year starting at age 55 if you have a 30-pack-year history of smoking and currently smoke or have quit within the past 15 years. Fecal occult blood test (FOBT) of the stool. You may have this test every year starting at age 34. Flexible sigmoidoscopy or colonoscopy. You may have a sigmoidoscopy every 5 years or a colonoscopy every 10 years starting at age 50. Prostate cancer screening. Recommendations will vary depending on your family history and other risks. Hepatitis C blood test. Hepatitis B blood test. Sexually transmitted disease (STD) testing. Diabetes screening. This is done by checking your blood sugar (glucose) after you have not eaten for a while (fasting). You may have this done every 1-3 years. Abdominal aortic aneurysm (AAA) screening. You may need this if you are a current or former smoker. Osteoporosis. You may be screened starting at age 68 if you are at high risk. Talk with your health care provider about your test results, treatment options, and if necessary, the need for more tests. Vaccines  Your health care provider may recommend certain vaccines, such as: Influenza vaccine. This is recommended every year. Tetanus, diphtheria, and acellular pertussis (Tdap, Td) vaccine. You may need a Td booster every 10 years. Zoster vaccine. You may need this after age 94. Pneumococcal 13-valent conjugate (PCV13) vaccine. One dose is recommended after age 40. Pneumococcal polysaccharide (PPSV23) vaccine. One dose is recommended after age 62. Talk to your health care provider about which screenings and vaccines you  need and how often you need them. This  information is not intended to replace advice given to you by your health care provider. Make sure you discuss any questions you have with your health care provider. Document Released: 03/29/2015 Document Revised: 11/20/2015 Document Reviewed: 01/01/2015 Elsevier Interactive Patient Education  2017 [place] Prevention in the Home Falls can cause injuries. They can happen to people of all ages. There are many things you can do to make your home safe and to help prevent falls. What can I do on the outside of my home? Regularly fix the edges of walkways and driveways and fix any cracks. Remove anything that might make you trip as you walk through a door, such as a raised step or threshold. Trim any bushes or trees on the path to your home. Use bright outdoor lighting. Clear any walking paths of anything that might make someone trip, such as rocks or tools. Regularly check to see if handrails are loose or broken. Make sure that both sides of any steps have handrails. Any raised decks and porches should have guardrails on the edges. Have any leaves, snow, or ice cleared regularly. Use sand or salt on walking paths during winter. Clean up any spills in your garage right away. This includes oil or grease spills. What can I do in the bathroom? Use night lights. Install grab bars by the toilet and in the tub and shower. Do not use towel bars as grab bars. Use non-skid mats or decals in the tub or shower. If you need to sit down in the shower, use a plastic, non-slip stool. Keep the floor dry. Clean up any water that spills on the floor as soon as it happens. Remove soap buildup in the tub or shower regularly. Attach bath mats securely with double-sided non-slip rug tape. Do not have throw rugs and other things on the floor that can make you trip. What can I do in the bedroom? Use night lights. Make sure that you have a light by your bed that is easy to reach. Do not use any sheets or  blankets that are too big for your bed. They should not hang down onto the floor. Have a firm chair that has side arms. You can use this for support while you get dressed. Do not have throw rugs and other things on the floor that can make you trip. What can I do in the kitchen? Clean up any spills right away. Avoid walking on wet floors. Keep items that you use a lot in easy-to-reach places. If you need to reach something above you, use a strong step stool that has a grab bar. Keep electrical cords out of the way. Do not use floor polish or wax that makes floors slippery. If you must use wax, use non-skid floor wax. Do not have throw rugs and other things on the floor that can make you trip. What can I do with my stairs? Do not leave any items on the stairs. Make sure that there are handrails on both sides of the stairs and use them. Fix handrails that are broken or loose. Make sure that handrails are as long as the stairways. Check any carpeting to make sure that it is firmly attached to the stairs. Fix any carpet that is loose or worn. Avoid having throw rugs at the top or bottom of the stairs. If you do have throw rugs, attach them to the floor with carpet tape. Make sure that  you have a light switch at the top of the stairs and the bottom of the stairs. If you do not have them, ask someone to add them for you. What else can I do to help prevent falls? Wear shoes that: Do not have high heels. Have rubber bottoms. Are comfortable and fit you well. Are closed at the toe. Do not wear sandals. If you use a stepladder: Make sure that it is fully opened. Do not climb a closed stepladder. Make sure that both sides of the stepladder are locked into place. Ask someone to hold it for you, if possible. Clearly mark and make sure that you can see: Any grab bars or handrails. First and last steps. Where the edge of each step is. Use tools that help you move around (mobility aids) if they are  needed. These include: Canes. Walkers. Scooters. Crutches. Turn on the lights when you go into a dark area. Replace any light bulbs as soon as they burn out. Set up your furniture so you have a clear path. Avoid moving your furniture around. If any of your floors are uneven, fix them. If there are any pets around you, be aware of where they are. Review your medicines with your doctor. Some medicines can make you feel dizzy. This can increase your chance of falling. Ask your doctor what other things that you can do to help prevent falls. This information is not intended to replace advice given to you by your health care provider. Make sure you discuss any questions you have with your health care provider. Document Released: 12/27/2008 Document Revised: 08/08/2015 Document Reviewed: 04/06/2014 Elsevier Interactive Patient Education  2017 [employer].

## 2020-12-17 NOTE — Progress Notes (Signed)
Subjective:   Joel Williams is a 70 y.o. male who presents for an Subsequent Medicare Annual Wellness Visit.   Patient unable to find link to log into my chart. I connected with Errin Chewning today by telephone and verified that I am speaking with the correct person using two identifiers. Location patient: home Location provider: work Persons participating in the virtual visit: patient, provider.   I discussed the limitations, risks, security and privacy concerns of performing an evaluation and management service by telephone and the availability of in person appointments. I also discussed with the patient that there may be a patient responsible charge related to this service. The patient expressed understanding and verbally consented to this telephonic visit.    Interactive audio and video telecommunications were attempted between this provider and patient, however failed, due to patient having technical difficulties OR patient did not have access to video capability.  We continued and completed visit with audio only.    Review of Systems    N/a       Objective:    There were no vitals filed for this visit. There is no height or weight on file to calculate BMI.  Advanced Directives 07/06/2017 04/09/2014 06/14/2013 07/29/2011  Does Patient Have a Medical Advance Directive? Yes No Patient has advance directive, copy not in chart Patient has advance directive, copy not in chart  Type of Advance Directive - - Living will;Healthcare Power of [place];Living will  Copy of [place] in Chart? - - Copy requested from family Copy requested from family  Would patient like information on creating a medical advance directive? - No - patient declined information - -    Current Medications (verified) Outpatient Encounter Medications as of 12/17/2020  Medication Sig   amLODipine (NORVASC) 10 MG tablet TAKE 1 TABLET BY MOUTH DAILY.   Ascorbic  Acid (VITAMIN C) 1000 MG tablet Take 1,000 mg by mouth 2 (two) times daily.    aspirin 81 MG tablet Take 1 tablet (81 mg total) by mouth daily.   Biotin 1000 MCG tablet Take 1,000 mcg by mouth every evening.    Cholecalciferol 1000 UNITS TBDP Take 1,000 Units by mouth daily.   clopidogrel (PLAVIX) 75 MG tablet TAKE 1 TABLET BY MOUTH DAILY.   glucagon (GLUCAGON EMERGENCY) 1 MG injection Inject 1 mg into the vein once as needed (Low blood sugar).    Insulin Human (INSULIN PUMP) 100 unit/ml SOLN Inject into the skin continuous. Humalog; basal rate: 85-100 units per day.   lisinopril (ZESTRIL) 40 MG tablet Take 1 tablet (40 mg total) by mouth daily.   Magnesium 500 MG TABS Take 1 tablet by mouth daily.   metFORMIN (GLUCOPHAGE) 500 MG tablet Take 1,000 mg by mouth 2 (two) times daily with a meal.   metoprolol succinate (TOPROL-XL) 50 MG 24 hr tablet Take 1 tablet by mouth daily with or immediately following a meal.   Multiple Vitamins-Minerals (ICAPS MV PO) Take 1 capsule by mouth every evening.   nitroGLYCERIN (NITROSTAT) 0.4 MG SL tablet Place 0.4 mg under the tongue every 5 (five) minutes as needed for chest pain.    NOVOLIN R RELION 100 UNIT/ML injection USE AS DIRECTED VIA INSULIN PUMP UP TO 100 UNITS DAILY   Omega-3 Fatty Acids (FISH OIL) 1200 MG CAPS Take 1,200 mg by mouth daily.   simvastatin (ZOCOR) 20 MG tablet Take 1 tablet (20 mg total) by mouth daily.   tadalafil (CIALIS) 20 MG  tablet Take 20 mg by mouth every other day as needed for erectile dysfunction.   vitamin E 400 UNIT capsule Take 400 Units by mouth every evening.   Zinc 22.5 MG TABS Take 1 tablet by mouth in the morning and at bedtime.   No facility-administered encounter medications on file as of 12/17/2020.    Allergies (verified) Patient has no known allergies.   History: Past Medical History:  Diagnosis Date   ASTHMA 06/08/2008   CAD (coronary artery disease)    a. s/p Promus DES (3.0 x 16 mm) to mid LAD 05/16/10;  b.  Myoview 05/15/10: anteroseptal ischemia with EF 52%;   c. Cath 05/16/10: single vessel CAD with mLAD 90% tx with PCI and EF 50%;  d. 07/2011 Cath:  Patent stent, nonobs dzs, NL EF.   Chronic kidney disease    Colonic polyp    DIABETES MELLITUS, TYPE II 06/08/2008   GERD (gastroesophageal reflux disease)    Hemorrhoids    HYPERLIPIDEMIA 06/08/2008   HYPERTENSION 06/08/2008   Impotence of organic origin 08/24/2008   Myocardial infarction ([place]) 04/2010   Pneumonia    hx of   Past Surgical History:  Procedure Laterality Date   CORONARY ANGIOPLASTY WITH STENT PLACEMENT     LEFT HEART CATHETERIZATION WITH CORONARY ANGIOGRAM N/A 07/29/2011   Procedure: LEFT HEART CATHETERIZATION WITH CORONARY ANGIOGRAM;  Surgeon: Burnell Blanks, MD;  Location[facility] C[facility] LAB;  Service: Cardiovascular;  Laterality: N/A;   LEFT HEART CATHETERIZATION WITH CORONARY ANGIOGRAM N/A 06/14/2013   Procedure: LEFT HEART CATHETERIZATION WITH CORONARY ANGIOGRAM;  Surgeon: Burnell Blanks, MD;  Location: [facility] CATH LAB;  Service: Cardiovascular;  Laterality: N/A;   Family History  Problem Relation Age of Onset   Diabetes Father    Heart disease Father    Angina Father    Dementia Mother    Social History   Socioeconomic History   Marital status: Married    Spouse name: Not on file   Number of children: 2   Years of education: Not on file   Highest education level: Not on file  Occupational History   Occupation: Sales  Tobacco Use   Smoking status: Former    Packs/day: 2.00    Years: 53.00    Pack years: 106.00    Types: Cigarettes    Quit date: 05/23/2002    Years since quitting: 18.5   Smokeless tobacco: Never  Vaping Use   Vaping Use: Never used  Substance and Sexual Activity   Alcohol use: Yes    Alcohol/week: 14.0 standard drinks    Types: 14 Shots of liquor per week    Comment: one drink a day   Drug use: No   Sexual activity: Not Currently  Other Topics Concern   Not on file  Social History  Narrative   Not on file   Social Determinants of Health   Financial Resource Strain: Not on file  Food Insecurity: Not on file  Transportation Needs: Not on file  Physical Activity: Not on file  Stress: Not on file  Social Connections: Not on file    Tobacco Counseling Counseling given: Not Answered   Clinical Intake:                 Diabetic?yes  Nutrition Risk Assessment:  Has the patient had any N/V/D within the last 2 months?  No  Does the patient have any non-healing wounds?  No  Has the patient had any unintentional weight loss or weight  gain?  No   Diabetes:  Is the patient diabetic?  Yes  If diabetic, was a CBG obtained today?  No  Did the patient bring in their glucometer from home?  No  How often do you monitor your CBG's? 3 x day   Financial Strains and Diabetes Management:  Are you having any financial strains with the device, your supplies or your medication? No .  Does the patient want to be seen by Chronic Care Management for management of their diabetes?  No  Would the patient like to be referred to a Nutritionist or for Diabetic Management?  No   Diabetic Exams:  Diabetic Eye Exam: Completed 11/2020 Diabetic Foot Exam: Overdue, Pt has been advised about the importance in completing this exam. Pt is scheduled for diabetic foot exam on next office visit .         Activities of Daily Living No flowsheet data found.  Patient Care Team: [name], MD as PCP - General [name] Annita Brod, MD as PCP - Cardiology (Cardiology) Delrae Rend, MD as Attending Physician (Endocrinology)  Indicate any recent Medical Services you may have received from other than [facility] providers in the past year (date may be approximate).     Assessment:   This is a routine wellness examination for Jashon.  Hearing/Vision screen No results found.  Dietary issues and exercise activities discussed:     Goals Addressed   None    Depression  Screen PHQ 2/9 Scores 11/29/2019 07/06/2017 07/03/2016 07/05/2015 06/29/2014  PHQ - 2 Score 0 0 0 0 0    Fall Risk Fall Risk  02/06/2019 07/06/2017 07/03/2016 07/05/2015 06/29/2014  Falls in the past year? 0 No No No No  Comment Emmi Telephone Survey: data to providers prior to load - - - -    FALL RISK PREVENTION PERTAINING TO THE HOME:  Any stairs in or around the home? No  If so, are there any without handrails? No  Home free of loose throw rugs in walkways, pet beds, electrical cords, etc? Yes  Adequate lighting in your home to reduce risk of falls? Yes   ASSISTIVE DEVICES UTILIZED TO PREVENT FALLS:  Life alert? No  Use of a cane, walker or w/c? No  Grab bars in the bathroom? No  Shower chair or bench in shower? No  Elevated toilet seat or a handicapped toilet? No    Cognitive Function: Normal cognitive status assessed by direct observation by this Nurse Health Advisor. No abnormalities found.   MMSE - Mini Mental State Exam 07/06/2017  Not completed: (No Data)        Immunizations Immunization History  Administered Date(s) Administered   Influenza Split 12/28/2011, 12/26/2012   Influenza, High Dose Seasonal PF 12/13/2016, 12/03/2017, 12/06/2018, 11/26/2019   Influenza-Unspecified 12/31/2015   Moderna Sars-Covid-2 Vaccination 04/14/2019, 05/12/2019   Pneumococcal Conjugate-13 06/29/2014   Pneumococcal Polysaccharide-23 03/16/2005, 07/05/2015   Td 03/16/2005   Tdap 07/05/2015   Zoster, Live 08/06/2010    TDAP status: Up to date  Flu Vaccine status: Up to date  Pneumococcal vaccine status: Up to date  Covid-19 vaccine status: Completed vaccines  Qualifies for Shingles Vaccine? Yes   Zostavax completed Yes   Shingrix Completed?: Yes  Screening Tests Health Maintenance  Topic Date Due   FOOT EXAM  Never done   Zoster Vaccines- Shingrix (1 of 2) Never done   HEMOGLOBIN A1C  03/28/2017   OPHTHALMOLOGY EXAM  05/13/2018   COLONOSCOPY (Pts 45-22yrs Insurance  coverage  will need to be confirmed)  04/10/2019   COVID-19 Vaccine (4 - Booster for Moderna series) 04/04/2020   INFLUENZA VACCINE  10/14/2020   TETANUS/TDAP  07/04/2025   Hepatitis C Screening  Completed   HPV VACCINES  Aged Out    Health Maintenance  Health Maintenance Due  Topic Date Due   FOOT EXAM  Never done   Zoster Vaccines- Shingrix (1 of 2) Never done   HEMOGLOBIN A1C  03/28/2017   OPHTHALMOLOGY EXAM  05/13/2018   COLONOSCOPY (Pts 45-25yrs Insurance coverage will need to be confirmed)  04/10/2019   COVID-19 Vaccine (4 - Booster for Moderna series) 04/04/2020   INFLUENZA VACCINE  10/14/2020    Colorectal cancer screening: Referral to GI placed 12/17/2020. Pt aware the office will call re: appt.  Lung Cancer Screening: (Low Dose CT Chest recommended if Age 45-80 years, 30 pack-year currently smoking OR have quit w/in 15years.) does not qualify.   Lung Cancer Screening Referral: n/a  Additional Screening:  Hepatitis C Screening: does not qualify; Completed 07/03/2016  Vision Screening: Recommended annual ophthalmology exams for early detection of glaucoma and other disorders of the eye. Is the patient up to date with their annual eye exam?  Yes  Who is the provider or what is the name of the office in which the patient attends annual eye exams? Dr.Miller  If pt is not established with a provider, would they like to be referred to a provider to establish care? No .   Dental Screening: Recommended annual dental exams for proper oral hygiene  Community Resource Referral / Chronic Care Management: CRR required this visit?  No   CCM required this visit?  No      Plan:     I have personally reviewed and noted the following in the patient's chart:   Medical and social history Use of alcohol, tobacco or illicit drugs  Current medications and supplements including opioid prescriptions. Patient is not currently taking opioid prescriptions. Functional ability and  status Nutritional status Physical activity Advanced directives List of other physicians Hospitalizations, surgeries, and ER visits in previous 12 months Vitals Screenings to include cognitive, depression, and falls Referrals and appointments  In addition, I have reviewed and discussed with patient certain preventive protocols, quality metrics, and best practice recommendations. A written personalized care plan for preventive services as well as general preventive health recommendations were provided to patient.     Randel Pigg, LPN   04/16/1153   Nurse Notes: none

## 2021-01-01 ENCOUNTER — Other Ambulatory Visit: Payer: Self-pay

## 2021-01-01 MED ORDER — CLOPIDOGREL BISULFATE 75 MG PO TABS: ORAL_TABLET | ORAL | 1 refills | Status: DC

## 2021-01-01 MED ORDER — AMLODIPINE BESYLATE 10 MG PO TABS: ORAL_TABLET | ORAL | 1 refills | Status: DC

## 2021-01-01 MED ORDER — LISINOPRIL 40 MG PO TABS: 40.0000 mg | ORAL_TABLET | Freq: Every day | ORAL | 1 refills | Status: DC

## 2021-01-01 MED ORDER — SIMVASTATIN 20 MG PO TABS: 20.0000 mg | ORAL_TABLET | Freq: Every day | ORAL | 1 refills | Status: DC

## 2021-01-27 DIAGNOSIS — I2510 Atherosclerotic heart disease of native coronary artery without angina pectoris: Secondary | ICD-10-CM | POA: Diagnosis not present

## 2021-01-27 DIAGNOSIS — E1142 Type 2 diabetes mellitus with diabetic polyneuropathy: Secondary | ICD-10-CM | POA: Diagnosis not present

## 2021-01-27 DIAGNOSIS — Z794 Long term (current) use of insulin: Secondary | ICD-10-CM | POA: Diagnosis not present

## 2021-01-27 DIAGNOSIS — E291 Testicular hypofunction: Secondary | ICD-10-CM | POA: Diagnosis not present

## 2021-01-27 DIAGNOSIS — E1165 Type 2 diabetes mellitus with hyperglycemia: Secondary | ICD-10-CM | POA: Diagnosis not present

## 2021-03-21 ENCOUNTER — Encounter: Payer: Self-pay | Admitting: Gastroenterology

## 2021-04-25 ENCOUNTER — Telehealth: Payer: Self-pay

## 2021-04-25 ENCOUNTER — Encounter: Payer: Self-pay | Admitting: Gastroenterology

## 2021-04-25 ENCOUNTER — Ambulatory Visit (INDEPENDENT_AMBULATORY_CARE_PROVIDER_SITE_OTHER): Payer: Medicare Other | Admitting: Gastroenterology

## 2021-04-25 VITALS — BP 132/72 | HR 74 | Ht 69.0 in | Wt 255.2 lb

## 2021-04-25 DIAGNOSIS — Z7902 Long term (current) use of antithrombotics/antiplatelets: Secondary | ICD-10-CM | POA: Diagnosis not present

## 2021-04-25 DIAGNOSIS — Z86010 Personal history of colonic polyps: Secondary | ICD-10-CM | POA: Diagnosis not present

## 2021-04-25 MED ORDER — NA SULFATE-K SULFATE-MG SULF 17.5-3.13-1.6 GM/177ML PO SOLN: 1.0000 | Freq: Once | ORAL | 0 refills | Status: AC

## 2021-04-25 NOTE — Patient Instructions (Signed)
You have been scheduled for a colonoscopy. Please follow written instructions given to you at your visit today.  Please pick up your prep supplies at the pharmacy within the next 1-3 days. If you use inhalers (even only as needed), please bring them with you on the day of your procedure.  You will be contacted by our office prior to your procedure for directions on holding your Plavix.  If you do not hear from our office 1 week prior to your scheduled procedure, please call [phone] to discuss.   Due to recent changes in healthcare laws, you may see the results of your imaging and laboratory studies on MyChart before your provider has had a chance to review them.  We understand that in some cases there may be results that are confusing or concerning to you. Not all laboratory results come back in the same time frame and the provider may be waiting for multiple results in order to interpret others.  Please give [place] 48 hours in order for your provider to thoroughly review all the results before contacting the office for clarification of your results.   The [facility] providers would like to encourage you to use [facility] to communicate with providers for non-urgent requests or questions.  Due to long hold times on the telephone, sending your provider a message by [facility] may be a faster and more efficient way to get a response.  Please allow 48 business hours for a response.  Please remember that this is for non-urgent requests.   Thank you for choosing me and [facility].  Pricilla Riffle. Dagoberto Ligas., MD., Marval Regal

## 2021-04-25 NOTE — Telephone Encounter (Signed)
TANNEN VANDEZANDE 06-Mar-1951 [id number]   Dear Dr. Buddy Duty    Dr. Fuller Plan has scheduled the above individual for a(n) Colonoscopy at 10:30am on 06/04/21.  Our records show that this patient is on insulin therapy via an insulin pump.  Our colonoscopy prep protocol requires that:   the patient must be on a clear liquid diet the entire day prior to the procedure date as well as the morning of the procedure  the patient must be NPO for 3 to 4 hours prior to the procedure   the patient must consume a PEG 3350 solution to prepare for the procedure.  Please advise [place] of any adjustments that need to be made to the patients insulin pump therapy prior to the above procedure date.    Please route or fax your response to [phone] .  If you have any questions, please call me at [phone].  Thank you for your help with this matter.  Sincerely,  @signature @    Physician Recommendation:  _____________________________________________________________________  ______________________________________________________________________  ______________________________________________________________________  ______________________________________________________________________

## 2021-04-25 NOTE — Telephone Encounter (Signed)
I s/w the pt and offered him a sooner appt for pre op clearance as he is going to need to have time to hold his Plavix for procedure. Pt is coming in to see Dr. [name] 04/28/21 @ 2:10. Pt is grateful for the help. I will update the MD for the upcoming appt. Will send FYI to requesting office the pt has appt 04/28/21 2:10 with Dr. [name]. Once MD has cleared the pt he will have his nurse fax over clearance notes with any recommendations.

## 2021-04-25 NOTE — Telephone Encounter (Signed)
° °  Name: MIQUAN TANDON  DOB: September 09, 1950  MRN: [id number]  Primary Cardiologist: Lauree Chandler, MD  Chart reviewed as part of pre-operative protocol coverage. Because of Keylor Rands Gillson's past medical history and time since last visit, he will require a follow-up visit in order to better assess preoperative cardiovascular risk.  Pre-op covering staff: - Please schedule appointment and call patient to inform them. If patient already had an upcoming appointment within acceptable timeframe, please add "pre-op clearance" to the appointment notes so provider is aware. - Please contact requesting surgeon's office via preferred method (i.e, phone, fax) to inform them of need for appointment prior to surgery.  If applicable, this message will also be routed to pharmacy pool and/or primary cardiologist for input on holding anticoagulant/antiplatelet agent as requested below so that this information is available to the clearing provider at time of patient's appointment.   Patient has not been seen in a year and will need to follow-up prior to clearance to hold Plavix.  Colonoscopy currently scheduled for 3/22, patient is currently scheduled to see Dr. [name] on 3/21, however Plavix will need to be held for 5 days.  Therefore we will need to move up appointment with Dr. [name] to at least 5 days prior to the colonoscopy date.  [place], [place]  04/25/2021, 10:47 AM

## 2021-04-25 NOTE — Progress Notes (Signed)
History of Present Illness: This is a 71 year old male referred by [name], MD for the evaluation of a personal history of adenomatous colon polyps in 2010.  He has no ongoing gastrointestinal complaints.  He is maintained on Plavix status post remote placement of a coronary stent. He states he bleeds easily with minor injuries. Denies weight loss, abdominal pain, constipation, diarrhea, change in stool caliber, melena, hematochezia, nausea, vomiting, dysphagia, reflux symptoms, chest pain.  Colonoscopy 03/2014 1. Sessile polyp in the transverse colon; polypectomy performed with a cold snare 2. Two sessile polyps in the rectum; polypectomies performed with cold forceps 3. Diverticulosis in the sigmoid colon 4. Grade I internal hemorrhoids Path: Hyperplastic   No Known Allergies Outpatient Medications Prior to Visit  Medication Sig Dispense Refill   amLODipine (NORVASC) 10 MG tablet TAKE 1 TABLET BY MOUTH DAILY. 90 tablet 1   aspirin 81 MG tablet Take 1 tablet (81 mg total) by mouth daily. 30 tablet 0   Biotin 1000 MCG tablet Take 1,000 mcg by mouth every evening.      calcium-vitamin D (OSCAL WITH D) 500-200 MG-UNIT tablet Take 1 tablet by mouth.     clopidogrel (PLAVIX) 75 MG tablet TAKE 1 TABLET BY MOUTH DAILY. 90 tablet 1   glucagon 1 MG injection Inject 1 mg into the vein once as needed (Low blood sugar).      insulin glargine (LANTUS SOLOSTAR) 100 UNIT/ML Solostar Pen Inject 70 Units into the skin daily.     Insulin Human (INSULIN PUMP) 100 unit/ml SOLN Inject into the skin continuous. Humalog; basal rate: 85-100 units per day.     lisinopril (ZESTRIL) 40 MG tablet Take 1 tablet (40 mg total) by mouth daily. 90 tablet 1   Magnesium 500 MG TABS Take 1 tablet by mouth daily.     metFORMIN (GLUCOPHAGE) 500 MG tablet Take 1,000 mg by mouth 2 (two) times daily with a meal.     metoprolol succinate (TOPROL-XL) 50 MG 24 hr tablet Take 1 tablet by mouth daily with or immediately  following a meal. 90 tablet 1   Multiple Vitamins-Minerals (ICAPS MV PO) Take 1 capsule by mouth every evening.     nitroGLYCERIN (NITROSTAT) 0.4 MG SL tablet Place 0.4 mg under the tongue every 5 (five) minutes as needed for chest pain.      NOVOLIN R RELION 100 UNIT/ML injection USE AS DIRECTED VIA INSULIN PUMP UP TO 100 UNITS DAILY     Omega-3 Fatty Acids (FISH OIL) 1200 MG CAPS Take 1,200 mg by mouth daily.     Potassium 99 MG TABS Take by mouth.     simvastatin (ZOCOR) 20 MG tablet Take 1 tablet (20 mg total) by mouth daily. 90 tablet 1   vitamin E 400 UNIT capsule Take 400 Units by mouth every evening.     Zinc 22.5 MG TABS Take 1 tablet by mouth in the morning and at bedtime.     Ascorbic Acid (VITAMIN C) 1000 MG tablet Take 1,000 mg by mouth 2 (two) times daily.      Cholecalciferol 1000 UNITS TBDP Take 1,000 Units by mouth daily.     tadalafil (CIALIS) 20 MG tablet Take 20 mg by mouth every other day as needed for erectile dysfunction.     No facility-administered medications prior to visit.   Past Medical History:  Diagnosis Date   ASTHMA 06/08/2008   CAD (coronary artery disease)    a. s/p Promus DES (3.0  x 16 mm) to mid LAD 05/16/10;  b. Myoview 05/15/10: anteroseptal ischemia with EF 52%;   c. Cath 05/16/10: single vessel CAD with mLAD 90% tx with PCI and EF 50%;  d. 07/2011 Cath:  Patent stent, nonobs dzs, NL EF.   Chronic kidney disease    Colonic polyp    DIABETES MELLITUS, TYPE II 06/08/2008   GERD (gastroesophageal reflux disease)    Hemorrhoids    HYPERLIPIDEMIA 06/08/2008   HYPERTENSION 06/08/2008   Impotence of organic origin 08/24/2008   Myocardial infarction ([place]) 04/2010   Pneumonia    hx of   Past Surgical History:  Procedure Laterality Date   CORONARY ANGIOPLASTY WITH STENT PLACEMENT     LEFT HEART CATHETERIZATION WITH CORONARY ANGIOGRAM N/A 07/29/2011   Procedure: LEFT HEART CATHETERIZATION WITH CORONARY ANGIOGRAM;  Surgeon: Burnell Blanks, MD;  Location: [facility]  CATH LAB;  Service: Cardiovascular;  Laterality: N/A;   LEFT HEART CATHETERIZATION WITH CORONARY ANGIOGRAM N/A 06/14/2013   Procedure: LEFT HEART CATHETERIZATION WITH CORONARY ANGIOGRAM;  Surgeon: Burnell Blanks, MD;  Location: [facility] CATH LAB;  Service: Cardiovascular;  Laterality: N/A;   Social History   Socioeconomic History   Marital status: Married    Spouse name: Not on file   Number of children: 2   Years of education: Not on file   Highest education level: Not on file  Occupational History   Occupation: Sales  Tobacco Use   Smoking status: Former    Packs/day: 2.00    Years: 53.00    Pack years: 106.00    Types: Cigarettes    Quit date: 05/23/2002    Years since quitting: 18.9   Smokeless tobacco: Never  Vaping Use   Vaping Use: Never used  Substance and Sexual Activity   Alcohol use: Yes    Alcohol/week: 14.0 standard drinks    Types: 14 Shots of liquor per week    Comment: one drink a day   Drug use: No   Sexual activity: Not Currently  Other Topics Concern   Not on file  Social History Narrative   Not on file   Social Determinants of Health   Financial Resource Strain: Low Risk    Difficulty of Paying Living Expenses: Not hard at all  Food Insecurity: No Food Insecurity   Worried About Charity fundraiser in the Last Year: Never true   [employer] in the Last Year: Never true  Transportation Needs: No Transportation Needs   Lack of Transportation (Medical): No   Lack of Transportation (Non-Medical): No  Physical Activity: Inactive   Days of Exercise per Week: 0 days   Minutes of Exercise per Session: 0 min  Stress: No Stress Concern Present   Feeling of Stress : Not at all  Social Connections: Socially Integrated   Frequency of Communication with Friends and Family: Three times a week   Frequency of Social Gatherings with Friends and Family: Three times a week   Attends Religious Services: More than 4 times per year   Active Member of Clubs or  Organizations: Yes   Attends Music therapist: More than 4 times per year   Marital Status: Married   Family History  Problem Relation Age of Onset   Diabetes Father    Heart disease Father    Angina Father    Dementia Mother        Review of Systems: Pertinent positive and negative review of systems were noted in the above  HPI section. All other review of systems were otherwise negative.   Physical Exam: General: Well developed, well nourished, no acute distress Head: Normocephalic and atraumatic Eyes: Sclerae anicteric, EOMI Ears: Normal auditory acuity Mouth: Not examined, mask on during Covid-19 pandemic Neck: Supple, no masses or thyromegaly Lungs: Clear throughout to auscultation Heart: Regular rate and rhythm; no murmurs, rubs or bruits Abdomen: Soft, non tender and non distended. No masses, hepatosplenomegaly or hernias noted. Normal Bowel sounds Rectal: Deferred to colonoscopy  Musculoskeletal: Symmetrical with no gross deformities  Skin: No lesions on visible extremities Pulses:  Normal pulses noted Extremities: No clubbing, cyanosis, edema or deformities noted Neurological: Alert oriented x 4, grossly nonfocal Cervical Nodes:  No significant cervical adenopathy Inguinal Nodes: No significant inguinal adenopathy Psychological:  Alert and cooperative. Normal mood and affect   Assessment and Recommendations:  Personal history of adenomatous colon polyps in 2010.  Last colonoscopy in 2016 was without adenomatous polyps.  Schedule colonoscopy. The risks (including bleeding, perforation, infection, missed lesions, medication reactions and possible hospitalization or surgery if complications occur), benefits, and alternatives to colonoscopy with possible biopsy and possible polypectomy were discussed with the patient and they consent to proceed.   Hold Plavix, Vit E and Omega-3 for 5 days before procedure - will instruct when and how to resume after procedure.  Low but real risk of cardiovascular event such as heart attack, stroke, embolism, thrombosis or ischemia/infarct of other organs off Plavix explained and need to seek urgent help if this occurs. The patient consents to proceed. Will communicate by phone or EMR with patient's prescribing provider to confirm that holding Plavix is reasonable in this case.     cc: [name], MD [address] [place],  Page [phone]

## 2021-04-25 NOTE — Telephone Encounter (Signed)
[facility] Pre-operative Risk Assessment     Request for surgical clearance:     Endoscopy Procedure  What type of surgery is being performed?     colonoscopy  When is this surgery scheduled?     06/04/21  What type of clearance is required ?   Pharmacy  Are there any medications that need to be held prior to surgery and how long? Plavix x 5 days  Practice name and name of physician performing surgery?      [facility]  What is your office phone and fax number?      Phone- [phone]  [phone]  Anesthesia type (None, local, MAC, general) ?       MAC

## 2021-04-28 ENCOUNTER — Ambulatory Visit (INDEPENDENT_AMBULATORY_CARE_PROVIDER_SITE_OTHER): Payer: Medicare Other | Admitting: Cardiovascular Disease

## 2021-04-28 ENCOUNTER — Other Ambulatory Visit: Payer: Self-pay

## 2021-04-28 ENCOUNTER — Encounter: Payer: Self-pay | Admitting: Cardiovascular Disease

## 2021-04-28 VITALS — BP 148/80 | HR 66 | Ht 69.0 in | Wt 253.0 lb

## 2021-04-28 DIAGNOSIS — I5032 Chronic diastolic (congestive) heart failure: Secondary | ICD-10-CM | POA: Diagnosis not present

## 2021-04-28 DIAGNOSIS — E782 Mixed hyperlipidemia: Secondary | ICD-10-CM | POA: Diagnosis not present

## 2021-04-28 DIAGNOSIS — I2510 Atherosclerotic heart disease of native coronary artery without angina pectoris: Secondary | ICD-10-CM

## 2021-04-28 DIAGNOSIS — I10 Essential (primary) hypertension: Secondary | ICD-10-CM

## 2021-04-28 MED ORDER — SIMVASTATIN 20 MG PO TABS: 20.0000 mg | ORAL_TABLET | Freq: Every day | ORAL | 3 refills | Status: DC

## 2021-04-28 MED ORDER — LISINOPRIL 40 MG PO TABS: 40.0000 mg | ORAL_TABLET | Freq: Every day | ORAL | 3 refills | Status: DC

## 2021-04-28 MED ORDER — METOPROLOL SUCCINATE ER 50 MG PO TB24: ORAL_TABLET | ORAL | 3 refills | Status: DC

## 2021-04-28 NOTE — Progress Notes (Signed)
Chief Complaint  Patient presents with   Follow-up    CAD    History of Present Illness: 71 yo male with a history of CAD, DM, HTN, hyperlipidemia who is here today for cardiac follow up. Cardiac catheterization in 2012 demonstrated single vessel CAD with a mid LAD stenosis of 90%. This was treated with a 3.0 x 16 mm Promus Element DES. 48 hour Holter monitor 2012 with sinus, rare PVCs and PACs. Admitted to [facility] May 2013 with chest pain. Cardiac cath May 2013 with stable CAD, patent LAD stent. Lexiscan stress myoview 05/30/13 with LVEF=50%, possible inferior wall ischemia. Cardiac cath 06/14/13 with patent LAD stent and no disease in the small RCA or Circumflex. Echo 01/27/16 with normal LV systolic function, mild MR. Grade 2 diastolic dysfunction. He has chronic dyspnea. No AAA by u/s October 2021.   He is here today for follow up. The patient denies any chest pain, dyspnea, palpitations, lower extremity edema, orthopnea, PND, dizziness, near syncope or syncope. He has an upcoming colonoscopy.   Primary care: [name], MD  Past Medical History:  Diagnosis Date   ASTHMA 06/08/2008   CAD (coronary artery disease)    a. s/p Promus DES (3.0 x 16 mm) to mid LAD 05/16/10;  b. Myoview 05/15/10: anteroseptal ischemia with EF 52%;   c. Cath 05/16/10: single vessel CAD with mLAD 90% tx with PCI and EF 50%;  d. 07/2011 Cath:  Patent stent, nonobs dzs, NL EF.   Chronic kidney disease    Colonic polyp    DIABETES MELLITUS, TYPE II 06/08/2008   GERD (gastroesophageal reflux disease)    Hemorrhoids    HYPERLIPIDEMIA 06/08/2008   HYPERTENSION 06/08/2008   Impotence of organic origin 08/24/2008   Myocardial infarction ([place]) 04/2010   Pneumonia    hx of    Past Surgical History:  Procedure Laterality Date   CORONARY ANGIOPLASTY WITH STENT PLACEMENT     LEFT HEART CATHETERIZATION WITH CORONARY ANGIOGRAM N/A 07/29/2011   Procedure: LEFT HEART CATHETERIZATION WITH CORONARY ANGIOGRAM;  Surgeon: Burnell Blanks, MD;  Location: [facility] CATH LAB;  Service: Cardiovascular;  Laterality: N/A;   LEFT HEART CATHETERIZATION WITH CORONARY ANGIOGRAM N/A 06/14/2013   Procedure: LEFT HEART CATHETERIZATION WITH CORONARY ANGIOGRAM;  Surgeon: Burnell Blanks, MD;  Location: [facility] CATH LAB;  Service: Cardiovascular;  Laterality: N/A;    Current Outpatient Medications  Medication Sig Dispense Refill   amLODipine (NORVASC) 10 MG tablet TAKE 1 TABLET BY MOUTH DAILY. 90 tablet 1   aspirin 81 MG tablet Take 1 tablet (81 mg total) by mouth daily. 30 tablet 0   Biotin 1000 MCG tablet Take 1,000 mcg by mouth every evening.      calcium-vitamin D (OSCAL WITH D) 500-200 MG-UNIT tablet Take 1 tablet by mouth.     glucagon 1 MG injection Inject 1 mg into the vein once as needed (Low blood sugar).      insulin glargine (LANTUS SOLOSTAR) 100 UNIT/ML Solostar Pen Inject 70 Units into the skin daily.     Insulin Human (INSULIN PUMP) 100 unit/ml SOLN Inject into the skin continuous. Humalog; basal rate: 85-100 units per day.     Magnesium 500 MG TABS Take 1 tablet by mouth daily.     metFORMIN (GLUCOPHAGE) 500 MG tablet Take 1,000 mg by mouth 2 (two) times daily with a meal.     Multiple Vitamins-Minerals (ICAPS MV PO) Take 1 capsule by mouth every evening.     nitroGLYCERIN (NITROSTAT)  0.4 MG SL tablet Place 0.4 mg under the tongue every 5 (five) minutes as needed for chest pain.      NOVOLIN R RELION 100 UNIT/ML injection USE AS DIRECTED VIA INSULIN PUMP UP TO 100 UNITS DAILY     Omega-3 Fatty Acids (FISH OIL) 1200 MG CAPS Take 1,200 mg by mouth daily.     Potassium 99 MG TABS Take by mouth.     vitamin E 400 UNIT capsule Take 400 Units by mouth every evening.     Zinc 22.5 MG TABS Take 1 tablet by mouth in the morning and at bedtime.     lisinopril (ZESTRIL) 40 MG tablet Take 1 tablet (40 mg total) by mouth daily. 90 tablet 3   metoprolol succinate (TOPROL-XL) 50 MG 24 hr tablet Take 1 tablet by mouth daily with or  immediately following a meal. 90 tablet 3   simvastatin (ZOCOR) 20 MG tablet Take 1 tablet (20 mg total) by mouth daily. 90 tablet 3   No current facility-administered medications for this visit.    No Known Allergies  Social History   Socioeconomic History   Marital status: Married    Spouse name: Not on file   Number of children: 2   Years of education: Not on file   Highest education level: Not on file  Occupational History   Occupation: Sales  Tobacco Use   Smoking status: Former    Packs/day: 2.00    Years: 53.00    Pack years: 106.00    Types: Cigarettes    Quit date: 05/23/2002    Years since quitting: 18.9   Smokeless tobacco: Never  Vaping Use   Vaping Use: Never used  Substance and Sexual Activity   Alcohol use: Yes    Alcohol/week: 14.0 standard drinks    Types: 14 Shots of liquor per week    Comment: one drink a day   Drug use: No   Sexual activity: Not Currently  Other Topics Concern   Not on file  Social History Narrative   Not on file   Social Determinants of Health   Financial Resource Strain: Low Risk    Difficulty of Paying Living Expenses: Not hard at all  Food Insecurity: No Food Insecurity   Worried About Charity fundraiser in the Last Year: Never true   [employer] in the Last Year: Never true  Transportation Needs: No Transportation Needs   Lack of Transportation (Medical): No   Lack of Transportation (Non-Medical): No  Physical Activity: Inactive   Days of Exercise per Week: 0 days   Minutes of Exercise per Session: 0 min  Stress: No Stress Concern Present   Feeling of Stress : Not at all  Social Connections: Socially Integrated   Frequency of Communication with Friends and Family: Three times a week   Frequency of Social Gatherings with Friends and Family: Three times a week   Attends Religious Services: More than 4 times per year   Active Member of Clubs or Organizations: Yes   Attends Music therapist: More than 4  times per year   Marital Status: Married  Human resources officer Violence: Not At Risk   Fear of Current or Ex-Partner: No   Emotionally Abused: No   Physically Abused: No   Sexually Abused: No    Family History  Problem Relation Age of Onset   Diabetes Father    Heart disease Father    Angina Father    Dementia  Mother     Review of Systems:  As stated in the HPI and otherwise negative.   BP (!) 148/80    Pulse 66    Ht 5\' 9"  (1.753 m)    Wt 253 lb (114.8 kg)    SpO2 98%    BMI 37.36 kg/m   Physical Examination: General: Well developed, well nourished, NAD  HEENT: OP clear, mucus membranes moist  SKIN: warm, dry. No rashes. Neuro: No focal deficits  Musculoskeletal: Muscle strength 5/5 all ext  Psychiatric: Mood and affect normal  Neck: No JVD, no carotid bruits, no thyromegaly, no lymphadenopathy.  Lungs:Clear bilaterally, no wheezes, rhonci, crackles Cardiovascular: Regular rate and rhythm. No murmurs, gallops or rubs. Abdomen:Soft. Bowel sounds present. Non-tender.  Extremities: No lower extremity edema. Pulses are 2 + in the bilateral DP/PT.  Echo 01/27/16: - Left ventricle: The cavity size was normal. There was mild focal   basal hypertrophy of the septum. Systolic function was normal.   The estimated ejection fraction was in the range of 60% to 65%.   Wall motion was normal; there were no regional wall motion   abnormalities. Features are consistent with a pseudonormal left   ventricular filling pattern, with concomitant abnormal relaxation   and increased filling pressure (grade 2 diastolic dysfunction). - Aortic valve: Trileaflet; mildly thickened, mildly calcified   leaflets. - Mitral valve: Calcified annulus. Mildly thickened leaflets .   There was mild regurgitation. - Left atrium: The atrium was mildly to moderately dilated.   Volume/bsa, ES, (1-plane Simpson&'s, A2C): 45.8 ml/m^2.  Cardiac cath 06/14/13: Left main: No obstructive disease.  Left Anterior  Descending Artery: Moderate to large caliber vessel that courses to the apex. The proximal stent is patent with no restenosis. The mid and distal vessel has no obstructive disease. There are two small caliber diagonal branches with no obstructive disease.  Circumflex Artery: Large caliber vessel with early intermediate branch followed by two moderate sized OM branches. No obstructive disease noted.  Right Coronary Artery: Small non-dominant vessel with no obstructive disease noted.  Left Ventricular Angiogram: LVEF 50%.  Impression:  1. Single vessel CAD with patent mid LAD stent  2. Normal LV function  EKG:  EKG is  ordered today. The ekg ordered today demonstrates Sinus RBBB  Recent Labs: No results found for requested labs within last 8760 hours.   Lipid Panel    Component Value Date/Time   CHOL 140 06/12/2015 0000   TRIG 250 (A) 06/12/2015 0000   HDL 38 06/12/2015 0000   CHOLHDL 4 06/21/2014 0841   VLDL 49.8 (H) 06/21/2014 0841   LDLCALC 51 06/12/2015 0000   LDLDIRECT 73.0 06/21/2014 0841     Wt Readings from Last 3 Encounters:  04/28/21 253 lb (114.8 kg)  04/25/21 255 lb 3.2 oz (115.8 kg)  04/19/20 250 lb 6.4 oz (113.6 kg)     Other studies Reviewed: Additional studies/ records that were reviewed today include: . Review of the above records demonstrates:    Assessment and Plan:   1. CAD without angina: No chest pain. He has CAD with prior LAD stent placement. Last cath 06/14/13 with stable disease.  Echo with normal LV systolic function 53/29/92. Will continue ASA, beta blocker and statin. Will stop Plavix. Echo now to assess LVEF  2. HTN: BP is controlled. No changes  3. Hyperlipidemia: Lipids followed in Endocrine clinic. LDL 45 August 2022. Continue statin   4. Chronic diastolic CHF: Weight is stable. No volume overload on exam.  Will check echo now to assess LV function.   5. Pre-operative cardiovascular risk assessment. Doing well. Proceed with colonoscopy. Will  stop Plavix.   Current medicines are reviewed at length with the patient today.  The patient does not have concerns regarding medicines.  The following changes have been made:  no change  Labs/ tests ordered today include:   Orders Placed This Encounter  Procedures   ECHOCARDIOGRAM COMPLETE    Disposition:   F/U with me in 12 months  Signed, Lauree Chandler, MD 04/28/2021 2:31 PM    [facility] [place], [place], [place]  [address] Phone: [phone]; Fax: [phone]

## 2021-04-28 NOTE — Patient Instructions (Addendum)
Medication Instructions:  DISCONTINUE PLAVIX *If you need a refill on your cardiac medications before your next appointment, please call your pharmacy*   Lab Work: None ordered    Testing/Procedures: Your physician has requested that you have an echocardiogram. Echocardiography is a painless test that uses sound waves to create images of your heart. It provides your doctor with information about the size and shape of your heart and how well your hearts chambers and valves are working. This procedure takes approximately one hour. There are no restrictions for this procedure.   Follow-Up: At [facility], you and your health needs are our priority.  As part of our continuing mission to provide you with exceptional heart care, we have created designated Provider Care Teams.  These Care Teams include your primary Cardiologist (physician) and Advanced Practice Providers (APPs -  Physician Assistants and Nurse Practitioners) who all work together to provide you with the care you need, when you need it.  We recommend signing up for the patient portal called "MyChart".  Sign up information is provided on this After Visit Summary.  MyChart is used to connect with patients for Virtual Visits (Telemedicine).  Patients are able to view lab/test results, encounter notes, upcoming appointments, etc.  Non-urgent messages can be sent to your provider as well.   To learn more about what you can do with MyChart, go to [email or web address].    Your next appointment:   12 month(s)  The format for your next appointment:   In Person  Provider:   Lauree Chandler, MD     Other Instructions

## 2021-04-29 DIAGNOSIS — E1165 Type 2 diabetes mellitus with hyperglycemia: Secondary | ICD-10-CM | POA: Diagnosis not present

## 2021-04-29 DIAGNOSIS — Z794 Long term (current) use of insulin: Secondary | ICD-10-CM | POA: Diagnosis not present

## 2021-04-29 DIAGNOSIS — E1142 Type 2 diabetes mellitus with diabetic polyneuropathy: Secondary | ICD-10-CM | POA: Diagnosis not present

## 2021-04-29 DIAGNOSIS — E291 Testicular hypofunction: Secondary | ICD-10-CM | POA: Diagnosis not present

## 2021-04-29 DIAGNOSIS — I2510 Atherosclerotic heart disease of native coronary artery without angina pectoris: Secondary | ICD-10-CM | POA: Diagnosis not present

## 2021-04-29 NOTE — Telephone Encounter (Signed)
Per Cardiology (see office note 04/28/21) patient can stop Plavix for recommended 5 days.

## 2021-04-29 NOTE — Addendum Note (Signed)
Addended by: Mendel Ryder on: 04/29/2021 03:41 PM   Modules accepted: Orders

## 2021-05-05 ENCOUNTER — Telehealth: Payer: Self-pay | Admitting: Gastroenterology

## 2021-05-05 MED ORDER — GOLYTELY 236 G PO SOLR: 4000.0000 mL | Freq: Once | ORAL | 0 refills | Status: AC

## 2021-05-05 NOTE — Telephone Encounter (Signed)
Left message for patient to return my call.

## 2021-05-05 NOTE — Telephone Encounter (Signed)
Inbound call from pt returning your phone call. Thank you.

## 2021-05-05 NOTE — Telephone Encounter (Signed)
Informed patient most insurance companies do not cover the lower volume preps but I can switch him to a cheaper alternative. Patient agreed. Golytely has been sent to his pharmacy and new instructions were put on my chart. Patient understands to read them and call with any questions.

## 2021-05-05 NOTE — Telephone Encounter (Signed)
Patient states at his last Cardiology appt he was told to stop taking Plavix daily. Patient was told to continue 81 mg aspirin.

## 2021-05-05 NOTE — Telephone Encounter (Signed)
Inbound call from patient stating Suprpep is not covered insurance and is requesting alternate prep to be sent to pharmacy in chart please.

## 2021-05-10 ENCOUNTER — Encounter: Payer: Self-pay | Admitting: Cardiovascular Disease

## 2021-05-13 ENCOUNTER — Ambulatory Visit (HOSPITAL_COMMUNITY): Payer: Medicare Other | Attending: Internal Medicine

## 2021-05-13 ENCOUNTER — Other Ambulatory Visit: Payer: Self-pay

## 2021-05-13 DIAGNOSIS — E782 Mixed hyperlipidemia: Secondary | ICD-10-CM

## 2021-05-13 DIAGNOSIS — I2510 Atherosclerotic heart disease of native coronary artery without angina pectoris: Secondary | ICD-10-CM | POA: Diagnosis not present

## 2021-05-13 DIAGNOSIS — I10 Essential (primary) hypertension: Secondary | ICD-10-CM | POA: Insufficient documentation

## 2021-05-13 DIAGNOSIS — I5032 Chronic diastolic (congestive) heart failure: Secondary | ICD-10-CM | POA: Diagnosis not present

## 2021-05-13 LAB — ECHOCARDIOGRAM COMPLETE
Area-P 1/2: 3.72 cm2
S' Lateral: 3.2 cm

## 2021-05-19 NOTE — Telephone Encounter (Signed)
Received fax from Dr. Buddy Duty stating when patient is fasting or unable to eat: ?#1 check your blood sugar at least every 4 hours ?#2 check your blood sugar right before and right after your procedure.  ?#3 check your blood sugar if you feel as if it might be too low or high. ? ?Also, patient may stop insulin pump and take no more than half of your usual dose of Lantus (40 units instead of 80 units) when you are fasting or when you are unable to eat.  ? ?Informed patient of Dr. Cindra Eves recommendations and patient verbalized understanding. ?

## 2021-05-19 NOTE — Telephone Encounter (Signed)
Patient states he received Golytely in the mail and wanted to let us know. ?

## 2021-05-21 ENCOUNTER — Other Ambulatory Visit: Payer: Self-pay | Admitting: Cardiovascular Disease

## 2021-06-03 ENCOUNTER — Telehealth: Payer: Self-pay | Admitting: Gastroenterology

## 2021-06-03 ENCOUNTER — Ambulatory Visit: Payer: Medicare Other | Admitting: Cardiovascular Disease

## 2021-06-03 NOTE — Telephone Encounter (Signed)
Patient states he picked up the Golytely prescription from the pharmacy but he has instructions for the Suprep. Informed patient that when him an I spoke on 05/05/21 that I sent him new instructions in a MyChart message. Patient verbalized understanding and states he will go look on MyChart. Informed patient to call back if he cannot view them. ?

## 2021-06-03 NOTE — Telephone Encounter (Signed)
error 

## 2021-06-03 NOTE — Telephone Encounter (Signed)
Patient called requesting to speak to you regarding prep medication. Please advise ?

## 2021-06-04 ENCOUNTER — Encounter: Payer: Self-pay | Admitting: Gastroenterology

## 2021-06-04 ENCOUNTER — Ambulatory Visit (AMBULATORY_SURGERY_CENTER): Payer: Medicare Other | Admitting: Gastroenterology

## 2021-06-04 VITALS — BP 132/79 | HR 74 | Temp 97.8°F | Resp 18 | Ht 69.0 in | Wt 255.0 lb

## 2021-06-04 DIAGNOSIS — Z86010 Personal history of colonic polyps: Secondary | ICD-10-CM | POA: Diagnosis not present

## 2021-06-04 DIAGNOSIS — D125 Benign neoplasm of sigmoid colon: Secondary | ICD-10-CM | POA: Diagnosis not present

## 2021-06-04 DIAGNOSIS — I10 Essential (primary) hypertension: Secondary | ICD-10-CM | POA: Diagnosis not present

## 2021-06-04 DIAGNOSIS — K635 Polyp of colon: Secondary | ICD-10-CM | POA: Diagnosis not present

## 2021-06-04 DIAGNOSIS — E119 Type 2 diabetes mellitus without complications: Secondary | ICD-10-CM | POA: Diagnosis not present

## 2021-06-04 DIAGNOSIS — I2510 Atherosclerotic heart disease of native coronary artery without angina pectoris: Secondary | ICD-10-CM | POA: Diagnosis not present

## 2021-06-04 MED ORDER — SODIUM CHLORIDE 0.9 % IV SOLN: 500.0000 mL | Freq: Once | INTRAVENOUS | Status: DC

## 2021-06-04 NOTE — Progress Notes (Signed)
D.T. vital signs. °

## 2021-06-04 NOTE — Op Note (Signed)
[facility] ?Patient Name: Joel Williams ?Procedure Date: 06/04/2021 10:28 AM ?MRN: [id number] ?Endoscopist: Ladene Artist , MD ?Age: 71 ?Referring MD:  ?Date of Birth: 13-Nov-1950 ?Gender: Male ?Account #: [id number] ?Procedure:                Colonoscopy ?Indications:              Surveillance: Personal history of adenomatous  ?                          polyps on last colonoscopy > 5 years ago ?Medicines:                Monitored Anesthesia Care ?Procedure:                Pre-Anesthesia Assessment: ?                          - Prior to the procedure, a History and Physical  ?                          was performed, and patient medications and  ?                          allergies were reviewed. The patient's tolerance of  ?                          previous anesthesia was also reviewed. The risks  ?                          and benefits of the procedure and the sedation  ?                          options and risks were discussed with the patient.  ?                          All questions were answered, and informed consent  ?                          was obtained. Prior Anticoagulants: The patient has  ?                          taken Plavix (clopidogrel), last dose was 5 days  ?                          prior to procedure. ASA Grade Assessment: III - A  ?                          patient with severe systemic disease. After  ?                          reviewing the risks and benefits, the patient was  ?                          deemed in satisfactory condition to undergo the  ?  procedure. ?                          After obtaining informed consent, the colonoscope  ?                          was passed under direct vision. Throughout the  ?                          procedure, the patient's blood pressure, pulse, and  ?                          oxygen saturations were monitored continuously. The  ?                          CF [id number] #[id number] was introduced through the anus  ?                           and advanced to the the cecum, identified by  ?                          appendiceal orifice and ileocecal valve. The  ?                          ileocecal valve, appendiceal orifice, and rectum  ?                          were photographed. The quality of the bowel  ?                          preparation was good. The colonoscopy was performed  ?                          without difficulty. The patient tolerated the  ?                          procedure well. ?Scope In: 10:40:56 AM ?Scope Out: 45:40:98 AM ?Scope Withdrawal Time: 0 hours 17 minutes 31 seconds  ?Total Procedure Duration: 0 hours 20 minutes 8 seconds  ?Findings:                 The perianal and digital rectal examinations were  ?                          normal. ?                          A single small localized angiodysplastic lesion  ?                          without bleeding was found in the cecum. ?                          Four sessile polyps were found in the sigmoid  ?  colon. The polyps were 5 to 6 mm in size. These  ?                          polyps were removed with a cold snare. Resection  ?                          and retrieval were complete. ?                          A single medium-sized localized angiodysplastic  ?                          lesion without bleeding was found in the sigmoid  ?                          colon. ?                          Multiple medium-mouthed diverticula were found in  ?                          the left colon. There was no evidence of  ?                          diverticular bleeding. ?                          Internal hemorrhoids were found during  ?                          retroflexion. The hemorrhoids were moderate and  ?                          Grade I (internal hemorrhoids that do not prolapse). ?                          The exam was otherwise without abnormality on  ?                          direct and retroflexion views. ?Complications:             No immediate complications. Estimated blood loss:  ?                          None. ?Estimated Blood Loss:     Estimated blood loss: none. ?Impression:               - A single non-bleeding cecal angiodysplastic  ?                          lesion. ?                          - Four 5 to 6 mm polyps in the sigmoid colon,  ?  removed with a cold snare. Resected and retrieved. ?                          - A single non-bleeding sigmoid colon  ?                          angiodysplastic lesion. ?                          - Mild diverticulosis in the left colon. ?                          - Internal hemorrhoids. ?                          - The examination was otherwise normal on direct  ?                          and retroflexion views. ?Recommendation:           - Consider repeat colonoscopy vs no repeat due to  ?                          age after studies are complete for surveillance  ?                          based on pathology results. ?                          - Resume Plavix (clopidogrel) tomorrow at prior  ?                          dose. Refer to managing physician for further  ?                          adjustment of therapy. ?                          - Patient has a contact number available for  ?                          emergencies. The signs and symptoms of potential  ?                          delayed complications were discussed with the  ?                          patient. Return to normal activities tomorrow.  ?                          Written discharge instructions were provided to the  ?                          patient. ?                          -  Resume previous diet. ?                          - Continue present medications. ?                          - Await pathology results. ?Ladene Artist, MD ?06/04/2021 11:16:15 AM ?This report has been signed electronically. ?

## 2021-06-04 NOTE — Progress Notes (Signed)
VSS, transported to PACU °

## 2021-06-04 NOTE — Progress Notes (Signed)
Pt's states no medical or surgical changes since previsit or office visit. 

## 2021-06-04 NOTE — Progress Notes (Signed)
Called to room to assist during endoscopic procedure.  Patient ID and intended procedure confirmed with present staff. Received instructions for my participation in the procedure from the performing physician.  

## 2021-06-04 NOTE — Progress Notes (Signed)
History & Physical  Primary Care Physician:  Kristian Covey, MD Primary Gastroenterologist: Claudette Head, MD  CHIEF COMPLAINT:  Personal history of colon polyps   HPI: Joel Williams is a 71 y.o. male with a personal history of adenomatous colon polyps for colonoscopy.    Past Medical History:  Diagnosis Date   ASTHMA 06/08/2008   CAD (coronary artery disease)    a. s/p Promus DES (3.0 x 16 mm) to mid LAD 05/16/10;  b. Myoview 05/15/10: anteroseptal ischemia with EF 52%;   c. Cath 05/16/10: single vessel CAD with mLAD 90% tx with PCI and EF 50%;  d. 07/2011 Cath:  Patent stent, nonobs dzs, NL EF.   Chronic kidney disease    Colonic polyp    DIABETES MELLITUS, TYPE II 06/08/2008   GERD (gastroesophageal reflux disease)    Hemorrhoids    HYPERLIPIDEMIA 06/08/2008   HYPERTENSION 06/08/2008   Impotence of organic origin 08/24/2008   Myocardial infarction (HCC) 04/2010   Pneumonia    hx of    Past Surgical History:  Procedure Laterality Date   CORONARY ANGIOPLASTY WITH STENT PLACEMENT     LEFT HEART CATHETERIZATION WITH CORONARY ANGIOGRAM N/A 07/29/2011   Procedure: LEFT HEART CATHETERIZATION WITH CORONARY ANGIOGRAM;  Surgeon: Kathleene Hazel, MD;  Location: [facility] C[facility] LAB;  Service: Cardiovascular;  Laterality: N/A;   LEFT HEART CATHETERIZATION WITH CORONARY ANGIOGRAM N/A 06/14/2013   Procedure: LEFT HEART CATHETERIZATION WITH CORONARY ANGIOGRAM;  Surgeon: Kathleene Hazel, MD;  Location: [facility] CATH LAB;  Service: Cardiovascular;  Laterality: N/A;    Prior to Admission medications   Medication Sig Start Date End Date Taking? Authorizing Provider  amLODipine (NORVASC) 10 MG tablet TAKE 1 TABLET BY MOUTH DAILY. 01/01/21  Yes Kathleene Hazel, MD  aspirin 81 MG tablet Take 1 tablet (81 mg total) by mouth daily. 09/03/10  Yes Kathleene Hazel, MD  B-D ULTRAFINE III SHORT PEN 31G X 8 MM MISC Inject 1 Syringe into the skin daily. 01/21/21  Yes [provider]   Biotin 1000 MCG tablet Take 1,000 mcg by mouth every evening.    Yes [provider]  calcium-vitamin D (OSCAL WITH D) 500-200 MG-UNIT tablet Take 1 tablet by mouth.   Yes [provider]  Insulin Disposable Pump (OMNIPOD DASH PODS, GEN 4,) MISC Inject into the skin. 02/04/21  Yes [provider]  insulin glargine (LANTUS SOLOSTAR) 100 UNIT/ML Solostar Pen Inject 70 Units into the skin daily.   Yes [provider]  Insulin Human (INSULIN PUMP) 100 unit/ml SOLN Inject into the skin continuous. Humalog; basal rate: 85-100 units per day.   Yes [provider]  lisinopril (ZESTRIL) 40 MG tablet Take 1 tablet (40 mg total) by mouth daily. 04/28/21  Yes Kathleene Hazel, MD  Magnesium 500 MG TABS Take 1 tablet by mouth daily. 03/17/19  Yes [provider]  metFORMIN (GLUCOPHAGE) 500 MG tablet Take 1,000 mg by mouth 2 (two) times daily with a meal.   Yes [provider]  metoprolol succinate (TOPROL-XL) 50 MG 24 hr tablet TAKE 1 TABLET DAILY WITH OR IMMEDIATELY FOLLOWING A MEAL 05/21/21  Yes Kathleene Hazel, MD  Multiple Vitamins-Minerals (ICAPS MV PO) Take 1 capsule by mouth every evening.   Yes [provider]  NOVOLIN R RELION 100 UNIT/ML injection USE AS DIRECTED VIA INSULIN PUMP UP TO 100 UNITS DAILY 02/21/18  Yes [provider]  Potassium 99 MG TABS Take by mouth.  Yes [provider]  Semaglutide,0.25 or 0.5MG /DOS, (OZEMPIC, 0.25 OR 0.5 MG/DOSE,) 2 MG/1.5ML SOPN Titrate 0.25 mg once weekly for 4 weeks then 0.5 mg once weekly for 2 weeks 04/29/21  Yes [provider]  simvastatin (ZOCOR) 20 MG tablet Take 1 tablet (20 mg total) by mouth daily. 04/28/21  Yes Kathleene Hazel, MD  vitamin E 400 UNIT capsule Take 400 Units by mouth every evening.   Yes [provider]  Zinc 22.5 MG TABS Take 1 tablet by mouth in the morning and at bedtime. 03/17/19  Yes [provider]   glucagon 1 MG injection Inject 1 mg into the vein once as needed (Low blood sugar).     [provider]  nitroGLYCERIN (NITROSTAT) 0.4 MG SL tablet Place 0.4 mg under the tongue every 5 (five) minutes as needed for chest pain.     [provider]  Omega-3 Fatty Acids (FISH OIL) 1200 MG CAPS Take 1,200 mg by mouth daily. Patient not taking: Reported on 06/04/2021    [provider]    Current Outpatient Medications  Medication Sig Dispense Refill   amLODipine (NORVASC) 10 MG tablet TAKE 1 TABLET BY MOUTH DAILY. 90 tablet 1   aspirin 81 MG tablet Take 1 tablet (81 mg total) by mouth daily. 30 tablet 0   B-D ULTRAFINE III SHORT PEN 31G X 8 MM MISC Inject 1 Syringe into the skin daily.     Biotin 1000 MCG tablet Take 1,000 mcg by mouth every evening.      calcium-vitamin D (OSCAL WITH D) 500-200 MG-UNIT tablet Take 1 tablet by mouth.     Insulin Disposable Pump (OMNIPOD DASH PODS, GEN 4,) MISC Inject into the skin.     insulin glargine (LANTUS SOLOSTAR) 100 UNIT/ML Solostar Pen Inject 70 Units into the skin daily.     Insulin Human (INSULIN PUMP) 100 unit/ml SOLN Inject into the skin continuous. Humalog; basal rate: 85-100 units per day.     lisinopril (ZESTRIL) 40 MG tablet Take 1 tablet (40 mg total) by mouth daily. 90 tablet 3   Magnesium 500 MG TABS Take 1 tablet by mouth daily.     metFORMIN (GLUCOPHAGE) 500 MG tablet Take 1,000 mg by mouth 2 (two) times daily with a meal.     metoprolol succinate (TOPROL-XL) 50 MG 24 hr tablet TAKE 1 TABLET DAILY WITH OR IMMEDIATELY FOLLOWING A MEAL 90 tablet 3   Multiple Vitamins-Minerals (ICAPS MV PO) Take 1 capsule by mouth every evening.     NOVOLIN R RELION 100 UNIT/ML injection USE AS DIRECTED VIA INSULIN PUMP UP TO 100 UNITS DAILY     Potassium 99 MG TABS Take by mouth.     Semaglutide,0.25 or 0.5MG /DOS, (OZEMPIC, 0.25 OR 0.5 MG/DOSE,) 2 MG/1.5ML SOPN Titrate 0.25 mg once weekly for 4 weeks then 0.5 mg once weekly for 2  weeks     simvastatin (ZOCOR) 20 MG tablet Take 1 tablet (20 mg total) by mouth daily. 90 tablet 3   vitamin E 400 UNIT capsule Take 400 Units by mouth every evening.     Zinc 22.5 MG TABS Take 1 tablet by mouth in the morning and at bedtime.     glucagon 1 MG injection Inject 1 mg into the vein once as needed (Low blood sugar).      nitroGLYCERIN (NITROSTAT) 0.4 MG SL tablet Place 0.4 mg under the tongue every 5 (five) minutes as needed for chest pain.  Omega-3 Fatty Acids (FISH OIL) 1200 MG CAPS Take 1,200 mg by mouth daily. (Patient not taking: Reported on 06/04/2021)     Current Facility-Administered Medications  Medication Dose Route Frequency Provider Last Rate Last Admin   0.9 %  sodium chloride infusion  500 mL Intravenous Once Meryl Dare, MD        Allergies as of 06/04/2021   (No Known Allergies)    Family History  Problem Relation Age of Onset   Diabetes Father    Heart disease Father    Angina Father    Dementia Mother     Social History   Socioeconomic History   Marital status: Married    Spouse name: Not on file   Number of children: 2   Years of education: Not on file   Highest education level: Not on file  Occupational History   Occupation: Sales  Tobacco Use   Smoking status: Former    Packs/day: 2.00    Years: 53.00    Pack years: 106.00    Types: Cigarettes    Quit date: 05/23/2002    Years since quitting: 19.0   Smokeless tobacco: Never  Vaping Use   Vaping Use: Never used  Substance and Sexual Activity   Alcohol use: Yes    Alcohol/week: 14.0 standard drinks    Types: 14 Shots of liquor per week    Comment: one drink a day   Drug use: No   Sexual activity: Not Currently  Other Topics Concern   Not on file  Social History Narrative   Not on file   Social Determinants of Health   Financial Resource Strain: Low Risk    Difficulty of Paying Living Expenses: Not hard at all  Food Insecurity: No Food Insecurity   Worried About  Programme researcher, broadcasting/film/video in the Last Year: Never true   Ran Out of Food in the Last Year: Never true  Transportation Needs: No Transportation Needs   Lack of Transportation (Medical): No   Lack of Transportation (Non-Medical): No  Physical Activity: Inactive   Days of Exercise per Week: 0 days   Minutes of Exercise per Session: 0 min  Stress: No Stress Concern Present   Feeling of Stress : Not at all  Social Connections: Socially Integrated   Frequency of Communication with Friends and Family: Three times a week   Frequency of Social Gatherings with Friends and Family: Three times a week   Attends Religious Services: More than 4 times per year   Active Member of Clubs or Organizations: Yes   Attends Engineer, structural: More than 4 times per year   Marital Status: Married  Catering manager Violence: Not At Risk   Fear of Current or Ex-Partner: No   Emotionally Abused: No   Physically Abused: No   Sexually Abused: No    Review of Systems:  All systems reviewed an negative except where noted in HPI.  Gen: Denies any fever, chills, sweats, anorexia, fatigue, weakness, malaise, weight loss, and sleep disorder CV: Denies chest pain, angina, palpitations, syncope, orthopnea, PND, peripheral edema, and claudication. Resp: Denies dyspnea at rest, dyspnea with exercise, cough, sputum, wheezing, coughing up blood, and pleurisy. GI: Denies vomiting blood, jaundice, and fecal incontinence.   Denies dysphagia or odynophagia. GU : Denies urinary burning, blood in urine, urinary frequency, urinary hesitancy, nocturnal urination, and urinary incontinence. MS: Denies joint pain, limitation of movement, and swelling, stiffness, low back pain, extremity pain. Denies muscle weakness, cramps,  atrophy.  Derm: Denies rash, itching, dry skin, hives, moles, warts, or unhealing ulcers.  Psych: Denies depression, anxiety, memory loss, suicidal ideation, hallucinations, paranoia, and confusion. Heme:  Denies bruising, bleeding, and enlarged lymph nodes. Neuro:  Denies any headaches, dizziness, paresthesias. Endo:  Denies any problems with DM, thyroid, adrenal function.   Physical Exam: General:  Alert, well-developed, in NAD Head:  Normocephalic and atraumatic. Eyes:  Sclera clear, no icterus.   Conjunctiva pink. Ears:  Normal auditory acuity. Mouth:  No deformity or lesions.  Neck:  Supple; no masses . Lungs:  Clear throughout to auscultation.   No wheezes, crackles, or rhonchi. No acute distress. Heart:  Regular rate and rhythm; no murmurs. Abdomen:  Soft, nondistended, nontender. No masses, hepatomegaly. No obvious masses.  Normal bowel .    Rectal:  Deferred   Msk:  Symmetrical without gross deformities.. Pulses:  Normal pulses noted. Extremities:  Without edema. Neurologic:  Alert and  oriented x4;  grossly normal neurologically. Skin:  Intact without significant lesions or rashes. Cervical Nodes:  No significant cervical adenopathy. Psych:  Alert and cooperative. Normal mood and affect.   Impression / Plan:   Personal history of adenomatous colon polyps for colonoscopy.   Venita Lick. Russella Dar  06/04/2021, 10:38 AM See Loretha Stapler, [facility], to contact our on call provider

## 2021-06-04 NOTE — Patient Instructions (Signed)
RESUME PLAVIX ( CLOPIDOGREL) TOMORROW 06/05/2021 AT PREVIOUS DOSE . Refer to managing physician for further adjustment of therapy. ? ?Handout on polyps, diverticulosis and hemorrhoids provided  ? ?Await pathology results.  ? ?YOU HAD AN ENDOSCOPIC PROCEDURE TODAY AT [facility]:   Refer to the procedure report that was given to you for any specific questions about what was found during the examination.  If the procedure report does not answer your questions, please call your gastroenterologist to clarify.  If you requested that your care partner not be given the details of your procedure findings, then the procedure report has been included in a sealed envelope for you to review at your convenience later. ? ?YOU SHOULD EXPECT: Some feelings of bloating in the abdomen. Passage of more gas than usual.  Walking can help get rid of the air that was put into your GI tract during the procedure and reduce the bloating. If you had a lower endoscopy (such as a colonoscopy or flexible sigmoidoscopy) you may notice spotting of blood in your stool or on the toilet paper. If you underwent a bowel prep for your procedure, you may not have a normal bowel movement for a few days. ? ?Please Note:  You might notice some irritation and congestion in your nose or some drainage.  This is from the oxygen used during your procedure.  There is no need for concern and it should clear up in a day or so. ? ?SYMPTOMS TO REPORT IMMEDIATELY: ? ?Following lower endoscopy (colonoscopy or flexible sigmoidoscopy): ? Excessive amounts of blood in the stool ? Significant tenderness or worsening of abdominal pains ? Swelling of the abdomen that is new, acute ? Fever of 100?F or higher ? ?For urgent or emergent issues, a gastroenterologist can be reached at any hour by calling [phone]. ?Do not use MyChart messaging for urgent concerns.  ? ? ?DIET:  We do recommend a small meal at first, but then you may proceed to your regular  diet.  Drink plenty of fluids but you should avoid alcoholic beverages for 24 hours. ? ?ACTIVITY:  You should plan to take it easy for the rest of today and you should NOT DRIVE or use heavy machinery until tomorrow (because of the sedation medicines used during the test).   ? ?FOLLOW UP: ?Our staff will call the number listed on your records 48-72 hours following your procedure to check on you and address any questions or concerns that you may have regarding the information given to you following your procedure. If we do not reach you, we will leave a message.  We will attempt to reach you two times.  During this call, we will ask if you have developed any symptoms of COVID 19. If you develop any symptoms (ie: fever, flu-like symptoms, shortness of breath, cough etc.) before then, please call [phone].  If you test positive for Covid 19 in the 2 weeks post procedure, please call and report this information to [place].   ? ?If any biopsies were taken you will be contacted by phone or by letter within the next 1-3 weeks.  Please call us at [phone] if you have not heard about the biopsies in 3 weeks.  ? ? ?SIGNATURES/CONFIDENTIALITY: ?You and/or your care partner have signed paperwork which will be entered into your electronic medical record.  These signatures attest to the fact that that the information above on your After Visit Summary has been reviewed and is understood.  Full  responsibility of the confidentiality of this discharge information lies with you and/or your care-partner. ? ? ?

## 2021-06-06 ENCOUNTER — Telehealth: Payer: Self-pay

## 2021-06-06 ENCOUNTER — Telehealth: Payer: Self-pay | Admitting: *Deleted

## 2021-06-06 NOTE — Telephone Encounter (Signed)
?  Follow up Call- ? ? ?  06/04/2021  ? 10:16 AM  ?Call back number  ?Post procedure Call Back phone  # [phone]  ?Permission to leave phone message Yes  ?  ? ?Patient questions: ? ?Do you have a fever, pain , or abdominal swelling? No. ?Pain Score  0 * ? ?Have you tolerated food without any problems? Yes.   ? ?Have you been able to return to your normal activities? Yes.   ? ?Do you have any questions about your discharge instructions: ?Diet   No. ?Medications  No. ?Follow up visit  No. ? ?Do you have questions or concerns about your Care? No. ? ?Actions: ?* If pain score is 4 or above: ?No action needed, pain <4. ? ? ?

## 2021-06-06 NOTE — Telephone Encounter (Signed)
First attempt follow up call to pt, lm for pt to call if having any problems or questions, otherwise we will call them back later this morning or early this afternoon.  °

## 2021-06-16 ENCOUNTER — Encounter: Payer: Self-pay | Admitting: Gastroenterology

## 2021-07-11 DIAGNOSIS — R059 Cough, unspecified: Secondary | ICD-10-CM | POA: Diagnosis not present

## 2021-07-11 DIAGNOSIS — R062 Wheezing: Secondary | ICD-10-CM | POA: Diagnosis not present

## 2021-07-11 DIAGNOSIS — J22 Unspecified acute lower respiratory infection: Secondary | ICD-10-CM | POA: Diagnosis not present

## 2021-07-28 DIAGNOSIS — E1142 Type 2 diabetes mellitus with diabetic polyneuropathy: Secondary | ICD-10-CM | POA: Diagnosis not present

## 2021-07-28 DIAGNOSIS — I2510 Atherosclerotic heart disease of native coronary artery without angina pectoris: Secondary | ICD-10-CM | POA: Diagnosis not present

## 2021-07-28 DIAGNOSIS — E291 Testicular hypofunction: Secondary | ICD-10-CM | POA: Diagnosis not present

## 2021-07-28 DIAGNOSIS — Z794 Long term (current) use of insulin: Secondary | ICD-10-CM | POA: Diagnosis not present

## 2021-07-28 DIAGNOSIS — E1165 Type 2 diabetes mellitus with hyperglycemia: Secondary | ICD-10-CM | POA: Diagnosis not present

## 2021-08-01 ENCOUNTER — Other Ambulatory Visit: Payer: Self-pay | Admitting: Cardiovascular Disease

## 2021-10-29 DIAGNOSIS — Z794 Long term (current) use of insulin: Secondary | ICD-10-CM | POA: Diagnosis not present

## 2021-10-29 DIAGNOSIS — E291 Testicular hypofunction: Secondary | ICD-10-CM | POA: Diagnosis not present

## 2021-10-29 DIAGNOSIS — E1165 Type 2 diabetes mellitus with hyperglycemia: Secondary | ICD-10-CM | POA: Diagnosis not present

## 2021-10-29 DIAGNOSIS — E1142 Type 2 diabetes mellitus with diabetic polyneuropathy: Secondary | ICD-10-CM | POA: Diagnosis not present

## 2021-10-29 DIAGNOSIS — I2510 Atherosclerotic heart disease of native coronary artery without angina pectoris: Secondary | ICD-10-CM | POA: Diagnosis not present

## 2021-12-03 DIAGNOSIS — H52223 Regular astigmatism, bilateral: Secondary | ICD-10-CM | POA: Diagnosis not present

## 2021-12-03 DIAGNOSIS — H04213 Epiphora due to excess lacrimation, bilateral lacrimal glands: Secondary | ICD-10-CM | POA: Diagnosis not present

## 2021-12-03 DIAGNOSIS — E113293 Type 2 diabetes mellitus with mild nonproliferative diabetic retinopathy without macular edema, bilateral: Secondary | ICD-10-CM | POA: Diagnosis not present

## 2021-12-03 DIAGNOSIS — H2513 Age-related nuclear cataract, bilateral: Secondary | ICD-10-CM | POA: Diagnosis not present

## 2021-12-03 DIAGNOSIS — H53001 Unspecified amblyopia, right eye: Secondary | ICD-10-CM | POA: Diagnosis not present

## 2021-12-03 DIAGNOSIS — H524 Presbyopia: Secondary | ICD-10-CM | POA: Diagnosis not present

## 2021-12-03 DIAGNOSIS — H5213 Myopia, bilateral: Secondary | ICD-10-CM | POA: Diagnosis not present

## 2021-12-03 LAB — HM DIABETES EYE EXAM

## 2021-12-05 ENCOUNTER — Encounter: Payer: Self-pay | Admitting: Family Medicine

## 2021-12-08 ENCOUNTER — Encounter: Payer: Self-pay | Admitting: Family Medicine

## 2021-12-22 ENCOUNTER — Ambulatory Visit (INDEPENDENT_AMBULATORY_CARE_PROVIDER_SITE_OTHER): Payer: Medicare Other

## 2021-12-22 VITALS — Ht 69.0 in | Wt 240.0 lb

## 2021-12-22 DIAGNOSIS — Z0000 Encounter for general adult medical examination without abnormal findings: Secondary | ICD-10-CM | POA: Diagnosis not present

## 2021-12-22 NOTE — Progress Notes (Signed)
Subjective:   Joel Williams is a 71 y.o. male who presents for Medicare Annual/Subsequent preventive examination.  Review of Systems    Virtual Visit via Telephone Note  I connected with  Joel Williams on 12/22/21 at  8:15 AM EDT by telephone and verified that I am speaking with the correct person using two identifiers.  Location: Patient: Home Provider: Office Persons participating in the virtual visit: patient/Nurse Health Advisor   I discussed the limitations, risks, security and privacy concerns of performing an evaluation and management service by telephone and the availability of in person appointments. The patient expressed understanding and agreed to proceed.  Interactive audio and video telecommunications were attempted between this nurse and patient, however failed, due to patient having technical difficulties OR patient did not have access to video capability.  We continued and completed visit with audio only.  Some vital signs may be absent or patient reported.   Criselda Peaches, LPN  Cardiac Risk Factors include: advanced age (>55mn, >>17women);diabetes mellitus;male gender;hypertension     Objective:    Today's Vitals   12/22/21 0828  Weight: 240 lb (108.9 kg)  Height: '5\' 9"'$  (1.753 m)   Body mass index is 35.44 kg/m.     12/22/2021    8:36 AM 12/17/2020    8:36 AM 07/06/2017    8:47 AM 04/09/2014    7:28 AM 06/14/2013    9:25 AM 07/29/2011   11:27 AM  Advanced Directives  Does Patient Have a Medical Advance Directive? Yes Yes Yes No Patient has advance directive, copy not in chart Patient has advance directive, copy not in chart  Type of Advance Directive [place]   Living will;Healthcare Power of [place]  Copy of [place]? No - copy requested No - copy requested   Copy requested from family Copy requested  from family  Would patient like information on creating a medical advance directive?    No - patient declined information      Current Medications (verified) Outpatient Encounter Medications as of 12/22/2021  Medication Sig   amLODipine (NORVASC) 10 MG tablet TAKE 1 TABLET DAILY   aspirin 81 MG tablet Take 1 tablet (81 mg total) by mouth daily.   B-D ULTRAFINE III SHORT PEN 31G X 8 MM MISC Inject 1 Syringe into the skin daily.   Biotin 1000 MCG tablet Take 1,000 mcg by mouth every evening.    calcium-vitamin D (OSCAL WITH D) 500-200 MG-UNIT tablet Take 1 tablet by mouth.   glucagon 1 MG injection Inject 1 mg into the vein once as needed (Low blood sugar).    Insulin Disposable Pump (OMNIPOD DASH PODS, GEN 4,) MISC Inject into the skin.   insulin glargine (LANTUS SOLOSTAR) 100 UNIT/ML Solostar Pen Inject 70 Units into the skin daily.   Insulin Human (INSULIN PUMP) 100 unit/ml SOLN Inject into the skin continuous. Humalog; basal rate: 85-100 units per day.   lisinopril (ZESTRIL) 40 MG tablet TAKE 1 TABLET DAILY   Magnesium 500 MG TABS Take 1 tablet by mouth daily.   metFORMIN (GLUCOPHAGE) 500 MG tablet Take 1,000 mg by mouth 2 (two) times daily with a meal.   metoprolol succinate (TOPROL-XL) 50 MG 24 hr tablet TAKE 1 TABLET DAILY WITH OR IMMEDIATELY FOLLOWING A MEAL   Multiple Vitamins-Minerals (ICAPS MV PO) Take 1 capsule by mouth every evening.   nitroGLYCERIN (NITROSTAT) 0.4  MG SL tablet Place 0.4 mg under the tongue every 5 (five) minutes as needed for chest pain.    NOVOLIN R RELION 100 UNIT/ML injection USE AS DIRECTED VIA INSULIN PUMP UP TO 100 UNITS DAILY   Omega-3 Fatty Acids (FISH OIL) 1200 MG CAPS Take 1,200 mg by mouth daily. (Patient not taking: Reported on 06/04/2021)   Potassium 99 MG TABS Take by mouth.   Semaglutide,0.25 or 0.'5MG'$ /DOS, (OZEMPIC, 0.25 OR 0.5 MG/DOSE,) 2 MG/1.5ML SOPN Titrate 0.25 mg once weekly for 4 weeks then 0.5 mg once weekly for 2 weeks   simvastatin (ZOCOR)  20 MG tablet TAKE 1 TABLET DAILY   vitamin E 400 UNIT capsule Take 400 Units by mouth every evening.   Zinc 22.5 MG TABS Take 1 tablet by mouth in the morning and at bedtime.   No facility-administered encounter medications on file as of 12/22/2021.    Allergies (verified) Patient has no known allergies.   History: Past Medical History:  Diagnosis Date   ASTHMA 06/08/2008   CAD (coronary artery disease)    a. s/p Promus DES (3.0 x 16 mm) to mid LAD 05/16/10;  b. Myoview 05/15/10: anteroseptal ischemia with EF 52%;   c. Cath 05/16/10: single vessel CAD with mLAD 90% tx with PCI and EF 50%;  d. 07/2011 Cath:  Patent stent, nonobs dzs, NL EF.   Chronic kidney disease    Colonic polyp    DIABETES MELLITUS, TYPE II 06/08/2008   GERD (gastroesophageal reflux disease)    Hemorrhoids    HYPERLIPIDEMIA 06/08/2008   HYPERTENSION 06/08/2008   Impotence of organic origin 08/24/2008   Myocardial infarction ([place]) 04/2010   Pneumonia    hx of   Past Surgical History:  Procedure Laterality Date   CORONARY ANGIOPLASTY WITH STENT PLACEMENT     LEFT HEART CATHETERIZATION WITH CORONARY ANGIOGRAM N/A 07/29/2011   Procedure: LEFT HEART CATHETERIZATION WITH CORONARY ANGIOGRAM;  Surgeon: Burnell Blanks, MD;  [facility] C[facility] LAB;  Service: Cardiovascular;  Laterality: N/A;   LEFT HEART CATHETERIZATION WITH CORONARY ANGIOGRAM N/A 06/14/2013   Procedure: LEFT HEART CATHETERIZATION WITH CORONARY ANGIOGRAM;  Surgeon: Burnell Blanks, MD;  Location: [facility] CATH LAB;  Service: Cardiovascular;  Laterality: N/A;   Family History  Problem Relation Age of Onset   Diabetes Father    Heart disease Father    Angina Father    Dementia Mother    Social History   Socioeconomic History   Marital status: Married    Spouse name: Not on file   Number of children: 2   Years of education: Not on file   Highest education level: Not on file  Occupational History   Occupation: Sales  Tobacco Use   Smoking status:  Former    Packs/day: 2.00    Years: 53.00    Total pack years: 106.00    Types: Cigarettes    Quit date: 05/23/2002    Years since quitting: 19.5   Smokeless tobacco: Never  Vaping Use   Vaping Use: Never used  Substance and Sexual Activity   Alcohol use: Yes    Alcohol/week: 14.0 standard drinks of alcohol    Types: 14 Shots of liquor per week    Comment: one drink a day   Drug use: No   Sexual activity: Not Currently  Other Topics Concern   Not on file  Social History Narrative   Not on file   Social Determinants of Health   Financial Resource Strain: Low Risk  (  12/22/2021)   Overall Financial Resource Strain (CARDIA)    Difficulty of Paying Living Expenses: Not hard at all  Food Insecurity: No Food Insecurity (12/22/2021)   Hunger Vital Sign    Worried About Running Out of Food in the Last Year: Never true    Ran Out of Food in the Last Year: Never true  Transportation Needs: No Transportation Needs (12/22/2021)   PRAPARE - Hydrologist (Medical): No    Lack of Transportation (Non-Medical): No  Physical Activity: Insufficiently Active (12/22/2021)   Exercise Vital Sign    Days of Exercise per Week: 3 days    Minutes of Exercise per Session: 30 min  Stress: No Stress Concern Present (12/22/2021)   [place]    Feeling of Stress : Not at all  Social Connections: [place] (12/22/2021)   Social Connection and Isolation Panel [NHANES]    Frequency of Communication with Friends and Family: More than three times a week    Frequency of Social Gatherings with Friends and Family: More than three times a week    Attends Religious Services: More than 4 times per year    Active Member of [employer] or Organizations: Yes    Attends Music therapist: More than 4 times per year    Marital Status: Married    Tobacco Counseling Counseling given: Not Answered   Clinical  Intake: Nutrition Risk Assessment:  Has the patient had any N/V/D within the last 2 months?  No  Does the patient have any non-healing wounds?  No  Has the patient had any unintentional weight loss or weight gain?  No   Diabetes:  Is the patient diabetic?  Yes  If diabetic, was a CBG obtained today?  Yes CBG 172 Taken by patient Did the patient bring in their glucometer from home?  No  How often do you monitor your CBG's? 4 X Daily.   Financial Strains and Diabetes Management:  Are you having any financial strains with the device, your supplies or your medication? No .  Does the patient want to be seen by Chronic Care Management for management of their diabetes?  No  Would the patient like to be referred to a Nutritionist or for Diabetic Management?  No   Diabetic Exams:  Diabetic Eye Exam: Completed No. Overdue for diabetic eye exam. Pt has been advised about the importance in completing this exam. A referral has been placed today. Message sent to referral coordinator for scheduling purposes. Advised pt to expect a call from office referred to regarding appt.  Diabetic Foot Exam: Completed No. Pt has been advised about the importance in completing this exam. Pt is scheduled for diabetic foot exam on Followed by Dr Buddy Duty.   Pre-visit preparation completed: No  Pain : No/denies pain     BMI - recorded: 35.44 Nutritional Status: BMI > 30  Obese Nutritional Risks: None Diabetes: Yes CBG done?: Yes (CBG 172 Taken by patient) CBG resulted in Enter/ Edit results?: Yes Did pt. bring in CBG monitor from home?: No  How often do you need to have someone help you when you read instructions, pamphlets, or other written materials from your doctor or pharmacy?: 1 - Never  Diabetic?  Yes  Interpreter Needed?: No  Information entered by :: Rolene Arbour LPN   Activities of Daily Living    12/22/2021    8:34 AM  In your present state of health,  do you have any difficulty performing  the following activities:  Hearing? 0  Vision? 0  Difficulty concentrating or making decisions? 0  Walking or climbing stairs? 0  Dressing or bathing? 0  Doing errands, shopping? 0  Preparing Food and eating ? N  Using the Toilet? N  In the past six months, have you accidently leaked urine? N  Do you have problems with loss of bowel control? N  Managing your Medications? N  Managing your Finances? N  Housekeeping or managing your Housekeeping? N    Patient Care Team: [name], MD as PCP - General [name] Annita Brod, MD as PCP - Cardiology (Cardiology) Delrae Rend, MD as Attending Physician (Endocrinology)  Indicate any recent Medical Services you may have received from other than [facility] providers in the past year (date may be approximate).     Assessment:   This is a routine wellness examination for [place].  Hearing/Vision screen Hearing Screening - Comments:: Denies hearing difficulties   Vision Screening - Comments:: Wears rx glasses - up to date with routine eye exams with  Dr Sabra Heck  Dietary issues and exercise activities discussed: Current Exercise Habits: Home exercise routine, Type of exercise: walking, Time (Minutes): 30, Frequency (Times/Week): 3, Weekly Exercise (Minutes/Week): 90, Intensity: Moderate, Exercise limited by: None identified   Goals Addressed               This Visit's Progress     Stay Healthy (pt-stated)         Depression Screen    12/22/2021    8:33 AM 12/17/2020    8:37 AM 12/17/2020    8:34 AM 11/29/2019    2:31 PM 07/06/2017    8:49 AM 07/03/2016   10:48 AM 07/05/2015   11:49 AM  PHQ 2/9 Scores  PHQ - 2 Score 0 0 0 0 0 0 0    Fall Risk    12/22/2021    8:34 AM 12/21/2021    8:01 PM 12/17/2020    8:37 AM 02/06/2019   11:51 AM 07/06/2017    8:49 AM  Fall Risk   Falls in the past year? 0 0 0 0 No  Comment    Emmi Telephone Survey: data to providers prior to load   Number falls in past yr: 0  0    Injury with Fall?  0  0    Risk for fall due to : No Fall Risks      Follow up Falls prevention discussed  Falls evaluation completed      FALL RISK PREVENTION PERTAINING TO THE HOME:  Any stairs in or around the home? Yes  If so, are there any without handrails? No  Home free of loose throw rugs in walkways, pet beds, electrical cords, etc? Yes  Adequate lighting in your home to reduce risk of falls? Yes   ASSISTIVE DEVICES UTILIZED TO PREVENT FALLS:  Life alert? No  Use of a cane, walker or w/c? No  Grab bars in the bathroom? No  Shower chair or bench in shower? No  Elevated toilet seat or a handicapped toilet? No   TIMED UP AND GO:  Was the test performed? No . Audio Visit   Cognitive Function:        12/22/2021    8:36 AM  6CIT Screen  What Year? 0 points  What month? 0 points  What time? 0 points  Count back from 20 0 points  Months in reverse 0 points  Repeat  phrase 0 points  Total Score 0 points    Immunizations Immunization History  Administered Date(s) Administered   Influenza Split 12/28/2011, 12/26/2012   Influenza, High Dose Seasonal PF 12/13/2016, 12/03/2017, 12/06/2018, 11/26/2019   Influenza-Unspecified 12/31/2015   Moderna Sars-Covid-2 Vaccination 04/14/2019, 05/12/2019   Pneumococcal Conjugate-13 06/29/2014   Pneumococcal Polysaccharide-23 03/16/2005, 12/14/2009, 07/05/2015   Td 03/16/2005   Tdap 07/05/2015   Zoster, Live 08/06/2010, 06/14/2013    TDAP status: Up to date  Flu Vaccine status: Up to date  Pneumococcal vaccine status: Up to date  Covid-19 vaccine status: Completed vaccines  Qualifies for Shingles Vaccine? Yes   Zostavax completed No   Shingrix Completed?: No.    Education has been provided regarding the importance of this vaccine. Patient has been advised to call insurance company to determine out of pocket expense if they have not yet received this vaccine. Advised may also receive vaccine at local pharmacy or Health Dept. Verbalized  acceptance and understanding.  Screening Tests Health Maintenance  Topic Date Due   FOOT EXAM  Never done   HEMOGLOBIN A1C  03/28/2017   Diabetic kidney evaluation - GFR measurement  07/17/2018   Diabetic kidney evaluation - Urine ACR  10/25/2021   COVID-19 Vaccine (5 - Moderna series) 01/07/2022 (Originally 04/02/2021)   Zoster Vaccines- Shingrix (1 of 2) 03/24/2022 (Originally 05/10/1999)   INFLUENZA VACCINE  06/14/2022 (Originally 10/14/2021)   OPHTHALMOLOGY EXAM  12/04/2022   TETANUS/TDAP  07/04/2025   COLONOSCOPY (Pts 45-23yr Insurance coverage will need to be confirmed)  06/05/2026   Pneumonia Vaccine 71 Years old  Completed   Hepatitis C Screening  Completed   HPV VACCINES  Aged Out    Health Maintenance  Health Maintenance Due  Topic Date Due   FOOT EXAM  Never done   HEMOGLOBIN A1C  03/28/2017   Diabetic kidney evaluation - GFR measurement  07/17/2018   Diabetic kidney evaluation - Urine ACR  10/25/2021    Colorectal cancer screening: Type of screening: Colonoscopy. Completed 06/04/21. Repeat every 5 years  Lung Cancer Screening: (Low Dose CT Chest recommended if Age 72-80years, 30 pack-year currently smoking OR have quit w/in 15years.) does not qualify.     Additional Screening:  Hepatitis C Screening: does qualify; Completed 07/03/16  Vision Screening: Recommended annual ophthalmology exams for early detection of glaucoma and other disorders of the eye. Is the patient up to date with their annual eye exam?  Yes  Who is the provider or what is the name of the office in which the patient attends annual eye exams? Dr MSabra HeckIf pt is not established with a provider, would they like to be referred to a provider to establish care? No .   Dental Screening: Recommended annual dental exams for proper oral hygiene  Community Resource Referral / Chronic Care Management:   CRR required this visit?  No   CCM required this visit?  No      Plan:     I have  personally reviewed and noted the following in the patient's chart:   Medical and social history Use of alcohol, tobacco or illicit drugs  Current medications and supplements including opioid prescriptions. Patient is not currently taking opioid prescriptions. Functional ability and status Nutritional status Physical activity Advanced directives List of other physicians Hospitalizations, surgeries, and ER visits in previous 12 months Vitals Screenings to include cognitive, depression, and falls Referrals and appointments  In addition, I have reviewed and discussed with patient certain preventive protocols, quality metrics,  and best practice recommendations. A written personalized care plan for preventive services as well as general preventive health recommendations were provided to patient.     Criselda Peaches, LPN   [id number]/03/[id number]   Nurse Notes:Patient due Hemoglobin A1C,  Diabetic Kidney evaluation-Urine ACR and GFR measurement

## 2021-12-22 NOTE — Patient Instructions (Addendum)
Joel Williams , Thank you for taking time to come for your Medicare Wellness Visit. I appreciate your ongoing commitment to your health goals. Please review the following plan we discussed and let me know if I can assist you in the future.   These are the goals we discussed:  Goals       Stay Healthy (pt-stated)      Weight (lb) < 235 lb (106.6 kg)      May try to start doing some of the things that made you successful in the past Triggers that make you slide off the wagon., becomes to difficult  May consider grass fed beef  You can grill vegetables; (flat top grill )  Can pick better options at [facility] place, you can eat a garden salad with barbecue salad         This is a list of the screening recommended for you and due dates:  Health Maintenance  Topic Date Due   Complete foot exam   Never done   Hemoglobin A1C  03/28/2017   Yearly kidney function blood test for diabetes  07/17/2018   Yearly kidney health urinalysis for diabetes  10/25/2021   COVID-19 Vaccine (5 - Moderna series) 01/07/2022*   Zoster (Shingles) Vaccine (1 of 2) 03/24/2022*   Flu Shot  06/14/2022*   Eye exam for diabetics  12/04/2022   Tetanus Vaccine  07/04/2025   Colon Cancer Screening  06/05/2026   Pneumonia Vaccine  Completed   Hepatitis C Screening: USPSTF Recommendation to screen - Ages 18-79 yo.  Completed   HPV Vaccine  Aged Out  *Topic was postponed. The date shown is not the original due date.    Advanced directives: Please bring a copy of your health care power of attorney and living will to the office to be added to your chart at your convenience.   Conditions/risks identified: None  Next appointment: Follow up in one year for your annual wellness visit.    Preventive Care 72 Years and Older, Male  Preventive care refers to lifestyle choices and visits with your health care provider that can promote health and wellness. What does preventive care include? A yearly physical exam.  This is also called an annual well check. Dental exams once or twice a year. Routine eye exams. Ask your health care provider how often you should have your eyes checked. Personal lifestyle choices, including: Daily care of your teeth and gums. Regular physical activity. Eating a healthy diet. Avoiding tobacco and drug use. Limiting alcohol use. Practicing safe sex. Taking low doses of aspirin every day. Taking vitamin and mineral supplements as recommended by your health care provider. What happens during an annual well check? The services and screenings done by your health care provider during your annual well check will depend on your age, overall health, lifestyle risk factors, and family history of disease. Counseling  Your health care provider may ask you questions about your: Alcohol use. Tobacco use. Drug use. Emotional well-being. Home and relationship well-being. Sexual activity. Eating habits. History of falls. Memory and ability to understand (cognition). Work and work Statistician. Screening  You may have the following tests or measurements: Height, weight, and BMI. Blood pressure. Lipid and cholesterol levels. These may be checked every 5 years, or more frequently if you are over 23 years old. Skin check. Lung cancer screening. You may have this screening every year starting at age 72 if you have a 30-pack-year history of smoking and currently smoke  or have quit within the past 15 years. Fecal occult blood test (FOBT) of the stool. You may have this test every year starting at age 72. Flexible sigmoidoscopy or colonoscopy. You may have a sigmoidoscopy every 5 years or a colonoscopy every 10 years starting at age 72. Prostate cancer screening. Recommendations will vary depending on your family history and other risks. Hepatitis C blood test. Hepatitis B blood test. Sexually transmitted disease (STD) testing. Diabetes screening. This is done by checking your blood  sugar (glucose) after you have not eaten for a while (fasting). You may have this done every 1-3 years. Abdominal aortic aneurysm (AAA) screening. You may need this if you are a current or former smoker. Osteoporosis. You may be screened starting at age 72 if you are at high risk. Talk with your health care provider about your test results, treatment options, and if necessary, the need for more tests. Vaccines  Your health care provider may recommend certain vaccines, such as: Influenza vaccine. This is recommended every year. Tetanus, diphtheria, and acellular pertussis (Tdap, Td) vaccine. You may need a Td booster every 10 years. Zoster vaccine. You may need this after age 72. Pneumococcal 13-valent conjugate (PCV13) vaccine. One dose is recommended after age 72. Pneumococcal polysaccharide (PPSV23) vaccine. One dose is recommended after age 72. Talk to your health care provider about which screenings and vaccines you need and how often you need them. This information is not intended to replace advice given to you by your health care provider. Make sure you discuss any questions you have with your health care provider. Document Released: 03/29/2015 Document Revised: 11/20/2015 Document Reviewed: 01/01/2015 Elsevier Interactive Patient Education  2017 [place] Prevention in the Home Falls can cause injuries. They can happen to people of all ages. There are many things you can do to make your home safe and to help prevent falls. What can I do on the outside of my home? Regularly fix the edges of walkways and driveways and fix any cracks. Remove anything that might make you trip as you walk through a door, such as a raised step or threshold. Trim any bushes or trees on the path to your home. Use bright outdoor lighting. Clear any walking paths of anything that might make someone trip, such as rocks or tools. Regularly check to see if handrails are loose or broken. Make sure that  both sides of any steps have handrails. Any raised decks and porches should have guardrails on the edges. Have any leaves, snow, or ice cleared regularly. Use sand or salt on walking paths during winter. Clean up any spills in your garage right away. This includes oil or grease spills. What can I do in the bathroom? Use night lights. Install grab bars by the toilet and in the tub and shower. Do not use towel bars as grab bars. Use non-skid mats or decals in the tub or shower. If you need to sit down in the shower, use a plastic, non-slip stool. Keep the floor dry. Clean up any water that spills on the floor as soon as it happens. Remove soap buildup in the tub or shower regularly. Attach bath mats securely with double-sided non-slip rug tape. Do not have throw rugs and other things on the floor that can make you trip. What can I do in the bedroom? Use night lights. Make sure that you have a light by your bed that is easy to reach. Do not use any sheets or blankets  that are too big for your bed. They should not hang down onto the floor. Have a firm chair that has side arms. You can use this for support while you get dressed. Do not have throw rugs and other things on the floor that can make you trip. What can I do in the kitchen? Clean up any spills right away. Avoid walking on wet floors. Keep items that you use a lot in easy-to-reach places. If you need to reach something above you, use a strong step stool that has a grab bar. Keep electrical cords out of the way. Do not use floor polish or wax that makes floors slippery. If you must use wax, use non-skid floor wax. Do not have throw rugs and other things on the floor that can make you trip. What can I do with my stairs? Do not leave any items on the stairs. Make sure that there are handrails on both sides of the stairs and use them. Fix handrails that are broken or loose. Make sure that handrails are as long as the stairways. Check  any carpeting to make sure that it is firmly attached to the stairs. Fix any carpet that is loose or worn. Avoid having throw rugs at the top or bottom of the stairs. If you do have throw rugs, attach them to the floor with carpet tape. Make sure that you have a light switch at the top of the stairs and the bottom of the stairs. If you do not have them, ask someone to add them for you. What else can I do to help prevent falls? Wear shoes that: Do not have high heels. Have rubber bottoms. Are comfortable and fit you well. Are closed at the toe. Do not wear sandals. If you use a stepladder: Make sure that it is fully opened. Do not climb a closed stepladder. Make sure that both sides of the stepladder are locked into place. Ask someone to hold it for you, if possible. Clearly mark and make sure that you can see: Any grab bars or handrails. First and last steps. Where the edge of each step is. Use tools that help you move around (mobility aids) if they are needed. These include: Canes. Walkers. Scooters. Crutches. Turn on the lights when you go into a dark area. Replace any light bulbs as soon as they burn out. Set up your furniture so you have a clear path. Avoid moving your furniture around. If any of your floors are uneven, fix them. If there are any pets around you, be aware of where they are. Review your medicines with your doctor. Some medicines can make you feel dizzy. This can increase your chance of falling. Ask your doctor what other things that you can do to help prevent falls. This information is not intended to replace advice given to you by your health care provider. Make sure you discuss any questions you have with your health care provider. Document Released: 12/27/2008 Document Revised: 08/08/2015 Document Reviewed: 04/06/2014 Elsevier Interactive Patient Education  2017 [employer].

## 2022-02-04 DIAGNOSIS — E1142 Type 2 diabetes mellitus with diabetic polyneuropathy: Secondary | ICD-10-CM | POA: Diagnosis not present

## 2022-02-04 DIAGNOSIS — Z794 Long term (current) use of insulin: Secondary | ICD-10-CM | POA: Diagnosis not present

## 2022-02-04 DIAGNOSIS — I2510 Atherosclerotic heart disease of native coronary artery without angina pectoris: Secondary | ICD-10-CM | POA: Diagnosis not present

## 2022-02-04 DIAGNOSIS — Z23 Encounter for immunization: Secondary | ICD-10-CM | POA: Diagnosis not present

## 2022-02-04 DIAGNOSIS — E291 Testicular hypofunction: Secondary | ICD-10-CM | POA: Diagnosis not present

## 2022-02-24 DIAGNOSIS — H18413 Arcus senilis, bilateral: Secondary | ICD-10-CM | POA: Diagnosis not present

## 2022-02-24 DIAGNOSIS — H2511 Age-related nuclear cataract, right eye: Secondary | ICD-10-CM | POA: Diagnosis not present

## 2022-02-24 DIAGNOSIS — H25043 Posterior subcapsular polar age-related cataract, bilateral: Secondary | ICD-10-CM | POA: Diagnosis not present

## 2022-02-24 DIAGNOSIS — H25013 Cortical age-related cataract, bilateral: Secondary | ICD-10-CM | POA: Diagnosis not present

## 2022-02-24 DIAGNOSIS — H2513 Age-related nuclear cataract, bilateral: Secondary | ICD-10-CM | POA: Diagnosis not present

## 2022-04-28 DIAGNOSIS — M5416 Radiculopathy, lumbar region: Secondary | ICD-10-CM | POA: Diagnosis not present

## 2022-05-01 ENCOUNTER — Other Ambulatory Visit: Payer: Self-pay | Admitting: Cardiovascular Disease

## 2022-05-04 ENCOUNTER — Other Ambulatory Visit (HOSPITAL_COMMUNITY): Payer: Self-pay | Admitting: Orthopedic Surgery

## 2022-05-04 DIAGNOSIS — M545 Low back pain, unspecified: Secondary | ICD-10-CM

## 2022-05-06 DIAGNOSIS — Z794 Long term (current) use of insulin: Secondary | ICD-10-CM | POA: Diagnosis not present

## 2022-05-06 DIAGNOSIS — I2510 Atherosclerotic heart disease of native coronary artery without angina pectoris: Secondary | ICD-10-CM | POA: Diagnosis not present

## 2022-05-06 DIAGNOSIS — E291 Testicular hypofunction: Secondary | ICD-10-CM | POA: Diagnosis not present

## 2022-05-06 DIAGNOSIS — E1142 Type 2 diabetes mellitus with diabetic polyneuropathy: Secondary | ICD-10-CM | POA: Diagnosis not present

## 2022-05-10 DIAGNOSIS — J019 Acute sinusitis, unspecified: Secondary | ICD-10-CM | POA: Diagnosis not present

## 2022-05-17 ENCOUNTER — Other Ambulatory Visit: Payer: Self-pay | Admitting: Cardiovascular Disease

## 2022-05-26 NOTE — Telephone Encounter (Signed)
Received Rx Response that meds refills were denied due to patient needing appointment.  Called patient and left a message to call back to schedule an appointment.

## 2022-05-27 DIAGNOSIS — H2511 Age-related nuclear cataract, right eye: Secondary | ICD-10-CM | POA: Diagnosis not present

## 2022-05-28 DIAGNOSIS — H2512 Age-related nuclear cataract, left eye: Secondary | ICD-10-CM | POA: Diagnosis not present

## 2022-06-03 DIAGNOSIS — H2511 Age-related nuclear cataract, right eye: Secondary | ICD-10-CM | POA: Diagnosis not present

## 2022-06-03 DIAGNOSIS — Z961 Presence of intraocular lens: Secondary | ICD-10-CM | POA: Diagnosis not present

## 2022-06-03 DIAGNOSIS — Z9841 Cataract extraction status, right eye: Secondary | ICD-10-CM | POA: Diagnosis not present

## 2022-06-03 NOTE — Progress Notes (Signed)
. Cardiology Office Note:    Date:  06/10/2022   ID:  Joel Williams, DOB 06-22-1950, MRN [id number]  PCP:  Joel Post, MD  [facility] Cardiologist:  Lauree Chandler, MD     Referring MD: Joel Post, MD   Chief Complaint:  Follow-up     History of Present Illness:   Joel Williams is a 72 y.o. male  with history of CAD status Williams DES to the LAD in 2012, repeat cath 07/2011 patent stent, abnormal Lexiscan followed by cardiac cath 06/14/13 patent LAD stent no disease in the small RCA or circumflex.  Echo 2017 normal LV function mild MR grade 2 DD.  Has chronic dyspnea on exertion, hypertension, HLD, IDDM.No AAA by u/s October 2021.   Patient last saw Dr. [name] 04/2021 and was cleared for colonoscopy.   Patient comes in for yearly f/u. Having back pain and had an MRI yest. They did an EKG and told him he had a new RBBB but this is an old finding. Had cataract surgery. Can walk 1/2 mile every other day. Denies chest pain, dyspnea, palpitations, dizziness, presyncope. A1C 7.5 in Feb down from over 8. BP controlled at home. Lost about 10 lbs on ozempic. Has curbed his desire alcohol.          Past Medical History:  Diagnosis Date   Allergic rhinitis    Arthritis    ASTHMA 06/08/2008   Asthma    as a child   CAD (coronary artery disease)    a. s/p Promus DES (3.0 x 16 mm) to mid LAD 05/16/10;  b. Myoview 05/15/10: anteroseptal ischemia with EF 52%;   c. Cath 05/16/10: single vessel CAD with mLAD 90% tx with PCI and EF 50%;  d. 07/2011 Cath:  Patent stent, nonobs dzs, NL EF.   Chronic kidney disease    was seen by Dr. Jimmy Footman and released.   Colonic polyp    DIABETES MELLITUS, TYPE II 06/08/2008   GERD (gastroesophageal reflux disease)    Hemorrhoids    HYPERLIPIDEMIA 06/08/2008   HYPERTENSION 06/08/2008   Impotence of organic origin 08/24/2008   Myocardial infarction [facility]) 04/16/2010   Pneumonia    hx of   Renal vein thrombosis (HCC)     Seasonal allergies    Current Medications: Current Meds  Medication Sig   aspirin 81 MG tablet Take 1 tablet (81 mg total) by mouth daily.   B-D ULTRAFINE III SHORT PEN 31G X 8 MM MISC Inject 1 Syringe into the skin daily.   Biotin 1000 MCG tablet Take 1,000 mcg by mouth every evening.    Cholecalciferol (VITAMIN D) 50 MCG (2000 UT) tablet Take 2,000 Units by mouth daily.   glucagon 1 MG injection Inject 1 mg into the vein once as needed (Low blood sugar).    Insulin Disposable Pump (OMNIPOD DASH PODS, GEN 4,) MISC Inject into the skin.   insulin glargine (LANTUS SOLOSTAR) 100 UNIT/ML Solostar Pen Inject 80 Units into the skin daily.  AM   Insulin Human (INSULIN PUMP) 100 unit/ml SOLN Inject into the skin continuous. Humalog; basal rate: 85-100 units per day.   ketorolac (ACULAR) 0.5 % ophthalmic solution Place 1 drop into the right eye 4 (four) times daily.   MAGNESIUM PO Take 1 tablet by mouth daily.   metFORMIN (GLUCOPHAGE-XR) 500 MG 24 hr tablet Take 1,000 mg by mouth 2 (two) times daily.   moxifloxacin (VIGAMOX) 0.5 % ophthalmic solution Place 1 drop  into the right eye 4 (four) times daily.   naproxen sodium (ALEVE) 220 MG tablet Take 440 mg by mouth 2 (two) times daily as needed (headaches).   nitroGLYCERIN (NITROSTAT) 0.4 MG SL tablet Place 0.4 mg under the tongue every 5 (five) minutes as needed for chest pain.    NOVOLIN R RELION 100 UNIT/ML injection USE AS DIRECTED VIA INSULIN PUMP   Potassium 99 MG TABS Take 99 mg by mouth every evening.   prednisoLONE acetate (PRED FORTE) 1 % ophthalmic suspension Place 1 drop into the right eye 4 (four) times daily.   Pyridoxine HCl (VITAMIN B6 PO) Take 500 mg by mouth daily.   Semaglutide, 1 MG/DOSE, (OZEMPIC, 1 MG/DOSE,) 2 MG/1.5ML SOPN Inject 1 mg into the skin every Monday.   vitamin E 400 UNIT capsule Take 400 Units by mouth every evening.   [DISCONTINUED] amLODipine (NORVASC) 10 MG tablet TAKE 1 TABLET DAILY   [DISCONTINUED]  lisinopril (ZESTRIL) 40 MG tablet TAKE 1 TABLET DAILY   [DISCONTINUED] metoprolol succinate (TOPROL-XL) 50 MG 24 hr tablet TAKE 1 TABLET DAILY WITH OR IMMEDIATELY FOLLOWING A MEAL   [DISCONTINUED] simvastatin (ZOCOR) 20 MG tablet TAKE 1 TABLET DAILY    Allergies:   Invokana [canagliflozin]   Social History   Tobacco Use   Smoking status: Former    Packs/day: 2.00    Years: 53.00    Additional pack years: 0.00    Total pack years: 106.00    Types: Cigarettes    Quit date: 05/23/2002    Years since quitting: 20.0   Smokeless tobacco: Never  Vaping Use   Vaping Use: Never used  Substance Use Topics   Alcohol use: Not Currently    Comment: Ocassional since starting Ozempic- fall 2023.   Drug use: No    Family Hx: The patient's family history includes Angina in his father; Dementia in his mother; Diabetes in his father; Heart disease in his father.  ROS     Physical Exam:    VS:  BP (!) 142/80   Pulse 89   Ht 5\' 9"  (1.753 m)   Wt 236 lb 3.2 oz (107.1 kg)   SpO2 97%   BMI 34.88 kg/m     Wt Readings from Last 3 Encounters:  06/10/22 236 lb 3.2 oz (107.1 kg)  06/09/22 260 lb (117.9 kg)  12/22/21 240 lb (108.9 kg)    Physical Exam  GEN: Obese, in no acute distress  Neck: no JVD, carotid bruits, or masses Cardiac:RRR; no murmurs, rubs, or gallops  Respiratory:  clear to auscultation bilaterally, normal work of breathing GI: soft, nontender, nondistended, + BS Ext: without cyanosis, clubbing, or edema, Good distal pulses bilaterally Neuro:  Alert and Oriented x 3,  Psych: euthymic mood, full affect        EKGs/Labs/Other Test Reviewed:    EKG:  EKG is  not ordered today.  The ekg from yest reviewed NSR with RBBB unchanged.  Recent Labs: 06/09/2022: BUN 11; Creatinine, Ser 0.66; Hemoglobin 14.5; Platelets 173; Potassium 3.6; Sodium 136   Recent Lipid Panel No results for input(s): "CHOL", "TRIG", "HDL", "VLDL", "LDLCALC", "LDLDIRECT" in the last 8760 hours.    Prior CV Studies: ECHO COMPLETE WO IMAGING ENHANCING AGENT 05/13/2021  Narrative ECHOCARDIOGRAM REPORT    Patient Name:   Joel Williams [facility] Date of Exam: 05/13/2021 Medical Rec #:  [id number]          Height:       69.0 in Accession #:  [id number]         Weight:       253.0 lb Date of Birth:  Mar 09, 1951          BSA:          2.283 m Patient Age:    67 years           BP:           148/80 mmHg Patient Gender: M                  HR:           78 bpm. Exam Location:  [place]  Procedure: 2D Echo, Cardiac Doppler, Color Doppler and Strain Analysis  Indications:    I25.8 CAD  History:        Patient has prior history of Echocardiogram examinations, most recent 01/27/2016. CHF, CAD, Arrythmias:Palpitation; Risk Factors:Hypertension, Diabetes, Dyslipidemia, Former Smoker and Obesity.  Sonographer:    Basilia Jumbo BS, RDCS Referring Phys: [place]   1. Left ventricular ejection fraction, by estimation, is 60 to 65%. The left ventricle has normal function. The left ventricle has no regional wall motion abnormalities. There is mild left ventricular hypertrophy. Left ventricular diastolic parameters were normal. The average left ventricular global longitudinal strain is -24.5 %. The global longitudinal strain is normal. 2. Right ventricular systolic function is normal. The right ventricular size is normal. Tricuspid regurgitation signal is inadequate for assessing PA pressure. 3. No evidence of mitral valve regurgitation. 4. The aortic valve is grossly normal. Aortic valve regurgitation is not visualized. 5. The inferior vena cava is normal in size with greater than 50% respiratory variability, suggesting right atrial pressure of 3 mmHg.  Comparison(s): 01/27/16 EF 60-65%.  Conclusion(s)/Recommendation(s): Normal biventricular function without evidence of hemodynamically significant valvular heart disease. Study form 2017 not  available.  FINDINGS Left Ventricle: Left ventricular ejection fraction, by estimation, is 60 to 65%. The left ventricle has normal function. The left ventricle has no regional wall motion abnormalities. The average left ventricular global longitudinal strain is -24.5 %. The global longitudinal strain is normal. The left ventricular internal cavity size was normal in size. There is mild left ventricular hypertrophy. Left ventricular diastolic parameters were normal.  Right Ventricle: The right ventricular size is normal. No increase in right ventricular wall thickness. Right ventricular systolic function is normal. Tricuspid regurgitation signal is inadequate for assessing PA pressure.  Left Atrium: Left atrial size was normal in size.  Right Atrium: Right atrial size was normal in size.  Pericardium: There is no evidence of pericardial effusion.  Mitral Valve: Mild mitral annular calcification. No evidence of mitral valve regurgitation.  Tricuspid Valve: The tricuspid valve is grossly normal. Tricuspid valve regurgitation is not demonstrated.  Aortic Valve: The aortic valve is grossly normal. Aortic valve regurgitation is not visualized.  Pulmonic Valve: The pulmonic valve was not well visualized. Pulmonic valve regurgitation is not visualized.  Aorta: The aortic root and ascending aorta are structurally normal, with no evidence of dilitation.  Venous: The inferior vena cava is normal in size with greater than 50% respiratory variability, suggesting right atrial pressure of 3 mmHg.  IAS/Shunts: No atrial level shunt detected by color flow Doppler.   LEFT VENTRICLE PLAX 2D LVIDd:         5.00 cm   Diastology LVIDs:         3.20 cm   LV e' medial:    6.53 cm/s LV PW:  1.20 cm   LV E/e' medial:  13.2 LV IVS:        1.40 cm   LV e' lateral:   14.30 cm/s LVOT diam:     2.40 cm   LV E/e' lateral: 6.0 LV SV:         100 LV SV Index:   44        2D Longitudinal Strain LVOT Area:      4.52 cm  2D Strain GLS (A2C):   -27.5 % 2D Strain GLS (A3C):   -20.7 % 2D Strain GLS (A4C):   -25.4 % 2D Strain GLS Avg:     -24.5 %  RIGHT VENTRICLE             IVC RV Basal diam:  3.50 cm     IVC diam: 1.40 cm RV S prime:     11.30 cm/s TAPSE (M-mode): 2.2 cm  LEFT ATRIUM             Index        RIGHT ATRIUM           Index LA diam:        4.10 cm 1.80 cm/m   RA Pressure: 3.00 mmHg LA Vol (A2C):   60.4 ml 26.46 ml/m  RA Area:     12.50 cm LA Vol (A4C):   54.1 ml 23.70 ml/m  RA Volume:   30.70 ml  13.45 ml/m LA Biplane Vol: 58.5 ml 25.62 ml/m AORTIC VALVE LVOT Vmax:   108.00 cm/s LVOT Vmean:  75.300 cm/s LVOT VTI:    0.221 m  AORTA Ao Root diam: 3.30 cm Ao Asc diam:  3.60 cm  MITRAL VALVE               TRICUSPID VALVE Estimated RAP:  3.00 mmHg MV Decel Time: 204 msec MV E velocity: 86.40 cm/s  SHUNTS MV A velocity: 77.60 cm/s  Systemic VTI:  0.22 m MV E/A ratio:  1.11        Systemic Diam: 2.40 cm  Phineas Inches Electronically signed by Phineas Inches Signature Date/Time: 05/13/2021/1:39:36 PM    Final    Echo 01/27/16: - Left ventricle: The cavity size was normal. There was mild focal   basal hypertrophy of the septum. Systolic function was normal.   The estimated ejection fraction was in the range of 60% to 65%.   Wall motion was normal; there were no regional wall motion   abnormalities. Features are consistent with a pseudonormal left   ventricular filling pattern, with concomitant abnormal relaxation   and increased filling pressure (grade 2 diastolic dysfunction). - Aortic valve: Trileaflet; mildly thickened, mildly calcified   leaflets. - Mitral valve: Calcified annulus. Mildly thickened leaflets .   There was mild regurgitation. - Left atrium: The atrium was mildly to moderately dilated.   Volume/bsa, ES, (1-plane Simpson&'s, A2C): 45.8 ml/m^2.   Cardiac cath 06/14/13: Left main: No obstructive disease.  Left Anterior Descending Artery: Moderate to  large caliber vessel that courses to the apex. The proximal stent is patent with no restenosis. The mid and distal vessel has no obstructive disease. There are two small caliber diagonal branches with no obstructive disease.  Circumflex Artery: Large caliber vessel with early intermediate branch followed by two moderate sized OM branches. No obstructive disease noted.  Right Coronary Artery: Small non-dominant vessel with no obstructive disease noted.  Left Ventricular Angiogram: LVEF 50%.  Impression:  1. Single vessel CAD with patent mid LAD  stent  2. Normal LV function     Risk Assessment/Calculations/Metrics:              ASSESSMENT & PLAN:   No problem-specific Assessment & Plan notes found for this encounter.   CAD without angina: No chest pain. He has CAD with prior LAD stent placement. Last cath 06/14/13 with stable disease.  Echo with normal LV systolic function [id number]. Will continue ASA, beta blocker, amlodipine, and statin.     HTN: BP is controlled. No changes    Hyperlipidemia: Lipids followed in Endocrine clinic. LDL 44 August 2023. Continue statin    Chronic diastolic CHF:  No volume overload on exam. Normal LVEF on echo 04/2021  Obesity-exercise and weight loss discussed. Has a bulging disc and may need back surgery in the near future.              Dispo:  No follow-ups on file.   Medication Adjustments/Labs and Tests Ordered: Current medicines are reviewed at length with the patient today.  Concerns regarding medicines are outlined above.  Tests Ordered: No orders of the defined types were placed in this encounter.  Medication Changes: Meds ordered this encounter  Medications   amLODipine (NORVASC) 10 MG tablet    Sig: Take 1 tablet (10 mg total) by mouth daily.    Dispense:  90 tablet    Refill:  3   lisinopril (ZESTRIL) 40 MG tablet    Sig: Take 1 tablet (40 mg total) by mouth daily.    Dispense:  90 tablet    Refill:  3   metoprolol succinate  (TOPROL-XL) 50 MG 24 hr tablet    Sig: Take with or immediately following a meal.    Dispense:  90 tablet    Refill:  3   simvastatin (ZOCOR) 20 MG tablet    Sig: Take 1 tablet (20 mg total) by mouth daily.    Dispense:  90 tablet    Refill:  3   Signed, [name], PA-C  06/10/2022 8:23 AM    [address], [place], [place]  [address] Phone: [phone]; Fax: [phone]

## 2022-06-08 ENCOUNTER — Other Ambulatory Visit: Payer: Self-pay

## 2022-06-08 ENCOUNTER — Encounter (HOSPITAL_COMMUNITY): Payer: Self-pay | Admitting: *Deleted

## 2022-06-08 NOTE — Progress Notes (Signed)
Anesthesia Chart Review: Joel Williams  Case: [id number] Date/Time: 06/09/22 0800   Procedure: MRI WITH ANESTHESIA OF LUMBAR SPINE WITHOUT CONTRAST   Anesthesia type: General   Pre-op diagnosis: LOW BACK PAIN   Location: [facility] OR RADIOLOGY ROOM / [facility]   Surgeons: Radiologist, Medication, MD       DISCUSSION: Patient is a 72 year old male scheduled for the above procedure.  MRI-L-spine ordered by [name], MDa, and H&P signed on 05/19/2022.  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   His yearly cardiology follow-up with [name], PA-C on 06/10/22. His primary cardiologist is Dr. [name] with last visit on 04/28/22 for follow-up and preoperative evaluation prior to colonoscopy. No CV symptoms at that time. Plavix discontinued. Known chronic diastolic CHF. Volume status stable, but echo updated to re-evaluate LV function--05/13/21 echo showed LVEF 60-65%, no regional wall motion abnormalities, mild LVH, normal diastolic parameters, normal RVSF. 12 month provider follow-up planned. He denied chest pain and SOB per PAT RN phone interview.   He is a same day work-up. Anesthesia team to evaluate on the day of surgery. He is for updated labs and EKG on arrival. By PAT RN notes, Ozempic was due on 06/08/22, but he was instructed to hold that dose. He does have a insulin pump (100 unit/mL) and glucose monitor. RN notes also indicates that he has chronic allergic rhinitis, but otherwise denied any respiratory symptoms recently.    VS: Ht 5\' 10"  (1.778 m)   Wt 117.9 kg   BMI 37.31 kg/m  BP Readings from Last 3 Encounters:  06/04/21 132/79  04/28/21 (!) 148/80  04/25/21 132/72   Pulse Readings from Last 3 Encounters:  06/04/21 74  04/28/21 66  04/25/21 74     PROVIDERS: [name], MD is PCP  Delrae Rend, MD is  endocrinologist Lauree Chandler, MD is cardiologist.   LABS: For day of procedure as indicated. Last results in Care Everywhere include A1c 7.5% on 05/06/22. On 11/02/21 ALP 67, AST 29, ALT 37, Cr 0.62, K 4.2.   EKG: For day of procedure. Last EKG 04/28/21: SR, RBBB.    CV: Echo 05/13/21: IMPRESSIONS   1. Left ventricular ejection fraction, by estimation, is 60 to 65%. The  left ventricle has normal function. The left ventricle has no regional  wall motion abnormalities. There is mild left ventricular hypertrophy.  Left ventricular diastolic parameters  were normal. The average left ventricular global longitudinal strain is  -24.5 %. The global longitudinal strain is normal.   2. Right ventricular systolic function is normal. The right ventricular  size is normal. Tricuspid regurgitation signal is inadequate for assessing  PA pressure.   3. No evidence of mitral valve regurgitation.   4. The aortic valve is grossly normal. Aortic valve regurgitation is not  visualized.   5. The inferior vena cava is normal in size with greater than 50%  respiratory variability, suggesting right atrial pressure of 3 mmHg.   Comparison(s): 01/27/16 EF 60-65%.  Conclusion(s)/Recommendation(s): Normal biventricular function without  evidence of hemodynamically significant valvular heart disease. Study form  2017 not available.    [place] Abd Aorta 12/27/19: Summary:  Abdominal Aorta: No evidence of an abdominal aortic aneurysm was  visualized.  Stenosis:  Atherosclerosis noted throughout aorta and iliacs. No evidence of stenosis  seen.   IVC/Iliac: There is no evidence of thrombus  involving the IVC.       Cardiac cath 06/14/13 (following mildly abnormal stress test): Left main: No obstructive disease.  Left Anterior Descending Artery: Moderate to large caliber vessel that courses to the apex. The proximal stent is patent with no restenosis. The mid and distal vessel has no obstructive disease.  There are two small caliber diagonal branches with no obstructive disease.  Circumflex Artery: Large caliber vessel with early intermediate branch followed by two moderate sized OM branches. No obstructive disease noted.  Right Coronary Artery: Small non-dominant vessel with no obstructive disease noted.  Left Ventricular Angiogram: LVEF 50%.  Impression:  1. Single vessel CAD with patent mid LAD stent  2. Normal LV function   Holter monitor 09/10/10: SR. Rare PVCs/PACs, no VT or afib.   Past Medical History:  Diagnosis Date   Allergic rhinitis    Arthritis    ASTHMA 06/08/2008   Asthma    as a child   CAD (coronary artery disease)    a. s/p Promus DES (3.0 x 16 mm) to mid LAD 05/16/10;  b. Myoview 05/15/10: anteroseptal ischemia with EF 52%;   c. Cath 05/16/10: single vessel CAD with mLAD 90% tx with PCI and EF 50%;  d. 07/2011 Cath:  Patent stent, nonobs dzs, NL EF.   Chronic kidney disease    was seen by Dr. Jimmy Footman and released.   Colonic polyp    DIABETES MELLITUS, TYPE II 06/08/2008   GERD (gastroesophageal reflux disease)    Hemorrhoids    HYPERLIPIDEMIA 06/08/2008   HYPERTENSION 06/08/2008   Impotence of organic origin 08/24/2008   Myocardial infarction ([place]) 04/16/2010   Pneumonia    hx of   Renal vein thrombosis ([facility])    Seasonal allergies     Past Surgical History:  Procedure Laterality Date   CORONARY ANGIOPLASTY WITH STENT PLACEMENT     LEFT HEART CATHETERIZATION WITH CORONARY ANGIOGRAM N/A 07/29/2011   Procedure: LEFT HEART CATHETERIZATION WITH CORONARY ANGIOGRAM;  Surgeon: Burnell Blanks, MD;  Location: [facility] CATH LAB;  Service: Cardiovascular;  Laterality: N/A;   LEFT HEART CATHETERIZATION WITH CORONARY ANGIOGRAM N/A 06/14/2013   Procedure: LEFT HEART CATHETERIZATION WITH CORONARY ANGIOGRAM;  Surgeon: Burnell Blanks, MD;  Location: [facility] CATH LAB;  Service: Cardiovascular;  Laterality: N/A;    MEDICATIONS: No current facility-administered medications  for this encounter.    amLODipine (NORVASC) 10 MG tablet   aspirin 81 MG tablet   Biotin 1000 MCG tablet   Cholecalciferol (VITAMIN D) 50 MCG (2000 UT) tablet   glucagon 1 MG injection   insulin glargine (LANTUS SOLOSTAR) 100 UNIT/ML Solostar Pen   ketorolac (ACULAR) 0.5 % ophthalmic solution   lisinopril (ZESTRIL) 40 MG tablet   MAGNESIUM PO   metFORMIN (GLUCOPHAGE-XR) 500 MG 24 hr tablet   metoprolol succinate (TOPROL-XL) 50 MG 24 hr tablet   moxifloxacin (VIGAMOX) 0.5 % ophthalmic solution   naproxen sodium (ALEVE) 220 MG tablet   nitroGLYCERIN (NITROSTAT) 0.4 MG SL tablet   NOVOLIN R RELION 100 UNIT/ML injection   Potassium 99 MG TABS   prednisoLONE acetate (PRED FORTE) 1 % ophthalmic suspension   Pyridoxine HCl (VITAMIN B6 PO)   Semaglutide, 1 MG/DOSE, (OZEMPIC, 1 MG/DOSE,) 2 MG/1.5ML SOPN   simvastatin (ZOCOR) 20 MG tablet   vitamin E 400 UNIT capsule   B-D ULTRAFINE III SHORT PEN 31G X 8 MM MISC   Insulin Disposable Pump (OMNIPOD DASH PODS, GEN 4,) MISC   Insulin Human (INSULIN PUMP) 100 unit/ml SOLN  Myra Gianotti, PA-C Surgical Short Stay/Anesthesiology [facility] Phone [phone] [facility] Phone [phone] 06/08/2022 2:35 PM

## 2022-06-08 NOTE — Anesthesia Preprocedure Evaluation (Signed)
Anesthesia Evaluation  Patient identified by MRN, date of birth, ID band Patient awake    Reviewed: Allergy & Precautions, NPO status , Patient's Chart, lab work & pertinent test results, reviewed documented beta blocker date and time   Airway Mallampati: III  TM Distance: >3 FB Neck ROM: Full    Dental  (+) Dental Advisory Given, Teeth Intact   Pulmonary asthma , former smoker   Pulmonary exam normal breath sounds clear to auscultation       Cardiovascular hypertension, Pt. on home beta blockers and Pt. on medications + CAD, + Past MI, + Cardiac Stents and +CHF  Normal cardiovascular exam Rhythm:Regular Rate:Normal  Echo 05/13/21: IMPRESSIONS  1. Left ventricular ejection fraction, by estimation, is 60 to 65%. The  left ventricle has normal function. The left ventricle has no regional  wall motion abnormalities. There is mild left ventricular hypertrophy.  Left ventricular diastolic parameters  were normal. The average left ventricular global longitudinal strain is  -24.5 %. The global longitudinal strain is normal.  2. Right ventricular systolic function is normal. The right ventricular  size is normal. Tricuspid regurgitation signal is inadequate for assessing  PA pressure.  3. No evidence of mitral valve regurgitation.  4. The aortic valve is grossly normal. Aortic valve regurgitation is not  visualized.  5. The inferior vena cava is normal in size with greater than 50%  respiratory variability, suggesting right atrial pressure of 3 mmHg.   Comparison(s): 01/27/16 EF 60-65%.  Conclusion(s)/Recommendation(s): Normal biventricular function without  evidence of hemodynamically significant valvular heart disease. Study form  2017 not available.       Neuro/Psych    GI/Hepatic ,GERD  ,,  Endo/Other  diabetes (insulin pump, currently removed), Insulin Dependent    Renal/GU Renal InsufficiencyRenal disease      Musculoskeletal  (+) Arthritis ,    Abdominal   Peds  Hematology   Anesthesia Other Findings History includes former smoker (quit 05/23/02), HTN, HLD, DM2, asthma, CKD, CAD (NSTEMI, DES LAD 05/16/10), GERD, childhood asthma, renal vein thrombosis (no evidence of IVC thrombosis 12/27/19 [place]).   Reproductive/Obstetrics                             Anesthesia Physical Anesthesia Plan  ASA: 3  Anesthesia Plan: MAC   Post-op Pain Management: Minimal or no pain anticipated   Induction: Intravenous  PONV Risk Score and Plan: 1 and Ondansetron and Treatment may vary due to age or medical condition  Airway Management Planned: Natural Airway and Simple Face Mask  Additional Equipment:   Intra-op Plan:   Post-operative Plan:   Informed Consent: I have reviewed the patients History and Physical, chart, labs and discussed the procedure including the risks, benefits and alternatives for the proposed anesthesia with the patient or authorized representative who has indicated his/her understanding and acceptance.     Dental advisory given  Plan Discussed with: CRNA  Anesthesia Plan Comments: (PAT note written 06/08/2022 by Myra Gianotti, PA-C.  )       Anesthesia Quick Evaluation

## 2022-06-08 NOTE — Progress Notes (Addendum)
I spoke with Lelan Pons, diabetic coordinator regarding Mr. Kozicki Insulin Pump ad glucose reader,  Lelan Pons confirm with me that Insulin Pump and Glucose reader are to be removed before MRI, and to bring new ones to replace later. I called Mr. Rought and instructed him to not take Ozempic this am. Mr. Mcgaffey has type II diabetes.  Patient that CBG in am is often 190~, after eating 180~,  last A1C was 7.5 on 05/06/22 at Dr. Cindra Eves office  I instructed Mr. Romelle Starcher to not take take Metformin in am, if CBG is > 70 take 1/2 of Lantus dose- 40 units. I instructed patient to check CBG after awaking and every 2 hours until arrival  to the hospital.  I Instructed patient if CBG is less than 70 to  drink 1/2 cup of a clear juice. Recheck CBG in 15 minutes if CBG is not over 70 call, pre- op desk at [phone] for further instructions. If scheduled to receive Insulin, do not take Insulin. Dr Buddy Duty is patient's endocrinologist , Mr. Brockert sees him every 3 months. Patient said he would remove Insulin Pump and Glucose reader before he leaves home. I instructed Mr. Sweezey to decrease Basal rate of Insulin by 20% at midnight.  Mr. Verdene Lennert PCP is Dr. Carolann Littler, cardiologist is Dr. [name], patient sees every year; patient has an appointment March 06/10/22, we will do an EKG DOS as last one was in February 2023.  Mr. Birdsong denies chest pain or shortness of breath. Patient denies having any s/s of Covid in his household, also denies any known exposure to Covid. Mr. Hershman also denies any s/s of upper or lower respiratory infection, patient states he has a chronic runny nose due to seasonal allergies.

## 2022-06-09 ENCOUNTER — Encounter (HOSPITAL_COMMUNITY): Payer: Self-pay | Admitting: Cardiovascular Disease

## 2022-06-09 ENCOUNTER — Ambulatory Visit (HOSPITAL_COMMUNITY)
Admission: RE | Admit: 2022-06-09 | Discharge: 2022-06-09 | Disposition: A | Discharge status: Home / Self Care | Payer: Medicare Other | Attending: Cardiovascular Disease | Admitting: Cardiovascular Disease

## 2022-06-09 ENCOUNTER — Ambulatory Visit (HOSPITAL_BASED_OUTPATIENT_CLINIC_OR_DEPARTMENT_OTHER): Payer: Medicare Other | Admitting: Vascular Surgery

## 2022-06-09 ENCOUNTER — Encounter (HOSPITAL_COMMUNITY)
Admission: RE | Disposition: A | Discharge status: Home / Self Care | Payer: Self-pay | Source: Home / Self Care | Attending: Cardiovascular Disease

## 2022-06-09 ENCOUNTER — Ambulatory Visit (HOSPITAL_COMMUNITY)
Admission: RE | Admit: 2022-06-09 | Discharge: 2022-06-09 | Disposition: A | Discharge status: Home / Self Care | Payer: Medicare Other | Source: Ambulatory Visit | Attending: Orthopedic Surgery | Admitting: Orthopedic Surgery

## 2022-06-09 ENCOUNTER — Ambulatory Visit (HOSPITAL_COMMUNITY): Payer: Medicare Other | Admitting: Vascular Surgery

## 2022-06-09 DIAGNOSIS — I252 Old myocardial infarction: Secondary | ICD-10-CM | POA: Insufficient documentation

## 2022-06-09 DIAGNOSIS — E785 Hyperlipidemia, unspecified: Secondary | ICD-10-CM | POA: Insufficient documentation

## 2022-06-09 DIAGNOSIS — Z87891 Personal history of nicotine dependence: Secondary | ICD-10-CM | POA: Diagnosis not present

## 2022-06-09 DIAGNOSIS — M5137 Other intervertebral disc degeneration, lumbosacral region: Secondary | ICD-10-CM | POA: Insufficient documentation

## 2022-06-09 DIAGNOSIS — M545 Low back pain, unspecified: Secondary | ICD-10-CM

## 2022-06-09 DIAGNOSIS — Z794 Long term (current) use of insulin: Secondary | ICD-10-CM | POA: Diagnosis not present

## 2022-06-09 DIAGNOSIS — I509 Heart failure, unspecified: Secondary | ICD-10-CM | POA: Diagnosis not present

## 2022-06-09 DIAGNOSIS — N189 Chronic kidney disease, unspecified: Secondary | ICD-10-CM | POA: Insufficient documentation

## 2022-06-09 DIAGNOSIS — M48061 Spinal stenosis, lumbar region without neurogenic claudication: Secondary | ICD-10-CM | POA: Insufficient documentation

## 2022-06-09 DIAGNOSIS — M5136 Other intervertebral disc degeneration, lumbar region: Secondary | ICD-10-CM | POA: Diagnosis not present

## 2022-06-09 DIAGNOSIS — I130 Hypertensive heart and chronic kidney disease with heart failure and stage 1 through stage 4 chronic kidney disease, or unspecified chronic kidney disease: Secondary | ICD-10-CM | POA: Insufficient documentation

## 2022-06-09 DIAGNOSIS — I2510 Atherosclerotic heart disease of native coronary artery without angina pectoris: Secondary | ICD-10-CM | POA: Insufficient documentation

## 2022-06-09 DIAGNOSIS — M5127 Other intervertebral disc displacement, lumbosacral region: Secondary | ICD-10-CM | POA: Diagnosis not present

## 2022-06-09 DIAGNOSIS — J45909 Unspecified asthma, uncomplicated: Secondary | ICD-10-CM | POA: Insufficient documentation

## 2022-06-09 DIAGNOSIS — I110 Hypertensive heart disease with heart failure: Secondary | ICD-10-CM

## 2022-06-09 DIAGNOSIS — M4807 Spinal stenosis, lumbosacral region: Secondary | ICD-10-CM | POA: Diagnosis not present

## 2022-06-09 DIAGNOSIS — E1122 Type 2 diabetes mellitus with diabetic chronic kidney disease: Secondary | ICD-10-CM | POA: Insufficient documentation

## 2022-06-09 DIAGNOSIS — I5032 Chronic diastolic (congestive) heart failure: Secondary | ICD-10-CM | POA: Diagnosis not present

## 2022-06-09 DIAGNOSIS — M5126 Other intervertebral disc displacement, lumbar region: Secondary | ICD-10-CM | POA: Diagnosis not present

## 2022-06-09 DIAGNOSIS — Z955 Presence of coronary angioplasty implant and graft: Secondary | ICD-10-CM | POA: Diagnosis not present

## 2022-06-09 HISTORY — DX: Embolism and thrombosis of renal vein: I82.3

## 2022-06-09 HISTORY — DX: Other seasonal allergic rhinitis: J30.2

## 2022-06-09 HISTORY — DX: Unspecified osteoarthritis, unspecified site: M19.90

## 2022-06-09 HISTORY — DX: Allergic rhinitis, unspecified: J30.9

## 2022-06-09 HISTORY — DX: Unspecified asthma, uncomplicated: J45.909

## 2022-06-09 HISTORY — PX: RADIOLOGY WITH ANESTHESIA: SHX6223

## 2022-06-09 LAB — BASIC METABOLIC PANEL
Anion gap: 14 (ref 5–15)
BUN: 11 mg/dL (ref 8–23)
CO2: 21 mmol/L — ABNORMAL LOW (ref 22–32)
Calcium: 9 mg/dL (ref 8.9–10.3)
Chloride: 101 mmol/L (ref 98–111)
Creatinine, Ser: 0.66 mg/dL (ref 0.61–1.24)
GFR, Estimated: >60 mL/min (ref >60)
Glucose, Bld: 227 mg/dL — ABNORMAL HIGH (ref 70–99)
Potassium: 3.6 mmol/L (ref 3.5–5.1)
Sodium: 136 mmol/L (ref 135–145)

## 2022-06-09 LAB — CBC
HCT: 42.2 % (ref 39.0–52.0)
Hemoglobin: 14.5 g/dL (ref 13.0–17.0)
MCH: 27.9 pg (ref 26.0–34.0)
MCHC: 34.4 g/dL (ref 30.0–36.0)
MCV: 81.2 fL (ref 80.0–100.0)
Platelets: 173 10*3/uL (ref 150–400)
RBC: 5.2 MIL/uL (ref 4.22–5.81)
RDW: 13.5 % (ref 11.5–15.5)
WBC: 5.1 10*3/uL (ref 4.0–10.5)
nRBC: 0 % (ref 0.0–0.2)

## 2022-06-09 LAB — GLUCOSE, CAPILLARY
Glucose-Capillary: 225 mg/dL — ABNORMAL HIGH (ref 70–99)
Glucose-Capillary: 208 mg/dL — ABNORMAL HIGH (ref 70–99)

## 2022-06-09 SURGERY — MRI WITH ANESTHESIA
Anesthesia: Monitor Anesthesia Care

## 2022-06-09 MED ORDER — FENTANYL CITRATE (PF) 100 MCG/2ML IJ SOLN
INTRAMUSCULAR | Status: DC | PRN
  Administered 2022-06-09: 50 ug via INTRAVENOUS

## 2022-06-09 MED ORDER — INSULIN ASPART 100 UNIT/ML IJ SOLN
0.0000 [IU] | INTRAMUSCULAR | Status: DC | PRN
  Administered 2022-06-09: 3 [IU] via SUBCUTANEOUS
  Filled 2022-06-09: qty 1

## 2022-06-09 MED ORDER — ONDANSETRON HCL 4 MG/2ML IJ SOLN
INTRAMUSCULAR | Status: DC | PRN
  Administered 2022-06-09: 4 mg via INTRAVENOUS

## 2022-06-09 MED ORDER — ORAL CARE MOUTH RINSE: 15.0000 mL | Freq: Once | OROMUCOSAL | Status: AC

## 2022-06-09 MED ORDER — CHLORHEXIDINE GLUCONATE 0.12 % MT SOLN
15.0000 mL | Freq: Once | OROMUCOSAL | Status: AC
  Administered 2022-06-09: 15 mL via OROMUCOSAL
  Filled 2022-06-09: qty 15

## 2022-06-09 MED ORDER — LACTATED RINGERS IV SOLN: INTRAVENOUS | Status: DC

## 2022-06-09 MED ORDER — MIDAZOLAM HCL 5 MG/5ML IJ SOLN
INTRAMUSCULAR | Status: DC | PRN
  Administered 2022-06-09: 2 mg via INTRAVENOUS

## 2022-06-09 NOTE — Transfer of Care (Signed)
Immediate Anesthesia Transfer of Care Note  Patient: Joel Williams  Procedure(s) Performed: MRI WITH ANESTHESIA OF LUMBAR SPINE WITHOUT CONTRAST  Patient Location: PACU  Anesthesia Type:MAC  Level of Consciousness: awake, alert , and oriented  Airway & Oxygen Therapy: Patient Spontanous Breathing  Post-op Assessment: Report given to RN and Post -op Vital signs reviewed and stable  Post vital signs: Reviewed and stable  Last Vitals:  Vitals Value Taken Time  BP 151/90 06/09/22 0921  Temp 36.5 C 06/09/22 0920  Pulse 81 06/09/22 0925  Resp 15 06/09/22 0925  SpO2 94 % 06/09/22 0925  Vitals shown include unvalidated device data.  Last Pain:  Vitals:   06/09/22 0920  TempSrc:   PainSc: 0-No pain         Complications: No notable events documented.

## 2022-06-09 NOTE — Anesthesia Postprocedure Evaluation (Signed)
Anesthesia Post Note  Patient: Joel Williams  Procedure(s) Performed: MRI WITH ANESTHESIA OF LUMBAR SPINE WITHOUT CONTRAST     Patient location during evaluation: PACU Anesthesia Type: MAC Level of consciousness: awake and alert Pain management: pain level controlled Vital Signs Assessment: post-procedure vital signs reviewed and stable Respiratory status: spontaneous breathing, nonlabored ventilation, respiratory function stable and patient connected to nasal cannula oxygen Cardiovascular status: stable and blood pressure returned to baseline Postop Assessment: no apparent nausea or vomiting Anesthetic complications: no  No notable events documented.  Last Vitals:  Vitals:   06/09/22 0930 06/09/22 0937  BP: (!) 150/87 (!) 150/85  Pulse: 81   Resp: 14 15  Temp:  36.5 C  SpO2: 93%     Last Pain:  Vitals:   06/09/22 0937  TempSrc:   PainSc: 0-No pain                 Babygirl Trager L Daryan Cagley

## 2022-06-10 ENCOUNTER — Ambulatory Visit: Payer: Medicare Other | Attending: Physician Assistant | Admitting: Physician Assistant

## 2022-06-10 ENCOUNTER — Encounter: Payer: Self-pay | Admitting: Physician Assistant

## 2022-06-10 VITALS — BP 142/80 | HR 89 | Ht 69.0 in | Wt 236.2 lb

## 2022-06-10 DIAGNOSIS — E782 Mixed hyperlipidemia: Secondary | ICD-10-CM | POA: Insufficient documentation

## 2022-06-10 DIAGNOSIS — E669 Obesity, unspecified: Secondary | ICD-10-CM | POA: Insufficient documentation

## 2022-06-10 DIAGNOSIS — I10 Essential (primary) hypertension: Secondary | ICD-10-CM | POA: Insufficient documentation

## 2022-06-10 DIAGNOSIS — I2510 Atherosclerotic heart disease of native coronary artery without angina pectoris: Secondary | ICD-10-CM | POA: Diagnosis not present

## 2022-06-10 DIAGNOSIS — I5032 Chronic diastolic (congestive) heart failure: Secondary | ICD-10-CM | POA: Diagnosis not present

## 2022-06-10 MED ORDER — LISINOPRIL 40 MG PO TABS: 40.0000 mg | ORAL_TABLET | Freq: Every day | ORAL | 3 refills | Status: DC

## 2022-06-10 MED ORDER — AMLODIPINE BESYLATE 10 MG PO TABS: 10.0000 mg | ORAL_TABLET | Freq: Every day | ORAL | 3 refills | Status: DC

## 2022-06-10 MED ORDER — SIMVASTATIN 20 MG PO TABS: 20.0000 mg | ORAL_TABLET | Freq: Every day | ORAL | 3 refills | Status: DC

## 2022-06-10 MED ORDER — METOPROLOL SUCCINATE ER 50 MG PO TB24: ORAL_TABLET | ORAL | 3 refills | Status: DC

## 2022-06-10 NOTE — Patient Instructions (Signed)
Medication Instructions:  Your physician recommends that you continue on your current medications as directed. Please refer to the Current Medication list given to you today.   *If you need a refill on your cardiac medications before your next appointment, please call your pharmacy*   Lab Work: None ordered   If you have labs (blood work) drawn today and your tests are completely normal, you will receive your results only by: Laura (if you have MyChart) OR A paper copy in the mail If you have any lab test that is abnormal or we need to change your treatment, we will call you to review the results.   Testing/Procedures: None ordered    Follow-Up: At [facility], you and your health needs are our priority.  As part of our continuing mission to provide you with exceptional heart care, we have created designated Provider Care Teams.  These Care Teams include your primary Cardiologist (physician) and Advanced Practice Providers (APPs -  Physician Assistants and Nurse Practitioners) who all work together to provide you with the care you need, when you need it.  We recommend signing up for the patient portal called "MyChart".  Sign up information is provided on this After Visit Summary.  MyChart is used to connect with patients for Virtual Visits (Telemedicine).  Patients are able to view lab/test results, encounter notes, upcoming appointments, etc.  Non-urgent messages can be sent to your provider as well.   To learn more about what you can do with MyChart, go to [email or web address].    Your next appointment:   12 month(s)  Provider:   Lauree Chandler, MD     Other Instructions Your physician has recommended that you get 150 minutes of exercise a week

## 2022-06-19 DIAGNOSIS — M545 Low back pain, unspecified: Secondary | ICD-10-CM | POA: Diagnosis not present

## 2022-06-24 DIAGNOSIS — H2512 Age-related nuclear cataract, left eye: Secondary | ICD-10-CM | POA: Diagnosis not present

## 2022-07-01 DIAGNOSIS — Z961 Presence of intraocular lens: Secondary | ICD-10-CM | POA: Diagnosis not present

## 2022-07-01 DIAGNOSIS — Z9841 Cataract extraction status, right eye: Secondary | ICD-10-CM | POA: Diagnosis not present

## 2022-07-01 DIAGNOSIS — Z9842 Cataract extraction status, left eye: Secondary | ICD-10-CM | POA: Diagnosis not present

## 2022-07-01 DIAGNOSIS — H2512 Age-related nuclear cataract, left eye: Secondary | ICD-10-CM | POA: Diagnosis not present

## 2022-08-04 DIAGNOSIS — E1142 Type 2 diabetes mellitus with diabetic polyneuropathy: Secondary | ICD-10-CM | POA: Diagnosis not present

## 2022-08-04 DIAGNOSIS — E291 Testicular hypofunction: Secondary | ICD-10-CM | POA: Diagnosis not present

## 2022-08-04 DIAGNOSIS — Z794 Long term (current) use of insulin: Secondary | ICD-10-CM | POA: Diagnosis not present

## 2022-08-04 DIAGNOSIS — I2510 Atherosclerotic heart disease of native coronary artery without angina pectoris: Secondary | ICD-10-CM | POA: Diagnosis not present

## 2022-09-24 DIAGNOSIS — H26491 Other secondary cataract, right eye: Secondary | ICD-10-CM | POA: Diagnosis not present

## 2022-09-24 DIAGNOSIS — H43813 Vitreous degeneration, bilateral: Secondary | ICD-10-CM | POA: Diagnosis not present

## 2022-09-24 DIAGNOSIS — Z961 Presence of intraocular lens: Secondary | ICD-10-CM | POA: Diagnosis not present

## 2022-09-24 DIAGNOSIS — H18413 Arcus senilis, bilateral: Secondary | ICD-10-CM | POA: Diagnosis not present

## 2022-09-24 DIAGNOSIS — H26493 Other secondary cataract, bilateral: Secondary | ICD-10-CM | POA: Diagnosis not present

## 2022-10-02 DIAGNOSIS — H5203 Hypermetropia, bilateral: Secondary | ICD-10-CM | POA: Diagnosis not present

## 2022-10-02 DIAGNOSIS — H524 Presbyopia: Secondary | ICD-10-CM | POA: Diagnosis not present

## 2022-10-02 DIAGNOSIS — H53001 Unspecified amblyopia, right eye: Secondary | ICD-10-CM | POA: Diagnosis not present

## 2022-10-02 DIAGNOSIS — H52223 Regular astigmatism, bilateral: Secondary | ICD-10-CM | POA: Diagnosis not present

## 2022-11-03 DIAGNOSIS — H26492 Other secondary cataract, left eye: Secondary | ICD-10-CM | POA: Diagnosis not present

## 2022-11-06 DIAGNOSIS — H8110 Benign paroxysmal vertigo, unspecified ear: Secondary | ICD-10-CM | POA: Diagnosis not present

## 2022-11-06 DIAGNOSIS — Z794 Long term (current) use of insulin: Secondary | ICD-10-CM | POA: Diagnosis not present

## 2022-11-06 DIAGNOSIS — I2510 Atherosclerotic heart disease of native coronary artery without angina pectoris: Secondary | ICD-10-CM | POA: Diagnosis not present

## 2022-11-06 DIAGNOSIS — E291 Testicular hypofunction: Secondary | ICD-10-CM | POA: Diagnosis not present

## 2022-11-06 DIAGNOSIS — E1142 Type 2 diabetes mellitus with diabetic polyneuropathy: Secondary | ICD-10-CM | POA: Diagnosis not present

## 2022-11-10 DIAGNOSIS — H2512 Age-related nuclear cataract, left eye: Secondary | ICD-10-CM | POA: Diagnosis not present

## 2022-11-10 DIAGNOSIS — Z9842 Cataract extraction status, left eye: Secondary | ICD-10-CM | POA: Diagnosis not present

## 2022-11-18 DIAGNOSIS — H8112 Benign paroxysmal vertigo, left ear: Secondary | ICD-10-CM | POA: Diagnosis not present

## 2022-11-23 DIAGNOSIS — H8112 Benign paroxysmal vertigo, left ear: Secondary | ICD-10-CM | POA: Diagnosis not present

## 2022-11-26 DIAGNOSIS — H8112 Benign paroxysmal vertigo, left ear: Secondary | ICD-10-CM | POA: Diagnosis not present

## 2022-12-04 DIAGNOSIS — H8112 Benign paroxysmal vertigo, left ear: Secondary | ICD-10-CM | POA: Diagnosis not present

## 2022-12-08 DIAGNOSIS — H8112 Benign paroxysmal vertigo, left ear: Secondary | ICD-10-CM | POA: Diagnosis not present

## 2022-12-11 DIAGNOSIS — H8112 Benign paroxysmal vertigo, left ear: Secondary | ICD-10-CM | POA: Diagnosis not present

## 2022-12-23 DIAGNOSIS — H8112 Benign paroxysmal vertigo, left ear: Secondary | ICD-10-CM | POA: Diagnosis not present

## 2022-12-25 ENCOUNTER — Ambulatory Visit: Payer: Medicare Other

## 2022-12-25 VITALS — Ht 69.0 in | Wt 236.0 lb

## 2022-12-25 DIAGNOSIS — Z0000 Encounter for general adult medical examination without abnormal findings: Secondary | ICD-10-CM

## 2022-12-25 NOTE — Patient Instructions (Addendum)
Joel Williams , Thank you for taking time to come for your Medicare Wellness Visit. I appreciate your ongoing commitment to your health goals. Please review the following plan we discussed and let me know if I can assist you in the future.   Referrals/Orders/Follow-Ups/Clinician Recommendations:   This is a list of the screening recommended for you and due dates:  Health Maintenance  Topic Date Due   Complete foot exam   Never done   Zoster (Shingles) Vaccine (1 of 2) 05/10/1999   Yearly kidney health urinalysis for diabetes  06/21/2015   Hemoglobin A1C  03/28/2017   Flu Shot  10/15/2022   COVID-19 Vaccine (5 - 2023-24 season) 11/15/2022   Eye exam for diabetics  12/04/2022   Yearly kidney function blood test for diabetes  06/09/2023   Medicare Annual Wellness Visit  12/25/2023   DTaP/Tdap/Td vaccine (3 - Td or Tdap) 07/04/2025   Colon Cancer Screening  06/05/2026   Pneumonia Vaccine  Completed   Hepatitis C Screening  Completed   HPV Vaccine  Aged Out    Advanced directives: (Copy Requested) Please bring a copy of your health care power of attorney and living will to the office to be added to your chart at your convenience.  Next Medicare Annual Wellness Visit scheduled for next year: Yes

## 2022-12-25 NOTE — Progress Notes (Signed)
Subjective:   Joel Williams is a 72 y.o. male who presents for Medicare Annual/Subsequent preventive examination.  Visit Complete: Virtual I connected with  Joel Williams on 12/25/22 by a audio enabled telemedicine application and verified that I am speaking with the correct person using two identifiers.  Patient Location: Home  Provider Location: Home Office  I discussed the limitations of evaluation and management by telemedicine. The patient expressed understanding and agreed to proceed.  Vital Signs: Because this visit was a virtual/telehealth visit, some criteria may be missing or patient reported. Any vitals not documented were not able to be obtained and vitals that have been documented are patient reported.    Cardiac Risk Factors include: advanced age (>79men, >63 women);diabetes mellitus;male gender     Objective:    Today's Vitals   12/25/22 0820  Weight: 236 lb (107 kg)  Height: 5\' 9"  (1.753 m)   Body mass index is 34.85 kg/m.     12/25/2022    8:27 AM 06/09/2022    6:57 AM 12/22/2021    8:36 AM 12/17/2020    8:36 AM 07/06/2017    8:47 AM 04/09/2014    7:28 AM 06/14/2013    9:25 AM  Advanced Directives  Does Patient Have a Medical Advance Directive? Yes Yes Yes Yes Yes No Patient has advance directive, copy not in chart  Type of Advance Directive Healthcare Power of [place];Living will Healthcare Power of [place];Living will Healthcare Power of [place];Living will Healthcare Power of [place];Living will   Living will;Healthcare Power of Attorney  Copy of Healthcare Power of Attorney in Chart? No - copy requested  No - copy requested No - copy requested   Copy requested from family  Would patient like information on creating a medical advance directive?      No - patient declined information     Current Medications (verified) Outpatient Encounter Medications as of 12/25/2022  Medication Sig   amLODipine (NORVASC) 10 MG tablet Take 1 tablet (10 mg  total) by mouth daily.   aspirin 81 MG tablet Take 1 tablet (81 mg total) by mouth daily.   B-D ULTRAFINE III SHORT PEN 31G X 8 MM MISC Inject 1 Syringe into the skin daily.   Biotin 1000 MCG tablet Take 1,000 mcg by mouth every evening.    Cholecalciferol (VITAMIN D) 50 MCG (2000 UT) tablet Take 2,000 Units by mouth daily.   glucagon 1 MG injection Inject 1 mg into the vein once as needed (Low blood sugar).    Insulin Disposable Pump (OMNIPOD DASH PODS, GEN 4,) MISC Inject into the skin.   insulin glargine (LANTUS SOLOSTAR) 100 UNIT/ML Solostar Pen Inject 80 Units into the skin daily.  AM   Insulin Human (INSULIN PUMP) 100 unit/ml SOLN Inject into the skin continuous. Humalog; basal rate: 85-100 units per day.   ketorolac (ACULAR) 0.5 % ophthalmic solution Place 1 drop into the right eye 4 (four) times daily.   lisinopril (ZESTRIL) 40 MG tablet Take 1 tablet (40 mg total) by mouth daily.   MAGNESIUM PO Take 1 tablet by mouth daily.   metFORMIN (GLUCOPHAGE-XR) 500 MG 24 hr tablet Take 1,000 mg by mouth 2 (two) times daily.   metoprolol succinate (TOPROL-XL) 50 MG 24 hr tablet Take with or immediately following a meal.   moxifloxacin (VIGAMOX) 0.5 % ophthalmic solution Place 1 drop into the right eye 4 (four) times daily.   naproxen sodium (ALEVE) 220 MG tablet Take 440 mg by mouth 2 (  two) times daily as needed (headaches).   nitroGLYCERIN (NITROSTAT) 0.4 MG SL tablet Place 0.4 mg under the tongue every 5 (five) minutes as needed for chest pain.    NOVOLIN R RELION 100 UNIT/ML injection USE AS DIRECTED VIA INSULIN PUMP   Potassium 99 MG TABS Take 99 mg by mouth every evening.   prednisoLONE acetate (PRED FORTE) 1 % ophthalmic suspension Place 1 drop into the right eye 4 (four) times daily.   Pyridoxine HCl (VITAMIN B6 PO) Take 500 mg by mouth daily.   Semaglutide, 1 MG/DOSE, (OZEMPIC, 1 MG/DOSE,) 2 MG/1.5ML SOPN Inject 1 mg into the skin every Monday.   simvastatin (ZOCOR) 20 MG tablet Take 1  tablet (20 mg total) by mouth daily.   vitamin E 400 UNIT capsule Take 400 Units by mouth every evening.   No facility-administered encounter medications on file as of 12/25/2022.    Allergies (verified) Invokana [canagliflozin]   History: Past Medical History:  Diagnosis Date   Allergic rhinitis    Arthritis    ASTHMA 06/08/2008   Asthma    as a child   CAD (coronary artery disease)    a. s/p Promus DES (3.0 x 16 mm) to mid LAD 05/16/10;  b. Myoview 05/15/10: anteroseptal ischemia with EF 52%;   c. Cath 05/16/10: single vessel CAD with mLAD 90% tx with PCI and EF 50%;  d. 07/2011 Cath:  Patent stent, nonobs dzs, NL EF.   Chronic kidney disease    was seen by Dr. Darrick Penna and released.   Colonic polyp    DIABETES MELLITUS, TYPE II 06/08/2008   GERD (gastroesophageal reflux disease)    Hemorrhoids    HYPERLIPIDEMIA 06/08/2008   HYPERTENSION 06/08/2008   Impotence of organic origin 08/24/2008   Myocardial infarction (HCC) 04/16/2010   Pneumonia    hx of   Renal vein thrombosis (HCC)    Seasonal allergies    Past Surgical History:  Procedure Laterality Date   CORONARY ANGIOPLASTY WITH STENT PLACEMENT     LEFT HEART CATHETERIZATION WITH CORONARY ANGIOGRAM N/A 07/29/2011   Procedure: LEFT HEART CATHETERIZATION WITH CORONARY ANGIOGRAM;  Surgeon: Kathleene Hazel, MD;  Location: [facility] CATH LAB;  Service: Cardiovascular;  Laterality: N/A;   LEFT HEART CATHETERIZATION WITH CORONARY ANGIOGRAM N/A 06/14/2013   Procedure: LEFT HEART CATHETERIZATION WITH CORONARY ANGIOGRAM;  Surgeon: Kathleene Hazel, MD;  Location: [facility] CATH LAB;  Service: Cardiovascular;  Laterality: N/A;   RADIOLOGY WITH ANESTHESIA N/A 06/09/2022   Procedure: MRI WITH ANESTHESIA OF LUMBAR SPINE WITHOUT CONTRAST;  Surgeon: Radiologist, Medication, MD;  Location: [facility] OR;  Service: Radiology;  Laterality: N/A;   Family History  Problem Relation Age of Onset   Diabetes Father    Heart disease Father    Angina Father     Dementia Mother    Social History   Socioeconomic History   Marital status: Married    Spouse name: Not on file   Number of children: 2   Years of education: Not on file   Highest education level: Not on file  Occupational History   Occupation: Sales  Tobacco Use   Smoking status: Former    Current packs/day: 0.00    Average packs/day: 2.0 packs/day for 53.0 years (106.0 ttl pk-yrs)    Types: Cigarettes    Start date: 05/22/1949    Quit date: 05/23/2002    Years since quitting: 20.6   Smokeless tobacco: Never  Vaping Use   Vaping status: Never Used  Substance and  Sexual Activity   Alcohol use: Not Currently    Comment: Ocassional since starting Ozempic- fall 2023.   Drug use: No   Sexual activity: Not Currently  Other Topics Concern   Not on file  Social History Narrative   Not on file   Social Determinants of Health   Financial Resource Strain: Low Risk  (12/25/2022)   Overall Financial Resource Strain (CARDIA)    Difficulty of Paying Living Expenses: Not hard at all  Food Insecurity: No Food Insecurity (12/25/2022)   Hunger Vital Sign    Worried About Running Out of Food in the Last Year: Never true    Ran Out of Food in the Last Year: Never true  Transportation Needs: No Transportation Needs (12/25/2022)   PRAPARE - Administrator, Civil Service (Medical): No    Lack of Transportation (Non-Medical): No  Physical Activity: Insufficiently Active (12/25/2022)   Exercise Vital Sign    Days of Exercise per Week: 2 days    Minutes of Exercise per Session: 30 min  Stress: No Stress Concern Present (12/25/2022)   [employer] of Occupational Health - Occupational Stress Questionnaire    Feeling of Stress : Not at all  Social Connections: Socially Integrated (12/25/2022)   Social Connection and Isolation Panel [NHANES]    Frequency of Communication with Friends and Family: More than three times a week    Frequency of Social Gatherings with Friends and  Family: More than three times a week    Attends Religious Services: More than 4 times per year    Active Member of [employer] or Organizations: Yes    Attends Engineer, structural: More than 4 times per year    Marital Status: Married    Tobacco Counseling Counseling given: Not Answered   Clinical Intake:  Pre-visit preparation completed: Yes  Pain : No/denies pain     BMI - recorded: 34.85 Nutritional Status: BMI > 30  Obese Nutritional Risks: None Diabetes: Yes CBG done?: Yes (CBG 151 Per patient) CBG resulted in Enter/ Edit results?: Yes Did pt. bring in CBG monitor from home?: No  How often do you need to have someone help you when you read instructions, pamphlets, or other written materials from your doctor or pharmacy?: 1 - Never  Interpreter Needed?: No  Information entered by :: Joel Mulligan LPN   Activities of Daily Living    12/25/2022    8:26 AM 06/09/2022    7:04 AM  In your present state of health, do you have any difficulty performing the following activities:  Hearing? 0   Vision? 0   Difficulty concentrating or making decisions? 0   Walking or climbing stairs? 0   Dressing or bathing? 0   Doing errands, shopping? 0 0  Preparing Food and eating ? N   Using the Toilet? N   In the past six months, have you accidently leaked urine? N   Do you have problems with loss of bowel control? N   Managing your Medications? N   Managing your Finances? N   Housekeeping or managing your Housekeeping? N     Patient Care Team: Kristian Covey, MD as PCP - General [place] James Nile Dear, MD as PCP - Cardiology (Cardiology) Talmage Coin, MD as Attending Physician (Endocrinology)  Indicate any recent Medical Services you may have received from other than [facility] providers in the past year (date may be approximate).     Assessment:   This is a routine wellness  examination for Joel Williams.  Hearing/Vision screen Hearing Screening - Comments:: Denies  hearing difficulties   Vision Screening - Comments:: Wears rx glasses - up to date with routine eye exams with  Dr Hyacinth Meeker   Goals Addressed               This Visit's Progress     Stay Active (pt-stated)         Depression Screen    12/25/2022    8:25 AM 12/22/2021    8:33 AM 12/17/2020    8:37 AM 12/17/2020    8:34 AM 11/29/2019    2:31 PM 07/06/2017    8:49 AM 07/03/2016   10:48 AM  PHQ 2/9 Scores  PHQ - 2 Score 0 0 0 0 0 0 0    Fall Risk    12/25/2022    8:26 AM 12/22/2021    8:34 AM 12/21/2021    8:01 PM 12/17/2020    8:37 AM 02/06/2019   11:51 AM  Fall Risk   Falls in the past year? 0 0 0 0 0  Comment     Emmi Telephone Survey: data to providers prior to load  Number falls in past yr: 0 0  0   Injury with Fall? 0 0  0   Risk for fall due to : No Fall Risks No Fall Risks     Follow up Falls prevention discussed Falls prevention discussed  Falls evaluation completed     MEDICARE RISK AT HOME: Medicare Risk at Home Any stairs in or around the home?: Yes If so, are there any without handrails?: No Home free of loose throw rugs in walkways, pet beds, electrical cords, etc?: Yes Adequate lighting in your home to reduce risk of falls?: Yes Life alert?: No Use of a cane, walker or w/c?: No Grab bars in the bathroom?: No Shower chair or bench in shower?: No Elevated toilet seat or a handicapped toilet?: No  TIMED UP AND GO:  Was the test performed?  No    Cognitive Function:    07/06/2017    8:56 AM  MMSE - Mini Mental State Exam  Not completed: --        12/25/2022    8:27 AM 12/22/2021    8:36 AM  6CIT Screen  What Year? 0 points 0 points  What month? 0 points 0 points  What time? 0 points 0 points  Count back from 20 0 points 0 points  Months in reverse 0 points 0 points  Repeat phrase 0 points 0 points  Total Score 0 points 0 points    Immunizations Immunization History  Administered Date(s) Administered   Influenza Split 12/28/2011,  12/26/2012   Influenza, High Dose Seasonal PF 12/13/2016, 12/03/2017, 12/06/2018, 11/26/2019   Influenza-Unspecified 12/31/2015   Moderna Sars-Covid-2 Vaccination 04/14/2019, 05/12/2019   Pneumococcal Conjugate-13 06/29/2014   Pneumococcal Polysaccharide-23 03/16/2005, 12/14/2009, 07/05/2015   Td 03/16/2005   Tdap 07/05/2015   Zoster, Live 08/06/2010, 06/14/2013    TDAP status: Up to date  Flu Vaccine status: Due, Education has been provided regarding the importance of this vaccine. Advised may receive this vaccine at local pharmacy or Health Dept. Aware to provide a copy of the vaccination record if obtained from local pharmacy or Health Dept. Verbalized acceptance and understanding.  Pneumococcal vaccine status: Up to date  Covid-19 vaccine status: Declined, Education has been provided regarding the importance of this vaccine but patient still declined. Advised may receive this vaccine at local pharmacy or  Health Dept.or vaccine clinic. Aware to provide a copy of the vaccination record if obtained from local pharmacy or Health Dept. Verbalized acceptance and understanding.  Qualifies for Shingles Vaccine? Yes   Zostavax completed No   Shingrix Completed?: No.    Education has been provided regarding the importance of this vaccine. Patient has been advised to call insurance company to determine out of pocket expense if they have not yet received this vaccine. Advised may also receive vaccine at local pharmacy or Health Dept. Verbalized acceptance and understanding.  Screening Tests Health Maintenance  Topic Date Due   FOOT EXAM  Never done   Zoster Vaccines- Shingrix (1 of 2) 05/10/1999   Diabetic kidney evaluation - Urine ACR  06/21/2015   HEMOGLOBIN A1C  03/28/2017   INFLUENZA VACCINE  10/15/2022   COVID-19 Vaccine (5 - 2023-24 season) 11/15/2022   OPHTHALMOLOGY EXAM  12/04/2022   Diabetic kidney evaluation - eGFR measurement  06/09/2023   Medicare Annual Wellness (AWV)   12/25/2023   DTaP/Tdap/Td (3 - Td or Tdap) 07/04/2025   Colonoscopy  06/05/2026   Pneumonia Vaccine 29+ Years old  Completed   Hepatitis C Screening  Completed   HPV VACCINES  Aged Out    Health Maintenance  Health Maintenance Due  Topic Date Due   FOOT EXAM  Never done   Zoster Vaccines- Shingrix (1 of 2) 05/10/1999   Diabetic kidney evaluation - Urine ACR  06/21/2015   HEMOGLOBIN A1C  03/28/2017   INFLUENZA VACCINE  10/15/2022   COVID-19 Vaccine (5 - 2023-24 season) 11/15/2022   OPHTHALMOLOGY EXAM  12/04/2022    Colorectal cancer screening: Type of screening: Colonoscopy. Completed 06/04/21. Repeat every 5 years    Additional Screening:  Hepatitis C Screening: does qualify; Completed 07/03/16  Vision Screening: Recommended annual ophthalmology exams for early detection of glaucoma and other disorders of the eye. Is the patient up to date with their annual eye exam?  Yes  Who is the provider or what is the name of the office in which the patient attends annual eye exams? Dr Hyacinth Meeker If pt is not established with a provider, would they like to be referred to a provider to establish care? No .   Dental Screening: Recommended annual dental exams for proper oral hygiene  Diabetic Foot Exam: Diabetic Foot Exam: Overdue, Pt has been advised about the importance in completing this exam. Pt is scheduled for diabetic foot exam on Followed by PCP.  Community Resource Referral / Chronic Care Management:  CRR required this visit?  No   CCM required this visit?  No     Plan:     I have personally reviewed and noted the following in the patient's chart:   Medical and social history Use of alcohol, tobacco or illicit drugs  Current medications and supplements including opioid prescriptions. Patient is not currently taking opioid prescriptions. Functional ability and status Nutritional status Physical activity Advanced directives List of other physicians Hospitalizations,  surgeries, and ER visits in previous 12 months Vitals Screenings to include cognitive, depression, and falls Referrals and appointments  In addition, I have reviewed and discussed with patient certain preventive protocols, quality metrics, and best practice recommendations. A written personalized care plan for preventive services as well as general preventive health recommendations were provided to patient.     Tillie Rung, LPN   [id number]   After Visit Summary: (MyChart) Due to this being a telephonic visit, the after visit summary with patients personalized plan was  offered to patient via MyChart   Nurse Notes: None

## 2023-02-03 DIAGNOSIS — Z794 Long term (current) use of insulin: Secondary | ICD-10-CM | POA: Diagnosis not present

## 2023-02-03 DIAGNOSIS — I2510 Atherosclerotic heart disease of native coronary artery without angina pectoris: Secondary | ICD-10-CM | POA: Diagnosis not present

## 2023-02-03 DIAGNOSIS — E291 Testicular hypofunction: Secondary | ICD-10-CM | POA: Diagnosis not present

## 2023-02-03 DIAGNOSIS — Z23 Encounter for immunization: Secondary | ICD-10-CM | POA: Diagnosis not present

## 2023-02-03 DIAGNOSIS — R2689 Other abnormalities of gait and mobility: Secondary | ICD-10-CM | POA: Diagnosis not present

## 2023-02-03 DIAGNOSIS — E119 Type 2 diabetes mellitus without complications: Secondary | ICD-10-CM | POA: Diagnosis not present

## 2023-02-10 DIAGNOSIS — R051 Acute cough: Secondary | ICD-10-CM | POA: Diagnosis not present

## 2023-02-10 DIAGNOSIS — H9202 Otalgia, left ear: Secondary | ICD-10-CM | POA: Diagnosis not present

## 2023-02-10 DIAGNOSIS — J020 Streptococcal pharyngitis: Secondary | ICD-10-CM | POA: Diagnosis not present

## 2023-03-23 DIAGNOSIS — J069 Acute upper respiratory infection, unspecified: Secondary | ICD-10-CM | POA: Diagnosis not present

## 2023-03-23 DIAGNOSIS — R051 Acute cough: Secondary | ICD-10-CM | POA: Diagnosis not present

## 2023-03-23 DIAGNOSIS — Z03818 Encounter for observation for suspected exposure to other biological agents ruled out: Secondary | ICD-10-CM | POA: Diagnosis not present

## 2023-03-30 DIAGNOSIS — J209 Acute bronchitis, unspecified: Secondary | ICD-10-CM | POA: Diagnosis not present

## 2023-03-30 DIAGNOSIS — R062 Wheezing: Secondary | ICD-10-CM | POA: Diagnosis not present

## 2023-05-03 DIAGNOSIS — E119 Type 2 diabetes mellitus without complications: Secondary | ICD-10-CM | POA: Diagnosis not present

## 2023-05-04 DIAGNOSIS — H53009 Unspecified amblyopia, unspecified eye: Secondary | ICD-10-CM | POA: Diagnosis not present

## 2023-05-04 DIAGNOSIS — H04213 Epiphora due to excess lacrimation, bilateral lacrimal glands: Secondary | ICD-10-CM | POA: Diagnosis not present

## 2023-05-04 DIAGNOSIS — E11319 Type 2 diabetes mellitus with unspecified diabetic retinopathy without macular edema: Secondary | ICD-10-CM | POA: Diagnosis not present

## 2023-05-04 DIAGNOSIS — Z961 Presence of intraocular lens: Secondary | ICD-10-CM | POA: Diagnosis not present

## 2023-05-04 LAB — HM DIABETES EYE EXAM

## 2023-05-10 DIAGNOSIS — Z794 Long term (current) use of insulin: Secondary | ICD-10-CM | POA: Diagnosis not present

## 2023-05-10 DIAGNOSIS — E119 Type 2 diabetes mellitus without complications: Secondary | ICD-10-CM | POA: Diagnosis not present

## 2023-05-10 DIAGNOSIS — I2510 Atherosclerotic heart disease of native coronary artery without angina pectoris: Secondary | ICD-10-CM | POA: Diagnosis not present

## 2023-05-10 DIAGNOSIS — E291 Testicular hypofunction: Secondary | ICD-10-CM | POA: Diagnosis not present

## 2023-08-11 DIAGNOSIS — E119 Type 2 diabetes mellitus without complications: Secondary | ICD-10-CM | POA: Diagnosis not present

## 2023-08-11 DIAGNOSIS — Z794 Long term (current) use of insulin: Secondary | ICD-10-CM | POA: Diagnosis not present

## 2023-08-11 DIAGNOSIS — I2510 Atherosclerotic heart disease of native coronary artery without angina pectoris: Secondary | ICD-10-CM | POA: Diagnosis not present

## 2023-08-11 DIAGNOSIS — E291 Testicular hypofunction: Secondary | ICD-10-CM | POA: Diagnosis not present

## 2023-08-19 ENCOUNTER — Ambulatory Visit (INDEPENDENT_AMBULATORY_CARE_PROVIDER_SITE_OTHER): Admitting: Student in an Organized Health Care Education/Training Program

## 2023-08-19 ENCOUNTER — Encounter: Payer: Self-pay | Admitting: Student in an Organized Health Care Education/Training Program

## 2023-08-19 VITALS — BP 118/73 | HR 93 | Ht 68.4 in | Wt 220.0 lb

## 2023-08-19 DIAGNOSIS — R5383 Other fatigue: Secondary | ICD-10-CM | POA: Diagnosis not present

## 2023-08-19 DIAGNOSIS — R399 Unspecified symptoms and signs involving the genitourinary system: Secondary | ICD-10-CM

## 2023-08-19 DIAGNOSIS — E119 Type 2 diabetes mellitus without complications: Secondary | ICD-10-CM | POA: Diagnosis not present

## 2023-08-19 DIAGNOSIS — Z7985 Long-term (current) use of injectable non-insulin antidiabetic drugs: Secondary | ICD-10-CM

## 2023-08-19 DIAGNOSIS — Z794 Long term (current) use of insulin: Secondary | ICD-10-CM | POA: Diagnosis not present

## 2023-08-19 DIAGNOSIS — I2510 Atherosclerotic heart disease of native coronary artery without angina pectoris: Secondary | ICD-10-CM

## 2023-08-19 DIAGNOSIS — E611 Iron deficiency: Secondary | ICD-10-CM

## 2023-08-19 DIAGNOSIS — I10 Essential (primary) hypertension: Secondary | ICD-10-CM | POA: Diagnosis not present

## 2023-08-19 LAB — COMPREHENSIVE METABOLIC PANEL WITH GFR
ALT: 20 U/L (ref 0–53)
AST: 21 U/L (ref 0–37)
Albumin: 4.4 g/dL (ref 3.5–5.2)
Alkaline Phosphatase: 59 U/L (ref 39–117)
BUN: 14 mg/dL (ref 6–23)
CO2: 29 meq/L (ref 19–32)
Calcium: 9.9 mg/dL (ref 8.4–10.5)
Chloride: 101 meq/L (ref 96–112)
Creatinine, Ser: 0.71 mg/dL (ref 0.40–1.50)
GFR: 90.53 mL/min (ref >60.00)
Glucose, Bld: 83 mg/dL (ref 70–99)
Potassium: 4.8 meq/L (ref 3.5–5.1)
Sodium: 141 meq/L (ref 135–145)
Total Bilirubin: 1.2 mg/dL (ref 0.2–1.2)
Total Protein: 7 g/dL (ref 6.0–8.3)

## 2023-08-19 LAB — IBC + FERRITIN
Ferritin: 44.2 ng/mL (ref 22.0–322.0)
Iron: 88 ug/dL (ref 42–165)
Saturation Ratios: 23.3 % (ref 20.0–50.0)
TIBC: 378 ug/dL (ref 250.0–450.0)
Transferrin: 270 mg/dL (ref 212.0–360.0)

## 2023-08-19 LAB — LIPID PANEL
Cholesterol: 102 mg/dL (ref 0–200)
HDL: 34.8 mg/dL — ABNORMAL LOW (ref >39.00)
LDL Cholesterol: 49 mg/dL (ref 0–99)
NonHDL: 67.2
Total CHOL/HDL Ratio: 3
Triglycerides: 91 mg/dL (ref 0.0–149.0)
VLDL: 18.2 mg/dL (ref 0.0–40.0)

## 2023-08-19 LAB — CBC
HCT: 42.2 % (ref 39.0–52.0)
Hemoglobin: 14.2 g/dL (ref 13.0–17.0)
MCHC: 33.7 g/dL (ref 30.0–36.0)
MCV: 81.3 fl (ref 78.0–100.0)
Platelets: 218 10*3/uL (ref 150.0–400.0)
RBC: 5.19 Mil/uL (ref 4.22–5.81)
RDW: 14.6 % (ref 11.5–15.5)
WBC: 6 10*3/uL (ref 4.0–10.5)

## 2023-08-19 LAB — VITAMIN B12: Vitamin B-12: 944 pg/mL — ABNORMAL HIGH (ref 211–911)

## 2023-08-19 NOTE — Assessment & Plan Note (Signed)
 Blood pressure well-controlled today.  He is having increasing fatigue, but orthostatic blood pressure today was normal.  Will plan to continue with amlodipine  and lisinopril .

## 2023-08-19 NOTE — Assessment & Plan Note (Signed)
 Increasing fatigue and declining exertional status over the last 4 months.  Patient with a history of colon polyps.  I suspect he may have iron deficiency.  Will check CBC and iron levels today.

## 2023-08-19 NOTE — Assessment & Plan Note (Signed)
 Chronic issue.  He had an acute myocardial infarction in 2012 which was treated with PCI to the mid LAD.  He is having symptoms of generalized fatigue and declining exertional abilities over the last 4 months.  No angina, but cannot rule out cardiac ischemia because of underlying diabetes.  EKG today reassuring.  If blood work does not reveal another culprit for the generalized fatigue, I am going to ask him to see his cardiologist and consider a CT angiography.  Patient's last ischemic evaluation with a nuclear medicine stress test was in 2015, and he reports not tolerating that procedure well.  For now we will continue medical management with aspirin , simvastatin , metoprolol , and lisinopril .

## 2023-08-19 NOTE — Assessment & Plan Note (Signed)
 New issue over the last 4 months.  Unclear etiology at this point.  Associated with unintentional weight loss of about 15 pounds.  Differential could include new anemia, renal dysfunction, iron deficiency, thyroid  dysfunction amongst many other things.  No issues with mood.  Exam today is normal, I do not think this is an issue of shortness of breath.  Will check labs today.  Like it checked today is the TSH because he has recently been using biotin.  I have to discuss using this medication and I can check the TSH in about 4 weeks.  If we do not find the culprits on this blood work, we will refer him for cardiac stress testing/CTA with his cardiologist given his history of ischemic CAD.

## 2023-08-19 NOTE — Assessment & Plan Note (Signed)
 Chronic and stable.  Managed with Dr. Kathyanne Parkers in endocrinology.  He reports last A1c was 7.1% very recently.  He uses insulin  through a OmniPod and a Dexcom CGM.  Uses metformin  with B12 supplementation.  Uses Ozempic over the last 2 years.  Has recently lost about 15 pounds over the last 6 months, which seems consistent with the effect of Ozempic.  Diabetes is complicated by ischemic vascular disease, mostly coronary artery disease.

## 2023-08-19 NOTE — Progress Notes (Signed)
 New Patient Office Visit  Subjective    Patient ID: Joel Williams, male    DOB: 12-17-1950  Age: 73 y.o. MRN: [id number]  CC:   Chief Complaint  Patient presents with   Establish Care    Patient states he has had extreme loss in appetite and has lost some weight. Food does not sound appealing to patient. Patient is wondering if it could be his gallbladder but is not having any pain in abdomin. Extreme fatigue. Sees endocrinologist in 6 months.     HPI  Joel RACZKOWSKI presents to establish care  73 year old person living with diabetes and ischemic heart disease here for evaluation of fatigue and has been bothering him over the last 4 months.  He is a retired person, used to work in Film/video editor, now active in his church.  Lives with his wife.  Reports over the last 4 months feeling significant amount of increasing fatigue that is very unusual for him.  Starting to affect his ability to do the tasks that he enjoys.  Reports that it he walks a long distance he at times dizzy.  Struggles to do tasks like mowing his yard or leading Bible studies.  Denies any chest pain or shortness of breath, no dyspnea with exertion.  Denies any low or depressed mood.  Reports adequate sleep.  Does endorse decreased appetite, about 15 pounds of weight loss over the last 6 months.  He has been on stable medications, no recent changes, including Ozempic for about 2 years.  Diabetes has been well-controlled by Dr. Kathyanne Parkers.  No fevers or chills, no night sweats.  No cough.  No abdominal pain.  He has moderate lower urinary tract symptoms and has about 3-4 episodes of nocturia nightly.   Outpatient Encounter Medications as of 08/19/2023  Medication Sig   albuterol  (VENTOLIN  HFA) 108 (90 Base) MCG/ACT inhaler Inhale 2 puffs into the lungs every 4 (four) hours.   amLODipine  (NORVASC ) 10 MG tablet Take 1 tablet (10 mg total) by mouth daily.   aspirin  81 MG tablet Take 1 tablet (81 mg total) by mouth daily.   B-D  ULTRAFINE III SHORT PEN 31G X 8 MM MISC Inject 1 Syringe into the skin daily.   Cholecalciferol (VITAMIN D) 50 MCG (2000 UT) tablet Take 2,000 Units by mouth daily.   Continuous Glucose Sensor (FREESTYLE LIBRE 3 PLUS SENSOR) MISC apply to upper back of arm for 90 days change sensor every 15 days IC-10 CODE: E11.9   Cyanocobalamin (VITAMIN B12) 1000 MCG TBCR 2 tablets Orally Once a day   glucagon 1 MG injection Inject 1 mg into the vein once as needed (Low blood sugar).    Insulin  Disposable Pump (OMNIPOD DASH PODS, GEN 4,) MISC Inject into the skin.   insulin  glargine (LANTUS SOLOSTAR) 100 UNIT/ML Solostar Pen Inject 80 Units into the skin daily.  AM   Insulin  Human (INSULIN  PUMP) 100 unit/ml SOLN Inject into the skin continuous. Humalog; basal rate: 85-100 units per day.   ketorolac (ACULAR) 0.5 % ophthalmic solution Place 1 drop into the right eye 4 (four) times daily.   lisinopril  (ZESTRIL ) 40 MG tablet Take 1 tablet (40 mg total) by mouth daily.   MAGNESIUM PO Take 1 tablet by mouth daily.   metFORMIN  (GLUCOPHAGE -XR) 500 MG 24 hr tablet Take 1,000 mg by mouth 2 (two) times daily.   metoprolol  succinate (TOPROL -XL) 50 MG 24 hr tablet Take with or immediately following a meal.   moxifloxacin (VIGAMOX)  0.5 % ophthalmic solution Place 1 drop into the right eye 4 (four) times daily.   naproxen sodium (ALEVE) 220 MG tablet Take 440 mg by mouth 2 (two) times daily as needed (headaches).   nitroGLYCERIN  (NITROSTAT ) 0.4 MG SL tablet Place 0.4 mg under the tongue every 5 (five) minutes as needed for chest pain.    NOVOLIN R RELION 100 UNIT/ML injection USE AS DIRECTED VIA INSULIN  PUMP   Potassium 99 MG TABS Take 99 mg by mouth every evening.   prednisoLONE acetate (PRED FORTE) 1 % ophthalmic suspension Place 1 drop into the right eye 4 (four) times daily.   Pyridoxine HCl (VITAMIN B6 PO) Take 500 mg by mouth daily.   Semaglutide, 1 MG/DOSE, (OZEMPIC, 1 MG/DOSE,) 2 MG/1.5ML SOPN Inject 1 mg into the  skin every Monday.   simvastatin  (ZOCOR ) 20 MG tablet Take 1 tablet (20 mg total) by mouth daily.   tadalafil  (CIALIS ) 20 MG tablet Take 20 mg by mouth daily as needed for erectile dysfunction.   vitamin E 400 UNIT capsule Take 400 Units by mouth every evening.   [DISCONTINUED] Biotin 1000 MCG tablet Take 1,000 mcg by mouth every evening.    No facility-administered encounter medications on file as of 08/19/2023.    Past Medical History:  Diagnosis Date   Allergic rhinitis    Arthritis    ASTHMA 06/08/2008   Asthma    as a child   CAD (coronary artery disease)    a. s/p Promus DES (3.0 x 16 mm) to mid LAD 05/16/10;  b. Myoview  05/15/10: anteroseptal ischemia with EF 52%;   c. Cath 05/16/10: single vessel CAD with mLAD 90% tx with PCI and EF 50%;  d. 07/2011 Cath:  Patent stent, nonobs dzs, NL EF.   Chronic kidney disease    was seen by Dr. Rolande Cleverly and released.   Colonic polyp    DIABETES MELLITUS, TYPE II 06/08/2008   GERD (gastroesophageal reflux disease)    Hemorrhoids    HYPERLIPIDEMIA 06/08/2008   HYPERTENSION 06/08/2008   Impotence of organic origin 08/24/2008   Myocardial infarction (HCC) 04/16/2010   Pneumonia    hx of   Renal vein thrombosis (HCC)    Seasonal allergies     Past Surgical History:  Procedure Laterality Date   CATARACT EXTRACTION Bilateral    2024   CORONARY ANGIOPLASTY WITH STENT PLACEMENT     LEFT HEART CATHETERIZATION WITH CORONARY ANGIOGRAM N/A 07/29/2011   Procedure: LEFT HEART CATHETERIZATION WITH CORONARY ANGIOGRAM;  Surgeon: Odie Benne, MD;  Location[facility] CATH LAB;  Service: Cardiovascular;  Laterality: N/A;   LEFT HEART CATHETERIZATION WITH CORONARY ANGIOGRAM N/A 06/14/2013   Procedure: LEFT HEART CATHETERIZATION WITH CORONARY ANGIOGRAM;  Surgeon: Odie Benne, MD;  Location: [facility] CATH LAB;  Service: Cardiovascular;  Laterality: N/A;   RADIOLOGY WITH ANESTHESIA N/A 06/09/2022   Procedure: MRI WITH ANESTHESIA OF LUMBAR SPINE  WITHOUT CONTRAST;  Surgeon: Radiologist, Medication, MD;  Location: [facility] OR;  Service: Radiology;  Laterality: N/A;    Family History  Problem Relation Age of Onset   Diabetes Father    Heart disease Father    Angina Father    Dementia Mother     Social History   Socioeconomic History   Marital status: Married    Spouse name: Not on file   Number of children: 2   Years of education: Not on file   Highest education level: Not on file  Occupational History   Occupation: Airline pilot  Tobacco Use   Smoking status: Former    Current packs/day: 0.00    Average packs/day: 2.0 packs/day for 53.0 years (106.0 ttl pk-yrs)    Types: Cigarettes    Start date: 05/22/1949    Quit date: 05/23/2002    Years since quitting: 21.2   Smokeless tobacco: Never  Vaping Use   Vaping status: Never Used  Substance and Sexual Activity   Alcohol use: Not Currently    Comment: Ocassional since starting Ozempic- fall 2023.   Drug use: No   Sexual activity: Not Currently  Other Topics Concern   Not on file  Social History Narrative   Not on file   Social Drivers of Health   Financial Resource Strain: Low Risk  (12/25/2022)   Overall Financial Resource Strain (CARDIA)    Difficulty of Paying Living Expenses: Not hard at all  Food Insecurity: No Food Insecurity (12/25/2022)   Hunger Vital Sign    Worried About Running Out of Food in the Last Year: Never true    Ran Out of Food in the Last Year: Never true  Transportation Needs: No Transportation Needs (12/25/2022)   PRAPARE - Administrator, Civil Service (Medical): No    Lack of Transportation (Non-Medical): No  Physical Activity: Insufficiently Active (12/25/2022)   Exercise Vital Sign    Days of Exercise per Week: 2 days    Minutes of Exercise per Session: 30 min  Stress: No Stress Concern Present (12/25/2022)   [employer] of Occupational Health - Occupational Stress Questionnaire    Feeling of Stress : Not at all  Social  Connections: Socially Integrated (12/25/2022)   Social Connection and Isolation Panel [NHANES]    Frequency of Communication with Friends and Family: More than three times a week    Frequency of Social Gatherings with Friends and Family: More than three times a week    Attends Religious Services: More than 4 times per year    Active Member of [employer] or Organizations: Yes    Attends Engineer, structural: More than 4 times per year    Marital Status: Married  Catering manager Violence: Not At Risk (12/25/2022)   Humiliation, Afraid, Rape, and Kick questionnaire    Fear of Current or Ex-Partner: No    Emotionally Abused: No    Physically Abused: No    Sexually Abused: No        Objective    BP 118/73   Pulse 93   Ht 5' 8.4" (1.737 m)   Wt 220 lb (99.8 kg)   BMI 33.06 kg/m   Physical Exam  Gen: Well-appearing Eyes: Normal Ears: Moderate cerumen, not impacted, tympanic membranes appear normal Neck: Normal thyroid , no nodules or adenopathy Heart: Regular, no murmur Lungs: Unlabored, clear throughout with no crackles Abd: Soft, nontender, no organomegaly Ext: Warm, trace nonpitting edema in both legs, left a little worse than the right, knee replacement surgery scars are well-healed Neuro: Alert, conversational, moderately delayed get up and go, unsteadiness on getting to the table, full strength upper and lower extremities, wide-based gait, no assistive device Psych: Appropriate mood and affect, not anxious or depressed appearing, very pleasant to talk with  EKG: Obtained due to new fatigue in a person with a history of ischemic heart disease EKG is normal sinus rhythm, normal axis, right bundle branch block, nonspecific ST changes in V3-V5, these are unchanged when compared to EKG from March 2024, no new Q waves or deep T wave inversions.  Assessment & Plan:   Problem List Items Addressed This Visit       High   Diabetes mellitus without complication (HCC)  (Chronic)   Chronic and stable.  Managed with Dr. Kathyanne Parkers in endocrinology.  He reports last A1c was 7.1% very recently.  He uses insulin  through a OmniPod and a Dexcom CGM.  Uses metformin  with B12 supplementation.  Uses Ozempic over the last 2 years.  Has recently lost about 15 pounds over the last 6 months, which seems consistent with the effect of Ozempic.  Diabetes is complicated by ischemic vascular disease, mostly coronary artery disease.      Relevant Orders   Comprehensive metabolic panel with GFR   CAD (coronary artery disease) (Chronic)   Chronic issue.  He had an acute myocardial infarction in 2012 which was treated with PCI to the mid LAD.  He is having symptoms of generalized fatigue and declining exertional abilities over the last 4 months.  No angina, but cannot rule out cardiac ischemia because of underlying diabetes.  EKG today reassuring.  If blood work does not reveal another culprit for the generalized fatigue, I am going to ask him to see his cardiologist and consider a CT angiography.  Patient's last ischemic evaluation with a nuclear medicine stress test was in 2015, and he reports not tolerating that procedure well.  For now we will continue medical management with aspirin , simvastatin , metoprolol , and lisinopril .      Relevant Medications   tadalafil  (CIALIS ) 20 MG tablet   Other Relevant Orders   EKG 12-Lead   Lipid panel     Medium    Essential hypertension (Chronic)   Blood pressure well-controlled today.  He is having increasing fatigue, but orthostatic blood pressure today was normal.  Will plan to continue with amlodipine  and lisinopril .      Relevant Medications   tadalafil  (CIALIS ) 20 MG tablet   Other Relevant Orders   Comprehensive metabolic panel with GFR     Unprioritized   Fatigue - Primary   New issue over the last 4 months.  Unclear etiology at this point.  Associated with unintentional weight loss of about 15 pounds.  Differential could include new  anemia, renal dysfunction, iron deficiency, thyroid  dysfunction amongst many other things.  No issues with mood.  Exam today is normal, I do not think this is an issue of shortness of breath.  Will check labs today.  Like it checked today is the TSH because he has recently been using biotin.  I have to discuss using this medication and I can check the TSH in about 4 weeks.  If we do not find the culprits on this blood work, we will refer him for cardiac stress testing/CTA with his cardiologist given his history of ischemic CAD.      Relevant Orders   CBC   Comprehensive metabolic panel with GFR   Vitamin B12   IFE, PE and FLC, Serum   Iron deficiency   Increasing fatigue and declining exertional status over the last 4 months.  Patient with a history of colon polyps.  I suspect he may have iron deficiency.  Will check CBC and iron levels today.      Relevant Orders   CBC   IBC + Ferritin   Lower urinary tract symptoms (LUTS)    Return in about 4 weeks (around 09/16/2023).   Ether Hercules, MD

## 2023-08-20 ENCOUNTER — Ambulatory Visit: Payer: Self-pay | Admitting: Student in an Organized Health Care Education/Training Program

## 2023-08-22 ENCOUNTER — Emergency Department (HOSPITAL_COMMUNITY)

## 2023-08-22 ENCOUNTER — Emergency Department (HOSPITAL_COMMUNITY)
Admission: EM | Admit: 2023-08-22 | Discharge: 2023-08-22 | Disposition: A | Discharge status: Home / Self Care | Attending: Emergency Medicine | Admitting: Emergency Medicine

## 2023-08-22 ENCOUNTER — Other Ambulatory Visit: Payer: Self-pay

## 2023-08-22 DIAGNOSIS — J45909 Unspecified asthma, uncomplicated: Secondary | ICD-10-CM | POA: Insufficient documentation

## 2023-08-22 DIAGNOSIS — I110 Hypertensive heart disease with heart failure: Secondary | ICD-10-CM | POA: Diagnosis not present

## 2023-08-22 DIAGNOSIS — R5383 Other fatigue: Secondary | ICD-10-CM | POA: Diagnosis not present

## 2023-08-22 DIAGNOSIS — I509 Heart failure, unspecified: Secondary | ICD-10-CM | POA: Insufficient documentation

## 2023-08-22 DIAGNOSIS — R5382 Chronic fatigue, unspecified: Secondary | ICD-10-CM | POA: Insufficient documentation

## 2023-08-22 DIAGNOSIS — Z794 Long term (current) use of insulin: Secondary | ICD-10-CM | POA: Insufficient documentation

## 2023-08-22 DIAGNOSIS — R918 Other nonspecific abnormal finding of lung field: Secondary | ICD-10-CM | POA: Diagnosis not present

## 2023-08-22 DIAGNOSIS — E119 Type 2 diabetes mellitus without complications: Secondary | ICD-10-CM | POA: Insufficient documentation

## 2023-08-22 DIAGNOSIS — Z7984 Long term (current) use of oral hypoglycemic drugs: Secondary | ICD-10-CM | POA: Insufficient documentation

## 2023-08-22 DIAGNOSIS — H8110 Benign paroxysmal vertigo, unspecified ear: Secondary | ICD-10-CM | POA: Insufficient documentation

## 2023-08-22 DIAGNOSIS — Z79899 Other long term (current) drug therapy: Secondary | ICD-10-CM | POA: Insufficient documentation

## 2023-08-22 DIAGNOSIS — I2510 Atherosclerotic heart disease of native coronary artery without angina pectoris: Secondary | ICD-10-CM | POA: Insufficient documentation

## 2023-08-22 DIAGNOSIS — Z7982 Long term (current) use of aspirin: Secondary | ICD-10-CM | POA: Diagnosis not present

## 2023-08-22 DIAGNOSIS — R42 Dizziness and giddiness: Secondary | ICD-10-CM | POA: Insufficient documentation

## 2023-08-22 LAB — COMPREHENSIVE METABOLIC PANEL WITH GFR
ALT: 19 U/L (ref 0–44)
AST: 21 U/L (ref 15–41)
Albumin: 3.6 g/dL (ref 3.5–5.0)
Alkaline Phosphatase: 48 U/L (ref 38–126)
Anion gap: 9 (ref 5–15)
BUN: 9 mg/dL (ref 8–23)
CO2: 30 mmol/L (ref 22–32)
Calcium: 9.6 mg/dL (ref 8.9–10.3)
Chloride: 102 mmol/L (ref 98–111)
Creatinine, Ser: 0.74 mg/dL (ref 0.61–1.24)
GFR, Estimated: >60 mL/min (ref >60)
Glucose, Bld: 149 mg/dL — ABNORMAL HIGH (ref 70–99)
Potassium: 4.2 mmol/L (ref 3.5–5.1)
Sodium: 141 mmol/L (ref 135–145)
Total Bilirubin: 0.7 mg/dL (ref 0.0–1.2)
Total Protein: 6.9 g/dL (ref 6.5–8.1)

## 2023-08-22 LAB — CBC WITH DIFFERENTIAL/PLATELET
Abs Immature Granulocytes: 0 10*3/uL (ref 0.00–0.07)
Basophils Absolute: 0.1 10*3/uL (ref 0.0–0.1)
Basophils Relative: 1 %
Eosinophils Absolute: 0.1 10*3/uL (ref 0.0–0.5)
Eosinophils Relative: 2 %
HCT: 42.3 % (ref 39.0–52.0)
Hemoglobin: 14.2 g/dL (ref 13.0–17.0)
Immature Granulocytes: 0 %
Lymphocytes Relative: 34 %
Lymphs Abs: 1.7 10*3/uL (ref 0.7–4.0)
MCH: 27.9 pg (ref 26.0–34.0)
MCHC: 33.6 g/dL (ref 30.0–36.0)
MCV: 83.1 fL (ref 80.0–100.0)
Monocytes Absolute: 0.5 10*3/uL (ref 0.1–1.0)
Monocytes Relative: 10 %
Neutro Abs: 2.7 10*3/uL (ref 1.7–7.7)
Neutrophils Relative %: 53 %
Platelets: 206 10*3/uL (ref 150–400)
RBC: 5.09 MIL/uL (ref 4.22–5.81)
RDW: 13.6 % (ref 11.5–15.5)
WBC: 5.1 10*3/uL (ref 4.0–10.5)
nRBC: 0 % (ref 0.0–0.2)

## 2023-08-22 LAB — URINALYSIS, ROUTINE W REFLEX MICROSCOPIC
Bilirubin Urine: NEGATIVE
Glucose, UA: 50 mg/dL — AB
Hgb urine dipstick: NEGATIVE
Ketones, ur: NEGATIVE mg/dL
Nitrite: NEGATIVE
Protein, ur: 30 mg/dL — AB
Specific Gravity, Urine: 1.02 (ref 1.005–1.030)
pH: 5 (ref 5.0–8.0)

## 2023-08-22 LAB — BRAIN NATRIURETIC PEPTIDE: B Natriuretic Peptide: 32.1 pg/mL (ref 0.0–100.0)

## 2023-08-22 LAB — TROPONIN I (HIGH SENSITIVITY)
Troponin I (High Sensitivity): 3 ng/L (ref <18)
Troponin I (High Sensitivity): 3 ng/L (ref <18)

## 2023-08-22 LAB — TSH: TSH: 0.743 u[IU]/mL (ref 0.350–4.500)

## 2023-08-22 LAB — MAGNESIUM: Magnesium: 1.8 mg/dL (ref 1.7–2.4)

## 2023-08-22 MED ORDER — MECLIZINE HCL 25 MG PO TABS: 25.0000 mg | ORAL_TABLET | Freq: Three times a day (TID) | ORAL | 0 refills | Status: DC | PRN

## 2023-08-22 MED ORDER — ALBUTEROL SULFATE HFA 108 (90 BASE) MCG/ACT IN AERS: 1.0000 | INHALATION_SPRAY | Freq: Four times a day (QID) | RESPIRATORY_TRACT | 0 refills | Status: DC | PRN

## 2023-08-22 MED ORDER — PREDNISONE 10 MG (21) PO TBPK: ORAL_TABLET | Freq: Every day | ORAL | 0 refills | Status: DC

## 2023-08-22 MED ORDER — MECLIZINE HCL 25 MG PO TABS
25.0000 mg | ORAL_TABLET | Freq: Once | ORAL | Status: AC
  Administered 2023-08-22: 25 mg via ORAL
  Filled 2023-08-22: qty 1

## 2023-08-22 NOTE — ED Provider Triage Note (Signed)
 Emergency Medicine Provider Triage Evaluation Note  Joel Williams , a 73 y.o. male  was evaluated in triage.  Pt complains of fatigue for the past month or so.  He reports that ever he stands up he does have a lightheadedness sensation in feels a little unsteady however this improves "once he gets his bearings".  Denies any room spinning sensation.  Denies any chest pain or shortness of breath.  Denies any unilateral weakness.  He does have tremors however this is not new for him.  Reports he has occasional headaches but nothing out of the normal for him.  Was recently seen by his PCP and told his lab work was unremarkable and they would follow-up with him in earlier next month.  He does not seem to be getting any better and to the emergency department.  No significant new or acute worsening of symptoms.  Review of Systems  Positive:  Negative:   Physical Exam  BP (!) 161/92   Pulse 96   Temp 98.7 F (37.1 C)   Resp 18   Ht 5\' 8"  (1.727 m)   Wt 99.8 kg   SpO2 93%   BMI 33.45 kg/m  Gen:   Awake, no distress   Resp:  Normal effort  MSK:   Moves extremities without difficulty  Other:  Cranial nerves grossly intact.  Strength and sensation grossly intact in upper and lower extremities.  No pronator drift.  Answering questions appropriately appropriate speech.  Medical Decision Making  Medically screening exam initiated at 11:15 AM.  Appropriate orders placed.  Joel Williams was informed that the remainder of the evaluation will be completed by another provider, this initial triage assessment does not replace that evaluation, and the importance of remaining in the ED until their evaluation is complete.  Labs and chest x-ray ordered.   Spence Dux, [place] 08/22/23 1914

## 2023-08-22 NOTE — Discharge Instructions (Addendum)
 Return to Emergency Department if you experience numbness or weakness in one-sided body, chest pain, shortness of breath, dizziness while at that does not worsen with position

## 2023-08-22 NOTE — ED Triage Notes (Addendum)
 Pt has been suffering from fatique, loss of appetite, 10lb weight loss for the last couple of weeks. Pt was seen at PCP and blood work was unremarkable. Since pcp appt fatigue has gotten worse. Pt describes it as not having energy. Pt had a bug bit to left hand on may 8th and has had fatigue since. No cp sob. Pt has been off balance for 1 week as well. No other neuro symptoms

## 2023-08-23 ENCOUNTER — Ambulatory Visit: Payer: Self-pay

## 2023-08-23 ENCOUNTER — Encounter: Payer: Self-pay | Admitting: Student in an Organized Health Care Education/Training Program

## 2023-08-23 ENCOUNTER — Ambulatory Visit: Admitting: Student in an Organized Health Care Education/Training Program

## 2023-08-23 VITALS — BP 117/74 | HR 102 | Wt 220.0 lb

## 2023-08-23 DIAGNOSIS — R519 Headache, unspecified: Secondary | ICD-10-CM | POA: Insufficient documentation

## 2023-08-23 DIAGNOSIS — R634 Abnormal weight loss: Secondary | ICD-10-CM

## 2023-08-23 LAB — IFE, PE AND FLC, SERUM
Albumin SerPl Elph-Mcnc: 3.8 g/dL (ref 2.9–4.4)
Albumin/Glob SerPl: 1.3 (ref 0.7–1.7)
Alpha 1: 0.2 g/dL (ref 0.0–0.4)
Alpha2 Glob SerPl Elph-Mcnc: 0.9 g/dL (ref 0.4–1.0)
B-Globulin SerPl Elph-Mcnc: 1.4 g/dL — ABNORMAL HIGH (ref 0.7–1.3)
Gamma Glob SerPl Elph-Mcnc: 0.4 g/dL (ref 0.4–1.8)
Globulin, Total: 3 g/dL (ref 2.2–3.9)
Ig Kappa Free Light Chain: 24.7 mg/L — ABNORMAL HIGH (ref 3.3–19.4)
Ig Lambda Free Light Chain: 17.4 mg/L (ref 5.7–26.3)
IgA/Immunoglobulin A, Serum: 571 mg/dL — ABNORMAL HIGH (ref 61–437)
IgG (Immunoglobin G), Serum: 632 mg/dL (ref 603–1613)
IgM (Immunoglobulin M), Srm: 27 mg/dL (ref 15–143)
KAPPA/LAMBDA RATIO: 1.42 (ref 0.26–1.65)
Total Protein: 6.8 g/dL (ref 6.0–8.5)

## 2023-08-23 MED ORDER — LORAZEPAM 1 MG PO TABS
1.0000 mg | ORAL_TABLET | Freq: Once | ORAL | 0 refills | Status: AC
Start: 2023-08-23 — End: 2023-08-23

## 2023-08-23 NOTE — Telephone Encounter (Signed)
 FYI

## 2023-08-23 NOTE — Assessment & Plan Note (Signed)
 16 pounds of weight loss loss over the last 6 months which has been unintentional.  Associated with profound fatigue and declining functional status.  He has dysgeusia, but no other real localizing symptoms to find a culprit.  I did recommend he stop semaglutide.  Our initial blood work was all reassuring without an explanation.  Exam is limited by obesity.  Will get CT of the abdomen to rule out structural disease or malignancy of the next mentation.  He had a chest x-ray in the emergency department which was reassuring.

## 2023-08-23 NOTE — Progress Notes (Signed)
 Established Patient Office Visit  Subjective   Patient ID: Joel Williams, male    DOB: May 11, 1950  Age: 73 y.o. MRN: [id number]  Chief Complaint  Patient presents with   Fatigue    Patient was seen in the ER and did have labs, 2 EKG. Not showing any improvement. Patient states fatigue and no appetite. Patient states some foods have not tasted very well for a while and states food taste bland. Did receive prednisone and meclizine at the ER. Patient and family states he is more off balance. Stung in may on left index finger patient states it was like an electric sting. Has been feeling fatigue and no appetite for a couple of months now. Seems more confused these past couple of days    HPI  73 year old person here for evaluation of declining functional status, worsening fatigue.  This has been going on for many months but is rapidly worsening over the last few weeks.  I saw him in the office last week and we did some initial blood work which did not show an explanation.  Over the weekend he had some worsening symptoms, trouble even getting out of bed on Sunday.  So he went to the emergency department.  Their evaluation was similar, initial blood work, reassuring exam.  He is accompanied today by his wife and daughter were very worried about his declining functional status.  Yesterday he only ate 1 meal.  He has had 16 pounds of unintentional weight loss over the last 6 months.  Reports food not tasting well.  Now he is reporting some being unbalanced, having difficulty walking, feels like he is falling to the right side when he walks.  He has chronic tremor that he says he has had all of his life.  The imbalance that was very new for him.  He reports headache which has been a problem for him since his cataract surgery about a year ago.  Headache is worsening.  Sometimes wakes up with a sensation of headache on the right side of his face.    Objective:     BP 117/74   Pulse (!) 102   Wt 220  lb (99.8 kg)   BMI 33.45 kg/m    Physical Exam  Gen: Well-appearing man Neuro: Alert, conversational, speech is mildly slow, he has sarcopenia in both lower extremities equally, full strength in the upper and lower extremities, mildly diminished reflexes throughout, essential tremor present at rest and worsens with intention, he has a wide-based gait that is unstable without assist device Neck: No lymphadenopathy Heart: Regular, no murmur Lungs: Unlabored, clear throughout Abd: Soft, obese, nontender, no organomegaly Ext: Warm, no edema, normal joints Skin: No rashes Psych: Appropriate mood and affect, calm, not anxious or depressed appearing.    Assessment & Plan:   Problem List Items Addressed This Visit       Unprioritized   Unintentional weight loss - Primary   16 pounds of weight loss loss over the last 6 months which has been unintentional.  Associated with profound fatigue and declining functional status.  He has dysgeusia, but no other real localizing symptoms to find a culprit.  I did recommend he stop semaglutide.  Our initial blood work was all reassuring without an explanation.  Exam is limited by obesity.  Will get CT of the abdomen to rule out structural disease or malignancy of the next mentation.  He had a chest x-ray in the emergency department which was reassuring.  Relevant Orders   CT ABDOMEN PELVIS W CONTRAST   Headache   Chronic issue for the last 1 year but seems to be increasing in frequency and severity.  It is associated with worsening fatigue, declining functional status.  Now reports gait ataxia and imbalance.  There are some high risk features to the headache including nocturnal symptoms, and sometimes he wakes up with headache.  Given the neurologic change along with the high risk features of the headache, we will get an MRI brain to rule out structural lesion or CNS malignancy.      Relevant Orders   MR Brain Wo Contrast    Return in about 2  weeks (around 09/06/2023).    Ether Hercules, MD

## 2023-08-23 NOTE — ED Provider Notes (Signed)
 [place] EMERGENCY DEPARTMENT AT [facility] Provider Note   CSN: [id number] Arrival date & time: 08/22/23  1059     History  No chief complaint on file.   Joel Williams is a 72 y.o. male with past medical history of DM, HLD, HTN, asthma, CAD, HF, IDA presents to emergency department for multiple complaints.    He complains of fatigue that has been present over the past "several months to years".  Denies infectious signs over the past several months to years.  Recently, he has had a productive cough for the past "couple of months".  He also complains of loss of appetite and 10 pound weight loss over the past 3 months.  No abdominal pain.  Has been on [facility] for years with no recent dose adjustment.  No abdominal pain, nausea, vomiting, diarrhea.  He also reports that he had a possible bite  to his left middle finger on May 8 while doing yard work.  Did not note any insects, ticks to area.  Wife immediately checked his wound and did not notice any puncture wounds.  Has not had a fever nor erythema to area.  Has been able to range finger without difficulty.  He also complains of dizziness that has been for the past "few months" but has been worsening over the past week.  Reports that he has been treated for vertigo in the past and is actually gone to PT for specific maneuvers to help with vertigo.  Unfortunately, he has stopped this 3 months prior and vertigo came back.  Does not have any medicine for vertigo symptoms denies paresthesia, numbness, weakness, loss of vision.   HPI     Home Medications Prior to Admission medications   Medication Sig Start Date End Date Taking? Authorizing Provider  albuterol  (VENTOLIN  HFA) 108 (90 Base) MCG/ACT inhaler Inhale 1-2 puffs into the lungs every 6 (six) hours as needed for wheezing or shortness of breath. 08/22/23  Yes Royann Cords, PA  meclizine (ANTIVERT) 25 MG tablet Take 1 tablet (25 mg total) by mouth 3 (three) times daily  as needed for dizziness. 08/22/23  Yes Royann Cords, PA  predniSONE (STERAPRED UNI-PAK 21 TAB) 10 MG (21) TBPK tablet Take by mouth daily. Take 6 tabs by mouth daily  for 2 days, then 5 tabs for 2 days, then 4 tabs for 2 days, then 3 tabs for 2 days, 2 tabs for 2 days, then 1 tab by mouth daily for 2 days 08/22/23  Yes Royann Cords, PA  albuterol  (VENTOLIN  HFA) 108 (90 Base) MCG/ACT inhaler Inhale 2 puffs into the lungs every 4 (four) hours. 03/30/23   [provider]  amLODipine  (NORVASC ) 10 MG tablet Take 1 tablet (10 mg total) by mouth daily. 06/10/22   Flo Hummingbird, PA-C  aspirin  81 MG tablet Take 1 tablet (81 mg total) by mouth daily. 09/03/10   Odie Benne, MD  B-D ULTRAFINE III SHORT PEN 31G X 8 MM MISC Inject 1 Syringe into the skin daily. 01/21/21   [provider]  Cholecalciferol (VITAMIN D) 50 MCG (2000 UT) tablet Take 2,000 Units by mouth daily.    [provider]  Continuous Glucose Sensor (FREESTYLE LIBRE 3 PLUS SENSOR) MISC apply to upper back of arm for 90 days change sensor every 15 days IC-10 CODE: E11.9    [provider]  Cyanocobalamin (VITAMIN B12) 1000 MCG TBCR 2 tablets Orally Once a day    [provider]  glucagon 1 MG injection Inject 1 mg into the vein once as needed (Low blood sugar).     [provider]  Insulin  Disposable Pump (OMNIPOD DASH PODS, GEN 4,) MISC Inject into the skin. 02/04/21   [provider]  insulin  glargine (LANTUS SOLOSTAR) 100 UNIT/ML Solostar Pen Inject 80 Units into the skin daily.  AM    [provider]  Insulin  Human (INSULIN  PUMP) 100 unit/ml SOLN Inject into the skin continuous. Humalog; basal rate: 85-100 units per day.    [provider]  ketorolac (ACULAR) 0.5 % ophthalmic solution Place 1 drop into the right eye 4 (four) times daily. 05/28/22   [provider]  lisinopril  (ZESTRIL ) 40 MG tablet Take 1 tablet (40 mg total) by mouth daily.  06/10/22   Flo Hummingbird, PA-C  MAGNESIUM PO Take 1 tablet by mouth daily.    [provider]  metFORMIN  (GLUCOPHAGE -XR) 500 MG 24 hr tablet Take 1,000 mg by mouth 2 (two) times daily.    [provider]  metoprolol  succinate (TOPROL -XL) 50 MG 24 hr tablet Take with or immediately following a meal. 06/10/22   Flo Hummingbird, PA-C  moxifloxacin (VIGAMOX) 0.5 % ophthalmic solution Place 1 drop into the right eye 4 (four) times daily. 05/28/22   [provider]  naproxen sodium (ALEVE) 220 MG tablet Take 440 mg by mouth 2 (two) times daily as needed (headaches).    [provider]  nitroGLYCERIN  (NITROSTAT ) 0.4 MG SL tablet Place 0.4 mg under the tongue every 5 (five) minutes as needed for chest pain.     [provider]  NOVOLIN R RELION 100 UNIT/ML injection USE AS DIRECTED VIA INSULIN  PUMP 02/21/18   [provider]  Potassium 99 MG TABS Take 99 mg by mouth every evening.    [provider]  prednisoLONE acetate (PRED FORTE) 1 % ophthalmic suspension Place 1 drop into the right eye 4 (four) times daily. 05/28/22   [provider]  Pyridoxine HCl (VITAMIN B6 PO) Take 500 mg by mouth daily.    [provider]  Semaglutide, 1 MG/DOSE, (OZEMPIC, 1 MG/DOSE,) 2 MG/1.5ML SOPN Inject 1 mg into the skin every Monday.    [provider]  simvastatin  (ZOCOR ) 20 MG tablet Take 1 tablet (20 mg total) by mouth daily. 06/10/22   Flo Hummingbird, PA-C  tadalafil  (CIALIS ) 20 MG tablet Take 20 mg by mouth daily as needed for erectile dysfunction.    [provider]  vitamin E 400 UNIT capsule Take 400 Units by mouth every evening.    [provider]      Allergies    Dulaglutide and Invokana [canagliflozin]    Review of Systems   Review of Systems  Constitutional:  Negative for chills, fatigue and fever.  Respiratory:  Negative for cough, chest tightness, shortness of breath and wheezing.    Cardiovascular:  Negative for chest pain and palpitations.  Gastrointestinal:  Negative for abdominal pain, constipation, diarrhea, nausea and vomiting.  Neurological:  Negative for dizziness, seizures, weakness, light-headedness, numbness and headaches.    Physical Exam Updated Vital Signs BP (!) 154/87 (BP Location: Right Arm)   Pulse 88   Temp (!) 97.5 F (36.4 C) (Oral)   Resp 16   Ht 5\' 8"  (1.727 m)   Wt 99.8 kg   SpO2 94%   BMI 33.45 kg/m  Physical Exam Vitals and nursing note reviewed.  Constitutional:  General: He is not in acute distress.    Appearance: Normal appearance. He is not ill-appearing.  HENT:     Head: Normocephalic and atraumatic.     Mouth/Throat:     Mouth: Mucous membranes are moist.  Eyes:     Extraocular Movements: Extraocular movements intact.     Right eye: Normal extraocular motion and no nystagmus.     Left eye: Normal extraocular motion and no nystagmus.     Conjunctiva/sclera: Conjunctivae normal.     Pupils: Pupils are equal, round, and reactive to light.  Neck:     Comments: No meningismus Cardiovascular:     Rate and Rhythm: Normal rate.     Pulses: Normal pulses.  Pulmonary:     Effort: Pulmonary effort is normal. No respiratory distress.     Breath sounds: Normal breath sounds.     Comments: Speaking full complete sentences without difficulty.  No signs of respiratory distress.  Maintaining oxygen saturation without supplementation.  Lung sounds CTAB Chest:     Chest wall: No tenderness.  Abdominal:     General: Bowel sounds are normal. There is no distension.     Palpations: Abdomen is soft.     Tenderness: There is no abdominal tenderness. There is no guarding.     Comments: Nonsurgical abdomen with no peritoneal signs.  Musculoskeletal:     Cervical back: Normal range of motion and neck supple. No rigidity or tenderness.     Right lower leg: No edema.     Left lower leg: No edema.     Comments: Celine Collard' sign negative x 2   Skin:    Coloration: Skin is not jaundiced or pale.  Neurological:     Mental Status: He is alert and oriented to person, place, and time. Mental status is at baseline.     Cranial Nerves: No cranial nerve deficit.     Sensory: No sensory deficit.     Motor: No weakness.     Coordination: Coordination normal.     Comments: Following commands appropriately.  Does have to take extra time to change positions as he endorses dizziness with changes of positions.  No dizziness at rest.  Grip strength equal bilaterally     ED Results / Procedures / Treatments   Labs (all labs ordered are listed, but only abnormal results are displayed) Labs Reviewed  COMPREHENSIVE METABOLIC PANEL WITH GFR - Abnormal; Notable for the following components:      Result Value   Glucose, Bld 149 (*)    All other components within normal limits  URINALYSIS, ROUTINE W REFLEX MICROSCOPIC - Abnormal; Notable for the following components:   Color, Urine AMBER (*)    APPearance HAZY (*)    Glucose, UA 50 (*)    Protein, ur 30 (*)    Leukocytes,Ua TRACE (*)    Bacteria, UA RARE (*)    All other components within normal limits  CBC WITH DIFFERENTIAL/PLATELET  TSH  MAGNESIUM  BRAIN NATRIURETIC PEPTIDE  TROPONIN I (HIGH SENSITIVITY)  TROPONIN I (HIGH SENSITIVITY)    EKG None  Radiology DG Chest 2 View Result Date: 08/22/2023 CLINICAL DATA:  Fatigue. EXAM: CHEST - 2 VIEW COMPARISON:  08/20/2017 FINDINGS: Heart size and mediastinal contours are normal. Central airway thickening is identified, unchanged from previous exam. No pleural fluid, interstitial edema, or airspace disease. The visualized osseous structures are notable for mild thoracic degenerative disc disease. IMPRESSION: Central airway thickening compatible with bronchitis or reactive airways disease. Electronically Signed  By: Kimberley Penman M.D.   On: 08/22/2023 12:27    Procedures Procedures    Medications Ordered in ED Medications  meclizine  (ANTIVERT) tablet 25 mg (25 mg Oral Given 08/22/23 1728)    ED Course/ Medical Decision Making/ A&P                                 Medical Decision Making Risk Prescription drug management.     Patient presents to the ED for concern of fatigue, dizziness, weight loss, this involves an extensive number of treatment options, and is a complaint that carries with it a high risk of complications and morbidity.  The differential diagnosis includes ACS, electrolyte abnormality, infection, CVA/TIA, intra-abdominal pathology, hypoglycemia, DKA/HHS.  Not exhaustive list   Co morbidities that complicate the patient evaluation  See HPI   Additional history obtained:  Additional history obtained from [facility]   External records from outside source obtained and reviewed including triage note, wife at bedside   Lab Tests:  I Ordered, and personally interpreted labs.  The pertinent results include:   CBG 149 TSH 0.743 Troponin negative x 2 UA negative for infection   Imaging Studies ordered:  I ordered imaging studies including chest x-ray I independently visualized and interpreted imaging which showed bronchitis I agree with the radiologist interpretation   Cardiac Monitoring:  The patient was maintained on a cardiac monitor.    Medicines ordered and prescription drug management:  I ordered medication including prednisone, inhaler, meclizine for bronchitis, vertigo I have reviewed the patients home medicines and have made adjustments as needed     Problem List / ED Course:  Chronic fatigue Has been going on for "several months to years".  And has not changed acutely Reports that he does snore so am interested in patient obtaining sleep study in future to ensure no OSA.  This can be done outpatient TSH normal.  Troponin negative x 2.  UA without infection.  CBG 149.  No leukocytosis.  No anemia.  No transaminitis.  Kidney function normal.  No anion  gap. Endorses a 10 pound weight loss over 3 months.  This is likely secondary to decreased appetite.  Fortunately, he has no surgical abdomen nor signs of acute intra-abdominal pathology.  He does have GI and can follow-up with them regarding this and his Wegovy dose   Bronchitis Chest x-ray is positive for bronchitis.  Will provide prednisone, albuterol  inhaler to use as needed for wheezing, shortness of breath No signs of pneumonia.  No shortness of breath. Patient has had prednisone in the past for bronchitis specifically without significant complication.  I emphasized the importance of monitoring CBG as steroids can increase sugars  Positional vertigo Has been going on chronically as well.  Was seen at PT for this and I think this would benefit him to follow-up with them and/or ENT per PCP recommendation I did provide meclizine in ED for symptoms as well as prescription to see if this helps with the symptoms He is neurologically intact with no dizziness at rest.  Low suspicion for posterior CVA/TIA Will have patient follow-up with PCP regarding this   Reevaluation:  After the interventions noted above, I reevaluated the patient and found that they have :stayed the same   Social Determinants of Health:  Has PCP   Dispostion:  After consideration of the diagnostic results and the patients response to treatment, I feel that  the patent would benefit from outpatient management for chronic symptoms.  I do not feel that patient requires any further emergent workup at this time due to chronicity of symptoms.  Do not see any red flags for acute emergent cause of symptoms.  Workup reassuring.  Patient has very good follow-up with PCP and various other specialties  Discussed ED workup, disposition, return to ED precautions with patient who expresses understanding agrees with plan.  All questions answered to their satisfaction.  They are agreeable to plan.  Discharge instructions provided on  paperwork  Final Clinical Impression(s) / ED Diagnoses Final diagnoses:  Chronic fatigue  Benign paroxysmal positional vertigo, unspecified laterality    Rx / DC Orders ED Discharge Orders          Ordered    predniSONE (STERAPRED UNI-PAK 21 TAB) 10 MG (21) TBPK tablet  Daily        08/22/23 1723    albuterol  (VENTOLIN  HFA) 108 (90 Base) MCG/ACT inhaler  Every 6 hours PRN        08/22/23 1723    meclizine (ANTIVERT) 25 MG tablet  3 times daily PRN        08/22/23 1723              Royann Cords, PA 08/23/23 0134    Flonnie Humphrey, DO 08/23/23 [phone]

## 2023-08-23 NOTE — Telephone Encounter (Signed)
 Additional Information . Commented on: Answer Assessment    Called CAL at [facility] to request if Dr Darren Em would accept patient back as patient's wife has requested. Per Daivd Dub, "Dr Darren Em is not accepting any new patients".  Protocols used: Weakness (Generalized) and Fatigue-A-AH

## 2023-08-23 NOTE — Telephone Encounter (Addendum)
 FYI Only or Action Required?: FYI only for provider  Patient was last seen in primary care on 08/19/2023 by Ether Hercules, MD. Called Nurse Triage reporting Fatigue. Symptoms began several months ago. Interventions attempted: Rest, hydration, or home remedies. Symptoms are: dizziness, fatigue, low appetite rapidly worsening.  Triage Disposition: See Physician Within 24 Hours  Patient/caregiver understands and will follow disposition?: Yes         Copied from CRM [id number]. Topic: Clinical - Red Word Triage >> Aug 23, 2023  8:13 AM Elle L wrote: Red Word that prompted transfer to Nurse Triage: The patient's wife states that the patient was in the Emergency Room yesterday. All tests came back normal. However, the patient is still experiencing extreme weakness and extreme fatigue. She is unable to get him out of bed to take the medications that the Hospital prescribed to him for dizziness. He has had a heart attack in the past and has a Immunologist. He has lost a lot of weight, has a low appetite, and he is type 2 diabetic but his sugar has been good. Reason for Disposition  [1] MODERATE weakness (i.e., interferes with work, school, normal activities) AND [2] persists > 3 days  Answer Assessment - Initial Assessment Questions 1. DESCRIPTION: "Describe how you are feeling."     Extremely tired. Wife states she can't get him out of bed this morning because he states "I'm too tired". Dizziness and vertigo.  2. SEVERITY: "How bad is it?"  "Can you stand and walk?"   - MILD (0-3): Feels weak or tired, but does not interfere with work, school or normal activities.   - MODERATE (4-7): Able to stand and walk; weakness interferes with work, school, or normal activities.   - SEVERE (8-10): Unable to stand or walk; unable to do usual activities.     Moderate. Wife states patient is able to walk on his own but is unsteady and cautious.  3. ONSET: "When did these symptoms begin?" (e.g., hours, days,  weeks, months)     Wife reports intermittent X 1 month, per ED note months x 1 year. Rapidly worsened over the past week.   4. CAUSE: "What do you think is causing the weakness or fatigue?" (e.g., not drinking enough fluids, medical problem, trouble sleeping)     Unsure, wife states the ED was not able to give them any answers.  5. NEW MEDICINES:  "Have you started on any new medicines recently?" (e.g., opioid pain medicines, benzodiazepines, muscle relaxants, antidepressants, antihistamines, neuroleptics, beta blockers)     Patient has not started any of the medications ED sent him home on.  6. OTHER SYMPTOMS: "Do you have any other symptoms?" (e.g., chest pain, fever, cough, SOB, vomiting, diarrhea, bleeding, other areas of pain)     Wife denies: chest pain, SOB, fever, cough, nausea, vomiting, diarrhea. She states he has had weight loss and poor appetite.  7. PREGNANCY: "Is there any chance you are pregnant?" "When was your last menstrual period?"     N/A.  Patient seen in the ED yesterday.Attempted to schedule patient for hospital follow up with Dr Gayl Katos, wife is upset and states they had been patient of Dr Darren Em at [facility] for years and would like to see him again. Wife is requesting patient's HFU be with Dr. Darren Em. Called CAL at [facility] and confirmed patient will have to schedule his HFU with Dr. Gayl Katos and then can call CAL for [facility] and ask if Dr Darren Em will  accept patient back for a TOC appt.  Protocols used: Weakness (Generalized) and Fatigue-A-AH

## 2023-08-23 NOTE — Assessment & Plan Note (Signed)
 Chronic issue for the last 1 year but seems to be increasing in frequency and severity.  It is associated with worsening fatigue, declining functional status.  Now reports gait ataxia and imbalance.  There are some high risk features to the headache including nocturnal symptoms, and sometimes he wakes up with headache.  Given the neurologic change along with the high risk features of the headache, we will get an MRI brain to rule out structural lesion or CNS malignancy.

## 2023-08-24 NOTE — Telephone Encounter (Signed)
-----   Message from Antoinette Batman sent at 08/20/2023  9:58 AM EDT ----- Can we get him in to see an APP or me? Thanks, chris ----- Message ----- From: Ether Hercules, MD Sent: 08/20/2023   7:58 AM EDT To: Odie Benne, MD  Patient with ischemic heart disease came to my office yesterday with increasing exertional fatigue and declining functional status.  Exam was reassuring.  Lab work also reassuring.  I do not see a culprit of his exertional fatigue.  No chest pain.  Given his diabetes I wonder about development of flow-limiting ischemic heart disease.  I asked him to follow-up with his cardiologist, Dr. Abel Hoe, and consider an ischemic evaluation.  It has been about 10 years since his last evaluation, CT angiography may be helpful.

## 2023-08-24 NOTE — Telephone Encounter (Signed)
 Scheduled w Dr. Abel Hoe 08/26/23.  Pt aware and appreciative for the call today.

## 2023-08-26 ENCOUNTER — Ambulatory Visit: Attending: Cardiovascular Disease | Admitting: Cardiovascular Disease

## 2023-08-26 ENCOUNTER — Telehealth: Payer: Self-pay

## 2023-08-26 ENCOUNTER — Encounter: Payer: Self-pay | Admitting: Cardiovascular Disease

## 2023-08-26 VITALS — BP 142/80 | HR 90 | Ht 69.0 in | Wt 221.2 lb

## 2023-08-26 DIAGNOSIS — I10 Essential (primary) hypertension: Secondary | ICD-10-CM | POA: Insufficient documentation

## 2023-08-26 DIAGNOSIS — R531 Weakness: Secondary | ICD-10-CM | POA: Diagnosis not present

## 2023-08-26 DIAGNOSIS — I2510 Atherosclerotic heart disease of native coronary artery without angina pectoris: Secondary | ICD-10-CM | POA: Insufficient documentation

## 2023-08-26 DIAGNOSIS — R5383 Other fatigue: Secondary | ICD-10-CM | POA: Insufficient documentation

## 2023-08-26 NOTE — Telephone Encounter (Signed)
 Copied from CRM [id number]. Topic: Clinical - Medical Advice >> Aug 26, 2023 11:49 AM Keitha Pata L wrote: Reason for CRM: patients daughter stated  that her father health has taken a turn for the worst and  believes he had a seizure while sleeping and needs a call to figure out what to do Cb# [phone]

## 2023-08-26 NOTE — Progress Notes (Signed)
 Chief Complaint  Patient presents with   Follow-up    CAD, dyspnea   History of Present Illness: 73 yo male with a history of CAD, DM, HTN, hyperlipidemia who is here today for cardiac follow up. Cardiac cath in 2012 with severe mid LAD stenosis treated with a drug eluting stent. Cardiac monitor 2012 with sinus, rare PVCs and PACs. Lexiscan  stress myoview  05/30/13 with LVEF=50%, possible inferior wall ischemia. Cardiac cath 06/14/13 with patent LAD stent and no disease in the small RCA or Circumflex. Echo February 2023 with LVEF=60-65%. No valve disease. No AAA by u/s in 2021. Chronic dyspnea.   He is here today for follow up. The patient denies any chest pain, dyspnea, palpitations, lower extremity edema, orthopnea, PND, dizziness, near syncope or syncope. He has noticed more fatigue and has had dizziness. No syncope.   Primary care: Ether Hercules, MD  Past Medical History:  Diagnosis Date   Allergic rhinitis    Arthritis    ASTHMA 06/08/2008   Asthma    as a child   CAD (coronary artery disease)    a. s/p Promus DES (3.0 x 16 mm) to mid LAD 05/16/10;  b. Myoview  05/15/10: anteroseptal ischemia with EF 52%;   c. Cath 05/16/10: single vessel CAD with mLAD 90% tx with PCI and EF 50%;  d. 07/2011 Cath:  Patent stent, nonobs dzs, NL EF.   Chronic kidney disease    was seen by Dr. Rolande Cleverly and released.   Colonic polyp    DIABETES MELLITUS, TYPE II 06/08/2008   GERD (gastroesophageal reflux disease)    Hemorrhoids    HYPERLIPIDEMIA 06/08/2008   HYPERTENSION 06/08/2008   Impotence of organic origin 08/24/2008   Myocardial infarction (HCC) 04/16/2010   Pneumonia    hx of   Renal vein thrombosis (HCC)    Seasonal allergies     Past Surgical History:  Procedure Laterality Date   CATARACT EXTRACTION Bilateral    2024   CORONARY ANGIOPLASTY WITH STENT PLACEMENT     LEFT HEART CATHETERIZATION WITH CORONARY ANGIOGRAM N/A 07/29/2011   Procedure: LEFT HEART CATHETERIZATION WITH  CORONARY ANGIOGRAM;  Surgeon: Odie Benne, MD;  Location: [facility] CATH LAB;  Service: Cardiovascular;  Laterality: N/A;   LEFT HEART CATHETERIZATION WITH CORONARY ANGIOGRAM N/A 06/14/2013   Procedure: LEFT HEART CATHETERIZATION WITH CORONARY ANGIOGRAM;  Surgeon: Odie Benne, MD;  Location: [facility] CATH LAB;  Service: Cardiovascular;  Laterality: N/A;   RADIOLOGY WITH ANESTHESIA N/A 06/09/2022   Procedure: MRI WITH ANESTHESIA OF LUMBAR SPINE WITHOUT CONTRAST;  Surgeon: Radiologist, Medication, MD;  Location: [facility] OR;  Service: Radiology;  Laterality: N/A;    Current Outpatient Medications  Medication Sig Dispense Refill   albuterol  (VENTOLIN  HFA) 108 (90 Base) MCG/ACT inhaler Inhale 2 puffs into the lungs every 4 (four) hours.     albuterol  (VENTOLIN  HFA) 108 (90 Base) MCG/ACT inhaler Inhale 1-2 puffs into the lungs every 6 (six) hours as needed for wheezing or shortness of breath. 8 g 0   amLODipine  (NORVASC ) 10 MG tablet Take 1 tablet (10 mg total) by mouth daily. (Patient taking differently: Take 5 mg by mouth daily.) 90 tablet 3   aspirin  81 MG tablet Take 1 tablet (81 mg total) by mouth daily. 30 tablet 0   B-D ULTRAFINE III SHORT PEN 31G X 8 MM MISC Inject 1 Syringe into the skin daily.     Cholecalciferol (VITAMIN D) 50 MCG (2000 UT) tablet Take 2,000 Units by mouth daily.  Continuous Glucose Sensor (FREESTYLE LIBRE 3 PLUS SENSOR) MISC apply to upper back of arm for 90 days change sensor every 15 days IC-10 CODE: E11.9     glucagon 1 MG injection Inject 1 mg into the vein once as needed (Low blood sugar).      Insulin  Disposable Pump (OMNIPOD DASH PODS, GEN 4,) MISC Inject into the skin.     insulin  glargine (LANTUS SOLOSTAR) 100 UNIT/ML Solostar Pen Inject 80 Units into the skin daily.  AM     Insulin  Human (INSULIN  PUMP) 100 unit/ml SOLN Inject into the skin continuous. Humalog; basal rate: 85-100 units per day.     ketorolac (ACULAR) 0.5 % ophthalmic solution Place 1 drop  into the right eye 4 (four) times daily.     lisinopril  (ZESTRIL ) 40 MG tablet Take 1 tablet (40 mg total) by mouth daily. 90 tablet 3   meclizine (ANTIVERT) 25 MG tablet Take 1 tablet (25 mg total) by mouth 3 (three) times daily as needed for dizziness. 30 tablet 0   metFORMIN  (GLUCOPHAGE -XR) 500 MG 24 hr tablet Take 1,000 mg by mouth 2 (two) times daily.     metoprolol  succinate (TOPROL -XL) 50 MG 24 hr tablet Take with or immediately following a meal. 90 tablet 3   moxifloxacin (VIGAMOX) 0.5 % ophthalmic solution Place 1 drop into the right eye 4 (four) times daily.     naproxen sodium (ALEVE) 220 MG tablet Take 440 mg by mouth 2 (two) times daily as needed (headaches).     nitroGLYCERIN  (NITROSTAT ) 0.4 MG SL tablet Place 0.4 mg under the tongue every 5 (five) minutes as needed for chest pain.      NOVOLIN R RELION 100 UNIT/ML injection USE AS DIRECTED VIA INSULIN  PUMP     Potassium 99 MG TABS Take 99 mg by mouth every evening.     prednisoLONE acetate (PRED FORTE) 1 % ophthalmic suspension Place 1 drop into the right eye 4 (four) times daily.     predniSONE (STERAPRED UNI-PAK 21 TAB) 10 MG (21) TBPK tablet Take by mouth daily. Take 6 tabs by mouth daily  for 2 days, then 5 tabs for 2 days, then 4 tabs for 2 days, then 3 tabs for 2 days, 2 tabs for 2 days, then 1 tab by mouth daily for 2 days 42 tablet 0   simvastatin  (ZOCOR ) 20 MG tablet Take 1 tablet (20 mg total) by mouth daily. 90 tablet 3   tadalafil  (CIALIS ) 20 MG tablet Take 20 mg by mouth daily as needed for erectile dysfunction.     vitamin E 400 UNIT capsule Take 400 Units by mouth every evening.     Cyanocobalamin (VITAMIN B12) 1000 MCG TBCR 2 tablets Orally Once a day (Patient not taking: Reported on 08/26/2023)     MAGNESIUM PO Take 1 tablet by mouth daily. (Patient not taking: Reported on 08/26/2023)     Pyridoxine HCl (VITAMIN B6 PO) Take 500 mg by mouth daily. (Patient not taking: Reported on 08/26/2023)     No current  facility-administered medications for this visit.    Allergies  Allergen Reactions   Dulaglutide Other (See Comments)   Invokana [Canagliflozin]     Yeast infections    Social History   Socioeconomic History   Marital status: Married    Spouse name: Not on file   Number of children: 2   Years of education: Not on file   Highest education level: Not on file  Occupational History   Occupation:  Sales  Tobacco Use   Smoking status: Former    Current packs/day: 0.00    Average packs/day: 2.0 packs/day for 53.0 years (106.0 ttl pk-yrs)    Types: Cigarettes    Start date: 05/22/1949    Quit date: 05/23/2002    Years since quitting: 21.2   Smokeless tobacco: Never  Vaping Use   Vaping status: Never Used  Substance and Sexual Activity   Alcohol use: Not Currently    Comment: Ocassional since starting Ozempic- fall 2023.   Drug use: No   Sexual activity: Not Currently  Other Topics Concern   Not on file  Social History Narrative   Not on file   Social Drivers of Health   Financial Resource Strain: Low Risk  (12/25/2022)   Overall Financial Resource Strain (CARDIA)    Difficulty of Paying Living Expenses: Not hard at all  Food Insecurity: No Food Insecurity (12/25/2022)   Hunger Vital Sign    Worried About Running Out of Food in the Last Year: Never true    Ran Out of Food in the Last Year: Never true  Transportation Needs: No Transportation Needs (12/25/2022)   PRAPARE - Administrator, Civil Service (Medical): No    Lack of Transportation (Non-Medical): No  Physical Activity: Insufficiently Active (12/25/2022)   Exercise Vital Sign    Days of Exercise per Week: 2 days    Minutes of Exercise per Session: 30 min  Stress: No Stress Concern Present (12/25/2022)   [employer] of Occupational Health - Occupational Stress Questionnaire    Feeling of Stress : Not at all  Social Connections: Socially Integrated (12/25/2022)   Social Connection and Isolation  Panel    Frequency of Communication with Friends and Family: More than three times a week    Frequency of Social Gatherings with Friends and Family: More than three times a week    Attends Religious Services: More than 4 times per year    Active Member of [employer] or Organizations: Yes    Attends Engineer, structural: More than 4 times per year    Marital Status: Married  Catering manager Violence: Not At Risk (12/25/2022)   Humiliation, Afraid, Rape, and Kick questionnaire    Fear of Current or Ex-Partner: No    Emotionally Abused: No    Physically Abused: No    Sexually Abused: No    Family History  Problem Relation Age of Onset   Diabetes Father    Heart disease Father    Angina Father    Dementia Mother     Review of Systems:  As stated in the HPI and otherwise negative.   BP (!) 142/80 (BP Location: Right Arm, Patient Position: Sitting)   Pulse 90   Ht 5' 9 (1.753 m)   Wt 221 lb 3.2 oz (100.3 kg)   SpO2 95%   BMI 32.67 kg/m   Physical Examination: General: Well developed, well nourished, NAD  HEENT: OP clear, mucus membranes moist  SKIN: warm, dry. No rashes. Neuro: No focal deficits  Musculoskeletal: Muscle strength 5/5 all ext  Psychiatric: Mood and affect normal  Neck: No JVD, no carotid bruits, no thyromegaly, no lymphadenopathy.  Lungs:Clear bilaterally, no wheezes, rhonci, crackles Cardiovascular: Regular rate and rhythm. No murmurs, gallops or rubs. Abdomen:Soft. Bowel sounds present. Non-tender.  Extremities: No lower extremity edema. Pulses are 2 + in the bilateral DP/PT.  EKG:  EKG is not ordered today. The ekg ordered today demonstrates  eKG from  08/22/23 with sinus, PACs, RBBB  Recent Labs: 08/22/2023: ALT 19; B Natriuretic Peptide 32.1; BUN 9; Creatinine, Ser 0.74; Hemoglobin 14.2; Magnesium 1.8; Platelets 206; Potassium 4.2; Sodium 141; TSH 0.743   Lipid Panel    Component Value Date/Time   CHOL 102 08/19/2023 0940   TRIG 91.0 08/19/2023  0940   HDL 34.80 (L) 08/19/2023 0940   CHOLHDL 3 08/19/2023 0940   VLDL 18.2 08/19/2023 0940   LDLCALC 49 08/19/2023 0940   LDLDIRECT 73.0 06/21/2014 0841     Wt Readings from Last 3 Encounters:  08/26/23 221 lb 3.2 oz (100.3 kg)  08/23/23 220 lb (99.8 kg)  08/22/23 220 lb 0.3 oz (99.8 kg)    Assessment and Plan:   1. CAD without angina: No chest pain but given his fatigue and dizziness, will arrange PET CT stress to exclude ischemia and echo to assess LV function. Continue ASA, statin and beta blocker.   2. HTN: BP is well controlled. Continue current therapy  3. Hyperlipidemia: Lipids followed in endocrine clinic. Continue statin  4. Chronic diastolic CHF: Weight is stable. No volume overload on exam.  5. Fatigue: He has no murmur on exam. His BP and heart rate are normal. Stress test and echo as above. I suspect his fatigue is not cardiac related. He has a brain MRI and CT abd/pelvis scheduled per primary care for next week.   Labs/ tests ordered today include:   Orders Placed This Encounter  Procedures   NM PET CT CARDIAC PERFUSION MULTI W/ABSOLUTE BLOODFLOW   ECHOCARDIOGRAM COMPLETE   Disposition:   F/U with me in 12 months  Signed, Antoinette Batman, MD 08/26/2023 10:51 AM    [facility] [address] [place], [place], [place]  [address] Phone: [phone]; Fax: [phone]

## 2023-08-26 NOTE — Telephone Encounter (Signed)
 Hospital follow up recently, notes seen cardiology this morning and checked out okay, notes his wife has noticed potential seizure like activity. Should we make an appointment to see you again or should he have emergency evaluation?

## 2023-08-26 NOTE — Telephone Encounter (Signed)
 I spoke with the patient by phone.  Symptoms are low risk for seizure.  He sounds well, about the same as when I saw him in clinic earlier this week.  I think worsening confusion may be partly explained by recent prescription of prednisone and meclizine from the emergency department.  I recommend that he stop those medications.  No need for follow-up appointments with me sooner right now.  I will follow-up with him after we see the results of the imaging, currently scheduled for June 23.  Patient is going to follow-up with the radiology center and see if he can get in sooner if there is a cancellation.

## 2023-08-26 NOTE — Patient Instructions (Addendum)
 Medication Instructions:  No changes *If you need a refill on your cardiac medications before your next appointment, please call your pharmacy*  Lab Work: none   Testing/Procedures: Your physician has requested that you have an echocardiogram. Echocardiography is a painless test that uses sound waves to create images of your heart. It provides your doctor with information about the size and shape of your heart and how well your heart's chambers and valves are working. This procedure takes approximately one hour. There are no restrictions for this procedure. Please do NOT wear cologne, perfume, aftershave, or lotions (deodorant is allowed). Please arrive 15 minutes prior to your appointment time.  Please note: We ask at that you not bring children with you during ultrasound (echo/ vascular) testing. Due to room size and safety concerns, children are not allowed in the ultrasound rooms during exams. Our front office staff cannot provide observation of children in our lobby area while testing is being conducted. An adult accompanying a patient to their appointment will only be allowed in the ultrasound room at the discretion of the ultrasound technician under special circumstances. We apologize for any inconvenience.   Follow-Up: At [facility], you and your health needs are our priority.  As part of our continuing mission to provide you with exceptional heart care, our providers are all part of one team.  This team includes your primary Cardiologist (physician) and Advanced Practice Providers or APPs (Physician Assistants and Nurse Practitioners) who all work together to provide you with the care you need, when you need it.  Your next appointment:   12 month(s)  Provider:   Antoinette Batman, MD       Please report to Radiology at the [facility] Main Entrance 30 minutes early for your test.  [address] [place], [place] [address]   How to Prepare for Your  Cardiac PET/CT Stress Test:  Nothing to eat or drink, except water, 3 hours prior to arrival time.  NO caffeine/decaffeinated products, or chocolate 12 hours prior to arrival. (Please note decaffeinated beverages (teas/coffees) still contain caffeine).  If you have caffeine within 12 hours prior, the test will need to be rescheduled.  Medication instructions: Do not take erectile dysfunction medications for 72 hours prior to test (sildenafil, tadalafil ) Do not take nitrates (isosorbide mononitrate, Ranexa) the day before or day of test Do not take tamsulosin the day before or morning of test Hold theophylline containing medications for 12 hours. Hold Dipyridamole 48 hours prior to the test.  Diabetic Preparation: If able to eat breakfast prior to 3 hour fasting, you may take all medications, including your insulin . Do not worry if you miss your breakfast dose of insulin  - start at your next meal. If you do not eat prior to 3 hour fast-Hold all diabetes (oral and insulin ) medications. Patients who wear a continuous glucose monitor MUST remove the device prior to scanning.  You may take your remaining medications with water.  NO perfume, cologne or lotion on chest or abdomen area.  Total time is 1 to 2 hours; you may want to bring reading material for the waiting time.   In preparation for your appointment, medication and supplies will be purchased.  Appointment availability is limited, so if you need to cancel or reschedule, please call the Radiology Department Scheduler at [phone] 24 hours in advance to avoid a cancellation fee of $100.00  What to Expect When you Arrive:  Once you arrive and check in for your  appointment, you will be taken to a preparation room within the Radiology Department.  A technologist or Nurse will obtain your medical history, verify that you are correctly prepped for the exam, and explain the procedure.  Afterwards, an IV will be started in your arm and  electrodes will be placed on your skin for EKG monitoring during the stress portion of the exam. Then you will be escorted to the PET/CT scanner.  There, staff will get you positioned on the scanner and obtain a blood pressure and EKG.  During the exam, you will continue to be connected to the EKG and blood pressure machines.  A small, safe amount of a radioactive tracer will be injected in your IV to obtain a series of pictures of your heart along with an injection of a stress agent.    After your Exam:  It is recommended that you eat a meal and drink a caffeinated beverage to counter act any effects of the stress agent.  Drink plenty of fluids for the remainder of the day and urinate frequently for the first couple of hours after the exam.  Your doctor will inform you of your test results within 7-10 business days.  For more information and frequently asked questions, please visit our website: [email or web address]  For questions about your test or how to prepare for your test, please call: Cardiac Imaging Nurse Navigators Office: [phone]

## 2023-08-27 ENCOUNTER — Telehealth: Payer: Self-pay | Admitting: Student in an Organized Health Care Education/Training Program

## 2023-08-27 DIAGNOSIS — R519 Headache, unspecified: Secondary | ICD-10-CM

## 2023-08-27 NOTE — Telephone Encounter (Signed)
 Copied from CRM [id number]. Topic: Referral - Question >> Aug 27, 2023  9:29 AM Joel Williams wrote: Reason for CRM: Patient's daughter, Joel Williams, called in stating that patient's MRI is not scheduled until end of June at [facility], Joel Williams is questioning if this referral can get sent to [facility] on [address] in [place] instead as they have openings next week. Callback number for Joel Williams is [phone].

## 2023-08-27 NOTE — Telephone Encounter (Signed)
 Called patients daughter back and did verify the new referral placed for [facility]. Patients daughter verbalized understanding and did ask if he happened to place a new referral for CT there as well. I did tell her this was not placed but she stated it was not a problem that the MRI is more important at the moment and she will reach out if they need the CT referral changed

## 2023-08-27 NOTE — Telephone Encounter (Signed)
 Ok.  I placed a new MRI brain order and send it to [facility] at [facility].

## 2023-08-27 NOTE — Telephone Encounter (Signed)
 Patients daughter is wanting MRI referral resent to [facility] on [address] due to sooner availability?

## 2023-08-30 ENCOUNTER — Ambulatory Visit
Admission: RE | Admit: 2023-08-30 | Discharge: 2023-08-30 | Disposition: A | Discharge status: Home / Self Care | Source: Ambulatory Visit | Attending: Student in an Organized Health Care Education/Training Program | Admitting: Student in an Organized Health Care Education/Training Program

## 2023-08-30 DIAGNOSIS — R519 Headache, unspecified: Secondary | ICD-10-CM

## 2023-08-30 DIAGNOSIS — G9389 Other specified disorders of brain: Secondary | ICD-10-CM | POA: Diagnosis not present

## 2023-08-31 ENCOUNTER — Telehealth: Payer: Self-pay

## 2023-08-31 ENCOUNTER — Encounter: Payer: Self-pay | Admitting: Surgery

## 2023-08-31 NOTE — Telephone Encounter (Signed)
 Copied from CRM [id number]. Topic: Clinical - Lab/Test Results >> Aug 31, 2023  2:10 PM Baldo Levan wrote: Reason for CRM: Leary Provencal patients daughter called in requesting to go over the patients MRI results. The patient and daughter are very worried about the results that came through mychart and are requesting these as soon as possible. Please contact the patient back.

## 2023-08-31 NOTE — Telephone Encounter (Signed)
 Chart reviewed in Dr. Tana Falls absence.  Office visit June 9, fatigue, weight loss.  CT abdomen pelvis ordered.  However he had also had headaches for the previous year that were reportedly increasing in frequency and severity, along with gait ataxia and imbalance, along with nocturnal symptoms.  CT abdomen with pelvis still pending, but MRI brain with contrast results noted.  5.5 cm mass in the right temporal occipital lobe, mild surrounding vasogenic edema and local mass effect but no midline shift.  Differential included primary CNS neoplasm and metastatic disease among other etiologies with recommended contrast-enhanced MRI for further evaluation.  Mild chronic microvascular ischemic changes and mild parenchymal volume loss also noted, as well as focal susceptibility in the lateral right cerebellum which may reflect chronic microhemorrhage or small vascular malformation.   Called patient to discuss results. They had seen results already and I reviewed above results with likelihood of brain tumor/mass.  He still feels dizzy, confused at times.  Has been using a walker past few days - balance has worsened past few days.  Similar headaches. No recent changes.   Nausea during MRI but no vomiting.  Call placed to neurosurgery on call.  Spoke with Easter Golden, who reviewed chart.  She also spoke with Dr. Nat Badger, with recommendation for eval tomorrow at [facility] at 4pm. Their office will also call in am with further information.  I called patient back with this information and plan and all questions answered.

## 2023-08-31 NOTE — Telephone Encounter (Signed)
 Copied from CRM [id number]. Topic: Clinical - Lab/Test Results >> Aug 31, 2023  8:43 AM Joel Williams I wrote: Reason for CRM: Diane from MRI imaging called to notify that patients mri was done yesterday and the results are in.

## 2023-08-31 NOTE — Telephone Encounter (Signed)
 Results are in, if you could comment when you return it would be appreciated. I did call pt and made aware Dr Gayl Katos is out of the office but will have other provider(s) review for anything needing emergent actions.

## 2023-09-01 ENCOUNTER — Other Ambulatory Visit

## 2023-09-01 ENCOUNTER — Other Ambulatory Visit (HOSPITAL_COMMUNITY): Payer: Self-pay | Admitting: Neurosurgery

## 2023-09-01 ENCOUNTER — Other Ambulatory Visit: Payer: Self-pay | Admitting: Radiation Therapy

## 2023-09-01 DIAGNOSIS — D496 Neoplasm of unspecified behavior of brain: Secondary | ICD-10-CM | POA: Diagnosis not present

## 2023-09-02 ENCOUNTER — Ambulatory Visit
Admission: RE | Admit: 2023-09-02 | Discharge: 2023-09-02 | Disposition: A | Discharge status: Home / Self Care | Source: Ambulatory Visit | Attending: Neurosurgery | Admitting: Neurosurgery

## 2023-09-02 ENCOUNTER — Other Ambulatory Visit: Payer: Self-pay | Admitting: Neurosurgery

## 2023-09-02 ENCOUNTER — Ambulatory Visit: Payer: Self-pay | Admitting: Family Medicine

## 2023-09-02 ENCOUNTER — Ambulatory Visit (HOSPITAL_COMMUNITY)
Admission: RE | Admit: 2023-09-02 | Discharge: 2023-09-02 | Disposition: A | Discharge status: Home / Self Care | Source: Ambulatory Visit | Attending: Neurosurgery | Admitting: Neurosurgery

## 2023-09-02 ENCOUNTER — Ambulatory Visit
Admission: RE | Admit: 2023-09-02 | Discharge: 2023-09-02 | Disposition: A | Discharge status: Home / Self Care | Source: Ambulatory Visit | Attending: Student in an Organized Health Care Education/Training Program | Admitting: Student in an Organized Health Care Education/Training Program

## 2023-09-02 DIAGNOSIS — D496 Neoplasm of unspecified behavior of brain: Secondary | ICD-10-CM

## 2023-09-02 DIAGNOSIS — R220 Localized swelling, mass and lump, head: Secondary | ICD-10-CM | POA: Diagnosis not present

## 2023-09-02 DIAGNOSIS — R918 Other nonspecific abnormal finding of lung field: Secondary | ICD-10-CM | POA: Diagnosis not present

## 2023-09-02 DIAGNOSIS — R634 Abnormal weight loss: Secondary | ICD-10-CM

## 2023-09-02 DIAGNOSIS — K7689 Other specified diseases of liver: Secondary | ICD-10-CM | POA: Diagnosis not present

## 2023-09-02 DIAGNOSIS — J439 Emphysema, unspecified: Secondary | ICD-10-CM | POA: Diagnosis not present

## 2023-09-02 DIAGNOSIS — N200 Calculus of kidney: Secondary | ICD-10-CM | POA: Diagnosis not present

## 2023-09-02 DIAGNOSIS — J320 Chronic maxillary sinusitis: Secondary | ICD-10-CM | POA: Diagnosis not present

## 2023-09-02 MED ORDER — GADOBUTROL 1 MMOL/ML IV SOLN: 10.0000 mL | Freq: Once | INTRAVENOUS | Status: AC | PRN

## 2023-09-02 MED ORDER — IOPAMIDOL (ISOVUE-300) INJECTION 61%
100.0000 mL | Freq: Once | INTRAVENOUS | Status: AC | PRN
  Administered 2023-09-02: 100 mL via INTRAVENOUS

## 2023-09-06 ENCOUNTER — Ambulatory Visit (HOSPITAL_COMMUNITY)

## 2023-09-06 ENCOUNTER — Ambulatory Visit: Admitting: Student in an Organized Health Care Education/Training Program

## 2023-09-06 ENCOUNTER — Inpatient Hospital Stay: Attending: Neurosurgery

## 2023-09-06 DIAGNOSIS — D496 Neoplasm of unspecified behavior of brain: Secondary | ICD-10-CM | POA: Diagnosis not present

## 2023-09-07 ENCOUNTER — Telehealth: Payer: Self-pay

## 2023-09-07 ENCOUNTER — Other Ambulatory Visit: Payer: Self-pay | Admitting: Radiation Therapy

## 2023-09-07 ENCOUNTER — Ambulatory Visit: Admitting: Student in an Organized Health Care Education/Training Program

## 2023-09-07 ENCOUNTER — Other Ambulatory Visit: Payer: Self-pay | Admitting: Neurosurgery

## 2023-09-07 NOTE — Telephone Encounter (Signed)
   Pre-operative Risk Assessment    Patient Name: Joel Williams  DOB: 1950/06/11 MRN: [id number]   Date of last office visit: 08/26/23 LONNI CASH, MD Date of next office visit: NONE   Request for Surgical Clearance    Procedure:  CRANIOTOMY  Date of Surgery:  Clearance 09/14/23                                Surgeon:  DR GERLDINE MAIZES Surgeon's Group or Practice Name:  [facility] Phone number:  [phone]  EXT 8221 Fax number:  [phone]  OR   [phone]    ATTN: NIKKI   Type of Clearance Requested:   - Medical  - Pharmacy:  Hold Aspirin      Type of Anesthesia:  General    Additional requests/questions:    SignedLucie DELENA Ku   09/07/2023, 8:51 AM

## 2023-09-09 NOTE — Progress Notes (Signed)
 Surgical Instructions   Your procedure is scheduled on Tuesday July 1. Report to [facility] Main Entrance A at 5:30 A.M., then check in with the Admitting office. Any questions or running late day of surgery: call [phone]  Questions prior to your surgery date: call [phone], Monday-Friday, 8am-4pm. If you experience any cold or flu symptoms such as cough, fever, chills, shortness of breath, etc. between now and your scheduled surgery, please notify us  at the above number.     Remember:  Do not eat or drink anything after midnight the night before your surgery   Take these medicines the morning of surgery with A SIP OF WATER  metoprolol  succinate (TOPROL -XL)   May take these medicines IF NEEDED: albuterol  (VENTOLIN  HFA) - please bring inhaler to the hospital with you.  One week prior to surgery, STOP taking any Aleve, Naproxen, Ibuprofen, Motrin, Advil, Goody's, BC's, all herbal medications, fish oil, and non-prescription vitamins.  Follow your surgeon's instructions for holding aspirin . If no instructions were given, you will need to call the office.           WHAT DO I DO ABOUT MY DIABETES MEDICATION?  Do not take oral diabetes medicines (pills) the morning of surgery. DO NOT TAKE metFORMIN  (GLUCOPHAGE -XR) the morning of surgery.   THE MORNING OF SURGERY, take 40 units (1/2 of your normal dose) of insulin  glargine (LANTUS SOLOSTAR) insulin .  SEMAGLUTIDE should be held for 7 days prior to surgery. Your     last dose should be no later than 09/06/23.  For preoperative management of your Omnipod insulin  pump, contact the physician who manages it OR reduce the basal rate by 20% at midnight the day of your surgery.   HOW TO MANAGE YOUR DIABETES BEFORE AND AFTER SURGERY  Why is it important to control my blood sugar before and after surgery? Improving blood sugar levels before and after surgery helps healing and can limit problems. A way of improving blood sugar control is  eating a healthy diet by:  Eating less sugar and carbohydrates  Increasing activity/exercise  Talking with your doctor about reaching your blood sugar goals High blood sugars (greater than 180 mg/dL) can raise your risk of infections and slow your recovery, so you will need to focus on controlling your diabetes during the weeks before surgery. Make sure that the doctor who takes care of your diabetes knows about your planned surgery including the date and location.  How do I manage my blood sugar before surgery? Check your blood sugar at least 4 times a day, starting 2 days before surgery, to make sure that the level is not too high or low.  Check your blood sugar the morning of your surgery when you wake up and every 2 hours until you get to the Short Stay unit.  If your blood sugar is less than 70 mg/dL, you will need to treat for low blood sugar: Do not take insulin . Treat a low blood sugar (less than 70 mg/dL) with  cup of clear juice (cranberry or apple), 4 glucose tablets, OR glucose gel. Recheck blood sugar in 15 minutes after treatment (to make sure it is greater than 70 mg/dL). If your blood sugar is not greater than 70 mg/dL on recheck, call [phone] for further instructions. Report your blood sugar to the short stay nurse when you get to Short Stay.  If you are admitted to the hospital after surgery: Your blood sugar will be checked by the staff and you will  probably be given insulin  after surgery (instead of oral diabetes medicines) to make sure you have good blood sugar levels. The goal for blood sugar control after surgery is 80-180 mg/dL.            Do NOT Smoke (Tobacco/Vaping) for 24 hours prior to your procedure.  If you use a CPAP at night, you may bring your mask/headgear for your overnight stay.   You will be asked to remove any contacts, glasses, piercing's, hearing aid's, dentures/partials prior to surgery. Please bring cases for these items if needed.     Patients discharged the day of surgery will not be allowed to drive home, and someone needs to stay with them for 24 hours.  SURGICAL WAITING ROOM VISITATION Patients may have no more than 2 support people in the waiting area - these visitors may rotate.   Pre-op nurse will coordinate an appropriate time for 1 ADULT support person, who may not rotate, to accompany patient in pre-op.  Children under the age of 44 must have an adult with them who is not the patient and must remain in the main waiting area with an adult.  If the patient needs to stay at the hospital during part of their recovery, the visitor guidelines for inpatient rooms apply.  Please refer to the [facility] website for the visitor guidelines for any additional information.   If you received a COVID test during your pre-op visit  it is requested that you wear a mask when out in public, stay away from anyone that may not be feeling well and notify your surgeon if you develop symptoms. If you have been in contact with anyone that has tested positive in the last 10 days please notify you surgeon.      Pre-operative CHG Bathing Instructions   You can play a key role in reducing the risk of infection after surgery. Your skin needs to be as free of germs as possible. You can reduce the number of germs on your skin by washing with CHG (chlorhexidine  gluconate) soap before surgery. CHG is an antiseptic soap that kills germs and continues to kill germs even after washing.   DO NOT use if you have an allergy to chlorhexidine /CHG or antibacterial soaps. If your skin becomes reddened or irritated, stop using the CHG and notify one of our RNs at [phone].              TAKE A SHOWER THE NIGHT BEFORE SURGERY AND THE DAY OF SURGERY    Please keep in mind the following:  DO NOT shave, including legs and underarms, 48 hours prior to surgery.   You may shave your face before/day of surgery.  Place clean sheets on your bed the night  before surgery Use a clean washcloth (not used since being washed) for each shower. DO NOT sleep with pet's night before surgery.  CHG Shower Instructions:  Wash your face and private area with normal soap. If you choose to wash your hair, wash first with your normal shampoo.  After you use shampoo/soap, rinse your hair and body thoroughly to remove shampoo/soap residue.  Turn the water OFF and apply half the bottle of CHG soap to a CLEAN washcloth.  Apply CHG soap ONLY FROM YOUR NECK DOWN TO YOUR TOES (washing for 3-5 minutes)  DO NOT use CHG soap on face, private areas, open wounds, or sores.  Pay special attention to the area where your surgery is being performed.  If you are  having back surgery, having someone wash your back for you may be helpful. Wait 2 minutes after CHG soap is applied, then you may rinse off the CHG soap.  Pat dry with a clean towel  Put on clean pajamas    Additional instructions for the day of surgery: DO NOT APPLY any lotions, deodorants, cologne, or perfumes.   Do not wear jewelry or makeup Do not wear nail polish, gel polish, artificial nails, or any other type of covering on natural nails (fingers and toes) Do not bring valuables to the hospital. [facility] is not responsible for valuables/personal belongings. Put on clean/comfortable clothes.  Please brush your teeth.  Ask your nurse before applying any prescription medications to the skin.

## 2023-09-10 ENCOUNTER — Other Ambulatory Visit: Payer: Self-pay

## 2023-09-10 ENCOUNTER — Encounter (HOSPITAL_COMMUNITY): Payer: Self-pay

## 2023-09-10 ENCOUNTER — Encounter (HOSPITAL_COMMUNITY)
Admission: RE | Admit: 2023-09-10 | Discharge: 2023-09-10 | Disposition: A | Discharge status: Home / Self Care | Source: Ambulatory Visit | Attending: Neurosurgery | Admitting: Neurosurgery

## 2023-09-10 VITALS — BP 135/77 | HR 81 | Temp 97.6°F | Resp 18 | Ht 69.0 in | Wt 218.9 lb

## 2023-09-10 DIAGNOSIS — N189 Chronic kidney disease, unspecified: Secondary | ICD-10-CM | POA: Insufficient documentation

## 2023-09-10 DIAGNOSIS — Z794 Long term (current) use of insulin: Secondary | ICD-10-CM | POA: Diagnosis not present

## 2023-09-10 DIAGNOSIS — D496 Neoplasm of unspecified behavior of brain: Secondary | ICD-10-CM | POA: Insufficient documentation

## 2023-09-10 DIAGNOSIS — J45909 Unspecified asthma, uncomplicated: Secondary | ICD-10-CM | POA: Insufficient documentation

## 2023-09-10 DIAGNOSIS — Z86718 Personal history of other venous thrombosis and embolism: Secondary | ICD-10-CM | POA: Insufficient documentation

## 2023-09-10 DIAGNOSIS — Z7984 Long term (current) use of oral hypoglycemic drugs: Secondary | ICD-10-CM | POA: Insufficient documentation

## 2023-09-10 DIAGNOSIS — I2510 Atherosclerotic heart disease of native coronary artery without angina pectoris: Secondary | ICD-10-CM | POA: Diagnosis not present

## 2023-09-10 DIAGNOSIS — E1122 Type 2 diabetes mellitus with diabetic chronic kidney disease: Secondary | ICD-10-CM | POA: Diagnosis not present

## 2023-09-10 DIAGNOSIS — Z01812 Encounter for preprocedural laboratory examination: Secondary | ICD-10-CM | POA: Diagnosis not present

## 2023-09-10 DIAGNOSIS — Z87891 Personal history of nicotine dependence: Secondary | ICD-10-CM | POA: Insufficient documentation

## 2023-09-10 DIAGNOSIS — Z01818 Encounter for other preprocedural examination: Secondary | ICD-10-CM

## 2023-09-10 DIAGNOSIS — I129 Hypertensive chronic kidney disease with stage 1 through stage 4 chronic kidney disease, or unspecified chronic kidney disease: Secondary | ICD-10-CM | POA: Diagnosis not present

## 2023-09-10 DIAGNOSIS — I252 Old myocardial infarction: Secondary | ICD-10-CM | POA: Diagnosis not present

## 2023-09-10 DIAGNOSIS — Z955 Presence of coronary angioplasty implant and graft: Secondary | ICD-10-CM | POA: Insufficient documentation

## 2023-09-10 LAB — TYPE AND SCREEN
ABO/RH(D): A POS
Antibody Screen: NEGATIVE

## 2023-09-10 LAB — GLUCOSE, CAPILLARY: Glucose-Capillary: 255 mg/dL — ABNORMAL HIGH (ref 70–99)

## 2023-09-10 NOTE — Progress Notes (Signed)
 PCP - Debby Specking Duncan,MD Cardiologist - Lonni Verlin COME Endocrinologist - Reyes Kerr,MD   PPM/ICD - denies Device Orders -  Rep Notified -   Chest x-ray - 08/22/23 EKG - 08/22/23 Stress Test - 05/31/23 ECHO - 05/13/21 Cardiac Cath - 06/14/13  Sleep Study - denies CPAP -   Fasting Blood Sugar - 160's Checks Blood Sugar- has a Freestyle libre  Last dose of GLP1 agonist-  08/23/23. Pt states this medicine has been discontinued by Dr. Specking GLP1 instructions:   Blood Thinner Instructions: na Aspirin  Instructions:per cardiology 09/07/23-hold aspirin .   ERAS Protcol - no.  PRE-SURGERY Ensure or G2-   COVID TEST- na   Anesthesia review: yes-CAD- LAD stenosis with stent. Pt stated he had removed his Omnipod this morning and had stopped taking his Lantus insulin . I told patient and his wife that it is very important that he reapply the Omnipod and continue to take his Lantus insulin  when he gets home unless his physician instructs him to do otherwise. Unclear as to why he had stopped these insulins. I explained that without the insulin  his blood sugar could become dangerously high putting his health and his surgery in jeopardy. I encouraged him to call his endocrinologist. Patient and his wife agreed that they would call. Patient's wife stated that patient had removed Omnipod that morning and was too tired to reapply.   Patient denies shortness of breath, fever, cough and chest pain at PAT appointment   All instructions explained to the patient, with a verbal understanding of the material. Patient agrees to go over the instructions while at home for a better understanding.  The opportunity to ask questions was provided.

## 2023-09-13 NOTE — Telephone Encounter (Signed)
   Patient Name: Joel Williams  DOB: 1950/03/23 MRN: [id number]  Primary Cardiologist: Lonni Cash, MD  Chart reviewed as part of pre-operative protocol coverage. Given past medical history and time since last visit, based on ACC/AHA guidelines, JACQUE GARRELS is at acceptable risk for the planned procedure without further cardiovascular testing.   Per Dr. Cash patient is fine to proceed with scheduled craniotomy without further cardiac testing completed at this time.  He is fine to hold aspirin  5 to 7 days prior to procedure and should restart postprocedure when surgically safe.  The patient was advised that if he develops new symptoms prior to surgery to contact our office to arrange for a follow-up visit, and he verbalized understanding.  I will route this recommendation to the requesting party via Epic fax function and remove from pre-op pool.  Please call with questions.  Wyn Raddle, Jackee Shove, NP 09/13/2023, 1:39 PM

## 2023-09-13 NOTE — Anesthesia Preprocedure Evaluation (Signed)
 Anesthesia Evaluation   Patient awake    Reviewed: Allergy & Precautions, NPO status , Patient's Chart, lab work & pertinent test results  History of Anesthesia Complications Negative for: history of anesthetic complications  Airway Mallampati: IV  TM Distance: <3 FB Neck ROM: Full    Dental  (+) Dental Advisory Given   Pulmonary neg shortness of breath, asthma , neg sleep apnea, neg COPD, neg recent URI, former smoker   breath sounds clear to auscultation       Cardiovascular hypertension, Pt. on medications + CAD, + Past MI, + Cardiac Stents and +CHF   Rhythm:Regular   1. Left ventricular ejection fraction, by estimation, is 60 to 65%. The  left ventricle has normal function. The left ventricle has no regional  wall motion abnormalities. There is mild left ventricular hypertrophy.  Left ventricular diastolic parameters  were normal. The average left ventricular global longitudinal strain is  -24.5 %. The global longitudinal strain is normal.   2. Right ventricular systolic function is normal. The right ventricular  size is normal. Tricuspid regurgitation signal is inadequate for assessing  PA pressure.   3. No evidence of mitral valve regurgitation.   4. The aortic valve is grossly normal. Aortic valve regurgitation is not  visualized.   5. The inferior vena cava is normal in size with greater than 50%  respiratory variability, suggesting right atrial pressure of 3 mmHg.      Neuro/Psych  Headaches  BRAIN TUMOR  negative psych ROS   GI/Hepatic Neg liver ROS,GERD  ,,  Endo/Other  diabetes, Insulin  Dependent    Renal/GU Renal diseaseLab Results      Component                Value               Date                      NA                       141                 08/22/2023                K                        4.2                 08/22/2023                CO2                      30                  08/22/2023                 GLUCOSE                  149 (H)             08/22/2023                BUN                      9                   08/22/2023  CREATININE               0.74                08/22/2023                CALCIUM                  9.6                 08/22/2023                GFR                      90.53               08/19/2023                GFRNONAA                 >60                 08/22/2023                Musculoskeletal  (+) Arthritis ,    Abdominal   Peds  Hematology Lab Results      Component                Value               Date                      WBC                      5.1                 08/22/2023                HGB                      14.2                08/22/2023                HCT                      42.3                08/22/2023                MCV                      83.1                08/22/2023                PLT                      206                 08/22/2023              Anesthesia Other Findings (NSTEMI, DES LAD 05/16/10), GERD, childhood asthma, 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    Last cardiology follow-up was on 08/26/23 with Dr. Verlin.  He noted symptoms of dizziness and fatigue with pending imaging. No chest pain. He suspected fatigue was not cardiac  related but discussed arranging PET CT stress test and echo to assess LV function, otherwise 12 month follow-up was planned. Echo is scheduled for 10/08/23 and stress test for 11/10/23. Currently last TTE was on 05/13/21 and showed LVEF 60-65%, no regional wall motion abnormalities, mild LVH, normal diastolic parameters, normal RV systolic function.    A1c on 08/11/23 at visit with endocrinologist Dr. Faythe was 7.6%. He has a Omnipod pump with NovolinR 100 unit/mL, metformin  XR 1000 mg BID, semaglutide 2 mg weekly, Lantus 80 units Q AM. He reported semaglutide was recently held on 08/23/23 by PCP. He had reported not taking his  Lantus and removing his Omnipod at his 09/10/23 PAT visit. He was instructed to resume and given perioperative instructions and encouraged to contact Dr. Faythe if additional questions regarding DM management. He also has a Theatre stage manager.    09/06/23 office note by Dr. Lanis is scanned under Media tab. After discussion at the multi-disciplinary Neuro-Oncology conference the consensus opinion was to proceed with maximal surgical resection. ASA was placed on hold. Would anticipate surgery is time sensitive given he likely has a symptomatic brain tumor; However, Dr. Valeda office did request preoperative cardiology risk assessment. Discussed with anesthesiologist Maryclare Cornet, MD and reached out to Dr. Verlin and/or covering APP for follow-up. Wyn Jackee Raddle, NP discussed with Dr. Verlin who felt it was okay with Mr. Pasha proceeding with scheduled craniotomy (acceptable risk) without further cardiac testing completed at this time. Okay to hold ASA 5-7 days for surgery and resume when surgically safe.    Reproductive/Obstetrics                              Anesthesia Physical Anesthesia Plan  ASA: 3  Anesthesia Plan: General   Post-op Pain Management: Ofirmev  IV (intra-op)*   Induction: Intravenous  PONV Risk Score and Plan: 2 and Ondansetron  and Dexamethasone  Airway Management Planned: Oral ETT  Additional Equipment: Arterial line  Intra-op Plan:   Post-operative Plan: Extubation in OR and Possible Post-op intubation/ventilation  Informed Consent: I have reviewed the patients History and Physical, chart, labs and discussed the procedure including the risks, benefits and alternatives for the proposed anesthesia with the patient or authorized representative who has indicated his/her understanding and acceptance.     Dental advisory given  Plan Discussed with: CRNA  Anesthesia Plan Comments: (See PAT note written 09/13/2023 by  Rue Tinnel, PA-C.  )        Anesthesia Quick Evaluation

## 2023-09-13 NOTE — Progress Notes (Signed)
 Anesthesia Chart Review:  Case: [id number] Date/Time: 09/14/23 0715   Procedures:      CRANIOTOMY TUMOR EXCISION (Right) - STEREOTACTIC RIGHT TEMPRO-OCCIPITAL CRANI FOR RESECTION OF TUMOR     COMPUTER-ASSISTED NAVIGATION, FOR CRANIAL PROCEDURE (Right)   Anesthesia type: General   Diagnosis: Brain tumor (HCC) [D49.6]   Pre-op diagnosis: BRAIN TUMOR   [facility] ROOM [facility] / [facility]   Surgeons: Lanis Pupa, MD       DISCUSSION:  Patient is a 73 year old male scheduled for the above procedure.  He had ED visit on 08/22/23 for fatigue, loss of appetite with 10 lb weight loss in 3 months, dizziness (with prior history of vertigo), productive cough for a few months. CXR consistent with bronchitic changes. He was discharged with albuterol  MDI as needed, meclizine  as needed, and prednisone  taper. He had PCP follow-up on 08/23/23 and also reported feeling off balance. A CT chest/abd/pelvis and brain MRI were ordered. Brain MRI expedited after daughter called and concerned her dad may have had seizure like activity, although after Dr. Cleatus spoke with patient he felt symptoms were low risk for seizure. 08/30/23 brain MRI findings included a 5.5 cm mass in the right temporal occipital lobes with mild surrounding vasogenic edema and local mass effect, no midline shift. Contrast enhanced MRI recommended and referral to neurosurgery. 09/02/23 CT findings included evidence of chronic liver disease with no splenomegaly, few old calcified granulomas in the lung, stable right thyroid  lobe enlargement. Above procedure now recommended for treatment of brain tumor.   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    Last cardiology follow-up was on 08/26/23 with Dr. Verlin.  He noted symptoms of dizziness and fatigue with pending imaging. No  chest pain. He suspected fatigue was not cardiac related but discussed arranging PET CT stress test and echo to assess LV function, otherwise 12 month follow-up was planned. Echo is scheduled for 10/08/23 and stress test for 11/10/23. Currently last TTE was on 05/13/21 and showed LVEF 60-65%, no regional wall motion abnormalities, mild LVH, normal diastolic parameters, normal RV systolic function.    A1c on 08/11/23 at visit with endocrinologist Dr. Faythe was 7.6%. He has a Omnipod pump with NovolinR 100 unit/mL, metformin  XR 1000 mg BID, semaglutide 2 mg weekly, Lantus 80 units Q AM. He reported semaglutide was recently held on 08/23/23 by PCP. He had reported not taking his Lantus and removing his Omnipod at his 09/10/23 PAT visit. He was instructed to resume and given perioperative instructions and encouraged to contact Dr. Faythe if additional questions regarding DM management. He also has a Theatre stage manager.   09/06/23 office note by Dr. Lanis is scanned under Media tab. After discussion at the multi-disciplinary Neuro-Oncology conference the consensus opinion was to proceed with maximal surgical resection. ASA was placed on hold. Would anticipate surgery is time sensitive given he likely has a symptomatic brain tumor; However, Dr. Valeda office did request preoperative cardiology risk assessment. Discussed with anesthesiologist Maryclare Cornet, MD and reached out to Dr. Verlin and/or covering APP for follow-up. Wyn Jackee Raddle, NP discussed with Dr. Verlin who felt it was okay with Mr. Cerveny proceeding with scheduled craniotomy (acceptable risk) without further cardiac testing completed at this time. Okay to hold ASA 5-7 days for surgery and resume when surgically safe.  Anesthesia team to evaluate on the day of surgery.   VS: BP 135/77   Pulse 81   Temp 36.4 C (Oral)   Resp 18   Ht 5' 9 (1.753 m)   Wt 99.3 kg   SpO2 94%   BMI 32.33 kg/m    PROVIDERS: [name], MD is PCP  Faythe Purchase, MD is endocrinologist Verlin Bruckner, MD is cardiologist   LABS: Most recent lab results in [facility] include: Lab Results  Component Value Date   WBC 5.1 08/22/2023   HGB 14.2 08/22/2023   HCT 42.3 08/22/2023   PLT 206 08/22/2023   GLUCOSE 149 (H) 08/22/2023   CHOL 102 08/19/2023   TRIG 91.0 08/19/2023   HDL 34.80 (L) 08/19/2023   LDLCALC 49 08/19/2023   ALT 19 08/22/2023   AST 21 08/22/2023   NA 141 08/22/2023   K 4.2 08/22/2023   CL 102 08/22/2023   CREATININE 0.74 08/22/2023   BUN 9 08/22/2023   CO2 30 08/22/2023   TSH 0.743 08/22/2023  A1c 7.6% on 08/11/23 at [facility]. T&S done on 09/10/23.    IMAGES: CT chest/abd/pelvis 09/02/23: IMPRESSION: - Evidence of chronic liver disease with a nodular fatty liver. No splenomegaly, ascites. Patent portal vein. - Small enhancing focus in the liver in segment 4. Based on appearance this could be a small hemangioma or other vascular lesion. Please correlate for any prior and confirmatory MRI could be considered as clinically appropriate. - No bowel obstruction.  Scattered stool. - Bilateral nonobstructing renal stones. - Scattered few punctate calcified lung nodules consistent with old granulomatous disease. - Stable enlargement and heterogeneity of the right thyroid  lobe. - No developing abnormal lymph node enlargement or other mass lesion at this time.  MRI Brain with/without contrast 09/02/23: FINDINGS: - Brain: An ovoid, circumscribed avidly enhancing mass is again demonstrated at the right temporal occipital junction, primarily within the posterior temporal lobe. The lesion measures approximately 5.6 cm AP diameter, 3.6 cm in transverse diameter and 3.5 cm in craniocaudad length. The lesion abuts the lateral wall of the atria of the right lateral ventricle. There is smooth enhancement of the ependyma within the temporal horn. There are multiple small enhancing nodules  within the right mesial temporal lobe on images 65-70. There is also a round satellite nodule present posteromedial to the dominant mass at the temporal occipital junction on image 84 of series 10. A nodular focus of enhancement is also present within the right thalamus on image 89 of series 10. There is increased FLAIR signal within the white matter surrounding the dominant mass. Focal areas of increased FLAIR signal are also noted within the pons. - Vascular: Normal flow voids. - Skull and upper cervical spine: Normal signal intensity. No bony lesions are evident. - Sinuses/Orbits: Status post bilateral lens replacement. Mild mucosal disease within the floor the left maxillary sinus. - Other: None. IMPRESSION:   1. In addition to the dominant mass previously noted at the right temporo-occipital junction, there are multiple additional nodular foci of enhancement within the right cerebral hemisphere and there is abnormal enhancement of the ependyma lining the temporal horn of the right lateral ventricle.  MRI Brain without contrast 08/30/23: IMPRESSION: - 5.5 cm mass in the right temporal occipital lobes with mild surrounding vasogenic edema and local mass effect. No midline shift. Differential includes primary CNS neoplasm and metastatic disease among other etiologies. Recommend contrast enhanced MRI for further evaluation. - Mild chronic microvascular ischemic changes and mild parenchymal  volume loss. - Focal susceptibility in the lateral right cerebellum which may reflect chronic microhemorrhage or small vascular malformation.    EKG: 08/22/23: Sinus rhythm with premature supraventricular complexes Right bundle branch block (old)   CV: Echo 05/13/21: IMPRESSIONS   1. Left ventricular ejection fraction, by estimation, is 60 to 65%. The  left ventricle has normal function. The left ventricle has no regional  wall motion abnormalities. There is mild left ventricular  hypertrophy.  Left ventricular diastolic parameters  were normal. The average left ventricular global longitudinal strain is  -24.5 %. The global longitudinal strain is normal.   2. Right ventricular systolic function is normal. The right ventricular  size is normal. Tricuspid regurgitation signal is inadequate for assessing  PA pressure.   3. No evidence of mitral valve regurgitation.   4. The aortic valve is grossly normal. Aortic valve regurgitation is not  visualized.   5. The inferior vena cava is normal in size with greater than 50%  respiratory variability, suggesting right atrial pressure of 3 mmHg.  - Comparison(s): 01/27/16 EF 60-65%.  Conclusion(s)/Recommendation(s): Normal biventricular function without  evidence of hemodynamically significant valvular heart disease. Study form  2017 not available.      US  Abd Aorta 12/27/19: Summary:  Abdominal Aorta: No evidence of an abdominal aortic aneurysm was  visualized.  Stenosis:  Atherosclerosis noted throughout aorta and iliacs. No evidence of stenosis  seen.  IVC/Iliac: There is no evidence of thrombus involving the IVC.       Cardiac cath 06/14/13 (following mildly abnormal stress test): Left main: No obstructive disease.  Left Anterior Descending Artery: Moderate to large caliber vessel that courses to the apex. The proximal stent is patent with no restenosis. The mid and distal vessel has no obstructive disease. There are two small caliber diagonal branches with no obstructive disease.  Circumflex Artery: Large caliber vessel with early intermediate branch followed by two moderate sized OM branches. No obstructive disease noted.  Right Coronary Artery: Small non-dominant vessel with no obstructive disease noted.  Left Ventricular Angiogram: LVEF 50%.  Impression:  1. Single vessel CAD with patent mid LAD stent  2. Normal LV function     Holter monitor 09/10/10: SR. Rare PVCs/PACs, no VT or afib.   Past Medical  History:  Diagnosis Date   Allergic rhinitis    Arthritis    ASTHMA 06/08/2008   Asthma    as a child   CAD (coronary artery disease)    a. s/p Promus DES (3.0 x 16 mm) to mid LAD 05/16/10;  b. Myoview  05/15/10: anteroseptal ischemia with EF 52%;   c. Cath 05/16/10: single vessel CAD with mLAD 90% tx with PCI and EF 50%;  d. 07/2011 Cath:  Patent stent, nonobs dzs, NL EF.   Chronic kidney disease    was seen by Dr. Lonna and released.   Colonic polyp    DIABETES MELLITUS, TYPE II 06/08/2008   GERD (gastroesophageal reflux disease)    Hemorrhoids    HYPERLIPIDEMIA 06/08/2008   HYPERTENSION 06/08/2008   Impotence of organic origin 08/24/2008   Myocardial infarction (HCC) 04/16/2010   Pneumonia    hx of   Renal vein thrombosis (HCC)    Seasonal allergies     Past Surgical History:  Procedure Laterality Date   BACK SURGERY  2011   CATARACT EXTRACTION Bilateral    2024   CORONARY ANGIOPLASTY WITH STENT PLACEMENT     LEFT HEART CATHETERIZATION WITH CORONARY ANGIOGRAM N/A 07/29/2011  Procedure: LEFT HEART CATHETERIZATION WITH CORONARY ANGIOGRAM;  Surgeon: Lonni JONETTA Cash, MD;  Location: [facility] CATH LAB;  Service: Cardiovascular;  Laterality: N/A;   LEFT HEART CATHETERIZATION WITH CORONARY ANGIOGRAM N/A 06/14/2013   Procedure: LEFT HEART CATHETERIZATION WITH CORONARY ANGIOGRAM;  Surgeon: Lonni JONETTA Cash, MD;  Location: [facility] CATH LAB;  Service: Cardiovascular;  Laterality: N/A;   RADIOLOGY WITH ANESTHESIA N/A 06/09/2022   Procedure: MRI WITH ANESTHESIA OF LUMBAR SPINE WITHOUT CONTRAST;  Surgeon: Radiologist, Medication, MD;  Location: [facility];  Service: Radiology;  Laterality: N/A;    MEDICATIONS:  albuterol  (VENTOLIN  HFA) 108 (90 Base) MCG/ACT inhaler   amLODipine  (NORVASC ) 10 MG tablet   Ascorbic Acid (VITAMIN C) 1000 MG tablet   aspirin  81 MG tablet   B-D ULTRAFINE III SHORT PEN 31G X 8 MM MISC   BIOTIN PO   Continuous Glucose Sensor (FREESTYLE LIBRE 3 PLUS SENSOR) MISC    glucagon 1 MG injection   Insulin  Disposable Pump (OMNIPOD DASH PODS, GEN 4,) MISC   insulin  glargine (LANTUS SOLOSTAR) 100 UNIT/ML Solostar Pen   Insulin  Human (INSULIN  PUMP) 100 unit/ml SOLN   lisinopril  (ZESTRIL ) 40 MG tablet   metFORMIN  (GLUCOPHAGE -XR) 500 MG 24 hr tablet   metoprolol  succinate (TOPROL -XL) 50 MG 24 hr tablet   Multiple Vitamins-Minerals (PRESERVISION AREDS 2 PO)   NOVOLIN R RELION 100 UNIT/ML injection   Potassium 99 MG TABS   SEMAGLUTIDE, 2 MG/DOSE, [place]   simvastatin  (ZOCOR ) 20 MG tablet   VITAMIN D PO   vitamin E 400 UNIT capsule   zinc sulfate, 50mg  elemental zinc, 220 (50 Zn) MG capsule   No current facility-administered medications for this encounter.    Isaiah Ruder, PA-C Surgical Short Stay/Anesthesiology [facility] Phone [phone] [facility] Phone [phone] 09/13/2023 2:02 PM

## 2023-09-14 ENCOUNTER — Encounter (HOSPITAL_COMMUNITY): Payer: Self-pay

## 2023-09-14 ENCOUNTER — Other Ambulatory Visit: Payer: Self-pay

## 2023-09-14 ENCOUNTER — Encounter (HOSPITAL_COMMUNITY): Payer: Self-pay | Admitting: Neurosurgery

## 2023-09-14 ENCOUNTER — Encounter (HOSPITAL_COMMUNITY)
Admission: RE | Disposition: A | Discharge status: Rehabilitation Facility | Payer: Self-pay | Source: Home / Self Care | Attending: Neurosurgery

## 2023-09-14 ENCOUNTER — Inpatient Hospital Stay (HOSPITAL_COMMUNITY)
Admission: RE | Admit: 2023-09-14 | Discharge: 2023-09-17 | DRG: 027 | Disposition: A | Discharge status: Rehabilitation Facility | Attending: Neurosurgery | Admitting: Neurosurgery

## 2023-09-14 ENCOUNTER — Inpatient Hospital Stay (HOSPITAL_COMMUNITY): Payer: Self-pay

## 2023-09-14 DIAGNOSIS — Z794 Long term (current) use of insulin: Secondary | ICD-10-CM | POA: Diagnosis not present

## 2023-09-14 DIAGNOSIS — Z79899 Other long term (current) drug therapy: Secondary | ICD-10-CM

## 2023-09-14 DIAGNOSIS — Z7984 Long term (current) use of oral hypoglycemic drugs: Secondary | ICD-10-CM

## 2023-09-14 DIAGNOSIS — Z9889 Other specified postprocedural states: Principal | ICD-10-CM

## 2023-09-14 DIAGNOSIS — E1122 Type 2 diabetes mellitus with diabetic chronic kidney disease: Secondary | ICD-10-CM | POA: Diagnosis present

## 2023-09-14 DIAGNOSIS — I2510 Atherosclerotic heart disease of native coronary artery without angina pectoris: Secondary | ICD-10-CM | POA: Diagnosis not present

## 2023-09-14 DIAGNOSIS — Z86718 Personal history of other venous thrombosis and embolism: Secondary | ICD-10-CM

## 2023-09-14 DIAGNOSIS — Z87891 Personal history of nicotine dependence: Secondary | ICD-10-CM | POA: Diagnosis not present

## 2023-09-14 DIAGNOSIS — Z7985 Long-term (current) use of injectable non-insulin antidiabetic drugs: Secondary | ICD-10-CM

## 2023-09-14 DIAGNOSIS — Z8249 Family history of ischemic heart disease and other diseases of the circulatory system: Secondary | ICD-10-CM

## 2023-09-14 DIAGNOSIS — Z888 Allergy status to other drugs, medicaments and biological substances status: Secondary | ICD-10-CM | POA: Diagnosis not present

## 2023-09-14 DIAGNOSIS — R5381 Other malaise: Secondary | ICD-10-CM | POA: Diagnosis not present

## 2023-09-14 DIAGNOSIS — I10 Essential (primary) hypertension: Secondary | ICD-10-CM

## 2023-09-14 DIAGNOSIS — G9389 Other specified disorders of brain: Secondary | ICD-10-CM | POA: Diagnosis not present

## 2023-09-14 DIAGNOSIS — J4520 Mild intermittent asthma, uncomplicated: Secondary | ICD-10-CM | POA: Diagnosis present

## 2023-09-14 DIAGNOSIS — Z833 Family history of diabetes mellitus: Secondary | ICD-10-CM | POA: Diagnosis not present

## 2023-09-14 DIAGNOSIS — T380X5A Adverse effect of glucocorticoids and synthetic analogues, initial encounter: Secondary | ICD-10-CM | POA: Diagnosis not present

## 2023-09-14 DIAGNOSIS — N189 Chronic kidney disease, unspecified: Secondary | ICD-10-CM | POA: Diagnosis present

## 2023-09-14 DIAGNOSIS — Z6832 Body mass index (BMI) 32.0-32.9, adult: Secondary | ICD-10-CM

## 2023-09-14 DIAGNOSIS — I252 Old myocardial infarction: Secondary | ICD-10-CM

## 2023-09-14 DIAGNOSIS — E88819 Insulin resistance, unspecified: Secondary | ICD-10-CM | POA: Diagnosis present

## 2023-09-14 DIAGNOSIS — C719 Malignant neoplasm of brain, unspecified: Secondary | ICD-10-CM | POA: Diagnosis present

## 2023-09-14 DIAGNOSIS — E1165 Type 2 diabetes mellitus with hyperglycemia: Secondary | ICD-10-CM | POA: Diagnosis present

## 2023-09-14 DIAGNOSIS — D496 Neoplasm of unspecified behavior of brain: Secondary | ICD-10-CM | POA: Diagnosis present

## 2023-09-14 DIAGNOSIS — Z951 Presence of aortocoronary bypass graft: Secondary | ICD-10-CM

## 2023-09-14 DIAGNOSIS — E66811 Obesity, class 1: Secondary | ICD-10-CM | POA: Diagnosis present

## 2023-09-14 DIAGNOSIS — Z9641 Presence of insulin pump (external) (internal): Secondary | ICD-10-CM | POA: Diagnosis present

## 2023-09-14 DIAGNOSIS — D62 Acute posthemorrhagic anemia: Secondary | ICD-10-CM | POA: Diagnosis not present

## 2023-09-14 DIAGNOSIS — R739 Hyperglycemia, unspecified: Secondary | ICD-10-CM | POA: Diagnosis not present

## 2023-09-14 DIAGNOSIS — R112 Nausea with vomiting, unspecified: Secondary | ICD-10-CM | POA: Diagnosis not present

## 2023-09-14 DIAGNOSIS — I129 Hypertensive chronic kidney disease with stage 1 through stage 4 chronic kidney disease, or unspecified chronic kidney disease: Secondary | ICD-10-CM | POA: Diagnosis present

## 2023-09-14 DIAGNOSIS — Z955 Presence of coronary angioplasty implant and graft: Secondary | ICD-10-CM | POA: Diagnosis not present

## 2023-09-14 DIAGNOSIS — Z860100 Personal history of colon polyps, unspecified: Secondary | ICD-10-CM

## 2023-09-14 DIAGNOSIS — Z7982 Long term (current) use of aspirin: Secondary | ICD-10-CM | POA: Diagnosis not present

## 2023-09-14 DIAGNOSIS — R410 Disorientation, unspecified: Secondary | ICD-10-CM | POA: Diagnosis present

## 2023-09-14 DIAGNOSIS — K5901 Slow transit constipation: Secondary | ICD-10-CM | POA: Diagnosis not present

## 2023-09-14 DIAGNOSIS — C712 Malignant neoplasm of temporal lobe: Secondary | ICD-10-CM | POA: Diagnosis not present

## 2023-09-14 DIAGNOSIS — E119 Type 2 diabetes mellitus without complications: Secondary | ICD-10-CM | POA: Diagnosis not present

## 2023-09-14 DIAGNOSIS — E785 Hyperlipidemia, unspecified: Secondary | ICD-10-CM | POA: Diagnosis not present

## 2023-09-14 DIAGNOSIS — K219 Gastro-esophageal reflux disease without esophagitis: Secondary | ICD-10-CM | POA: Diagnosis not present

## 2023-09-14 DIAGNOSIS — Z820 Family history of epilepsy and other diseases of the nervous system: Secondary | ICD-10-CM

## 2023-09-14 DIAGNOSIS — E876 Hypokalemia: Secondary | ICD-10-CM | POA: Diagnosis not present

## 2023-09-14 DIAGNOSIS — G4489 Other headache syndrome: Secondary | ICD-10-CM | POA: Diagnosis not present

## 2023-09-14 DIAGNOSIS — J45909 Unspecified asthma, uncomplicated: Secondary | ICD-10-CM | POA: Diagnosis not present

## 2023-09-14 HISTORY — DX: Other specified postprocedural states: Z98.890

## 2023-09-14 HISTORY — PX: CRANIOTOMY: SHX93

## 2023-09-14 HISTORY — PX: APPLICATION OF CRANIAL NAVIGATION: SHX6578

## 2023-09-14 LAB — POCT I-STAT 7, (LYTES, BLD GAS, ICA,H+H)
Acid-Base Excess: 0 mmol/L (ref 0.0–2.0)
Acid-base deficit: 1 mmol/L (ref 0.0–2.0)
Bicarbonate: 25.7 mmol/L (ref 20.0–28.0)
Bicarbonate: 24 mmol/L (ref 20.0–28.0)
Calcium, Ion: 1.23 mmol/L (ref 1.15–1.40)
Calcium, Ion: 1.2 mmol/L (ref 1.15–1.40)
HCT: 35 % — ABNORMAL LOW (ref 39.0–52.0)
HCT: 36 % — ABNORMAL LOW (ref 39.0–52.0)
Hemoglobin: 11.9 g/dL — ABNORMAL LOW (ref 13.0–17.0)
Hemoglobin: 12.2 g/dL — ABNORMAL LOW (ref 13.0–17.0)
O2 Saturation: 99 %
O2 Saturation: 99 %
Potassium: 4 mmol/L (ref 3.5–5.1)
Potassium: 3.9 mmol/L (ref 3.5–5.1)
Sodium: 144 mmol/L (ref 135–145)
Sodium: 143 mmol/L (ref 135–145)
TCO2: 27 mmol/L (ref 22–32)
TCO2: 25 mmol/L (ref 22–32)
pCO2 arterial: 47.7 mmHg (ref 32–48)
pCO2 arterial: 39.4 mmHg (ref 32–48)
pH, Arterial: 7.34 — ABNORMAL LOW (ref 7.35–7.45)
pH, Arterial: 7.393 (ref 7.35–7.45)
pO2, Arterial: 155 mmHg — ABNORMAL HIGH (ref 83–108)
pO2, Arterial: 145 mmHg — ABNORMAL HIGH (ref 83–108)

## 2023-09-14 LAB — MRSA NEXT GEN BY PCR, NASAL: MRSA by PCR Next Gen: NOT DETECTED

## 2023-09-14 LAB — ABO/RH: ABO/RH(D): A POS

## 2023-09-14 LAB — GLUCOSE, CAPILLARY
Glucose-Capillary: 269 mg/dL — ABNORMAL HIGH (ref 70–99)
Glucose-Capillary: 186 mg/dL — ABNORMAL HIGH (ref 70–99)
Glucose-Capillary: 186 mg/dL — ABNORMAL HIGH (ref 70–99)
Glucose-Capillary: 225 mg/dL — ABNORMAL HIGH (ref 70–99)
Glucose-Capillary: 241 mg/dL — ABNORMAL HIGH (ref 70–99)
Glucose-Capillary: 203 mg/dL — ABNORMAL HIGH (ref 70–99)
Glucose-Capillary: 221 mg/dL — ABNORMAL HIGH (ref 70–99)

## 2023-09-14 LAB — HEMOGLOBIN A1C
Hgb A1c MFr Bld: 7.4 % — ABNORMAL HIGH (ref 4.8–5.6)
Mean Plasma Glucose: 165.68 mg/dL

## 2023-09-14 SURGERY — CRANIOTOMY TUMOR EXCISION
Anesthesia: General | Site: Head | Laterality: Right

## 2023-09-14 MED ORDER — SODIUM CHLORIDE 0.9 % IV SOLN: INTRAVENOUS | Status: DC

## 2023-09-14 MED ORDER — INSULIN GLARGINE-YFGN 100 UNIT/ML ~~LOC~~ SOLN: 80.0000 [IU] | Freq: Every day | SUBCUTANEOUS | Status: DC

## 2023-09-14 MED ORDER — THROMBIN 20000 UNITS EX SOLR
CUTANEOUS | Status: AC
Start: 2023-09-14 — End: 2023-09-14
  Filled 2023-09-14: qty 20000

## 2023-09-14 MED ORDER — LABETALOL HCL 5 MG/ML IV SOLN
INTRAVENOUS | Status: DC | PRN
  Administered 2023-09-14 (×2): 5 mg via INTRAVENOUS

## 2023-09-14 MED ORDER — LIDOCAINE-EPINEPHRINE 1 %-1:100000 IJ SOLN
INTRAMUSCULAR | Status: DC | PRN
  Administered 2023-09-14: 8 mL

## 2023-09-14 MED ORDER — CEFAZOLIN SODIUM-DEXTROSE 2-4 GM/100ML-% IV SOLN
2.0000 g | INTRAVENOUS | Status: AC
  Administered 2023-09-14 (×2): 2 g via INTRAVENOUS

## 2023-09-14 MED ORDER — CLEVIDIPINE BUTYRATE 0.5 MG/ML IV EMUL
0.0000 mg/h | INTRAVENOUS | Status: DC
  Administered 2023-09-14: 2 mg/h via INTRAVENOUS

## 2023-09-14 MED ORDER — PHENYLEPHRINE 80 MCG/ML (10ML) SYRINGE FOR IV PUSH (FOR BLOOD PRESSURE SUPPORT)
PREFILLED_SYRINGE | INTRAVENOUS | Status: DC | PRN
  Administered 2023-09-14: 160 ug via INTRAVENOUS

## 2023-09-14 MED ORDER — CHLORHEXIDINE GLUCONATE CLOTH 2 % EX PADS: 6.0000 | MEDICATED_PAD | Freq: Once | CUTANEOUS | Status: DC

## 2023-09-14 MED ORDER — METOPROLOL SUCCINATE ER 50 MG PO TB24
50.0000 mg | ORAL_TABLET | Freq: Every day | ORAL | Status: DC
  Administered 2023-09-15 – 2023-09-17 (×3): 50 mg via ORAL
  Filled 2023-09-14 (×3): qty 1

## 2023-09-14 MED ORDER — CHLORHEXIDINE GLUCONATE 0.12 % MT SOLN
OROMUCOSAL | Status: AC
  Administered 2023-09-14: 15 mL via OROMUCOSAL
  Filled 2023-09-14: qty 15

## 2023-09-14 MED ORDER — MORPHINE SULFATE (PF) 2 MG/ML IV SOLN
1.0000 mg | INTRAVENOUS | Status: DC | PRN
  Administered 2023-09-14 – 2023-09-15 (×4): 2 mg via INTRAVENOUS
  Filled 2023-09-14 (×4): qty 1

## 2023-09-14 MED ORDER — ACETAMINOPHEN 650 MG RE SUPP: 650.0000 mg | RECTAL | Status: DC | PRN

## 2023-09-14 MED ORDER — INSULIN GLARGINE-YFGN 100 UNIT/ML ~~LOC~~ SOLN
35.0000 [IU] | Freq: Two times a day (BID) | SUBCUTANEOUS | Status: DC
  Filled 2023-09-14 (×2): qty 0.35

## 2023-09-14 MED ORDER — METFORMIN HCL ER 500 MG PO TB24
1000.0000 mg | ORAL_TABLET | Freq: Two times a day (BID) | ORAL | Status: DC
  Filled 2023-09-14: qty 2

## 2023-09-14 MED ORDER — BACITRACIN ZINC 500 UNIT/GM EX OINT
TOPICAL_OINTMENT | CUTANEOUS | Status: AC
  Filled 2023-09-14: qty 28.35

## 2023-09-14 MED ORDER — CLEVIDIPINE BUTYRATE 0.5 MG/ML IV EMUL
INTRAVENOUS | Status: AC
  Filled 2023-09-14: qty 100

## 2023-09-14 MED ORDER — SENNOSIDES-DOCUSATE SODIUM 8.6-50 MG PO TABS: 1.0000 | ORAL_TABLET | Freq: Every evening | ORAL | Status: DC | PRN

## 2023-09-14 MED ORDER — PANTOPRAZOLE SODIUM 40 MG PO TBEC
40.0000 mg | DELAYED_RELEASE_TABLET | Freq: Every day | ORAL | Status: DC
  Administered 2023-09-14 – 2023-09-16 (×3): 40 mg via ORAL
  Filled 2023-09-14 (×3): qty 1

## 2023-09-14 MED ORDER — ROCURONIUM BROMIDE 10 MG/ML (PF) SYRINGE
PREFILLED_SYRINGE | INTRAVENOUS | Status: DC | PRN
  Administered 2023-09-14: 10 mg via INTRAVENOUS
  Administered 2023-09-14: 20 mg via INTRAVENOUS
  Administered 2023-09-14: 30 mg via INTRAVENOUS
  Administered 2023-09-14: 100 mg via INTRAVENOUS

## 2023-09-14 MED ORDER — LIDOCAINE 2% (20 MG/ML) 5 ML SYRINGE
INTRAMUSCULAR | Status: DC | PRN
  Administered 2023-09-14: 70 mg via INTRAVENOUS

## 2023-09-14 MED ORDER — LEVETIRACETAM 500 MG/5ML IV SOLN
INTRAVENOUS | Status: AC
  Filled 2023-09-14: qty 10

## 2023-09-14 MED ORDER — SEMAGLUTIDE (2 MG/DOSE) 8 MG/3ML ~~LOC~~ SOPN: 2.0000 mg | PEN_INJECTOR | SUBCUTANEOUS | Status: DC

## 2023-09-14 MED ORDER — PHENYLEPHRINE HCL-NACL 20-0.9 MG/250ML-% IV SOLN
INTRAVENOUS | Status: DC | PRN
  Administered 2023-09-14: 40 ug/min via INTRAVENOUS

## 2023-09-14 MED ORDER — THROMBIN 5000 UNITS EX KIT
PACK | CUTANEOUS | Status: AC
  Filled 2023-09-14: qty 1

## 2023-09-14 MED ORDER — CEFAZOLIN SODIUM-DEXTROSE 2-4 GM/100ML-% IV SOLN
INTRAVENOUS | Status: AC
  Filled 2023-09-14: qty 100

## 2023-09-14 MED ORDER — DEXAMETHASONE 4 MG PO TABS
4.0000 mg | ORAL_TABLET | Freq: Three times a day (TID) | ORAL | Status: DC
Start: 2023-09-16 — End: 2023-09-15

## 2023-09-14 MED ORDER — ACETAMINOPHEN 10 MG/ML IV SOLN
1000.0000 mg | Freq: Once | INTRAVENOUS | Status: DC | PRN
Start: 2023-09-14 — End: 2023-09-14

## 2023-09-14 MED ORDER — THROMBIN 20000 UNITS EX SOLR
CUTANEOUS | Status: DC | PRN
  Administered 2023-09-14 (×2): 20 mL via TOPICAL

## 2023-09-14 MED ORDER — ORAL CARE MOUTH RINSE
15.0000 mL | OROMUCOSAL | Status: DC | PRN
Start: 2023-09-14 — End: 2023-09-17

## 2023-09-14 MED ORDER — BUPIVACAINE HCL (PF) 0.5 % IJ SOLN
INTRAMUSCULAR | Status: AC
  Filled 2023-09-14: qty 30

## 2023-09-14 MED ORDER — LABETALOL HCL 5 MG/ML IV SOLN
10.0000 mg | INTRAVENOUS | Status: DC | PRN
  Administered 2023-09-14 – 2023-09-15 (×5): 20 mg via INTRAVENOUS
  Filled 2023-09-14 (×5): qty 4

## 2023-09-14 MED ORDER — DEXAMETHASONE 4 MG PO TABS: 4.0000 mg | ORAL_TABLET | Freq: Four times a day (QID) | ORAL | Status: DC

## 2023-09-14 MED ORDER — OXYCODONE HCL 5 MG/5ML PO SOLN: 5.0000 mg | Freq: Once | ORAL | Status: DC | PRN

## 2023-09-14 MED ORDER — LEVETIRACETAM (KEPPRA) 500 MG/5 ML ADULT IV PUSH
500.0000 mg | Freq: Two times a day (BID) | INTRAVENOUS | Status: DC
  Administered 2023-09-14 – 2023-09-16 (×4): 500 mg via INTRAVENOUS
  Filled 2023-09-14 (×5): qty 5

## 2023-09-14 MED ORDER — DEXAMETHASONE 4 MG PO TABS
6.0000 mg | ORAL_TABLET | Freq: Four times a day (QID) | ORAL | Status: AC
  Administered 2023-09-14 – 2023-09-15 (×4): 6 mg via ORAL
  Filled 2023-09-14 (×4): qty 2

## 2023-09-14 MED ORDER — BUPIVACAINE HCL (PF) 0.5 % IJ SOLN
INTRAMUSCULAR | Status: DC | PRN
  Administered 2023-09-14: 8 mL

## 2023-09-14 MED ORDER — LIDOCAINE 2% (20 MG/ML) 5 ML SYRINGE
INTRAMUSCULAR | Status: AC
  Filled 2023-09-14: qty 5

## 2023-09-14 MED ORDER — ROCURONIUM BROMIDE 10 MG/ML (PF) SYRINGE
PREFILLED_SYRINGE | INTRAVENOUS | Status: AC
  Filled 2023-09-14: qty 10

## 2023-09-14 MED ORDER — SODIUM CHLORIDE 0.9 % IV SOLN: INTRAVENOUS | Status: DC | PRN

## 2023-09-14 MED ORDER — INSULIN ASPART 100 UNIT/ML IJ SOLN
0.0000 [IU] | INTRAMUSCULAR | Status: DC
  Administered 2023-09-14 (×3): 7 [IU] via SUBCUTANEOUS
  Administered 2023-09-15: 4 [IU] via SUBCUTANEOUS
  Administered 2023-09-15 (×2): 7 [IU] via SUBCUTANEOUS

## 2023-09-14 MED ORDER — FENTANYL CITRATE (PF) 250 MCG/5ML IJ SOLN
INTRAMUSCULAR | Status: DC | PRN
  Administered 2023-09-14: 100 ug via INTRAVENOUS
  Administered 2023-09-14: 50 ug via INTRAVENOUS

## 2023-09-14 MED ORDER — ORAL CARE MOUTH RINSE
15.0000 mL | Freq: Once | OROMUCOSAL | Status: AC
Start: 2023-09-14 — End: 2023-09-14

## 2023-09-14 MED ORDER — HYDROCODONE-ACETAMINOPHEN 5-325 MG PO TABS
1.0000 | ORAL_TABLET | ORAL | Status: DC | PRN
  Administered 2023-09-14 – 2023-09-15 (×3): 1 via ORAL
  Filled 2023-09-14 (×3): qty 1

## 2023-09-14 MED ORDER — INSULIN GLARGINE-YFGN 100 UNIT/ML ~~LOC~~ SOLN
30.0000 [IU] | Freq: Once | SUBCUTANEOUS | Status: AC
  Administered 2023-09-14: 30 [IU] via SUBCUTANEOUS
  Filled 2023-09-14: qty 0.3

## 2023-09-14 MED ORDER — 0.9 % SODIUM CHLORIDE (POUR BTL) OPTIME
TOPICAL | Status: DC | PRN
Start: 2023-09-14 — End: 2023-09-14
  Administered 2023-09-14: 2000 mL

## 2023-09-14 MED ORDER — ONDANSETRON HCL 4 MG/2ML IJ SOLN
INTRAMUSCULAR | Status: AC
  Filled 2023-09-14: qty 2

## 2023-09-14 MED ORDER — CHLORHEXIDINE GLUCONATE CLOTH 2 % EX PADS
6.0000 | MEDICATED_PAD | Freq: Every day | CUTANEOUS | Status: DC
  Administered 2023-09-14 – 2023-09-17 (×4): 6 via TOPICAL

## 2023-09-14 MED ORDER — FENTANYL CITRATE (PF) 100 MCG/2ML IJ SOLN: 25.0000 ug | INTRAMUSCULAR | Status: DC | PRN

## 2023-09-14 MED ORDER — INSULIN ASPART 100 UNIT/ML IJ SOLN
4.0000 [IU] | Freq: Three times a day (TID) | INTRAMUSCULAR | Status: DC
  Administered 2023-09-14 – 2023-09-16 (×3): 4 [IU] via SUBCUTANEOUS

## 2023-09-14 MED ORDER — PROPOFOL 10 MG/ML IV BOLUS
INTRAVENOUS | Status: DC | PRN
  Administered 2023-09-14: 160 mg via INTRAVENOUS

## 2023-09-14 MED ORDER — AMLODIPINE BESYLATE 5 MG PO TABS
10.0000 mg | ORAL_TABLET | Freq: Every day | ORAL | Status: DC
  Administered 2023-09-14 – 2023-09-16 (×3): 10 mg via ORAL
  Filled 2023-09-14: qty 1
  Filled 2023-09-14: qty 2
  Filled 2023-09-14: qty 1

## 2023-09-14 MED ORDER — PHENYLEPHRINE 80 MCG/ML (10ML) SYRINGE FOR IV PUSH (FOR BLOOD PRESSURE SUPPORT)
PREFILLED_SYRINGE | INTRAVENOUS | Status: AC
  Filled 2023-09-14: qty 10

## 2023-09-14 MED ORDER — PROPOFOL 10 MG/ML IV BOLUS
INTRAVENOUS | Status: AC
Start: 2023-09-14 — End: 2023-09-14
  Filled 2023-09-14: qty 20

## 2023-09-14 MED ORDER — THROMBIN 5000 UNITS EX SOLR
OROMUCOSAL | Status: DC | PRN
  Administered 2023-09-14: 5 mL via TOPICAL

## 2023-09-14 MED ORDER — ACETAMINOPHEN 325 MG PO TABS
650.0000 mg | ORAL_TABLET | ORAL | Status: DC | PRN
  Administered 2023-09-14 – 2023-09-17 (×11): 650 mg via ORAL
  Filled 2023-09-14 (×11): qty 2

## 2023-09-14 MED ORDER — SIMVASTATIN 20 MG PO TABS
20.0000 mg | ORAL_TABLET | Freq: Every day | ORAL | Status: DC
  Administered 2023-09-14 – 2023-09-17 (×4): 20 mg via ORAL
  Filled 2023-09-14 (×4): qty 1

## 2023-09-14 MED ORDER — PANTOPRAZOLE SODIUM 40 MG IV SOLR: 40.0000 mg | Freq: Every day | INTRAVENOUS | Status: DC

## 2023-09-14 MED ORDER — PROMETHAZINE HCL 25 MG PO TABS
12.5000 mg | ORAL_TABLET | ORAL | Status: DC | PRN
  Administered 2023-09-14: 25 mg via ORAL
  Filled 2023-09-14: qty 1

## 2023-09-14 MED ORDER — ALBUTEROL SULFATE (2.5 MG/3ML) 0.083% IN NEBU: 3.0000 mL | INHALATION_SOLUTION | Freq: Four times a day (QID) | RESPIRATORY_TRACT | Status: DC | PRN

## 2023-09-14 MED ORDER — ONDANSETRON HCL 4 MG/2ML IJ SOLN
INTRAMUSCULAR | Status: DC | PRN
  Administered 2023-09-14: 4 mg via INTRAVENOUS

## 2023-09-14 MED ORDER — PROSIGHT PO TABS
1.0000 | ORAL_TABLET | Freq: Every day | ORAL | Status: DC
  Administered 2023-09-14 – 2023-09-16 (×3): 1 via ORAL
  Filled 2023-09-14 (×3): qty 1

## 2023-09-14 MED ORDER — THROMBIN 20000 UNITS EX SOLR
CUTANEOUS | Status: AC
  Filled 2023-09-14: qty 20000

## 2023-09-14 MED ORDER — LISINOPRIL 20 MG PO TABS
40.0000 mg | ORAL_TABLET | Freq: Every day | ORAL | Status: DC
  Administered 2023-09-14 – 2023-09-16 (×3): 40 mg via ORAL
  Filled 2023-09-14 (×3): qty 2

## 2023-09-14 MED ORDER — ONDANSETRON HCL 4 MG/2ML IJ SOLN
4.0000 mg | INTRAMUSCULAR | Status: DC | PRN
  Administered 2023-09-14 (×2): 4 mg via INTRAVENOUS
  Filled 2023-09-14 (×2): qty 2

## 2023-09-14 MED ORDER — DEXAMETHASONE SODIUM PHOSPHATE 10 MG/ML IJ SOLN
INTRAMUSCULAR | Status: DC | PRN
  Administered 2023-09-14: 5 mg via INTRAVENOUS

## 2023-09-14 MED ORDER — LEVETIRACETAM (KEPPRA) 500 MG/5 ML ADULT IV PUSH
1000.0000 mg | Freq: Once | INTRAVENOUS | Status: AC
  Administered 2023-09-14: 1000 mg via INTRAVENOUS

## 2023-09-14 MED ORDER — FENTANYL CITRATE (PF) 250 MCG/5ML IJ SOLN
INTRAMUSCULAR | Status: AC
  Filled 2023-09-14: qty 5

## 2023-09-14 MED ORDER — PROPOFOL 10 MG/ML IV BOLUS
INTRAVENOUS | Status: AC
  Filled 2023-09-14: qty 20

## 2023-09-14 MED ORDER — SUGAMMADEX SODIUM 200 MG/2ML IV SOLN
INTRAVENOUS | Status: DC | PRN
  Administered 2023-09-14: 200 mg via INTRAVENOUS

## 2023-09-14 MED ORDER — CLEVIDIPINE BUTYRATE 0.5 MG/ML IV EMUL
INTRAVENOUS | Status: AC
  Filled 2023-09-14: qty 50

## 2023-09-14 MED ORDER — SODIUM CHLORIDE 0.9 % IV SOLN
0.1500 ug/kg/min | INTRAVENOUS | Status: DC
  Administered 2023-09-14: .05 ug/kg/min via INTRAVENOUS
  Filled 2023-09-14: qty 2000

## 2023-09-14 MED ORDER — CLEVIDIPINE BUTYRATE 0.5 MG/ML IV EMUL
0.0000 mg/h | INTRAVENOUS | Status: DC
  Administered 2023-09-14 (×2): 16 mg/h via INTRAVENOUS
  Administered 2023-09-14: 14 mg/h via INTRAVENOUS
  Administered 2023-09-14: 20 mg/h via INTRAVENOUS
  Administered 2023-09-14: 17 mg/h via INTRAVENOUS
  Administered 2023-09-15: 7 mg/h via INTRAVENOUS
  Administered 2023-09-15: 16 mg/h via INTRAVENOUS
  Administered 2023-09-15: 8 mg/h via INTRAVENOUS
  Administered 2023-09-15: 10 mg/h via INTRAVENOUS
  Filled 2023-09-14 (×5): qty 50
  Filled 2023-09-14: qty 100
  Filled 2023-09-14 (×3): qty 50

## 2023-09-14 MED ORDER — CHLORHEXIDINE GLUCONATE 0.12 % MT SOLN: 15.0000 mL | Freq: Once | OROMUCOSAL | Status: AC

## 2023-09-14 MED ORDER — POTASSIUM 99 MG PO TABS: 99.0000 mg | ORAL_TABLET | Freq: Every evening | ORAL | Status: DC

## 2023-09-14 MED ORDER — OXYCODONE HCL 5 MG PO TABS: 5.0000 mg | ORAL_TABLET | Freq: Once | ORAL | Status: DC | PRN

## 2023-09-14 MED ORDER — CEFAZOLIN SODIUM 1 G IJ SOLR
INTRAMUSCULAR | Status: AC
  Filled 2023-09-14: qty 20

## 2023-09-14 MED ORDER — LIDOCAINE-EPINEPHRINE 1 %-1:100000 IJ SOLN
INTRAMUSCULAR | Status: AC
Start: 2023-09-14 — End: 2023-09-14
  Filled 2023-09-14: qty 1

## 2023-09-14 MED ORDER — SODIUM CHLORIDE 0.9 % IV SOLN
0.1500 ug/kg/min | INTRAVENOUS | Status: DC
  Filled 2023-09-14: qty 2000

## 2023-09-14 MED ORDER — BISACODYL 10 MG RE SUPP: 10.0000 mg | Freq: Every day | RECTAL | Status: DC | PRN

## 2023-09-14 MED ORDER — DEXAMETHASONE SODIUM PHOSPHATE 10 MG/ML IJ SOLN
INTRAMUSCULAR | Status: AC
  Filled 2023-09-14: qty 1

## 2023-09-14 MED ORDER — CEFAZOLIN SODIUM-DEXTROSE 2-4 GM/100ML-% IV SOLN
2.0000 g | Freq: Three times a day (TID) | INTRAVENOUS | Status: AC
  Administered 2023-09-14 – 2023-09-15 (×3): 2 g via INTRAVENOUS
  Filled 2023-09-14 (×3): qty 100

## 2023-09-14 MED ORDER — BACITRACIN ZINC 500 UNIT/GM EX OINT
TOPICAL_OINTMENT | CUTANEOUS | Status: DC | PRN
  Administered 2023-09-14: 1 via TOPICAL

## 2023-09-14 MED ORDER — INSULIN ASPART 100 UNIT/ML IJ SOLN: 0.0000 [IU] | Freq: Three times a day (TID) | INTRAMUSCULAR | Status: DC

## 2023-09-14 MED ORDER — ONDANSETRON HCL 4 MG PO TABS: 4.0000 mg | ORAL_TABLET | ORAL | Status: DC | PRN

## 2023-09-14 SURGICAL SUPPLY — 89 items
BAG COUNTER SPONGE SURGICOUNT (BAG) ×3 IMPLANT
BAND RUBBER #18 3X1/16 STRL (MISCELLANEOUS) ×6 IMPLANT
BENZOIN TINCTURE PRP APPL 2/3 (GAUZE/BANDAGES/DRESSINGS) IMPLANT
BLADE CLIPPER SURG (BLADE) ×3 IMPLANT
BLADE SAW GIGLI 16 STRL (MISCELLANEOUS) IMPLANT
BLADE SURG 15 STRL LF DISP TIS (BLADE) IMPLANT
BLADE ULTRA TIP 2M (BLADE) ×3 IMPLANT
BNDG GAUZE DERMACEA FLUFF 4 (GAUZE/BANDAGES/DRESSINGS) IMPLANT
BNDG STRETCH 4X75 NS LF (GAUZE/BANDAGES/DRESSINGS) IMPLANT
BNDG STRETCH 4X75 STRL LF (GAUZE/BANDAGES/DRESSINGS) IMPLANT
BUR ROUND FLUTED 5 RND (BURR) ×3 IMPLANT
BUR SPIRAL ROUTER 2.3 (BUR) ×3 IMPLANT
CANISTER SUCTION 3000ML PPV (SUCTIONS) ×6 IMPLANT
CASSETTE SUCT IRRIG SONOPET IQ (MISCELLANEOUS) IMPLANT
CATH VENTRIC 35X38 W/TROCAR LG (CATHETERS) IMPLANT
CLIP TI MEDIUM 6 (CLIP) IMPLANT
CNTNR URN SCR LID CUP LEK RST (MISCELLANEOUS) ×3 IMPLANT
COVER BURR HOLE UNIV 10 (Orthopedic Implant) IMPLANT
COVER MAYO STAND STRL (DRAPES) IMPLANT
COVERAGE SUPPORT O-ARM STEALTH (MISCELLANEOUS) ×2 IMPLANT
DRAIN SUBARACHNOID (WOUND CARE) IMPLANT
DRAPE HALF SHEET 40X57 (DRAPES) ×3 IMPLANT
DRAPE MICROSCOPE SLANT 54X150 (MISCELLANEOUS) IMPLANT
DRAPE NEUROLOGICAL W/INCISE (DRAPES) ×3 IMPLANT
DRAPE STERI IOBAN 125X83 (DRAPES) IMPLANT
DRAPE SURG 17X23 STRL (DRAPES) IMPLANT
DRAPE WARM FLUID 44X44 (DRAPES) ×3 IMPLANT
DRSG ADAPTIC 3X8 NADH LF (GAUZE/BANDAGES/DRESSINGS) IMPLANT
DRSG TELFA 3X8 NADH STRL (GAUZE/BANDAGES/DRESSINGS) IMPLANT
DURAPREP 6ML APPLICATOR 50/CS (WOUND CARE) ×3 IMPLANT
ELECTRODE REM PT RTRN 9FT ADLT (ELECTROSURGICAL) ×3 IMPLANT
EVACUATOR 1/8 PVC DRAIN (DRAIN) IMPLANT
EVACUATOR SILICONE 100CC (DRAIN) IMPLANT
FEE COVERAGE SUPPORT O-ARM (MISCELLANEOUS) ×3 IMPLANT
FORCEPS BIPOLAR SPETZLER 8 1.0 (NEUROSURGERY SUPPLIES) ×3 IMPLANT
GAUZE 4X4 16PLY ~~LOC~~+RFID DBL (SPONGE) IMPLANT
GAUZE SPONGE 4X4 12PLY STRL (GAUZE/BANDAGES/DRESSINGS) ×3 IMPLANT
GLOVE BIOGEL PI IND STRL 7.0 (GLOVE) IMPLANT
GLOVE BIOGEL PI IND STRL 7.5 (GLOVE) ×6 IMPLANT
GLOVE ECLIPSE 7.0 STRL STRAW (GLOVE) ×6 IMPLANT
GLOVE EXAM NITRILE XL STR (GLOVE) IMPLANT
GOWN STRL REUS W/ TWL LRG LVL3 (GOWN DISPOSABLE) ×6 IMPLANT
GOWN STRL REUS W/ TWL XL LVL3 (GOWN DISPOSABLE) IMPLANT
GOWN STRL REUS W/TWL 2XL LVL3 (GOWN DISPOSABLE) IMPLANT
GRAFT DURAGEN MATRIX 3X3 SNGL (Graft) IMPLANT
HEMOSTAT POWDER KIT SURGIFOAM (HEMOSTASIS) ×3 IMPLANT
HEMOSTAT SURGICEL 2X14 (HEMOSTASIS) ×3 IMPLANT
HOOK DURA 1/2IN (MISCELLANEOUS) ×3 IMPLANT
IV NS 1000ML BAXH (IV SOLUTION) ×3 IMPLANT
KIT BASIN OR (CUSTOM PROCEDURE TRAY) ×3 IMPLANT
KIT DRAIN CSF ACCUDRAIN (MISCELLANEOUS) IMPLANT
KIT TURNOVER KIT B (KITS) ×3 IMPLANT
KNIFE ARACHNOID DISP AM-23-SB (BLADE) IMPLANT
KNIFE ARACHNOID DISP AM-24-S (MISCELLANEOUS) ×3 IMPLANT
MARKER SPHERE PSV REFLC NDI (MISCELLANEOUS) ×9 IMPLANT
NDL HYPO 22X1.5 SAFETY MO (MISCELLANEOUS) ×3 IMPLANT
NDL SPNL 18GX3.5 QUINCKE PK (NEEDLE) IMPLANT
NEEDLE HYPO 22X1.5 SAFETY MO (MISCELLANEOUS) ×2 IMPLANT
NEEDLE SPNL 18GX3.5 QUINCKE PK (NEEDLE) IMPLANT
NS IRRIG 1000ML POUR BTL (IV SOLUTION) ×9 IMPLANT
PACK BATTERY CMF DISP FOR DVR (ORTHOPEDIC DISPOSABLE SUPPLIES) IMPLANT
PACK CRANIOTOMY CUSTOM (CUSTOM PROCEDURE TRAY) ×3 IMPLANT
PATTIES SURGICAL .5 X.5 (GAUZE/BANDAGES/DRESSINGS) ×6 IMPLANT
PATTIES SURGICAL .5 X3 (DISPOSABLE) IMPLANT
PATTIES SURGICAL 1X1 (DISPOSABLE) ×6 IMPLANT
PIN MAYFIELD SKULL DISP (PIN) ×3 IMPLANT
SCREW UNIII AXS SD 1.5X4 (Screw) IMPLANT
SPECIMEN JAR SMALL (MISCELLANEOUS) IMPLANT
SPIKE FLUID TRANSFER (MISCELLANEOUS) ×3 IMPLANT
SPONGE NEURO XRAY DETECT 1X3 (DISPOSABLE) IMPLANT
SPONGE SURGIFOAM ABS GEL 100 (HEMOSTASIS) ×3 IMPLANT
STAPLER SKIN PROX 35W (STAPLE) ×3 IMPLANT
STOCKINETTE 6 STRL (DRAPES) IMPLANT
SUT 3-0 BLK 1X30 PSL (SUTURE) IMPLANT
SUT ETHILON 3 0 PS 1 (SUTURE) IMPLANT
SUT NURALON 4 0 TR CR/8 (SUTURE) ×9 IMPLANT
SUT SILK 0 TIES 10X30 (SUTURE) IMPLANT
SUT VIC AB 0 CT1 18XCR BRD8 (SUTURE) ×6 IMPLANT
SUT VIC AB 3-0 SH 8-18 (SUTURE) ×6 IMPLANT
SYR 30ML SLIP (SYRINGE) ×6 IMPLANT
TAPE CLOTH 1X10 TAN NS (GAUZE/BANDAGES/DRESSINGS) ×3 IMPLANT
TIP SONOPET IQ 12 BARRACUDA (TIP) IMPLANT
TOWEL GREEN STERILE (TOWEL DISPOSABLE) ×3 IMPLANT
TOWEL GREEN STERILE FF (TOWEL DISPOSABLE) ×3 IMPLANT
TRAY FOLEY MTR SLVR 16FR STAT (SET/KITS/TRAYS/PACK) ×3 IMPLANT
TUBE CONNECTING 12X1/4 (SUCTIONS) ×3 IMPLANT
TUBING FEATHERFLOW (TUBING) IMPLANT
UNDERPAD 30X36 HEAVY ABSORB (UNDERPADS AND DIAPERS) ×3 IMPLANT
WATER STERILE IRR 1000ML POUR (IV SOLUTION) ×3 IMPLANT

## 2023-09-14 NOTE — Inpatient Diabetes Management (Signed)
 Inpatient Diabetes Program Recommendations  AACE/ADA: New Consensus Statement on Inpatient Glycemic Control (2015)  Target Ranges:  Prepandial:   less than 140 mg/dL      Peak postprandial:   less than 180 mg/dL (1-2 hours)      Critically ill patients:  140 - 180 mg/dL   Lab Results  Component Value Date   GLUCAP 241 (H) 09/14/2023   HGBA1C 10.0 09/25/2016   Diabetes history: DM2 Outpatient Diabetes medications: Omnipod Dash with the [employer] 2 with ReliOn regular insulin  Basal pump setting: 12 am-3.2 units/hr 3am-2.75 units/hr 8am-3 units/hr 12pm-3 units/hr 5pm-3.15 units/hr 8pm-2.5 units/hr Total daily basal-70.7 units  Carb ratio: 1 unit for every 6 carbs  Correction factor: 1:40  Target: 200 mg/dL  Current Orders for glycemic control: Decadron 6 mg Q6H Novolog  0-20 TID  Inpatient Diabetes Program Recommendations:    Please consider:  Semglee 40 units BID Novolog  0-20 units Q4H Novolog  4 units TID with meals Decadron 6 mg Q6H  Met with patient and spouse at bedside.  He is a patient of Dr. Micheline with endocrinology.  He uses his insulin  pump and administers Lantus 80 unit every day with the insulin  pen.  Reviewed insulin  pump settings on his PDM-listed above. He gets a total of 150.7 units of basal insulin  per day with his pump and Lantus.  Recommend discontinuing insulin  pump while inpatient and administer SQ basal/bolus.  Spoke with Deward Eastern, PA.    Thank you, Wyvonna Pinal, MSN, CDCES Diabetes Coordinator Inpatient Diabetes Program [phone] (team pager from 8a-5p)

## 2023-09-14 NOTE — H&P (Signed)
 Chief Complaint   Tumor  History of Present Illness  Joel Williams is a 73 y.o. male initially seen urgently in the outpatient neurosurgery clinic after outpatient MRI obtained for constitutional symptoms including unintentional weight loss, generalized fatigue, and dizziness revealed a large peripherally enhancing temporal lesion.  The patient's case was discussed at the multidisciplinary neuro-oncology conference after systemic imaging did not reveal any evidence of primary malignancy.  Consensus opinion was to proceed with stereotactic craniotomy for resection of tumor.  Past Medical History   Past Medical History:  Diagnosis Date   Allergic rhinitis    Arthritis    ASTHMA 06/08/2008   Asthma    as a child   CAD (coronary artery disease)    a. s/p Promus DES (3.0 x 16 mm) to mid LAD 05/16/10;  b. Myoview  05/15/10: anteroseptal ischemia with EF 52%;   c. Cath 05/16/10: single vessel CAD with mLAD 90% tx with PCI and EF 50%;  d. 07/2011 Cath:  Patent stent, nonobs dzs, NL EF.   Chronic kidney disease    was seen by Dr. Lonna and released.   Colonic polyp    DIABETES MELLITUS, TYPE II 06/08/2008   GERD (gastroesophageal reflux disease)    Hemorrhoids    HYPERLIPIDEMIA 06/08/2008   HYPERTENSION 06/08/2008   Impotence of organic origin 08/24/2008   Myocardial infarction (HCC) 04/16/2010   Pneumonia    hx of   Renal vein thrombosis (HCC)    Seasonal allergies     Past Surgical History   Past Surgical History:  Procedure Laterality Date   BACK SURGERY  2011   CATARACT EXTRACTION Bilateral    2024   CORONARY ANGIOPLASTY WITH STENT PLACEMENT     LEFT HEART CATHETERIZATION WITH CORONARY ANGIOGRAM N/A 07/29/2011   Procedure: LEFT HEART CATHETERIZATION WITH CORONARY ANGIOGRAM;  Surgeon: Lonni JONETTA Cash, MD;  Location: [facility] CATH LAB;  Service: Cardiovascular;  Laterality: N/A;   LEFT HEART CATHETERIZATION WITH CORONARY ANGIOGRAM N/A 06/14/2013   Procedure: LEFT HEART  CATHETERIZATION WITH CORONARY ANGIOGRAM;  Surgeon: Lonni JONETTA Cash, MD;  Location: [facility] CATH LAB;  Service: Cardiovascular;  Laterality: N/A;   RADIOLOGY WITH ANESTHESIA N/A 06/09/2022   Procedure: MRI WITH ANESTHESIA OF LUMBAR SPINE WITHOUT CONTRAST;  Surgeon: Radiologist, Medication, MD;  Location: [facility] OR;  Service: Radiology;  Laterality: N/A;    Social History   Social History   Tobacco Use   Smoking status: Former    Current packs/day: 0.00    Average packs/day: 2.0 packs/day for 53.0 years (106.0 ttl pk-yrs)    Types: Cigarettes    Start date: 05/22/1949    Quit date: 05/23/2002    Years since quitting: 21.3   Smokeless tobacco: Never  Vaping Use   Vaping status: Never Used  Substance Use Topics   Alcohol use: Not Currently    Comment: Ocassional since starting Ozempic- fall 2023.   Drug use: No    Medications   Prior to Admission medications   Medication Sig Start Date End Date Taking? Authorizing Provider  amLODipine  (NORVASC ) 10 MG tablet Take 1 tablet (10 mg total) by mouth daily. Patient taking differently: Take 10 mg by mouth at bedtime. 06/10/22  Yes Parthenia Olivia HERO, PA-C  Ascorbic Acid (VITAMIN C) 1000 MG tablet Take 1,000 mg by mouth in the morning and at bedtime.   Yes [provider]  aspirin  81 MG tablet Take 1 tablet (81 mg total) by mouth daily. 09/03/10  Yes Cash Lonni JONETTA, MD  B-D ULTRAFINE III SHORT PEN 31G X 8 MM MISC Inject 1 Syringe into the skin daily. 01/21/21  Yes [provider]  BIOTIN PO Take 1 tablet by mouth every evening.   Yes [provider]  glucagon 1 MG injection Inject 1 mg into the vein once as needed (Low blood sugar).    Yes [provider]  insulin  glargine (LANTUS SOLOSTAR) 100 UNIT/ML Solostar Pen Inject 80 Units into the skin in the morning.   Yes [provider]  lisinopril  (ZESTRIL ) 40 MG tablet Take 1 tablet (40 mg total) by mouth daily. 06/10/22  Yes Parthenia Olivia HERO, PA-C   metFORMIN  (GLUCOPHAGE -XR) 500 MG 24 hr tablet Take 1,000 mg by mouth 2 (two) times daily.   Yes [provider]  metoprolol  succinate (TOPROL -XL) 50 MG 24 hr tablet Take with or immediately following a meal. 06/10/22  Yes Parthenia Olivia HERO, PA-C  Multiple Vitamins-Minerals (PRESERVISION AREDS 2 PO) Take 2 capsules by mouth at bedtime.   Yes [provider]  NOVOLIN R RELION 100 UNIT/ML injection USE AS DIRECTED VIA INSULIN  PUMP 02/21/18  Yes [provider]  Potassium 99 MG TABS Take 99 mg by mouth every evening.   Yes [provider]  simvastatin  (ZOCOR ) 20 MG tablet Take 1 tablet (20 mg total) by mouth daily. 06/10/22  Yes Parthenia Olivia HERO, PA-C  VITAMIN D PO Take 5,000 Units by mouth in the morning.   Yes [provider]  vitamin E 400 UNIT capsule Take 400 Units by mouth in the morning.   Yes [provider]  zinc sulfate, 50mg  elemental zinc, 220 (50 Zn) MG capsule Take 220 mg by mouth at bedtime.   Yes [provider]  albuterol  (VENTOLIN  HFA) 108 (90 Base) MCG/ACT inhaler Inhale 1-2 puffs into the lungs every 6 (six) hours as needed for wheezing or shortness of breath. 08/22/23   Minnie Tinnie BRAVO, PA  Continuous Glucose Sensor (FREESTYLE LIBRE 3 PLUS SENSOR) MISC apply to upper back of arm for 90 days change sensor every 15 days IC-10 CODE: E11.9    [provider]  Insulin  Disposable Pump (OMNIPOD DASH PODS, GEN 4,) MISC Inject into the skin. 02/04/21   [provider]  Insulin  Human (INSULIN  PUMP) 100 unit/ml SOLN Inject into the skin continuous. NOVOLIN R RELION 100 UNIT/ML injection; basal rate: 85-100 units per day.    [provider]  SEMAGLUTIDE, 2 MG/DOSE, [place] Inject 2 mg into the skin every Monday.    [provider]    Allergies   Allergies  Allergen Reactions   Invokana [Canagliflozin]     Yeast infections   Trulicity [Dulaglutide] Other (See Comments)    yeast infections     Review of Systems  ROS  Neurologic Exam  Awake, alert, oriented Memory and concentration grossly intact Speech fluent, appropriate CN grossly intact Motor exam: Upper Extremities Deltoid Bicep Tricep Grip  Right 5/5 5/5 5/5 5/5  Left 5/5 5/5 5/5 5/5   Lower Extremities IP Quad PF DF EHL  Right 5/5 5/5 5/5 5/5 5/5  Left 5/5 5/5 5/5 5/5 5/5   Sensation grossly intact to LT  Imaging  MRI brain with and without contrast reveals an approximately 3-1/2 cm posterior right temporal peripheral enhancing mass.  There is associated right temporal edema.  There is also enhancement involving the ependymal of the right temporal horn.  There may be small subcentimeter satellite lesions.  Impression  - 73 y.o. male presenting with  constitutional symptoms related to likely high-grade primary brain tumor  Plan  - We will plan on proceeding with stereotactic right temporal occipital craniotomy for resection of tumor  I have reviewed the indications for the procedure as well as the details of the procedure and the expected postoperative course and recovery at length with the patient and family in the office. We have also reviewed in detail the risks, benefits, and alternatives to the procedure. All questions were answered and Joel Williams provided informed consent to proceed.  Gerldine Maizes, MD [facility]

## 2023-09-14 NOTE — Progress Notes (Signed)
 CGM and insulin  pump removed per diabetes coordinator and MD recommendations. Pt and family aware.

## 2023-09-14 NOTE — Op Note (Signed)
 NEUROSURGERY OPERATIVE NOTE   PREOP DIAGNOSIS:  Right temporal tumor   POSTOP DIAGNOSIS: Same  PROCEDURE: Stereotactic right temporo-occipital craniotomy for resection of tumor Use of intraoperative microscope for microdissection  SURGEON: Dr. Gerldine Maizes, MD  ASSISTANT: Joel Pickle, PA-C  ANESTHESIA: General Endotracheal  EBL: 500cc  SPECIMENS: Right temporal tumor for permanent pathology  DRAINS: None  COMPLICATIONS: None immediate  CONDITION: Hemodynamically stable to PACU  HISTORY: Joel Williams is a 73 y.o. male initially presenting to his primary doctor with generalized fatigue, unintentional weight loss, and dizziness.  Outpatient MRI was obtained demonstrating a large right temporal tumor.  He was seen in the outpatient neurosurgery clinic on an urgent basis where further workup included contrast enhanced MRI and systemic imaging.  Patient's case was discussed at the multidisciplinary neuro-oncology conference where consensus opinion was to proceed with tumor resection.  The risks, benefits, and alternatives to surgery were all reviewed in detail with the patient and his family.  After all questions were answered informed consent was obtained and witnessed.  PROCEDURE IN DETAIL: The patient was brought to the operating room. After induction of general anesthesia, the patient was positioned on the operative table in the [place] head holder in the supine position with a right sided shoulder roll in order to expose the posterior aspect of the temporal and the occipital regions. All pressure points were meticulously padded.  Surface markers were then Co. registered with the preoperative stereotactic MRI scan and an excellent accuracy of approximately 2 mm was achieved.  Stereotactic system was then used to mark out the surface projection of the transverse and sigmoid sinuses on the right side.  A barn door type incision was then marked out to allow access to the  entire tumor.    After timeout was conducted, the skin incision was infiltrated with local anesthetic with epinephrine.  Incision was then made sharply and carried down through the galea.  Hemostasis on the skin edges was achieved with Raney clips.  Bovie electrocautery was then used to incise the temporalis muscle and fascia, and a single piece myocutaneous flap was reflected inferiorly.  Stereotactic system was again used in order to identify the underlying transverse and sigmoid sinuses.  Multiple bur holes were then created just above the transverse sinus, as well as bur holes more superiorly in order to allow access to the superior anterior and posterior margins of the tumor.  These were then connected with the craniotome and a single piece temporo-occipital craniotomy flap was elevated.  Hemostasis was easily secured on the epidural plane.  Stereotactic system was then used again and I extended the craniotomy a little bit more anteriorly in order to gain access to that portion of the tumor.  At this point the dura was incised in curvilinear fashion based inferiorly and tacked up with 4-0 Nurolon.  Microscope was then draped sterilely and brought into the field and the tumor resection was done under the microscope using microdissection technique.  The tumor was somewhat adherent to the surface of the dura, and these adhesions were taken down with the bipolar.  Tumor was easily visible on the surface of the posterior temporal lobe, somewhat more red in color.  The peel was coagulated around the periphery of the visible tumor.  Using a combination of bipolar electrocautery and the ultrasonic aspirator, I was able to develop a plane between the tumor and the surrounding white matter.  This was initially done in the superior and anterior planes, followed by  the posterior plane.  I was then able to further dissect the tumor away from the dura along the tentorium.  The pia along the tentorium was also coagulated  and divided.  I was then able to dissect the tumor away from surrounding white matter in its medial margin.  It did appear to be densely adherent to the ventricular ependymoma, and I did enter the ventricle at the level of the trigone.  This was covered with a large piece of Gelfoam for the remainder of the tumor resection.  In this manner, I was able to develop the medial plane and the tumor was then removed en bloc and sent for permanent pathology.    The resection cavity was then checked with the stereotactic system and appeared to be encompassing the entire tumor.  There was no visibly identifiable residual tumor.  Hemostasis was then secured using a combination of bipolar electrocautery and morselized Gelfoam and thrombin.  Wound was then irrigated with a copious amount of normal saline without any active bleeding identified.  The dura was then reapproximated with interrupted 4-0 Nurolon stitches.  A layer of DuraGen onlay was then placed.  Bone flap was then replaced and plated with standard titanium plates and screws.  The temporalis muscle and fascia were then reapproximated with interrupted 0 Vicryl stitches and the galea was reapproximated with interrupted 3-0 Vicryl stitches.  Skin was closed with staples.  Sterile dressing with bacitracin was then placed.  The patient was then removed from the Mayfield head holder.  A head wrap was placed.  He was then extubated and taken to the postanesthesia care unit in stable hemodynamic condition.  At the end of the case all sponge, needle, instrument, and cottonoid counts were correct.   Gerldine Maizes, MD [facility]

## 2023-09-14 NOTE — Progress Notes (Signed)
 Notified anesthesia of elevated CBG and decreased rate on insulin  pump. Anesthesia stated they would just monitor CBGs in surgery.

## 2023-09-14 NOTE — Transfer of Care (Signed)
 Immediate Anesthesia Transfer of Care Note  Patient: Joel Williams  Procedure(s) Performed: STEREOTACTIC RIGHT TEMPRO-OCCIPITAL CRANIOTOMY FOR RESECTION OF TUMOR (Right: Head) COMPUTER-ASSISTED NAVIGATION, FOR CRANIAL PROCEDURE (Right)  Patient Location: PACU  Anesthesia Type:General  Level of Consciousness: drowsy and patient cooperative  Airway & Oxygen Therapy: Patient Spontanous Breathing and Patient connected to face mask oxygen  Post-op Assessment: Report given to RN and Post -op Vital signs reviewed and stable  Post vital signs: Reviewed and stable  Last Vitals:  Vitals Value Taken Time  BP 181/92 09/14/23 13:05  Temp    Pulse 83 09/14/23 13:08  Resp 27 09/14/23 13:08  SpO2 98 % 09/14/23 13:08  Vitals shown include unfiled device data.  Last Pain:  Vitals:   09/14/23 0615  TempSrc:   PainSc: 0-No pain         Complications: No notable events documented.

## 2023-09-14 NOTE — Consult Note (Signed)
 NAME:  Joel Williams, MRN:  [id number], DOB:  06-08-1950, LOS: 0 ADMISSION DATE:  09/14/2023, CONSULTATION DATE:  7/1 REFERRING MD:  Dr. Lanis, CHIEF COMPLAINT:  brain tumor   History of Present Illness:  73 year old male with past medical history as below, which is significant for asthma, coronary artery disease, chronic kidney disease, diabetes mellitus type 2 with insulin  pump, hypertension, and hyperlipidemia.  Initially presented to PCP with complaints of dizziness, unintentional weight loss, and fatigue.  Outpatient MRI diagnosed a large right temporal lobe tumor.  After discussion with the multidisciplinary neuro-oncology team he presented for surgical resection on 7/1.  The procedure was performed without complication and postoperatively the patient was sent to the ICU for recovery.  PCCM was consulted in that setting.  Pertinent  Medical History   has a past medical history of Allergic rhinitis, Arthritis, ASTHMA (06/08/2008), Asthma, CAD (coronary artery disease), Chronic kidney disease, Colonic polyp, DIABETES MELLITUS, TYPE II (06/08/2008), GERD (gastroesophageal reflux disease), Hemorrhoids, HYPERLIPIDEMIA (06/08/2008), HYPERTENSION (06/08/2008), Impotence of organic origin (08/24/2008), Myocardial infarction (HCC) (04/16/2010), Pneumonia, Renal vein thrombosis (HCC), and Seasonal allergies.   Significant Hospital Events: Including procedures, antibiotic start and stop dates in addition to other pertinent events     Interim History / Subjective:    Objective    Blood pressure (!) 130/38, pulse 86, temperature 98 F (36.7 C), resp. rate 12, height 5' 9 (1.753 m), weight 99.3 kg, SpO2 (!) 89%.        Intake/Output Summary (Last 24 hours) at 09/14/2023 1522 Last data filed at 09/14/2023 1430 Gross per 24 hour  Intake 2100 ml  Output 1240 ml  Net 860 ml   Filed Weights   09/14/23 0558  Weight: 99.3 kg    Examination: General: Elderly appearing male in no acute  distress HENT: Surgical dressing in place.  PERRL Lungs: Clear bilateral breath sounds Cardiovascular: Regular rate and rhythm Abdomen: Soft, nontender, nondistended.  Insulin  pump in place. Extremities: No acute deformity range of motion mentation Neuro: Somewhat groggy after surgery but improving, alert, oriented, focal   Resolved problem list   Assessment and Plan   Brain tumor: Status post surgical resection 7/1 - Postoperative management per neurosurgery - Frequent neurochecks - Keep systolic blood pressure less than 160 with Cleviprex infusion - Decadron taper  Diabetes mellitus: - Appreciate DM coordinator  - Will plan to forego insulin  pump  - Basal + SSI resistant (steroids).  - Adding meal coverage 4 units starting tomorrow.  - hold metformin , semaglutide  Asthma: - PRN albuterol   CAD history HTN - holding aspirin  for now - Continue home amlodipine , lisinopril , metoprolol , statin   Best Practice (right click and Reselect all SmartList Selections daily)   Diet/type: Regular consistency (see orders) DVT prophylaxis SCD Pressure ulcer(s): N/A GI prophylaxis: N/A Lines: N/A Foley:  N/A Code Status:  full code Last date of multidisciplinary goals of care discussion [ ]   Labs   CBC: No results for input(s): WBC, NEUTROABS, HGB, HCT, MCV, PLT in the last 168 hours.  Basic Metabolic Panel: No results for input(s): NA, K, CL, CO2, GLUCOSE, BUN, CREATININE, CALCIUM, MG, PHOS in the last 168 hours. GFR: CrCl cannot be calculated (Patient's most recent lab result is older than the maximum 21 days allowed.). No results for input(s): PROCALCITON, WBC, LATICACIDVEN in the last 168 hours.  Liver Function Tests: No results for input(s): AST, ALT, ALKPHOS, BILITOT, PROT, ALBUMIN in the last 168 hours. No results for input(s): LIPASE, AMYLASE in  the last 168 hours. No results for input(s): AMMONIA in the last  168 hours.  ABG No results found for: PHART, PCO2ART, PO2ART, HCO3, TCO2, ACIDBASEDEF, O2SAT   Coagulation Profile: No results for input(s): INR, PROTIME in the last 168 hours.  Cardiac Enzymes: No results for input(s): CKTOTAL, CKMB, CKMBINDEX, TROPONINI in the last 168 hours.  HbA1C: Hemoglobin A1C  Date/Time Value Ref Range Status  09/25/2016 12:00 AM 10.0  Final  06/12/2015 12:00 AM 8.1  Final    CBG: Recent Labs  Lab 09/10/23 0959 09/14/23 0557 09/14/23 0945 09/14/23 1151 09/14/23 1305  GLUCAP 255* 269* 186* 186* 225*    Review of Systems:    Bolds are positive  Constitutional: weight loss, gain, night sweats, Fevers, chills, fatigue .  HEENT: headaches, Sore throat, sneezing, nasal congestion, post nasal drip, Difficulty swallowing, Tooth/dental problems, visual complaints visual changes, ear ache CV:  chest pain, radiates:,Orthopnea, PND, swelling in lower extremities**, dizziness, palpitations, syncope.  GI  heartburn, indigestion, abdominal pain, nausea, vomiting, diarrhea, change in bowel habits, loss of appetite, bloody stools.  Resp: cough, productive: , hemoptysis, dyspnea, chest pain, pleuritic.  Skin: rash or itching or icterus GU: dysuria, change in color of urine, urgency or frequency. flank pain, hematuria  MS: joint pain or swelling. decreased range of motion  Psych: change in mood or affect. depression or anxiety.  Neuro: difficulty with speech, weakness, numbness, ataxia    Past Medical History:  He,  has a past medical history of Allergic rhinitis, Arthritis, ASTHMA (06/08/2008), Asthma, CAD (coronary artery disease), Chronic kidney disease, Colonic polyp, DIABETES MELLITUS, TYPE II (06/08/2008), GERD (gastroesophageal reflux disease), Hemorrhoids, HYPERLIPIDEMIA (06/08/2008), HYPERTENSION (06/08/2008), Impotence of organic origin (08/24/2008), Myocardial infarction (HCC) (04/16/2010), Pneumonia, Renal vein thrombosis (HCC),  and Seasonal allergies.   Surgical History:   Past Surgical History:  Procedure Laterality Date   BACK SURGERY  2011   CATARACT EXTRACTION Bilateral    2024   CORONARY ANGIOPLASTY WITH STENT PLACEMENT     LEFT HEART CATHETERIZATION WITH CORONARY ANGIOGRAM N/A 07/29/2011   Procedure: LEFT HEART CATHETERIZATION WITH CORONARY ANGIOGRAM;  Surgeon: Lonni JONETTA Cash, MD;  Location: [facility] CATH LAB;  Service: Cardiovascular;  Laterality: N/A;   LEFT HEART CATHETERIZATION WITH CORONARY ANGIOGRAM N/A 06/14/2013   Procedure: LEFT HEART CATHETERIZATION WITH CORONARY ANGIOGRAM;  Surgeon: Lonni JONETTA Cash, MD;  Location: [facility] CATH LAB;  Service: Cardiovascular;  Laterality: N/A;   RADIOLOGY WITH ANESTHESIA N/A 06/09/2022   Procedure: MRI WITH ANESTHESIA OF LUMBAR SPINE WITHOUT CONTRAST;  Surgeon: Radiologist, Medication, MD;  Location: [facility] OR;  Service: Radiology;  Laterality: N/A;     Social History:   reports that he quit smoking about 21 years ago. His smoking use included cigarettes. He started smoking about 74 years ago. He has a 106 pack-year smoking history. He has never used smokeless tobacco. He reports that he does not currently use alcohol. He reports that he does not use drugs.   Family History:  His family history includes Angina in his father; Dementia in his mother; Diabetes in his father; Heart disease in his father.   Allergies Allergies  Allergen Reactions   Invokana [Canagliflozin]     Yeast infections   Trulicity [Dulaglutide] Other (See Comments)    yeast infections     Home Medications  Prior to Admission medications   Medication Sig Start Date End Date Taking? Authorizing Provider  amLODipine  (NORVASC ) 10 MG tablet Take 1 tablet (10 mg total) by mouth daily. Patient taking differently:  Take 10 mg by mouth at bedtime. 06/10/22  Yes Parthenia Olivia HERO, PA-C  Ascorbic Acid (VITAMIN C) 1000 MG tablet Take 1,000 mg by mouth in the morning and at bedtime.   Yes [provider]  aspirin  81 MG tablet Take 1 tablet (81 mg total) by mouth daily. 09/03/10  Yes Verlin Lonni BIRCH, MD  B-D ULTRAFINE III SHORT PEN 31G X 8 MM MISC Inject 1 Syringe into the skin daily. 01/21/21  Yes [provider]  BIOTIN PO Take 1 tablet by mouth every evening.   Yes [provider]  glucagon 1 MG injection Inject 1 mg into the vein once as needed (Low blood sugar).    Yes [provider]  insulin  glargine (LANTUS SOLOSTAR) 100 UNIT/ML Solostar Pen Inject 80 Units into the skin in the morning.   Yes [provider]  lisinopril  (ZESTRIL ) 40 MG tablet Take 1 tablet (40 mg total) by mouth daily. 06/10/22  Yes Parthenia Olivia HERO, PA-C  metFORMIN  (GLUCOPHAGE -XR) 500 MG 24 hr tablet Take 1,000 mg by mouth 2 (two) times daily.   Yes [provider]  metoprolol  succinate (TOPROL -XL) 50 MG 24 hr tablet Take with or immediately following a meal. 06/10/22  Yes Parthenia Olivia HERO, PA-C  Multiple Vitamins-Minerals (PRESERVISION AREDS 2 PO) Take 2 capsules by mouth at bedtime.   Yes [provider]  NOVOLIN R RELION 100 UNIT/ML injection USE AS DIRECTED VIA INSULIN  PUMP 02/21/18  Yes [provider]  Potassium 99 MG TABS Take 99 mg by mouth every evening.   Yes [provider]  simvastatin  (ZOCOR ) 20 MG tablet Take 1 tablet (20 mg total) by mouth daily. 06/10/22  Yes Parthenia Olivia HERO, PA-C  VITAMIN D PO Take 5,000 Units by mouth in the morning.   Yes [provider]  vitamin E 400 UNIT capsule Take 400 Units by mouth in the morning.   Yes [provider]  zinc sulfate, 50mg  elemental zinc, 220 (50 Zn) MG capsule Take 220 mg by mouth at bedtime.   Yes [provider]  albuterol  (VENTOLIN  HFA) 108 (90 Base) MCG/ACT inhaler Inhale 1-2 puffs into the lungs every 6 (six) hours as needed for wheezing or shortness of breath. 08/22/23   Minnie Tinnie BRAVO, PA  Continuous Glucose Sensor (FREESTYLE LIBRE 3 PLUS  SENSOR) MISC apply to upper back of arm for 90 days change sensor every 15 days IC-10 CODE: E11.9    [provider]  Insulin  Disposable Pump (OMNIPOD DASH PODS, GEN 4,) MISC Inject into the skin. 02/04/21   [provider]  Insulin  Human (INSULIN  PUMP) 100 unit/ml SOLN Inject into the skin continuous. NOVOLIN R RELION 100 UNIT/ML injection; basal rate: 85-100 units per day.    [provider]  SEMAGLUTIDE, 2 MG/DOSE, [place] Inject 2 mg into the skin every Monday.    [provider]     Critical care time:       Deward Eastern, AGACNP-BC [facility]  See Amion for personal pager PCCM on call pager [phone] until 7pm. Please call Elink 7p-7a. [phone]  09/14/2023 4:32 PM

## 2023-09-14 NOTE — Anesthesia Procedure Notes (Signed)
 Arterial Line Insertion Start/End7/03/2023 7:10 AM, 09/14/2023 7:20 AM Performed by: Leopoldo Bruckner, MD, Molina Hollenback, Bruckner PARAS, CRNA, CRNA  Patient location: Pre-op. Preanesthetic checklist: patient identified, IV checked, site marked, risks and benefits discussed, surgical consent, monitors and equipment checked, pre-op evaluation, timeout performed and anesthesia consent Lidocaine  1% used for infiltration Right, radial was placed Catheter size: 20 G Hand hygiene performed  and Seldinger technique used Allen's test indicative of satisfactory collateral circulation Attempts: 2 Procedure performed without using ultrasound guided technique. Following insertion, dressing applied and Biopatch. Post procedure assessment: normal  Patient tolerated the procedure well with no immediate complications.

## 2023-09-14 NOTE — Anesthesia Procedure Notes (Addendum)
 Procedure Name: Intubation Date/Time: 09/14/2023 8:17 AM  Performed by: Carolee Lauraine DASEN, CRNAPre-anesthesia Checklist: Patient identified, Emergency Drugs available, Suction available and Patient being monitored Patient Re-evaluated:Patient Re-evaluated prior to induction Oxygen Delivery Method: Circle System Utilized Preoxygenation: Pre-oxygenation with 100% oxygen Induction Type: IV induction Ventilation: Two handed mask ventilation required and Oral airway inserted - appropriate to patient size Laryngoscope Size: Mac and 4 Grade View: Grade II Tube type: Oral Tube size: 7.5 mm Number of attempts: 1 Airway Equipment and Method: Stylet and Oral airway Placement Confirmation: ETT inserted through vocal cords under direct vision, positive ETCO2 and breath sounds checked- equal and bilateral Secured at: 23 cm Tube secured with: Tape Dental Injury: Teeth and Oropharynx as per pre-operative assessment  Comments: Performed by Mickiel KET with supervision.

## 2023-09-15 ENCOUNTER — Encounter (HOSPITAL_COMMUNITY): Payer: Self-pay | Admitting: Neurosurgery

## 2023-09-15 ENCOUNTER — Inpatient Hospital Stay (HOSPITAL_COMMUNITY)

## 2023-09-15 LAB — COMPREHENSIVE METABOLIC PANEL WITH GFR
ALT: 17 U/L (ref 0–44)
AST: 21 U/L (ref 15–41)
Albumin: 3.3 g/dL — ABNORMAL LOW (ref 3.5–5.0)
Alkaline Phosphatase: 42 U/L (ref 38–126)
Anion gap: 11 (ref 5–15)
BUN: 11 mg/dL (ref 8–23)
CO2: 24 mmol/L (ref 22–32)
Calcium: 8.9 mg/dL (ref 8.9–10.3)
Chloride: 101 mmol/L (ref 98–111)
Creatinine, Ser: 0.68 mg/dL (ref 0.61–1.24)
GFR, Estimated: >60 mL/min (ref >60)
Glucose, Bld: 255 mg/dL — ABNORMAL HIGH (ref 70–99)
Potassium: 3.7 mmol/L (ref 3.5–5.1)
Sodium: 136 mmol/L (ref 135–145)
Total Bilirubin: 0.8 mg/dL (ref 0.0–1.2)
Total Protein: 6.2 g/dL — ABNORMAL LOW (ref 6.5–8.1)

## 2023-09-15 LAB — GLUCOSE, CAPILLARY
Glucose-Capillary: 186 mg/dL — ABNORMAL HIGH (ref 70–99)
Glucose-Capillary: 224 mg/dL — ABNORMAL HIGH (ref 70–99)
Glucose-Capillary: 224 mg/dL — ABNORMAL HIGH (ref 70–99)
Glucose-Capillary: 204 mg/dL — ABNORMAL HIGH (ref 70–99)
Glucose-Capillary: 200 mg/dL — ABNORMAL HIGH (ref 70–99)
Glucose-Capillary: 188 mg/dL — ABNORMAL HIGH (ref 70–99)

## 2023-09-15 LAB — CBC
HCT: 39.4 % (ref 39.0–52.0)
Hemoglobin: 13.2 g/dL (ref 13.0–17.0)
MCH: 28 pg (ref 26.0–34.0)
MCHC: 33.5 g/dL (ref 30.0–36.0)
MCV: 83.5 fL (ref 80.0–100.0)
Platelets: 153 10*3/uL (ref 150–400)
RBC: 4.72 MIL/uL (ref 4.22–5.81)
RDW: 13.9 % (ref 11.5–15.5)
WBC: 9.2 10*3/uL (ref 4.0–10.5)
nRBC: 0 % (ref 0.0–0.2)

## 2023-09-15 LAB — SURGICAL PATHOLOGY

## 2023-09-15 LAB — MAGNESIUM: Magnesium: 1.5 mg/dL — ABNORMAL LOW (ref 1.7–2.4)

## 2023-09-15 LAB — PHOSPHORUS: Phosphorus: 3.1 mg/dL (ref 2.5–4.6)

## 2023-09-15 MED ORDER — INSULIN ASPART 100 UNIT/ML IJ SOLN
0.0000 [IU] | Freq: Three times a day (TID) | INTRAMUSCULAR | Status: DC
  Administered 2023-09-15: 4 [IU] via SUBCUTANEOUS
  Administered 2023-09-15 – 2023-09-16 (×2): 7 [IU] via SUBCUTANEOUS
  Administered 2023-09-16: 4 [IU] via SUBCUTANEOUS
  Administered 2023-09-16 – 2023-09-17 (×2): 3 [IU] via SUBCUTANEOUS
  Administered 2023-09-17: 7 [IU] via SUBCUTANEOUS

## 2023-09-15 MED ORDER — INSULIN GLARGINE-YFGN 100 UNIT/ML ~~LOC~~ SOLN
40.0000 [IU] | Freq: Two times a day (BID) | SUBCUTANEOUS | Status: DC
  Administered 2023-09-15 – 2023-09-17 (×4): 40 [IU] via SUBCUTANEOUS
  Filled 2023-09-15 (×6): qty 0.4

## 2023-09-15 MED ORDER — GADOBUTROL 1 MMOL/ML IV SOLN
10.0000 mL | Freq: Once | INTRAVENOUS | Status: AC | PRN
  Administered 2023-09-15: 10 mL via INTRAVENOUS

## 2023-09-15 MED ORDER — HYDRALAZINE HCL 10 MG PO TABS
10.0000 mg | ORAL_TABLET | Freq: Three times a day (TID) | ORAL | Status: DC
  Administered 2023-09-15 – 2023-09-16 (×2): 10 mg via ORAL
  Filled 2023-09-15 (×3): qty 1

## 2023-09-15 MED ORDER — HYDRALAZINE HCL 25 MG PO TABS
25.0000 mg | ORAL_TABLET | Freq: Three times a day (TID) | ORAL | Status: DC
  Administered 2023-09-15: 25 mg via ORAL
  Filled 2023-09-15: qty 1

## 2023-09-15 MED FILL — Thrombin For Soln 20000 Unit: CUTANEOUS | Qty: 1 | Status: AC

## 2023-09-15 NOTE — Inpatient Diabetes Management (Signed)
 Inpatient Diabetes Program Recommendations  AACE/ADA: New Consensus Statement on Inpatient Glycemic Control (2015)  Target Ranges:  Prepandial:   less than 140 mg/dL      Peak postprandial:   less than 180 mg/dL (1-2 hours)      Critically ill patients:  140 - 180 mg/dL   Lab Results  Component Value Date   GLUCAP 224 (H) 09/15/2023   HGBA1C 7.4 (H) 09/14/2023    Review of Glycemic Control  Latest Reference Range & Units 09/14/23 15:40 09/14/23 19:49 09/14/23 23:26 09/15/23 03:25 09/15/23 07:40  Glucose-Capillary 70 - 99 mg/dL 758 (H) 796 (H) 778 (H) 186 (H) 224 (H)  (H): Data is abnormally high  Inpatient Diabetes Program Recommendations:    Please consider increasing basal:  Semglee 40 units BID.  Thank you, Wyvonna Pinal, MSN, CDCES Diabetes Coordinator Inpatient Diabetes Program [phone] (team pager from 8a-5p)

## 2023-09-15 NOTE — Progress Notes (Addendum)
  NEUROSURGERY PROGRESS NOTE   No issues overnight. Pt does c/o some HA this am.   EXAM:  BP 133/73   Pulse 90   Temp 98.9 F (37.2 C) (Oral)   Resp 15   Ht 5' 9 (1.753 m)   Wt 99.3 kg   SpO2 97%   BMI 32.33 kg/m   Awake, alert, oriented  Speech fluent MAE well, good strength. Has RUE > LUE tremor Headwrap in place, c/d/I  IMAGING: Postop MRI reviewed and demonstrates near complete resection of tumor. Wispy enhancement seen along the posterior resection cavity. Posterior satellite lesion unchanged. Enhancement of the right lateral ventricle is no longer seen.  IMPRESSION:  72 y.o. male POD#1 right temporo-occipital crani for resection of tumor, doing well.   PLAN: - d/c a-line, Foley this am - Mobilize today - Cont Postop keppra  - In the absence of significant edema on postop MRI, will d/c dexamethasone and cont with baseline and sliding scale insulin    Gerldine Maizes, MD [facility]

## 2023-09-15 NOTE — Evaluation (Signed)
 Physical Therapy Evaluation Patient Details Name: Joel Williams MRN: [id number] DOB: [birth year] Today's Date: 09/15/2023  History of Present Illness  Pt is a 73 y.o. male who presented 09/14/23 for stereotactic R temporo-occipital craniotomy for resection of large R temporal tumor. Postop MRI reviewed and demonstrates near complete resection of tumor. PMH: arthritis, asthma, CAD, CKD, DM2, GERD, HLD, HTN, MI, renal vein thrombosis   Clinical Impression  Pt presents with condition above and deficits mentioned below, see PT Problem List. A month ago, the pt was completely independent without DME, driving, and cognitively intact. Over the past month, the pt has cognitively and functionally declined, recently relying on a RW to mobilize. He lives with his wife in a 1-level house with 5 STE. Currently, the pt is demonstrating deficits in strength, coordination, balance, cognition, power, and activity tolerance. He also appears to have deficits in L attention and was noted to have disconjugate gaze with his L eye drifting medially. The pt confirmed some double vision. He was also noted to have difficulty fully tracking to L (tracks ~75% of the way). He seems to have varying STM deficits, recalling some things but not others, like repeatedly mixing up the time of the day despite repeatedly reviewing it earlier in the session. He also demonstrates deficits in motor planning, specifically with bed mobility, needing modA to ascend his trunk and scoot anteriorly to sit up EOB. The pt was also noted to be tremulous throughout his extremities. His children do endorse some very mild tremors about a month ago, but they have progressively worsened as well. He is currently requiring up to minA for transfers and ambulating with a RW. At this time, he is at high risk for falls and is demonstrating a drastic functional decline compared to his baseline. He could greatly benefit from intensive inpatient rehab, > 3 hours/day.  Will continue to follow acutely.    If plan is discharge home, recommend the following: A little help with walking and/or transfers;A little help with bathing/dressing/bathroom;Assistance with cooking/housework;Direct supervision/assist for medications management;Direct supervision/assist for financial management;Assist for transportation;Help with stairs or ramp for entrance;Supervision due to cognitive status   Can travel by private vehicle        Equipment Recommendations Wheelchair (measurements PT);Wheelchair cushion (measurements PT) (for community mobility)  Recommendations for Other Services  Rehab consult;Speech consult    Functional Status Assessment Patient has had a recent decline in their functional status and demonstrates the ability to make significant improvements in function in a reasonable and predictable amount of time.     Precautions / Restrictions Precautions Precautions: Fall Restrictions Weight Bearing Restrictions Per Provider Order: No      Mobility  Bed Mobility Overal bed mobility: Needs Assistance Bed Mobility: Supine to Sit     Supine to sit: Mod assist, HOB elevated     General bed mobility comments: Pt had difficulty motor planning how to sit up R EOB, needing extra time, cuing, and ultimately modA to ascend trunk, scoot to edge, and gain balance. Pt often kept falling back to his R elbow each time he tried to ascend his trunk without assistance    Transfers Overall transfer level: Needs assistance Equipment used: Rolling walker (2 wheels) Transfers: Sit to/from Stand Sit to Stand: Min assist           General transfer comment: Cues needed for hand placement, minA for balance when standing from EOB    Ambulation/Gait Ambulation/Gait assistance: Min assist Gait Distance (Feet): 110 Feet Assistive  device: Rolling walker (2 wheels) Gait Pattern/deviations: Step-through pattern, Decreased step length - right, Decreased step length -  left, Decreased stride length, Decreased dorsiflexion - right, Decreased dorsiflexion - left, Shuffle, Trunk flexed Gait velocity: reduced Gait velocity interpretation: <1.31 ft/sec, indicative of household ambulator   General Gait Details: Pt takes slow, small steps with a flexed posture. VCs provided to remain proximal to RW, stand upright, and increase bil step length and feet clearance. Momentary success noted. MinA provided for balance. Needs cues to visually scan and find people in the hall  Stairs            Wheelchair Mobility     Tilt Bed    Modified Rankin (Stroke Patients Only) Modified Rankin (Stroke Patients Only) Pre-Morbid Rankin Score: No symptoms Modified Rankin: Moderately severe disability     Balance Overall balance assessment: Needs assistance Sitting-balance support: Feet supported, Bilateral upper extremity supported, Single extremity supported Sitting balance-Leahy Scale: Poor Sitting balance - Comments: static sitting EOB with UE support, initially demonstrating a posterior bias, progressing from minA to CGA Postural control: Posterior lean Standing balance support: Bilateral upper extremity supported, During functional activity, Reliant on assistive device for balance Standing balance-Leahy Scale: Poor Standing balance comment: reliant on RW and minA                             Pertinent Vitals/Pain Pain Assessment Pain Assessment: Faces Faces Pain Scale: Hurts little more Pain Location: headache Pain Descriptors / Indicators: Headache Pain Intervention(s): Limited activity within patient's tolerance, Monitored during session, Repositioned, Patient requesting pain meds-RN notified    Home Living Family/patient expects to be discharged to:: Private residence Living Arrangements: Spouse/significant other Available Help at Discharge: Family;Available 24 hours/day Type of Home: House Home Access: Stairs to enter Entrance  Stairs-Rails: Right;Left (can reach both in front, but not in back) Entrance Stairs-Number of Steps: 5   Home Layout: One level Home Equipment: Agricultural consultant (2 wheels);Shower seat;Grab bars - tub/shower      Prior Function Prior Level of Function : Independent/Modified Independent;Driving             Mobility Comments: A month ago, pt was completely independent without DME; over the past month, the pt has transitioned to needing to use a RW for mobility ADLs Comments: A month ago, pt was completely independent, driving, and cognitively intact; over the past month, his cognition has gradually worsened and now is unable to drive     Extremity/Trunk Assessment   Upper Extremity Assessment Upper Extremity Assessment: Defer to OT evaluation    Lower Extremity Assessment Lower Extremity Assessment: RLE deficits/detail;LLE deficits/detail;Generalized weakness RLE Deficits / Details: tremulous throughout arms and legs; MMT scores of 4 hip flexion, 4+ knee extension, 4+ ankle dorsiflexion; sensation intact bil; incoordination noted with rapid alternating movements RLE Coordination: decreased fine motor;decreased gross motor LLE Deficits / Details: tremulous throughout arms and legs; MMT scores of 4 hip flexion, 4+ knee extension, 4+ ankle dorsiflexion; sensation intact bil; incoordination noted with rapid alternating movements LLE Coordination: decreased fine motor;decreased gross motor       Communication   Communication Communication: No apparent difficulties    Cognition Arousal: Alert Behavior During Therapy: WFL for tasks assessed/performed   PT - Cognitive impairments: Orientation, Awareness, Memory, Attention, Initiation, Sequencing, Problem solving, Safety/Judgement   Orientation impairments: Time                   PT -  Cognition Comments: Pt was sleeping upon arrival but easy to arouse. Pt problem-solved to use clock to identify it was 4:00 but pt thought it was  4 AM even though it was 4 PM and the blind was open to reveal sunlight outside. Pt forgot the time multiple times during the session, asking if it was morning. Pt also able to identify location when asked but then would make comments that suggested some decreased awareness of location, almost seeming like he thought he was home. Decreased L attention noted. Motor planning deficits also noted. Decreased insight into deficits and safety Following commands: Impaired Following commands impaired: Only follows one step commands consistently, Follows one step commands with increased time, Follows multi-step commands inconsistently     Cueing Cueing Techniques: Verbal cues, Tactile cues, Visual cues     General Comments General comments (skin integrity, edema, etc.): VSS on RA; noted disconjugate gaze with L eye drifting medially, pt confirmed some double vision; noted difficulty fully tracking to L, gets ~75% of the way; encouraged family to play music and card games with pt to work on cognition    Exercises     Assessment/Plan    PT Assessment Patient needs continued PT services  PT Problem List Decreased strength;Decreased balance;Decreased activity tolerance;Decreased mobility;Decreased coordination;Decreased cognition;Decreased knowledge of use of DME;Decreased safety awareness       PT Treatment Interventions DME instruction;Gait training;Stair training;Functional mobility training;Therapeutic activities;Therapeutic exercise;Neuromuscular re-education;Cognitive remediation;Balance training;Patient/family education    PT Goals (Current goals can be found in the Care Plan section)  Acute Rehab PT Goals Patient Stated Goal: to get better PT Goal Formulation: With patient/family Time For Goal Achievement: 09/29/23 Potential to Achieve Goals: Good    Frequency Min 3X/week     Co-evaluation               AM-PAC PT 6 Clicks Mobility  Outcome Measure Help needed turning from your  back to your side while in a flat bed without using bedrails?: A Lot Help needed moving from lying on your back to sitting on the side of a flat bed without using bedrails?: A Lot Help needed moving to and from a bed to a chair (including a wheelchair)?: A Little Help needed standing up from a chair using your arms (e.g., wheelchair or bedside chair)?: A Little Help needed to walk in hospital room?: A Little Help needed climbing 3-5 steps with a railing? : Total 6 Click Score: 14    End of Session Equipment Utilized During Treatment: Gait belt Activity Tolerance: Patient tolerated treatment well Patient left: in chair;with call bell/phone within reach;with family/visitor present Nurse Communication: Mobility status;Patient requests pain meds PT Visit Diagnosis: Unsteadiness on feet (R26.81);Other abnormalities of gait and mobility (R26.89);Muscle weakness (generalized) (M62.81);Difficulty in walking, not elsewhere classified (R26.2);Other symptoms and signs involving the nervous system (R29.898)    Time: 8399-8355 PT Time Calculation (min) (ACUTE ONLY): 44 min   Charges:   PT Evaluation $PT Eval Moderate Complexity: 1 Mod PT Treatments $Gait Training: 8-22 mins $Therapeutic Activity: 8-22 mins PT General Charges $$ ACUTE PT VISIT: 1 Visit         Theo Ferretti, PT, DPT Acute Rehabilitation Services  Office: [phone]   Theo CHRISTELLA Ferretti 09/15/2023, 5:55 PM

## 2023-09-15 NOTE — Progress Notes (Signed)
 NAME:  Joel Williams, MRN:  [id number], DOB:  June 03, 1950, LOS: 1 ADMISSION DATE:  09/14/2023, CONSULTATION DATE:   REFERRING MD:  neurosurgery, CHIEF COMPLAINT:  brain mass resection   History of Present Illness:  73 year old male with past medical history as below, which is significant for asthma, coronary artery disease, chronic kidney disease, diabetes mellitus type 2 with insulin  pump, hypertension, and hyperlipidemia. Initially presented to PCP with complaints of dizziness, unintentional weight loss, and fatigue. Outpatient MRI diagnosed a large right temporal lobe tumor. After discussion with the multidisciplinary neuro-oncology team he presented for surgical resection on 7/1. The procedure was performed without complication and postoperatively the patient was sent to the ICU for recovery. PCCM was consulted in that setting.   Pertinent  Medical History  DM on insulin  pump and lantus  Beach Cad status post cabg   Significant Hospital Events: Including procedures, antibiotic start and stop dates in addition to other pertinent events   7/1 S/p craniectomy and mass resection 7/2 on cleviprex, MRI with post op changes   Interim History / Subjective:  Doing well this morning. Has some hand tremors.   Objective    Blood pressure 134/61, pulse 92, temperature 99 F (37.2 C), temperature source Axillary, resp. rate 14, height 5' 9 (1.753 m), weight 99.3 kg, SpO2 95%.    FiO2 (%):  [28 %] 28 %   Intake/Output Summary (Last 24 hours) at 09/15/2023 1027 Last data filed at 09/15/2023 0700 Gross per 24 hour  Intake 2830.92 ml  Output 1925 ml  Net 905.92 ml   Filed Weights   09/14/23 0558  Weight: 99.3 kg    Examination: General: awake, alert, oriented HENT: eomi, perrl, mmm Lungs: clear Cardiovascular: regular rate and rhythm Abdomen: soft, nt, nd, no hsm Extremities: no c/c/e Neuro: awake, alert, orineted, moving all 4 limbs, mildly drowsy   Resolved problem list    Assessment and Plan  Brain tumor: Status post surgical resection 7/1 - Postoperative management per neurosurgery - Frequent neurochecks - Keep systolic blood pressure less than 160 with Cleviprex infusion - Decadron taper   Diabetes mellitus: - Appreciate DM coordinator  - Basal + SSI resistant (steroids).  - increase lantus 40 bid - Adding meal coverage 4 units - hold metformin , semaglutide   Asthma: - PRN albuterol    CAD history HTN - holding aspirin  for now - Continue home amlodipine , lisinopril , metoprolol , statin - adding hydralazine 25 tid  Best Practice (right click and Reselect all SmartList Selections daily)   Diet/type: Regular consistency (see orders) DVT prophylaxis SCD Pressure ulcer(s): N/A GI prophylaxis: N/A Lines: N/A Foley:  N/A Code Status:  full code Last date of multidisciplinary goals of care discussion [09/15/23]  Labs   CBC: Recent Labs  Lab 09/14/23 0948 09/14/23 1153 09/15/23 0900  WBC  --   --  9.2  HGB 11.9* 12.2* 13.2  HCT 35.0* 36.0* 39.4  MCV  --   --  83.5  PLT  --   --  153    Basic Metabolic Panel: Recent Labs  Lab 09/14/23 0948 09/14/23 1153 09/15/23 0900  NA 144 143 136  K 4.0 3.9 3.7  CL  --   --  101  CO2  --   --  24  GLUCOSE  --   --  255*  BUN  --   --  11  CREATININE  --   --  0.68  CALCIUM  --   --  8.9  MG  --   --  1.5*  PHOS  --   --  3.1   GFR: Estimated Creatinine Clearance: 94.1 mL/min (by C-G formula based on SCr of 0.68 mg/dL). Recent Labs  Lab 09/15/23 0900  WBC 9.2    Liver Function Tests: Recent Labs  Lab 09/15/23 0900  AST 21  ALT 17  ALKPHOS 42  BILITOT 0.8  PROT 6.2*  ALBUMIN 3.3*   No results for input(s): LIPASE, AMYLASE in the last 168 hours. No results for input(s): AMMONIA in the last 168 hours.  ABG    Component Value Date/Time   PHART 7.393 09/14/2023 1153   PCO2ART 39.4 09/14/2023 1153   PO2ART 145 (H) 09/14/2023 1153   HCO3 24.0 09/14/2023 1153    TCO2 25 09/14/2023 1153   ACIDBASEDEF 1.0 09/14/2023 1153   O2SAT 99 09/14/2023 1153     Coagulation Profile: No results for input(s): INR, PROTIME in the last 168 hours.  Cardiac Enzymes: No results for input(s): CKTOTAL, CKMB, CKMBINDEX, TROPONINI in the last 168 hours.  HbA1C: Hemoglobin A1C  Date/Time Value Ref Range Status  09/25/2016 12:00 AM 10.0  Final  06/12/2015 12:00 AM 8.1  Final   Hgb A1c MFr Bld  Date/Time Value Ref Range Status  09/14/2023 03:42 PM 7.4 (H) 4.8 - 5.6 % Final    Comment:    (NOTE) Diagnosis of Diabetes The following HbA1c ranges recommended by the American Diabetes Association (ADA) may be used as an aid in the diagnosis of diabetes mellitus.  Hemoglobin             Suggested A1C NGSP%              Diagnosis  <5.7                   Non Diabetic  5.7-6.4                Pre-Diabetic  >6.4                   Diabetic  <7.0                   Glycemic control for                       adults with diabetes.      CBG: Recent Labs  Lab 09/14/23 1540 09/14/23 1949 09/14/23 2326 09/15/23 0325 09/15/23 0740  GLUCAP 241* 203* 221* 186* 224*    Review of Systems:   Headache +  Past Medical History:  He,  has a past medical history of Allergic rhinitis, Arthritis, ASTHMA (06/08/2008), Asthma, CAD (coronary artery disease), Chronic kidney disease, Colonic polyp, DIABETES MELLITUS, TYPE II (06/08/2008), GERD (gastroesophageal reflux disease), Hemorrhoids, HYPERLIPIDEMIA (06/08/2008), HYPERTENSION (06/08/2008), Impotence of organic origin (08/24/2008), Myocardial infarction (HCC) (04/16/2010), Pneumonia, Renal vein thrombosis (HCC), and Seasonal allergies.   Surgical History:   Past Surgical History:  Procedure Laterality Date   BACK SURGERY  2011   CATARACT EXTRACTION Bilateral    2024   CORONARY ANGIOPLASTY WITH STENT PLACEMENT     LEFT HEART CATHETERIZATION WITH CORONARY ANGIOGRAM N/A 07/29/2011   Procedure: LEFT HEART  CATHETERIZATION WITH CORONARY ANGIOGRAM;  Surgeon: Lonni JONETTA Cash, MD;  [facility] C[facility] LAB;  Service: Cardiovascular;  Laterality: N/A;   LEFT HEART CATHETERIZATION WITH CORONARY ANGIOGRAM N/A 06/14/2013   Procedure: LEFT HEART CATHETERIZATION WITH CORONARY ANGIOGRAM;  Surgeon: Lonni JONETTA Cash, MD;  Location: [facility] CATH LAB;  Service: Cardiovascular;  Laterality:  N/A;   RADIOLOGY WITH ANESTHESIA N/A 06/09/2022   Procedure: MRI WITH ANESTHESIA OF LUMBAR SPINE WITHOUT CONTRAST;  Surgeon: Radiologist, Medication, MD;  Location: [facility] OR;  Service: Radiology;  Laterality: N/A;     Social History:   reports that he quit smoking about 21 years ago. His smoking use included cigarettes. He started smoking about 74 years ago. He has a 106 pack-year smoking history. He has never used smokeless tobacco. He reports that he does not currently use alcohol. He reports that he does not use drugs.   Family History:  His family history includes Angina in his father; Dementia in his mother; Diabetes in his father; Heart disease in his father.   Allergies Allergies  Allergen Reactions   Invokana [Canagliflozin]     Yeast infections   Trulicity [Dulaglutide] Other (See Comments)    yeast infections     Home Medications  Prior to Admission medications   Medication Sig Start Date End Date Taking? Authorizing Provider  amLODipine  (NORVASC ) 10 MG tablet Take 1 tablet (10 mg total) by mouth daily. Patient taking differently: Take 10 mg by mouth at bedtime. 06/10/22  Yes Parthenia Olivia HERO, PA-C  Ascorbic Acid (VITAMIN C) 1000 MG tablet Take 1,000 mg by mouth in the morning and at bedtime.   Yes [provider]  aspirin  81 MG tablet Take 1 tablet (81 mg total) by mouth daily. 09/03/10  Yes Verlin Lonni BIRCH, MD  B-D ULTRAFINE III SHORT PEN 31G X 8 MM MISC Inject 1 Syringe into the skin daily. 01/21/21  Yes [provider]  BIOTIN PO Take 1 tablet by mouth every evening.   Yes  [provider]  glucagon 1 MG injection Inject 1 mg into the vein once as needed (Low blood sugar).    Yes [provider]  insulin  glargine (LANTUS SOLOSTAR) 100 UNIT/ML Solostar Pen Inject 80 Units into the skin in the morning.   Yes [provider]  lisinopril  (ZESTRIL ) 40 MG tablet Take 1 tablet (40 mg total) by mouth daily. 06/10/22  Yes Parthenia Olivia HERO, PA-C  metFORMIN  (GLUCOPHAGE -XR) 500 MG 24 hr tablet Take 1,000 mg by mouth 2 (two) times daily.   Yes [provider]  metoprolol  succinate (TOPROL -XL) 50 MG 24 hr tablet Take with or immediately following a meal. 06/10/22  Yes Parthenia Olivia HERO, PA-C  Multiple Vitamins-Minerals (PRESERVISION AREDS 2 PO) Take 2 capsules by mouth at bedtime.   Yes [provider]  NOVOLIN R RELION 100 UNIT/ML injection USE AS DIRECTED VIA INSULIN  PUMP 02/21/18  Yes [provider]  Potassium 99 MG TABS Take 99 mg by mouth every evening.   Yes [provider]  simvastatin  (ZOCOR ) 20 MG tablet Take 1 tablet (20 mg total) by mouth daily. 06/10/22  Yes Parthenia Olivia HERO, PA-C  VITAMIN D PO Take 5,000 Units by mouth in the morning.   Yes [provider]  vitamin E 400 UNIT capsule Take 400 Units by mouth in the morning.   Yes [provider]  zinc sulfate, 50mg  elemental zinc, 220 (50 Zn) MG capsule Take 220 mg by mouth at bedtime.   Yes [provider]  albuterol  (VENTOLIN  HFA) 108 (90 Base) MCG/ACT inhaler Inhale 1-2 puffs into the lungs every 6 (six) hours as needed for wheezing or shortness of breath. 08/22/23   Minnie Tinnie BRAVO, PA  Continuous Glucose Sensor (FREESTYLE LIBRE 3 PLUS SENSOR) MISC apply to upper back of arm for 90 days change  sensor every 15 days IC-10 CODE: E11.9    [provider]  Insulin  Disposable Pump (OMNIPOD DASH PODS, GEN 4,) MISC Inject into the skin. 02/04/21   [provider]  Insulin  Human (INSULIN  PUMP) 100 unit/ml SOLN Inject into the  skin continuous. NOVOLIN R RELION 100 UNIT/ML injection; basal rate: 85-100 units per day.    [provider]  SEMAGLUTIDE, 2 MG/DOSE, [place] Inject 2 mg into the skin every Monday.    [provider]     Critical care time: 33 minutes

## 2023-09-16 ENCOUNTER — Ambulatory Visit: Admitting: Student in an Organized Health Care Education/Training Program

## 2023-09-16 DIAGNOSIS — D496 Neoplasm of unspecified behavior of brain: Secondary | ICD-10-CM

## 2023-09-16 DIAGNOSIS — Z9889 Other specified postprocedural states: Secondary | ICD-10-CM

## 2023-09-16 LAB — GLUCOSE, CAPILLARY
Glucose-Capillary: 102 mg/dL — ABNORMAL HIGH (ref 70–99)
Glucose-Capillary: 123 mg/dL — ABNORMAL HIGH (ref 70–99)
Glucose-Capillary: 132 mg/dL — ABNORMAL HIGH (ref 70–99)
Glucose-Capillary: 173 mg/dL — ABNORMAL HIGH (ref 70–99)
Glucose-Capillary: 234 mg/dL — ABNORMAL HIGH (ref 70–99)

## 2023-09-16 LAB — TRIGLYCERIDES: Triglycerides: 88 mg/dL (ref <150)

## 2023-09-16 MED ORDER — HEPARIN SODIUM (PORCINE) 5000 UNIT/ML IJ SOLN
5000.0000 [IU] | Freq: Three times a day (TID) | INTRAMUSCULAR | Status: DC
  Administered 2023-09-16 – 2023-09-17 (×3): 5000 [IU] via SUBCUTANEOUS
  Filled 2023-09-16 (×3): qty 1

## 2023-09-16 MED ORDER — INSULIN ASPART 100 UNIT/ML IJ SOLN
7.0000 [IU] | Freq: Three times a day (TID) | INTRAMUSCULAR | Status: DC
  Administered 2023-09-17: 7 [IU] via SUBCUTANEOUS

## 2023-09-16 MED ORDER — IRBESARTAN 150 MG PO TABS
150.0000 mg | ORAL_TABLET | Freq: Every day | ORAL | Status: DC
  Administered 2023-09-17: 150 mg via ORAL
  Filled 2023-09-16: qty 1

## 2023-09-16 MED ORDER — LEVETIRACETAM 500 MG PO TABS
500.0000 mg | ORAL_TABLET | Freq: Two times a day (BID) | ORAL | Status: DC
  Administered 2023-09-16 – 2023-09-17 (×2): 500 mg via ORAL
  Filled 2023-09-16 (×2): qty 1

## 2023-09-16 NOTE — PMR Pre-admission (Signed)
 PMR Admission Coordinator Pre-Admission Assessment  Patient: Joel Williams is an 73 y.o., male MRN: [id number] DOB: 06/16/1950 Height: 5' 9 (175.3 cm) Weight: 99.3 kg  Insurance Information HMO:     PPO:      PCP:      IPA:      80/20:      OTHER:  PRIMARY: Medicare part A and B      Policy#: [id number]      Subscriber: pt Benefits:  Phone #: passport one source online     Name: 7/3 Eff. Date: a 06/15/2014 and b 03/16/2016     Deduct: $1676      Out of Pocket Max: none      Life Max: none CIR: 100%      SNF: 20 days Outpatient: 80%     Co-Pay: 20% Home Health: 100%      Co-Pay: none DME: 80%     Co-Pay: 20% Providers: pt choice  SECONDARY: [employer] Medicare supplement      Policy#: [id number]     Phone#: [phone]  Financial Counselor:       Phone#:   The "Data Collection Information Summary" for patients in Inpatient Rehabilitation Facilities with attached "Privacy Act Statement-Health Care Records" was provided and verbally reviewed with: Patient and Family  Emergency Contact Information Contact Information     Name Relation Home Work Mobile   [place] Spouse [phone]  [phone]      Other Contacts   None on File    Current Medical History  Patient Admitting Diagnosis: Brain Tumor  History of Present Illness: 73 yo male with history of HTN, HLD, DM 2 with insulin  pump, asthma, CKD,CAD with NSTEMI, DES LAD, GERD and renal vein thrombosis.  Seen in th outpatient neurosurgery clinic after outpatient MRI results. Had reported unintentional weigh loss, generalized fatigue and dizziness. MRI revealed a large peripherally enhancing temporal lesion. Case was discussed at the multidisciplinary Neuro-oncology conference after systemic imaging did not reveal any evidence of primary malignancy. Presented on 09/14/23 for stereotactic craniotomy for resection of tumor.  Postop to ICU for recovery. Initially on cleviprex  infusion for BO control. DM coordinator to  assist with DM management. Not to use insulin  pump while admitted and to administer SQ basal bolus. PRN albuterol  for asthma. Continue home amlodipine , lisinopril , metoprolol  and statin for CAD history and HTN. Added hydralazine .  Keppra  postop. Initially on dexamethasone , but with absence of significant edema on postop MRI, discontinued this.   ICU stay complicated by delirium.Surgical pathology reported as high-grade glioma, WHO grade 4.   Patient's medical record from [facility] has been reviewed by the rehabilitation admission coordinator and physician.  Past Medical History  Past Medical History:  Diagnosis Date   Allergic rhinitis    Arthritis    ASTHMA 06/08/2008   Asthma    as a child   CAD (coronary artery disease)    a. s/p Promus DES (3.0 x 16 mm) to mid LAD 05/16/10;  b. Myoview  05/15/10: anteroseptal ischemia with EF 52%;   c. Cath 05/16/10: single vessel CAD with mLAD 90% tx with PCI and EF 50%;  d. 07/2011 Cath:  Patent stent, nonobs dzs, NL EF.   Chronic kidney disease    was seen by Dr. Lonna and released.   Colonic polyp    DIABETES MELLITUS, TYPE II 06/08/2008   GERD (gastroesophageal reflux disease)    Hemorrhoids    HYPERLIPIDEMIA 06/08/2008   HYPERTENSION 06/08/2008   Impotence of  organic origin 08/24/2008   Myocardial infarction (HCC) 04/16/2010   Pneumonia    hx of   Renal vein thrombosis (HCC)    Seasonal allergies    Has the patient had major surgery during 100 days prior to admission? Yes  Family History   family history includes Angina in his father; Dementia in his mother; Diabetes in his father; Heart disease in his father.  Current Medications  Current Facility-Administered Medications:    acetaminophen  (TYLENOL ) tablet 650 mg, 650 mg, Oral, Q4H PRN, 650 mg at 09/17/23 0630 **OR** acetaminophen  (TYLENOL ) suppository 650 mg, 650 mg, Rectal, Q4H PRN, Lanis Pupa, MD   albuterol  (PROVENTIL ) (2.5 MG/3ML) 0.083% nebulizer solution 3 mL, 3  mL, Inhalation, Q6H PRN, Nundkumar, Neelesh, MD   amLODipine  (NORVASC ) tablet 10 mg, 10 mg, Oral, QHS, Nundkumar, Pupa, MD, 10 mg at 09/16/23 2207   bisacodyl  (DULCOLAX) suppository 10 mg, 10 mg, Rectal, Daily PRN, Nundkumar, Neelesh, MD   Chlorhexidine  Gluconate Cloth 2 % PADS 6 each, 6 each, Topical, Daily, Nundkumar, Neelesh, MD, 6 each at 09/17/23 0837   heparin  injection 5,000 Units, 5,000 Units, Subcutaneous, Q8H, Nundkumar, Neelesh, MD, 5,000 Units at 09/17/23 0630   HYDROcodone -acetaminophen  (NORCO/VICODIN) 5-325 MG per tablet 1 tablet, 1 tablet, Oral, Q4H PRN, Nundkumar, Neelesh, MD, 1 tablet at 09/15/23 0341   insulin  aspart (novoLOG ) injection 0-20 Units, 0-20 Units, Subcutaneous, TID AC & HS, Joshi, Rutwij, MD, 3 Units at 09/17/23 9162   insulin  aspart (novoLOG ) injection 7 Units, 7 Units, Subcutaneous, TID with meals, Gonfa, Taye T, MD   [COMPLETED] insulin  glargine-yfgn (SEMGLEE ) injection 30 Units, 30 Units, Subcutaneous, Once, 30 Units at 09/14/23 2144 **FOLLOWED BY** insulin  glargine-yfgn (SEMGLEE ) injection 40 Units, 40 Units, Subcutaneous, BID, Joshi, Rutwij, MD, 40 Units at 09/17/23 9161   irbesartan  (AVAPRO ) tablet 150 mg, 150 mg, Oral, Daily, Nundkumar, Neelesh, MD, 150 mg at 09/17/23 0836   labetalol  (NORMODYNE ) injection 10-40 mg, 10-40 mg, Intravenous, Q10 min PRN, Nundkumar, Neelesh, MD, 20 mg at 09/15/23 0522   levETIRAcetam  (KEPPRA ) tablet 500 mg, 500 mg, Oral, BID, Nundkumar, Neelesh, MD, 500 mg at 09/17/23 9163   metoprolol  succinate (TOPROL -XL) 24 hr tablet 50 mg, 50 mg, Oral, Daily, Nundkumar, Neelesh, MD, 50 mg at 09/17/23 0836   morphine  (PF) 2 MG/ML injection 1-2 mg, 1-2 mg, Intravenous, Q2H PRN, Nundkumar, Neelesh, MD, 2 mg at 09/15/23 0030   multivitamin (PROSIGHT) tablet 1 tablet, 1 tablet, Oral, QHS, Nundkumar, Pupa, MD, 1 tablet at 09/16/23 2207   ondansetron  (ZOFRAN ) tablet 4 mg, 4 mg, Oral, Q4H PRN **OR** ondansetron  (ZOFRAN ) injection 4 mg, 4 mg,  Intravenous, Q4H PRN, Nundkumar, Neelesh, MD, 4 mg at 09/14/23 2113   Oral care mouth rinse, 15 mL, Mouth Rinse, PRN, Lanis Pupa, MD   pantoprazole  (PROTONIX ) EC tablet 40 mg, 40 mg, Oral, QHS, Nundkumar, Pupa, MD, 40 mg at 09/16/23 2206   potassium chloride  SA (KLOR-CON  M) CR tablet 40 mEq, 40 mEq, Oral, Q3H, Gonfa, Taye T, MD, 40 mEq at 09/17/23 0836   promethazine  (PHENERGAN ) tablet 12.5-25 mg, 12.5-25 mg, Oral, Q4H PRN, Nundkumar, Neelesh, MD, 25 mg at 09/14/23 2211   senna-docusate (Senokot-S) tablet 1 tablet, 1 tablet, Oral, QHS PRN, Nundkumar, Neelesh, MD   simvastatin  (ZOCOR ) tablet 20 mg, 20 mg, Oral, Daily, Nundkumar, Neelesh, MD, 20 mg at 09/17/23 0836  Patients Current Diet:  Diet Order             Diet - low sodium heart healthy  Diet Carb Modified Fluid consistency: Thin; Room service appropriate? Yes  Diet effective now                   Precautions / Restrictions Precautions Precautions: Fall Restrictions Weight Bearing Restrictions Per Provider Order: No   Has the patient had 2 or more falls or a fall with injury in the past year? No  Prior Activity Level Community (5-7x/wk): prior to one month ago, Independent without AD and driving  Prior Functional Level Self Care: Did the patient need help bathing, dressing, using the toilet or eating? Independent  Indoor Mobility: Did the patient need assistance with walking from room to room (with or without device)? Independent  Stairs: Did the patient need assistance with internal or external stairs (with or without device)? Independent  Functional Cognition: Did the patient need help planning regular tasks such as shopping or remembering to take medications? Independent  Patient Information Are you of Hispanic, Latino/a,or Spanish origin?: A. No, not of Hispanic, Latino/a, or Spanish origin What is your race?: A. White Do you need or want an interpreter to communicate with a doctor or health  care staff?: 0. No  Patient's Response To:  Health Literacy and Transportation Is the patient able to respond to health literacy and transportation needs?: Yes Health Literacy - How often do you need to have someone help you when you read instructions, pamphlets, or other written material from your doctor or pharmacy?: Never In the past 12 months, has lack of transportation kept you from medical appointments or from getting medications?: No In the past 12 months, has lack of transportation kept you from meetings, work, or from getting things needed for daily living?: No  Home Assistive Devices / Equipment Home Equipment: Agricultural consultant (2 wheels), Shower seat, Grab bars - tub/shower  Prior Device Use: Indicate devices/aids used by the patient prior to current illness, exacerbation or injury? None of the above  Current Functional Level Cognition  Arousal/Alertness: Awake/alert Overall Cognitive Status: Impaired/Different from baseline Orientation Level: Oriented X4    Extremity Assessment (includes Sensation/Coordination)  Upper Extremity Assessment: Right hand dominant, LUE deficits/detail (RUE ROM strength and coordination WFL.) LUE Deficits / Details: impaired FTN and FMC. ROM WFL, strenght 4/5 throughout. LUE Sensation: WNL LUE Coordination: decreased fine motor  Lower Extremity Assessment: RLE deficits/detail, LLE deficits/detail, Generalized weakness RLE Deficits / Details: tremulous throughout arms and legs; MMT scores of 4 hip flexion, 4+ knee extension, 4+ ankle dorsiflexion; sensation intact bil; incoordination noted with rapid alternating movements RLE Coordination: decreased fine motor, decreased gross motor LLE Deficits / Details: tremulous throughout arms and legs; MMT scores of 4 hip flexion, 4+ knee extension, 4+ ankle dorsiflexion; sensation intact bil; incoordination noted with rapid alternating movements LLE Coordination: decreased fine motor, decreased gross motor     ADLs  Overall ADL's : Needs assistance/impaired Eating/Feeding: Set up, Sitting Grooming: Oral care, Standing, Contact guard assist Grooming Details (indicate cue type and reason): Pt needed cues to actually apply more toothpaste, he only had a graze of paste on his brush. Upper Body Bathing: Sitting, Contact guard assist Lower Body Bathing: Sitting/lateral leans, Minimal assistance Upper Body Dressing : Sitting, Contact guard assist Lower Body Dressing: Minimal assistance, Sitting/lateral leans Lower Body Dressing Details (indicate cue type and reason): don socks Toilet Transfer: Contact guard assist, Rolling walker (2 wheels), Ambulation Toileting- Clothing Manipulation and Hygiene: Contact guard assist, Sitting/lateral lean, Sit to/from stand Functional mobility during ADLs: Contact guard assist, Rolling walker (2 wheels)  General ADL Comments: Educated pt on visual scanning and anchoring strategies to address visual deficits. Also discussed fall prevention strategies and adequate lightining with family.    Mobility  Overal bed mobility: Needs Assistance Bed Mobility: Supine to Sit Supine to sit: Mod assist, HOB elevated General bed mobility comments: Pt in recliner on arrival    Transfers  Overall transfer level: Needs assistance Equipment used: Rolling walker (2 wheels) Transfers: Sit to/from Stand Sit to Stand: Contact guard assist General transfer comment: cues needed for hand placement.    Ambulation / Gait / Stairs / Wheelchair Mobility  Ambulation/Gait Ambulation/Gait assistance: Editor, commissioning (Feet): 110 Feet Assistive device: Rolling walker (2 wheels) Gait Pattern/deviations: Step-through pattern, Decreased step length - right, Decreased step length - left, Decreased stride length, Decreased dorsiflexion - right, Decreased dorsiflexion - left, Shuffle, Trunk flexed General Gait Details: Pt takes slow, small steps with a flexed posture. VCs provided to remain  proximal to RW, stand upright, and increase bil step length and feet clearance. Momentary success noted. MinA provided for balance. Needs cues to visually scan and find people in the hall Gait velocity: reduced Gait velocity interpretation: <1.31 ft/sec, indicative of household ambulator    Posture / Balance Dynamic Sitting Balance Sitting balance - Comments: in recliner, not fully assessed Balance Overall balance assessment: Needs assistance Sitting-balance support: Feet supported, Bilateral upper extremity supported, Single extremity supported Sitting balance-Leahy Scale: Fair Sitting balance - Comments: in recliner, not fully assessed Postural control: Posterior lean Standing balance support: Bilateral upper extremity supported, During functional activity, Reliant on assistive device for balance Standing balance-Leahy Scale: Poor Standing balance comment: Reliant on RW for gait, static stand with CGA.    Special needs/care consideration Hgb A1c 7.4 On insulin  pump and Ozempic  at home Delirium precautions   Previous Home Environment  Living Arrangements: Spouse/significant other  Lives With: Spouse Available Help at Discharge: Family, Available 24 hours/day (wife, adult children and family) Type of Home: House Home Layout: One level Home Access: Stairs to enter Entrance Stairs-Rails: Right, Left Entrance Stairs-Number of Steps: 5 Bathroom Shower/Tub: Health visitor: Handicapped height Bathroom Accessibility: Yes How Accessible: Accessible via walker Home Care Services: No  Discharge Living Setting Plans for Discharge Living Setting: Patient's home, Lives with (comment) (wife) Type of Home at Discharge: House Discharge Home Layout: One level Discharge Home Access: Stairs to enter Entrance Stairs-Rails: Right, Left Entrance Stairs-Number of Steps: 5 Discharge Bathroom Shower/Tub: Walk-in shower Discharge Bathroom Toilet: Handicapped height Discharge  Bathroom Accessibility: Yes How Accessible: Accessible via walker Does the patient have any problems obtaining your medications?: No  Social/Family/Support Systems Patient Roles: Spouse, Parent 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, Buyer, retail Anticipated Caregiver: wife, children and family Anticipated Caregiver's Contact Information: see contacts Ability/Limitations of Caregiver: no limitations; retired; very involved with their church Caregiver Availability: 24/7 Discharge Plan Discussed with Primary Caregiver: Yes Is Caregiver In Agreement with Plan?: Yes Does Caregiver/Family have Issues with Lodging/Transportation while Pt is in Rehab?: Yes  Goals Patient/Family Goal for Rehab: supervision with PT, PT and SLP Expected length of stay: ELOS 10 to 14 days Pt/Family Agrees to Admission and willing to participate: Yes Program Orientation Provided & Reviewed with Pt/Caregiver Including Roles  & Responsibilities: Yes  Decrease burden of Care through IP rehab admission: n/a  Possible need for SNF placement upon discharge: not anticipated  Patient Condition: I have reviewed medical records from [facility], spoken with CM, and patient, spouse, and family member. I met with patient at  the bedside for inpatient rehabilitation assessment.  Patient will benefit from ongoing PT, OT, and SLP, can actively participate in 3 hours of therapy a day 5 days of the week, and can make measurable gains during the admission.  Patient will also benefit from the coordinated team approach during an Inpatient Acute Rehabilitation admission.  The patient will receive intensive therapy as well as Rehabilitation physician, nursing, social worker, and care management interventions.  Due to bladder management, bowel management, safety, skin/wound care, disease management, medication administration, pain management, and patient education the patient requires 24 hour a day rehabilitation nursing.  The patient is currently min  assist overall with mobility and basic ADLs.  Discharge setting and therapy post discharge at home with home health is anticipated.  Patient has agreed to participate in the Acute Inpatient Rehabilitation Program and will admit today.  Preadmission Screen Completed By:  Alison Heron Lot RN MSN 09/17/2023 11:08 AM ______________________________________________________________________   Discussed status with Dr. Lorilee on 09/17/23 at 1108 and received approval for admission today.  Admission Coordinator:  Alison Heron Lot, RN MSN time [id number] Date 09/17/23   Assessment/Plan: Diagnosis: s/p craniotomy for tumor resection Does the need for close, 24 hr/day Medical supervision in concert with the patient's rehab needs make it unreasonable for this patient to be served in a less intensive setting? Yes Co-Morbidities requiring supervision/potential complications: HTN, HLD, DM2, asthma, CKD Due to bladder management, bowel management, safety, skin/wound care, disease management, medication administration, pain management, and patient education, does the patient require 24 hr/day rehab nursing? Yes Does the patient require coordinated care of a physician, rehab nurse, PT, OT, and SLP to address physical and functional deficits in the context of the above medical diagnosis(es)? Yes Addressing deficits in the following areas: balance, endurance, locomotion, strength, transferring, bowel/bladder control, bathing, dressing, feeding, grooming, toileting, and cognition Can the patient actively participate in an intensive therapy program of at least 3 hrs of therapy 5 days a week? Yes The potential for patient to make measurable gains while on inpatient rehab is excellent Anticipated functional outcomes upon discharge from inpatient rehab: supervision PT, supervision OT, supervision SLP Estimated rehab length of stay to reach the above functional goals is: 10-14 days Anticipated discharge destination:  Home 10. Overall Rehab/Functional Prognosis: excellent   MD Signature: Sven Lorilee, MD

## 2023-09-16 NOTE — Anesthesia Postprocedure Evaluation (Signed)
 Anesthesia Post Note  Patient: Joel Williams  Procedure(s) Performed: STEREOTACTIC RIGHT TEMPRO-OCCIPITAL CRANIOTOMY FOR RESECTION OF TUMOR (Right: Head) COMPUTER-ASSISTED NAVIGATION, FOR CRANIAL PROCEDURE (Right)     Patient location during evaluation: PACU Anesthesia Type: General Level of consciousness: patient cooperative Pain management: pain level controlled Vital Signs Assessment: post-procedure vital signs reviewed and stable Respiratory status: spontaneous breathing, nonlabored ventilation, respiratory function stable and patient connected to nasal cannula oxygen Cardiovascular status: blood pressure returned to baseline and stable Postop Assessment: no apparent nausea or vomiting Anesthetic complications: no   No notable events documented.                  Ziaire Hagos

## 2023-09-16 NOTE — Progress Notes (Signed)
 Inpatient Rehab Admissions Coordinator Note:   Per therapy recommendations patient was screened for CIR candidacy by Reche FORBES Lowers, PT. At this time, pt appears to be a potential candidate for CIR. I will place an order for rehab consult for full assessment, per our protocol.  Please contact me any with questions.SABRA Reche Lowers, PT, DPT [phone] 09/16/23 11:36 AM

## 2023-09-16 NOTE — Evaluation (Signed)
 Occupational Therapy Evaluation Patient Details Name: Joel Williams MRN: [id number] DOB: 02-10-1951 Today's Date: 09/16/2023   History of Present Illness   Pt is a 73 y.o. male who presented 09/14/23 for stereotactic R temporo-occipital craniotomy for resection of large R temporal tumor. Postop MRI reviewed and demonstrates near complete resection of tumor. PMH: arthritis, asthma, CAD, CKD, DM2, GERD, HLD, HTN, MI, renal vein thrombosis     Clinical Impressions Pt admitted for above, PTA pt was very ind with ADLs and very sharp but has hit a recent decline within the last month. Pt currently presenting with listed cognitive deficits and demonstrates a L visual field cut. Provided pt and family education on field deficits and compensatory strategies to utilize. Discussed some home modifications for pt safety to reduce risk of falls. Pt currently needing min A to CGA to complete ADLs and ambulating with CGA + RW for dynamic balance. Reinforced use of vision strategies throughout session. OT to continue following pt acutely to address listed deficits and help transition to next level of care. Patient has the potential to reach Mod I and demos the ability to tolerate 3 hours of therapy. Pt would benefit from an intensive rehab program to help maximize functional independence.      If plan is discharge home, recommend the following:   A little help with bathing/dressing/bathroom;Direct supervision/assist for medications management;Direct supervision/assist for financial management;Supervision due to cognitive status;Assistance with cooking/housework;Assist for transportation     Functional Status Assessment   Patient has had a recent decline in their functional status and demonstrates the ability to make significant improvements in function in a reasonable and predictable amount of time.     Equipment Recommendations   Tub/shower seat     Recommendations for Other Services   Rehab  consult     Precautions/Restrictions   Precautions Precautions: Fall Recall of Precautions/Restrictions: Intact Restrictions Weight Bearing Restrictions Per Provider Order: No     Mobility Bed Mobility               General bed mobility comments: Pt in recliner on arrival    Transfers Overall transfer level: Needs assistance Equipment used: Rolling walker (2 wheels) Transfers: Sit to/from Stand Sit to Stand: Contact guard assist           General transfer comment: cues needed for hand placement.      Balance Overall balance assessment: Needs assistance Sitting-balance support: Feet supported, Bilateral upper extremity supported, Single extremity supported Sitting balance-Leahy Scale: Fair Sitting balance - Comments: in recliner, not fully assessed   Standing balance support: Bilateral upper extremity supported, During functional activity, Reliant on assistive device for balance Standing balance-Leahy Scale: Poor Standing balance comment: Reliant on RW for gait, static stand with CGA.                           ADL either performed or assessed with clinical judgement   ADL Overall ADL's : Needs assistance/impaired Eating/Feeding: Set up;Sitting   Grooming: Oral care;Standing;Contact guard assist Grooming Details (indicate cue type and reason): Pt needed cues to actually apply more toothpaste, he only had a graze of paste on his brush. Upper Body Bathing: Sitting;Contact guard assist   Lower Body Bathing: Sitting/lateral leans;Minimal assistance   Upper Body Dressing : Sitting;Contact guard assist   Lower Body Dressing: Minimal assistance;Sitting/lateral leans Lower Body Dressing Details (indicate cue type and reason): don socks Toilet Transfer: Contact guard assist;Rolling walker (2 wheels);Ambulation  Toileting- Clothing Manipulation and Hygiene: Contact guard assist;Sitting/lateral lean;Sit to/from stand       Functional mobility  during ADLs: Contact guard assist;Rolling walker (2 wheels) General ADL Comments: Educated pt on visual scanning and anchoring strategies to address visual deficits. Also discussed fall prevention strategies and adequate lightining with family.     Vision Baseline Vision/History: 1 Wears glasses Ability to See in Adequate Light: 0 Adequate Patient Visual Report: No change from baseline Vision Assessment?: Yes Eye Alignment: Within Functional Limits Ocular Range of Motion: Within Functional Limits Alignment/Gaze Preference: Within Defined Limits Tracking/Visual Pursuits: Able to track stimulus in all quads without difficulty Convergence: Within functional limits Visual Fields: Left visual field deficit (L eye L field cut.) Diplopia Assessment: Other (comment) (pt denies.) Depth Perception: Undershoots (inconsistent with test, undershot with L eye occluded. initially stated he saw two when he was asked to touch the fingers he saw.) Additional Comments: At one point during confrontation testing pt reported seeing double with L eye occluded. Reassessed fields then pt denied seeing two throughout all fields and accurately stated # of therapists fingers.     Perception Perception: Impaired Preception Impairment Details:  (more field cut vs inattention)     Praxis Praxis: Impaired Praxis Impairment Details: Organization     Pertinent Vitals/Pain Pain Assessment Pain Assessment: 0-10 Faces Pain Scale: Hurts little more Pain Location: R side of head/face Pain Descriptors / Indicators: Sore Pain Intervention(s): Monitored during session     Extremity/Trunk Assessment Upper Extremity Assessment Upper Extremity Assessment: Right hand dominant;LUE deficits/detail (RUE ROM strength and coordination WFL.) LUE Deficits / Details: impaired FTN and FMC. ROM WFL, strenght 4/5 throughout. LUE Sensation: WNL LUE Coordination: decreased fine motor   Lower Extremity Assessment RLE Deficits /  Details: tremulous throughout arms and legs; MMT scores of 4 hip flexion, 4+ knee extension, 4+ ankle dorsiflexion; sensation intact bil; incoordination noted with rapid alternating movements RLE Coordination: decreased fine motor;decreased gross motor LLE Deficits / Details: tremulous throughout arms and legs; MMT scores of 4 hip flexion, 4+ knee extension, 4+ ankle dorsiflexion; sensation intact bil; incoordination noted with rapid alternating movements LLE Coordination: decreased fine motor;decreased gross motor       Communication Communication Communication: No apparent difficulties   Cognition Arousal: Alert Behavior During Therapy: WFL for tasks assessed/performed Cognition: Cognition impaired     Awareness: Intellectual awareness intact, Online awareness impaired Memory impairment (select all impairments): Working memory Attention impairment (select first level of impairment): Sustained attention, Selective attention Executive functioning impairment (select all impairments): Organization                   Following commands: Impaired Following commands impaired: Follows one step commands with increased time, Follows multi-step commands inconsistently     Cueing  General Comments   Cueing Techniques: Verbal cues;Tactile cues;Visual cues  VSS RA, pt without any noted deficits in L eye at time of eval. denied double vision. During eval yesterday pt noted to have dysconjugate gaze with c/o double vision. Will continue to monitor.   Exercises     Shoulder Instructions      Home Living Family/patient expects to be discharged to:: Private residence Living Arrangements: Spouse/significant other Available Help at Discharge: Family;Available 24 hours/day (wife, adult children and family) Type of Home: House Home Access: Stairs to enter [employer] of Steps: 5 Entrance Stairs-Rails: Right;Left Home Layout: One level     Bathroom Shower/Tub: Retail banker: Handicapped height Bathroom Accessibility: Yes  How Accessible: Accessible via walker Home Equipment: Rolling Walker (2 wheels);Shower seat;Grab bars - tub/shower      Lives With: Spouse    Prior Functioning/Environment Prior Level of Function : Independent/Modified Independent;Driving             Mobility Comments: A month ago, pt was completely independent without DME; over the past month, the pt has transitioned to needing to use a RW for mobility ADLs Comments: A month ago, pt was completely independent, driving, and cognitively intact; over the past month, his cognition has gradually worsened and now is unable to drive    OT Problem List: Impaired vision/perception;Impaired balance (sitting and/or standing);Pain;Decreased coordination;Decreased cognition   OT Treatment/Interventions: Therapeutic exercise;Therapeutic activities;Visual/perceptual remediation/compensation;Patient/family education;Balance training;Self-care/ADL training      OT Goals(Current goals can be found in the care plan section)   Acute Rehab OT Goals Patient Stated Goal: to return home OT Goal Formulation: With patient Time For Goal Achievement: 09/30/23 Potential to Achieve Goals: Good ADL Goals Pt Will Perform Grooming: with supervision;standing Pt Will Perform Lower Body Dressing: with supervision;sit to/from stand Pt Will Transfer to Toilet: with supervision;ambulating Additional ADL Goal #1: Pt will utilize visual compensation strategies to locate 3/3 items on L side in prep for ADL use.   OT Frequency:  Min 2X/week    Co-evaluation              AM-PAC OT 6 Clicks Daily Activity     Outcome Measure Help from another person eating meals?: A Little Help from another person taking care of personal grooming?: A Little Help from another person toileting, which includes using toliet, bedpan, or urinal?: A Little Help from another person bathing (including  washing, rinsing, drying)?: A Little Help from another person to put on and taking off regular upper body clothing?: A Little Help from another person to put on and taking off regular lower body clothing?: A Little 6 Click Score: 18   End of Session Equipment Utilized During Treatment: Gait belt;Rolling walker (2 wheels) Nurse Communication: Mobility status  Activity Tolerance: Patient tolerated treatment well Patient left: in chair;with call bell/phone within reach;with chair alarm set;with family/visitor present  OT Visit Diagnosis: Other abnormalities of gait and mobility (R26.89);Unsteadiness on feet (R26.81);Low vision, both eyes (H54.2);Other symptoms and signs involving cognitive function;Pain Pain - Right/Left: Left Pain - part of body:  (head/face)                Time: 1304-1400 OT Time Calculation (min): 56 min Charges:  OT General Charges $OT Visit: 1 Visit OT Evaluation $OT Eval Moderate Complexity: 1 Mod OT Treatments $Self Care/Home Management : 8-22 mins $Therapeutic Activity: 23-37 mins  09/16/2023  AB, OTR/L  Acute Rehabilitation Services  Office: [phone]   Joel Williams 09/16/2023, 6:28 PM

## 2023-09-16 NOTE — Progress Notes (Signed)
 PROGRESS NOTE  Joel Williams FMW:[id number] DOB: Feb 16, 1951   PCP: [name], MD  Patient is from: Home.  Independently ambulates at baseline.  DOA: 09/14/2023 LOS: 2  Chief complaints No chief complaint on file.    Brief Narrative / Interim history: 73 year old M with PMH of IDDM on insulin  pump, CAD/CABG, asthma, CKD, HTN and HLD initially presented to PCP with complaints of dizziness, unintentional weight loss and fatigue and had an outpatient MRI that showed large right temporal lobe tumor.  After discussion with multidisciplinary neuro-oncology team, he presented for craniotomy and surgical resection on 7/1 that was done without complication.  Postoperatively, he was admitted to ICU for recovery.  ICU stay complicated by delirium.   PCCM requested TRH follow-up for medical management.  Neurosurgery remains primary.  Surgical pathology reported as high-grade glioma, WHO grade 4.   Therapy recommended CIR.  Subjective: Seen and examined earlier this morning.  No major events overnight or this morning.  No major events overnight of this morning.  Patient's wife and sister at bedside.  Patient is sleepy but wakes to voice.  He is oriented x 4.  Reports feeling better that his headache has improved and he is more stable on his feet.  Denies nausea, vomiting, chest pain, shortness of breath, GI or UTI symptoms.  Had some right cheese and jaw pain when he was eating grapes earlier this morning.  Objective: Vitals:   09/16/23 0600 09/16/23 0650 09/16/23 0736 09/16/23 1208  BP: 135/74 124/69 131/64 117/61  Pulse: 78 83 85 78  Resp: 15 18 14 16   Temp:  98.2 F (36.8 C) 97.9 F (36.6 C) 98 F (36.7 C)  TempSrc:  Oral Oral Oral  SpO2: 94% 95% 96% 93%  Weight:      Height:        Examination:  GENERAL: No apparent distress.  Nontoxic. HEENT: MMM.  Vision and hearing grossly intact.  Craniotomy wound. NECK: Supple.  No apparent JVD.  RESP:  No IWOB.  Fair aeration  bilaterally. CVS:  RRR. Heart sounds normal.  ABD/GI/GU: BS+. Abd soft, NTND.  MSK/EXT:  Moves extremities. No apparent deformity. No edema.  SKIN: Right craniotomy wound. NEURO: AA.  Oriented appropriately.  No apparent focal neuro deficit. PSYCH: Calm. Normal affect.   Consultants:  Critical care Neuro-oncology per primary  Procedures: 7/1-history of static right temporal occipital craniectomy and resection of temporal mass.  Microbiology summarized: MRSA PCR screen nonreactive  Assessment and plan: Right temporal mass/tumor:  -S/p surgical resection by Dr. Lanis on 7/1 -Surgical pathology with high-grade glioma, WHO grade 4 -Defer to primary team. -PT/OT-recommended SNF   IDDM-2 with hyperglycemia: A1c 7.4%.  On insulin  pump and Ozempic at home. Recent Labs  Lab 09/15/23 1532 09/15/23 1924 09/15/23 2134 09/16/23 0753 09/16/23 1206  GLUCAP 204* 200* 188* 173* 234*  -Continue SSI-resistant scale -Continue Semglee 40 units twice daily -Increase NovoLog  from 4 units to 7 units 3 times daily with meals -Continue holding home metformin  and Ozempic -Appreciate help by diabetic coordinator   Mild intermittent asthma: Stable - PRN albuterol    History of CAD/CABG: Stable  - Continue Avapro, metoprolol  and statin.   - Resume aspirin  when okay from surgical standpoint   Essential hypertension: Normotensive.   - Continue amlodipine , Avapro and metoprolol    Delirium: Resolved.  Class I obesity Body mass index is 32.33 kg/m. - Continue Ozempic outpatient         DVT prophylaxis:  heparin  injection 5,000 Units  Start: 09/16/23 1400 Place and maintain sequential compression device Start: 09/14/23 1632 SCDs Start: 09/14/23 1502  Code Status: Full code Family Communication: Updated patient's wife and sister at bedside Level of care: Progressive Status is: Inpatient   Final disposition: CIR   35 minutes with more than 50% spent in reviewing records,  counseling patient/family and coordinating care.   Sch Meds:  Scheduled Meds:  amLODipine   10 mg Oral QHS   Chlorhexidine  Gluconate Cloth  6 each Topical Daily   heparin  injection (subcutaneous)  5,000 Units Subcutaneous Q8H   insulin  aspart  0-20 Units Subcutaneous TID AC & HS   insulin  aspart  4 Units Subcutaneous TID with meals   insulin  glargine-yfgn  40 Units Subcutaneous BID   [START ON 09/17/2023] irbesartan  150 mg Oral Daily   levETIRAcetam  500 mg Oral BID   metoprolol  succinate  50 mg Oral Daily   multivitamin  1 tablet Oral QHS   pantoprazole  40 mg Oral QHS   simvastatin   20 mg Oral Daily   Continuous Infusions: PRN Meds:.acetaminophen  **OR** acetaminophen , albuterol , bisacodyl, HYDROcodone -acetaminophen , labetalol, morphine injection, ondansetron  **OR** ondansetron  (ZOFRAN ) IV, mouth rinse, promethazine, senna-docusate  Antimicrobials: Anti-infectives (From admission, onward)    Start     Dose/Rate Route Frequency Ordered Stop   09/14/23 1600  ceFAZolin (ANCEF) IVPB 2g/100 mL premix        2 g 200 mL/hr over 30 Minutes Intravenous Every 8 hours 09/14/23 1501 09/15/23 0552   09/14/23 0600  ceFAZolin (ANCEF) IVPB 2g/100 mL premix        2 g 200 mL/hr over 30 Minutes Intravenous On call to O.R. 09/14/23 0543 09/14/23 1203   09/14/23 0549  ceFAZolin (ANCEF) 2-4 GM/100ML-% IVPB       Note to Pharmacy: Barron Friday D: cabinet override      09/14/23 0549 09/14/23 0840        I have personally reviewed the following labs and images: CBC: Recent Labs  Lab 09/14/23 0948 09/14/23 1153 09/15/23 0900  WBC  --   --  9.2  HGB 11.9* 12.2* 13.2  HCT 35.0* 36.0* 39.4  MCV  --   --  83.5  PLT  --   --  153   BMP &GFR Recent Labs  Lab 09/14/23 0948 09/14/23 1153 09/15/23 0900  NA 144 143 136  K 4.0 3.9 3.7  CL  --   --  101  CO2  --   --  24  GLUCOSE  --   --  255*  BUN  --   --  11  CREATININE  --   --  0.68  CALCIUM  --   --  8.9  MG  --   --  1.5*   PHOS  --   --  3.1   Estimated Creatinine Clearance: 94.1 mL/min (by C-G formula based on SCr of 0.68 mg/dL). Liver & Pancreas: Recent Labs  Lab 09/15/23 0900  AST 21  ALT 17  ALKPHOS 42  BILITOT 0.8  PROT 6.2*  ALBUMIN 3.3*   No results for input(s): LIPASE, AMYLASE in the last 168 hours. No results for input(s): AMMONIA in the last 168 hours. Diabetic: Recent Labs    09/14/23 1542  HGBA1C 7.4*   Recent Labs  Lab 09/15/23 1532 09/15/23 1924 09/15/23 2134 09/16/23 0753 09/16/23 1206  GLUCAP 204* 200* 188* 173* 234*   Cardiac Enzymes: No results for input(s): CKTOTAL, CKMB, CKMBINDEX, TROPONINI in the last 168 hours. No results  for input(s): PROBNP in the last 8760 hours. Coagulation Profile: No results for input(s): INR, PROTIME in the last 168 hours. Thyroid  Function Tests: No results for input(s): TSH, T4TOTAL, FREET4, T3FREE, THYROIDAB in the last 72 hours. Lipid Profile: Recent Labs    09/16/23 0527  TRIG 88   Anemia Panel: No results for input(s): VITAMINB12, FOLATE, FERRITIN, TIBC, IRON, RETICCTPCT in the last 72 hours. Urine analysis:    Component Value Date/Time   COLORURINE AMBER (A) 08/22/2023 1153   APPEARANCEUR HAZY (A) 08/22/2023 1153   LABSPEC 1.020 08/22/2023 1153   PHURINE 5.0 08/22/2023 1153   GLUCOSEU 50 (A) 08/22/2023 1153   GLUCOSEU 500 (?) 08/23/2008 1515   HGBUR NEGATIVE 08/22/2023 1153   HGBUR negative 06/10/2009 0903   BILIRUBINUR NEGATIVE 08/22/2023 1153   BILIRUBINUR neg 10/02/2011 1133   KETONESUR NEGATIVE 08/22/2023 1153   PROTEINUR 30 (A) 08/22/2023 1153   UROBILINOGEN 0.2 10/02/2011 1133   UROBILINOGEN 0.2 06/10/2009 0903   NITRITE NEGATIVE 08/22/2023 1153   LEUKOCYTESUR TRACE (A) 08/22/2023 1153   Sepsis Labs: Invalid input(s): PROCALCITONIN, LACTICIDVEN  Microbiology: Recent Results (from the past 240 hours)  MRSA Next Gen by PCR, Nasal     Status: None   Collection  Time: 09/14/23  3:03 PM   Specimen: Nasal Mucosa; Nasal Swab  Result Value Ref Range Status   MRSA by PCR Next Gen NOT DETECTED NOT DETECTED Final    Comment: (NOTE) The GeneXpert MRSA Assay (FDA approved for NASAL specimens only), is one component of a comprehensive MRSA colonization surveillance program. It is not intended to diagnose MRSA infection nor to guide or monitor treatment for MRSA infections. Test performance is not FDA approved in patients less than 35 years old. Performed at [facility], [address], [place], [place] [address]     Radiology Studies: No results found.    Sharisa Toves T. Tobie Perdue [facility]  If 7PM-7AM, please contact night-coverage [email or web address] 09/16/2023, 12:09 PM

## 2023-09-16 NOTE — Progress Notes (Addendum)
  Inpatient Rehabilitation Admissions Coordinator   Met with patient, wife and sister at bedside for rehab assessment. We discussed goals and expectations of a possible CIR admit. They prefer CIR for rehab. Family can provide expected caregiver support that is recommended. I await medical readiness to admit to CIR. I will follow up tomorrow. Please call me with any questions.   Heron Leavell, RN, MSN Rehab Admissions Coordinator [phone]    I notified Dr Lanis that we can admit when medically ready. He states Dr Colon to cover him tomorrow.  Heron Leavell, RN, MSN Rehab Admissions Coordinator [phone] 09/16/2023 6:23 PM

## 2023-09-16 NOTE — TOC CM/SW Note (Signed)
 Transition of Care [facility]) - Inpatient Brief Assessment   Patient Details  Name: Joel Williams MRN: [id number] Date of Birth: 08/30/1950  Transition of Care [facility]) CM/SW Contact:    Andrez JULIANNA George, RN Phone Number: 09/16/2023, 2:40 PM   Clinical Narrative:  Pt is s/p: STEREOTACTIC RIGHT TEMPRO-OCCIPITAL CRANIOTOMY FOR RESECTION OF TUMOR (Right: Head) COMPUTER-ASSISTED NAVIGATION, FOR CRANIAL PROCEDURE CIR awaiting medical readiness to admit.  TOC following.  Transition of Care Asessment: Insurance and Status: Insurance coverage has been reviewed Patient has primary care physician: Yes Home environment has been reviewed: home with spouse   Prior/Current Home Services: No current home services Social Drivers of Health Review: SDOH reviewed no interventions necessary Readmission risk has been reviewed: Yes Transition of care needs: transition of care needs identified, TOC will continue to follow

## 2023-09-16 NOTE — Progress Notes (Signed)
  NEUROSURGERY PROGRESS NOTE   No issues overnight. Pt does cont to c/o some HA this am but better than yesterday.  EXAM:  BP 131/64 (BP Location: Left Arm)   Pulse 85   Temp 97.9 F (36.6 C) (Oral)   Resp 14   Ht 5' 9 (1.753 m)   Wt 99.3 kg   SpO2 96%   BMI 32.33 kg/m   Awake, alert, oriented  Speech fluent MAE well, good strength. Has RUE > LUE tremor Headwrap in place, c/d/I  IMAGING: Postop MRI reviewed and demonstrates near complete resection of tumor. Wispy enhancement seen along the posterior resection cavity. Posterior satellite lesion unchanged. Enhancement of the right lateral ventricle is no longer seen.  IMPRESSION:  73 y.o. male POD#2 right temporo-occipital crani for resection of tumor, doing well.  Pathology c/w WHO grade 4 glioma, genetic testing pending  PLAN: - cont to mobilize - Cont Postop keppra  - dispo planning, may be CIR candidate - Will speak with neuro-oncology today   Gerldine Maizes, MD [facility]

## 2023-09-16 NOTE — Evaluation (Signed)
 Speech Language Pathology Evaluation Patient Details Name: Joel Williams MRN: [id number] DOB: 10-12-1950 Today's Date: 09/16/2023 Time: 9089-9056 SLP Time Calculation (min) (ACUTE ONLY): 33 min  Problem List:  Patient Active Problem List   Diagnosis Date Noted   Status post craniotomy 09/14/2023   Brain tumor (HCC) 09/14/2023   Headache 08/23/2023   Lower urinary tract symptoms (LUTS) 08/19/2023   Obesity with body mass index (BMI) of 30.0 to 39.9 07/08/2017   Chronic diastolic CHF (congestive heart failure) (HCC) 07/07/2017   CAD (coronary artery disease)    Unintentional weight loss 08/23/2008   History of colonic polyps 08/23/2008   Diabetes mellitus without complication (HCC) 06/08/2008   Hyperlipidemia 06/08/2008   Essential hypertension 06/08/2008   Asthma 06/08/2008   Past Medical History:  Past Medical History:  Diagnosis Date   Allergic rhinitis    Arthritis    ASTHMA 06/08/2008   Asthma    as a child   CAD (coronary artery disease)    a. s/p Promus DES (3.0 x 16 mm) to mid LAD 05/16/10;  b. Myoview  05/15/10: anteroseptal ischemia with EF 52%;   c. Cath 05/16/10: single vessel CAD with mLAD 90% tx with PCI and EF 50%;  d. 07/2011 Cath:  Patent stent, nonobs dzs, NL EF.   Chronic kidney disease    was seen by Dr. Lonna and released.   Colonic polyp    DIABETES MELLITUS, TYPE II 06/08/2008   GERD (gastroesophageal reflux disease)    Hemorrhoids    HYPERLIPIDEMIA 06/08/2008   HYPERTENSION 06/08/2008   Impotence of organic origin 08/24/2008   Myocardial infarction (HCC) 04/16/2010   Pneumonia    hx of   Renal vein thrombosis (HCC)    Seasonal allergies    Past Surgical History:  Past Surgical History:  Procedure Laterality Date   APPLICATION OF CRANIAL NAVIGATION Right 09/14/2023   Procedure: COMPUTER-ASSISTED NAVIGATION, FOR CRANIAL PROCEDURE;  Surgeon: Lanis Pupa, MD;  Location: [facility] OR;  Service: Neurosurgery;  Laterality: Right;   BACK SURGERY  2011    CATARACT EXTRACTION Bilateral    2024   CORONARY ANGIOPLASTY WITH STENT PLACEMENT     CRANIOTOMY Right 09/14/2023   Procedure: STEREOTACTIC RIGHT TEMPRO-OCCIPITAL CRANIOTOMY FOR RESECTION OF TUMOR;  Surgeon: Lanis Pupa, MD;  Location: [facility] OR;  Service: Neurosurgery;  Laterality: Right;   LEFT HEART CATHETERIZATION WITH CORONARY ANGIOGRAM N/A 07/29/2011   Procedure: LEFT HEART CATHETERIZATION WITH CORONARY ANGIOGRAM;  Surgeon: Lonni JONETTA Cash, MD;  Location: [facility] CATH LAB;  Service: Cardiovascular;  Laterality: N/A;   LEFT HEART CATHETERIZATION WITH CORONARY ANGIOGRAM N/A 06/14/2013   Procedure: LEFT HEART CATHETERIZATION WITH CORONARY ANGIOGRAM;  Surgeon: Lonni JONETTA Cash, MD;  Location: [facility] CATH LAB;  Service: Cardiovascular;  Laterality: N/A;   RADIOLOGY WITH ANESTHESIA N/A 06/09/2022   Procedure: MRI WITH ANESTHESIA OF LUMBAR SPINE WITHOUT CONTRAST;  Surgeon: Radiologist, Medication, MD;  Location: [facility] OR;  Service: Radiology;  Laterality: N/A;   HPI:  73yo male admitted 09/14/23 with unintentional weight loss, generalized fatigue, dizziness. MRI revealed a large (3.5cm) posterior right peripherally enhancing temporal lesion. Decision made to proceed with stereotactic craniotomy for resection, completed 09/14/23. PMH: arthritis, asthma, CAD, CKD, DM2, GERD, HLD, HTN, MI.   Assessment / Plan / Recommendation Clinical Impression  Pt seen for cognitive linguistic evaluation at bedside. Pt was awake and alert. Wife and sister present during assessment. Pt reports 2 years at [employer], independent with finances and medications prior to admit. He is retired from the  [employer]. Pt speech is fully intelligible. Receptive and Expressive Language are intact. Pt exhibits left inattention on reading comprehension task. Pt scored 21/30 on the [employer] Mental Status (SLUMS) Examination, indicating mild  neurocognitive deficits. Pt exhibited difficulty with immediate and  delayed recall, attention, digit reversal (working memory), auditory attention/recall, and clock drawing (executive functions).   Recommend continued skilled ST intervention acutely, and at next venue of care to maximize independence and safety and minimize caregiver burden. Pt/family and medical team informed of results and recommendations.    SLP Assessment  SLP Recommendation/Assessment: Patient needs continued Speech Language Pathology Services SLP Visit Diagnosis: Cognitive communication deficit (R41.841)     Assistance Recommended at Discharge  Frequent or constant Supervision/Assistance  Functional Status Assessment Patient has had a recent decline in their functional status and demonstrates the ability to make significant improvements in function in a reasonable and predictable amount of time.   Frequency and Duration min 1 x/week  2 weeks      SLP Evaluation Cognition  Overall Cognitive Status: Impaired/Different from baseline Arousal/Alertness: Awake/alert Orientation Level: Oriented X4       Comprehension  Auditory Comprehension Overall Auditory Comprehension: Appears within functional limits for tasks assessed Interfering Components: Attention;Hearing;Visual impairments;Working [employer] fast streamer;Anxiety EffectiveTechniques: Extra processing time;Increased volume Visual Recognition/Discrimination Discrimination: Exceptions to [facility] Reading Comprehension Reading Status: Impaired Sentence Level: Impaired (Left inattention. Able to follow written direction given multimodal cue to attend to the left.) Interfering Components: Attention;Visual scanning;Working [employer] fast streamer;Left neglect/inattention    Expression Expression Primary Mode of Expression: Verbal Verbal Expression Overall Verbal Expression: Appears within functional limits for tasks assessed Written Expression Dominant Hand: Right Written Expression: Not tested   Oral / Motor  Oral Motor/Sensory Function Overall Oral  Motor/Sensory Function: Within functional limits Motor Speech Overall Motor Speech: Appears within functional limits for tasks assessed Intelligibility: Intelligible           Daiton Cowles B. Dory, MSP, CCC-SLP Speech Language Pathologist Office: [phone]  Dory Caprice Daring 09/16/2023, 9:57 AM

## 2023-09-16 NOTE — Plan of Care (Signed)
   Problem: Education: Goal: Knowledge of General Education information will improve Description Including pain rating scale, medication(s)/side effects and non-pharmacologic comfort measures Outcome: Progressing   Problem: Clinical Measurements: Goal: Ability to maintain clinical measurements within normal limits will improve Outcome: Progressing   Problem: Activity: Goal: Risk for activity intolerance will decrease Outcome: Progressing

## 2023-09-17 ENCOUNTER — Other Ambulatory Visit: Payer: Self-pay

## 2023-09-17 ENCOUNTER — Inpatient Hospital Stay (HOSPITAL_COMMUNITY)
Admission: EM | Admit: 2023-09-17 | Discharge: 2023-09-25 | DRG: 945 | Disposition: A | Discharge status: Home / Self Care | Source: Intra-hospital | Attending: Physical Medicine and Rehabilitation | Admitting: Physical Medicine and Rehabilitation

## 2023-09-17 ENCOUNTER — Encounter (HOSPITAL_COMMUNITY): Payer: Self-pay | Admitting: Physical Medicine and Rehabilitation

## 2023-09-17 DIAGNOSIS — Z6832 Body mass index (BMI) 32.0-32.9, adult: Secondary | ICD-10-CM

## 2023-09-17 DIAGNOSIS — E785 Hyperlipidemia, unspecified: Secondary | ICD-10-CM | POA: Diagnosis present

## 2023-09-17 DIAGNOSIS — Z8249 Family history of ischemic heart disease and other diseases of the circulatory system: Secondary | ICD-10-CM | POA: Diagnosis not present

## 2023-09-17 DIAGNOSIS — C719 Malignant neoplasm of brain, unspecified: Principal | ICD-10-CM | POA: Diagnosis present

## 2023-09-17 DIAGNOSIS — J45909 Unspecified asthma, uncomplicated: Secondary | ICD-10-CM | POA: Diagnosis present

## 2023-09-17 DIAGNOSIS — E119 Type 2 diabetes mellitus without complications: Secondary | ICD-10-CM | POA: Diagnosis present

## 2023-09-17 DIAGNOSIS — Z79899 Other long term (current) drug therapy: Secondary | ICD-10-CM

## 2023-09-17 DIAGNOSIS — D496 Neoplasm of unspecified behavior of brain: Secondary | ICD-10-CM | POA: Diagnosis not present

## 2023-09-17 DIAGNOSIS — I615 Nontraumatic intracerebral hemorrhage, intraventricular: Secondary | ICD-10-CM | POA: Diagnosis not present

## 2023-09-17 DIAGNOSIS — R739 Hyperglycemia, unspecified: Secondary | ICD-10-CM | POA: Diagnosis not present

## 2023-09-17 DIAGNOSIS — Z87891 Personal history of nicotine dependence: Secondary | ICD-10-CM | POA: Diagnosis not present

## 2023-09-17 DIAGNOSIS — K219 Gastro-esophageal reflux disease without esophagitis: Secondary | ICD-10-CM | POA: Diagnosis present

## 2023-09-17 DIAGNOSIS — D62 Acute posthemorrhagic anemia: Secondary | ICD-10-CM | POA: Diagnosis not present

## 2023-09-17 DIAGNOSIS — Z9889 Other specified postprocedural states: Secondary | ICD-10-CM

## 2023-09-17 DIAGNOSIS — E876 Hypokalemia: Secondary | ICD-10-CM | POA: Diagnosis not present

## 2023-09-17 DIAGNOSIS — Z955 Presence of coronary angioplasty implant and graft: Secondary | ICD-10-CM | POA: Diagnosis not present

## 2023-09-17 DIAGNOSIS — I252 Old myocardial infarction: Secondary | ICD-10-CM | POA: Diagnosis not present

## 2023-09-17 DIAGNOSIS — I10 Essential (primary) hypertension: Secondary | ICD-10-CM | POA: Diagnosis not present

## 2023-09-17 DIAGNOSIS — R112 Nausea with vomiting, unspecified: Secondary | ICD-10-CM | POA: Diagnosis not present

## 2023-09-17 DIAGNOSIS — Z7984 Long term (current) use of oral hypoglycemic drugs: Secondary | ICD-10-CM

## 2023-09-17 DIAGNOSIS — Z794 Long term (current) use of insulin: Secondary | ICD-10-CM

## 2023-09-17 DIAGNOSIS — K5901 Slow transit constipation: Secondary | ICD-10-CM | POA: Diagnosis not present

## 2023-09-17 DIAGNOSIS — Z7982 Long term (current) use of aspirin: Secondary | ICD-10-CM

## 2023-09-17 DIAGNOSIS — G4489 Other headache syndrome: Secondary | ICD-10-CM | POA: Diagnosis not present

## 2023-09-17 DIAGNOSIS — R5381 Other malaise: Secondary | ICD-10-CM | POA: Diagnosis present

## 2023-09-17 DIAGNOSIS — T380X5A Adverse effect of glucocorticoids and synthetic analogues, initial encounter: Secondary | ICD-10-CM | POA: Diagnosis present

## 2023-09-17 DIAGNOSIS — E66811 Obesity, class 1: Secondary | ICD-10-CM | POA: Diagnosis present

## 2023-09-17 DIAGNOSIS — C712 Malignant neoplasm of temporal lobe: Principal | ICD-10-CM

## 2023-09-17 DIAGNOSIS — G9389 Other specified disorders of brain: Secondary | ICD-10-CM | POA: Diagnosis not present

## 2023-09-17 DIAGNOSIS — Z86718 Personal history of other venous thrombosis and embolism: Secondary | ICD-10-CM

## 2023-09-17 DIAGNOSIS — I2510 Atherosclerotic heart disease of native coronary artery without angina pectoris: Secondary | ICD-10-CM | POA: Diagnosis present

## 2023-09-17 DIAGNOSIS — Z833 Family history of diabetes mellitus: Secondary | ICD-10-CM

## 2023-09-17 LAB — CBC
HCT: 40.8 % (ref 39.0–52.0)
HCT: 38.3 % — ABNORMAL LOW (ref 39.0–52.0)
Hemoglobin: 13.5 g/dL (ref 13.0–17.0)
Hemoglobin: 12.9 g/dL — ABNORMAL LOW (ref 13.0–17.0)
MCH: 27.7 pg (ref 26.0–34.0)
MCH: 28.2 pg (ref 26.0–34.0)
MCHC: 33.1 g/dL (ref 30.0–36.0)
MCHC: 33.7 g/dL (ref 30.0–36.0)
MCV: 83.8 fL (ref 80.0–100.0)
MCV: 83.8 fL (ref 80.0–100.0)
Platelets: 167 K/uL (ref 150–400)
Platelets: 174 K/uL (ref 150–400)
RBC: 4.87 MIL/uL (ref 4.22–5.81)
RBC: 4.57 MIL/uL (ref 4.22–5.81)
RDW: 14.1 % (ref 11.5–15.5)
RDW: 14.1 % (ref 11.5–15.5)
WBC: 7.3 K/uL (ref 4.0–10.5)
WBC: 5.9 K/uL (ref 4.0–10.5)
nRBC: 0 % (ref 0.0–0.2)
nRBC: 0 % (ref 0.0–0.2)

## 2023-09-17 LAB — RENAL FUNCTION PANEL
Albumin: 2.9 g/dL — ABNORMAL LOW (ref 3.5–5.0)
Anion gap: 8 (ref 5–15)
BUN: 13 mg/dL (ref 8–23)
CO2: 29 mmol/L (ref 22–32)
Calcium: 8.6 mg/dL — ABNORMAL LOW (ref 8.9–10.3)
Chloride: 101 mmol/L (ref 98–111)
Creatinine, Ser: 0.74 mg/dL (ref 0.61–1.24)
GFR, Estimated: >60 mL/min (ref >60)
Glucose, Bld: 103 mg/dL — ABNORMAL HIGH (ref 70–99)
Phosphorus: 2.9 mg/dL (ref 2.5–4.6)
Potassium: 2.9 mmol/L — ABNORMAL LOW (ref 3.5–5.1)
Sodium: 138 mmol/L (ref 135–145)

## 2023-09-17 LAB — GLUCOSE, CAPILLARY
Glucose-Capillary: 106 mg/dL — ABNORMAL HIGH (ref 70–99)
Glucose-Capillary: 126 mg/dL — ABNORMAL HIGH (ref 70–99)
Glucose-Capillary: 205 mg/dL — ABNORMAL HIGH (ref 70–99)
Glucose-Capillary: 109 mg/dL — ABNORMAL HIGH (ref 70–99)
Glucose-Capillary: 81 mg/dL (ref 70–99)

## 2023-09-17 LAB — MAGNESIUM: Magnesium: 1.9 mg/dL (ref 1.7–2.4)

## 2023-09-17 LAB — CREATININE, SERUM
Creatinine, Ser: 0.75 mg/dL (ref 0.61–1.24)
GFR, Estimated: >60 mL/min (ref >60)

## 2023-09-17 MED ORDER — ACETAMINOPHEN 325 MG PO TABS
650.0000 mg | ORAL_TABLET | ORAL | Status: DC | PRN
  Administered 2023-09-17 – 2023-09-18 (×3): 650 mg via ORAL
  Filled 2023-09-17 (×4): qty 2

## 2023-09-17 MED ORDER — AMLODIPINE BESYLATE 10 MG PO TABS
10.0000 mg | ORAL_TABLET | Freq: Every day | ORAL | Status: DC
  Administered 2023-09-17 – 2023-09-24 (×8): 10 mg via ORAL
  Filled 2023-09-17 (×8): qty 1

## 2023-09-17 MED ORDER — INSULIN GLARGINE-YFGN 100 UNIT/ML ~~LOC~~ SOLN: 40.0000 [IU] | Freq: Two times a day (BID) | SUBCUTANEOUS | Status: DC

## 2023-09-17 MED ORDER — POTASSIUM CHLORIDE CRYS ER 20 MEQ PO TBCR
40.0000 meq | EXTENDED_RELEASE_TABLET | ORAL | Status: DC
  Administered 2023-09-17 (×2): 40 meq via ORAL
  Filled 2023-09-17 (×2): qty 2

## 2023-09-17 MED ORDER — ONDANSETRON HCL 4 MG PO TABS
4.0000 mg | ORAL_TABLET | ORAL | Status: DC | PRN
  Administered 2023-09-19: 4 mg via ORAL
  Filled 2023-09-17: qty 1

## 2023-09-17 MED ORDER — PANTOPRAZOLE SODIUM 40 MG PO TBEC
40.0000 mg | DELAYED_RELEASE_TABLET | Freq: Every day | ORAL | Status: DC
  Administered 2023-09-17 – 2023-09-24 (×8): 40 mg via ORAL
  Filled 2023-09-17 (×8): qty 1

## 2023-09-17 MED ORDER — INSULIN ASPART 100 UNIT/ML IJ SOLN: 5.0000 [IU] | Freq: Three times a day (TID) | INTRAMUSCULAR | Status: AC

## 2023-09-17 MED ORDER — INSULIN ASPART 100 UNIT/ML IJ SOLN: 0.0000 [IU] | Freq: Three times a day (TID) | INTRAMUSCULAR | Status: AC

## 2023-09-17 MED ORDER — INSULIN ASPART 100 UNIT/ML IJ SOLN
5.0000 [IU] | Freq: Three times a day (TID) | INTRAMUSCULAR | Status: DC
  Administered 2023-09-18 – 2023-09-23 (×16): 5 [IU] via SUBCUTANEOUS

## 2023-09-17 MED ORDER — SIMVASTATIN 20 MG PO TABS
20.0000 mg | ORAL_TABLET | Freq: Every day | ORAL | Status: DC
  Administered 2023-09-18 – 2023-09-25 (×7): 20 mg via ORAL
  Filled 2023-09-17 (×8): qty 1

## 2023-09-17 MED ORDER — ACETAMINOPHEN 650 MG RE SUPP: 650.0000 mg | RECTAL | Status: DC | PRN

## 2023-09-17 MED ORDER — SENNOSIDES-DOCUSATE SODIUM 8.6-50 MG PO TABS: 1.0000 | ORAL_TABLET | Freq: Two times a day (BID) | ORAL | Status: DC | PRN

## 2023-09-17 MED ORDER — ALBUTEROL SULFATE (2.5 MG/3ML) 0.083% IN NEBU: 3.0000 mL | INHALATION_SOLUTION | Freq: Four times a day (QID) | RESPIRATORY_TRACT | Status: DC | PRN

## 2023-09-17 MED ORDER — ONDANSETRON HCL 4 MG/2ML IJ SOLN
4.0000 mg | INTRAMUSCULAR | Status: DC | PRN
  Administered 2023-09-19: 4 mg via INTRAVENOUS
  Filled 2023-09-17: qty 2

## 2023-09-17 MED ORDER — POTASSIUM CHLORIDE CRYS ER 20 MEQ PO TBCR
40.0000 meq | EXTENDED_RELEASE_TABLET | ORAL | Status: AC
  Administered 2023-09-17: 40 meq via ORAL
  Filled 2023-09-17: qty 2

## 2023-09-17 MED ORDER — INSULIN GLARGINE-YFGN 100 UNIT/ML ~~LOC~~ SOLN
40.0000 [IU] | Freq: Two times a day (BID) | SUBCUTANEOUS | Status: DC
  Administered 2023-09-17 – 2023-09-23 (×12): 40 [IU] via SUBCUTANEOUS
  Filled 2023-09-17 (×14): qty 0.4

## 2023-09-17 MED ORDER — HEPARIN SODIUM (PORCINE) 5000 UNIT/ML IJ SOLN
5000.0000 [IU] | Freq: Three times a day (TID) | INTRAMUSCULAR | Status: DC
  Administered 2023-09-17 – 2023-09-23 (×18): 5000 [IU] via SUBCUTANEOUS
  Filled 2023-09-17 (×18): qty 1

## 2023-09-17 MED ORDER — PROSIGHT PO TABS
1.0000 | ORAL_TABLET | Freq: Every day | ORAL | Status: DC
  Administered 2023-09-17 – 2023-09-24 (×7): 1 via ORAL
  Filled 2023-09-17 (×8): qty 1

## 2023-09-17 MED ORDER — HEPARIN SODIUM (PORCINE) 5000 UNIT/ML IJ SOLN: 5000.0000 [IU] | Freq: Three times a day (TID) | INTRAMUSCULAR | Status: DC

## 2023-09-17 MED ORDER — IRBESARTAN 75 MG PO TABS
150.0000 mg | ORAL_TABLET | Freq: Every day | ORAL | Status: DC
  Administered 2023-09-18 – 2023-09-25 (×7): 150 mg via ORAL
  Filled 2023-09-17 (×8): qty 2

## 2023-09-17 MED ORDER — LEVETIRACETAM 500 MG PO TABS
500.0000 mg | ORAL_TABLET | Freq: Two times a day (BID) | ORAL | Status: DC
  Administered 2023-09-17 – 2023-09-25 (×15): 500 mg via ORAL
  Filled 2023-09-17 (×17): qty 1

## 2023-09-17 MED ORDER — SENNOSIDES-DOCUSATE SODIUM 8.6-50 MG PO TABS
1.0000 | ORAL_TABLET | Freq: Every evening | ORAL | Status: DC | PRN
  Administered 2023-09-20: 1 via ORAL
  Filled 2023-09-17: qty 1

## 2023-09-17 MED ORDER — METOPROLOL SUCCINATE ER 50 MG PO TB24
50.0000 mg | ORAL_TABLET | Freq: Every day | ORAL | Status: DC
  Administered 2023-09-18 – 2023-09-25 (×7): 50 mg via ORAL
  Filled 2023-09-17 (×8): qty 1

## 2023-09-17 MED ORDER — HYDROCODONE-ACETAMINOPHEN 5-325 MG PO TABS
1.0000 | ORAL_TABLET | ORAL | Status: DC | PRN
  Administered 2023-09-18 – 2023-09-19 (×6): 1 via ORAL
  Filled 2023-09-17 (×6): qty 1

## 2023-09-17 MED ORDER — INSULIN ASPART 100 UNIT/ML IJ SOLN
0.0000 [IU] | Freq: Three times a day (TID) | INTRAMUSCULAR | Status: DC
  Administered 2023-09-18: 7 [IU] via SUBCUTANEOUS
  Administered 2023-09-18 – 2023-09-19 (×2): 4 [IU] via SUBCUTANEOUS
  Administered 2023-09-19: 7 [IU] via SUBCUTANEOUS
  Administered 2023-09-19: 3 [IU] via SUBCUTANEOUS
  Administered 2023-09-20 (×3): 4 [IU] via SUBCUTANEOUS
  Administered 2023-09-20: 7 [IU] via SUBCUTANEOUS
  Administered 2023-09-21: 3 [IU] via SUBCUTANEOUS
  Administered 2023-09-21 (×2): 4 [IU] via SUBCUTANEOUS
  Administered 2023-09-22 (×2): 3 [IU] via SUBCUTANEOUS
  Administered 2023-09-23: 4 [IU] via SUBCUTANEOUS
  Administered 2023-09-23 – 2023-09-24 (×3): 3 [IU] via SUBCUTANEOUS
  Administered 2023-09-25: 4 [IU] via SUBCUTANEOUS

## 2023-09-17 NOTE — TOC Transition Note (Signed)
 Transition of Care [facility]) - Discharge Note   Patient Details  Name: Joel Williams MRN: [id number] Date of Birth: 30-Nov-1950  Transition [facility] Care [facility] CM/SW Contact:  Andrez JULIANNA George, RN Phone Number: 09/17/2023, 10:23 AM   Clinical Narrative:     Pt is discharging to CIR today. TOC is signing off.   Final next level of care: IP Rehab Facility Barriers to Discharge: No Barriers Identified   Patient Goals and CMS Choice   CMS Medicare.gov Compare Post Acute Care list provided to:: Patient Choice offered to / list presented to : Patient, Spouse      Discharge Placement                       Discharge Plan and Services Additional resources added to the After Visit Summary for                                       Social Drivers of Health (SDOH) Interventions SDOH Screenings   Food Insecurity: No Food Insecurity (09/14/2023)  Housing: Low Risk  (09/14/2023)  Transportation Needs: No Transportation Needs (09/14/2023)  Utilities: Not At Risk (09/14/2023)  Alcohol Screen: Low Risk  (12/22/2021)  Depression (PHQ2-9): High Risk (08/19/2023)  Financial Resource Strain: Low Risk  (12/25/2022)  Physical Activity: Insufficiently Active (12/25/2022)  Social Connections: Socially Integrated (09/14/2023)  Stress: No Stress Concern Present (12/25/2022)  Tobacco Use: Medium Risk (09/14/2023)  Health Literacy: Adequate Health Literacy (12/25/2022)     Readmission Risk Interventions     No data to display

## 2023-09-17 NOTE — Progress Notes (Signed)
   Inpatient Rehabilitation Admissions Coordinator   I have CIR bed to admit him today. I met with patient and wife at bedside. Acute team and TOC made aware. I will make the  arrangements.  Heron Leavell, RN, MSN Rehab Admissions Coordinator [phone] 09/17/2023 11:07 AM

## 2023-09-17 NOTE — Progress Notes (Signed)
 Joel Sven SQUIBB, MD  Physician Physical Medicine and Rehabilitation   PMR Pre-admission    Signed   Date of Service: 09/16/2023  4:34 PM  Related encounter: Admission (Discharged) from 09/14/2023 in [facility]   Signed     Expand All Collapse All  Show:Clear all [x] Written[x] Templated[] Copied  Added by: [x] Alison Heron MATSU, RN[x] Raulkar, Sven SQUIBB, MD  [] Hover for details PMR Admission Coordinator Pre-Admission Assessment   Patient: Joel Williams is an 73 y.o., male MRN: [id number] DOB: [birth year] Height: 5' 9 (175.3 cm) Weight: 99.3 kg   Insurance Information HMO:     PPO:      PCP:      IPA:      80/20:      OTHER:  PRIMARY: Medicare part A and B      Policy#: [id number]      Subscriber: pt Benefits:  Phone #: passport one source online     Name: 7/3 Eff. Date: a 06/15/2014 and b 03/16/2016     Deduct: $1676      Out of Pocket Max: none      Life Max: none CIR: 100%      SNF: 20 days Outpatient: 80%     Co-Pay: 20% Home Health: 100%      Co-Pay: none DME: 80%     Co-Pay: 20% Providers: pt choice  SECONDARY: [employer] Medicare supplement      Policy#: [id number]     Phone#: [phone]   Financial Counselor:       Phone#:    The "Data Collection Information Summary" for patients in Inpatient Rehabilitation Facilities with attached "Privacy Act Statement-Health Care Records" was provided and verbally reviewed with: Patient and Family   Emergency Contact Information Contact Information       Name Relation Home Work Mobile    [place] Spouse [phone]   [phone]         Other Contacts   None on File      Current Medical History  Patient Admitting Diagnosis: Brain Tumor   History of Present Illness: 73 yo male with history of HTN, HLD, DM 2 with insulin  pump, asthma, CKD,CAD with NSTEMI, DES LAD, GERD and renal vein thrombosis.  Seen in th outpatient neurosurgery clinic after outpatient MRI results. Had reported  unintentional weigh loss, generalized fatigue and dizziness. MRI revealed a large peripherally enhancing temporal lesion. Case was discussed at the multidisciplinary Neuro-oncology conference after systemic imaging did not reveal any evidence of primary malignancy. Presented on 09/14/23 for stereotactic craniotomy for resection of tumor.   Postop to ICU for recovery. Initially on cleviprex  infusion for BO control. DM coordinator to assist with DM management. Not to use insulin  pump while admitted and to administer SQ basal bolus. PRN albuterol  for asthma. Continue home amlodipine , lisinopril , metoprolol  and statin for CAD history and HTN. Added hydralazine .  Keppra  postop. Initially on dexamethasone , but with absence of significant edema on postop MRI, discontinued this.    ICU stay complicated by delirium.Surgical pathology reported as high-grade glioma, WHO grade 4.    Patient's medical record from [facility] has been reviewed by the rehabilitation admission coordinator and physician.   Past Medical History      Past Medical History:  Diagnosis Date   Allergic rhinitis     Arthritis     ASTHMA 06/08/2008   Asthma      as a child   CAD (coronary artery disease)  a. s/p Promus DES (3.0 x 16 mm) to mid LAD 05/16/10;  b. Myoview  05/15/10: anteroseptal ischemia with EF 52%;   c. Cath 05/16/10: single vessel CAD with mLAD 90% tx with PCI and EF 50%;  d. 07/2011 Cath:  Patent stent, nonobs dzs, NL EF.   Chronic kidney disease      was seen by Dr. Lonna and released.   Colonic polyp     DIABETES MELLITUS, TYPE II 06/08/2008   GERD (gastroesophageal reflux disease)     Hemorrhoids     HYPERLIPIDEMIA 06/08/2008   HYPERTENSION 06/08/2008   Impotence of organic origin 08/24/2008   Myocardial infarction (HCC) 04/16/2010   Pneumonia      hx of   Renal vein thrombosis (HCC)     Seasonal allergies          Has the patient had major surgery during 100 days prior to admission? Yes    Family History   family history includes Angina in his father; Dementia in his mother; Diabetes in his father; Heart disease in his father.   Current Medications  Current Medications    Current Facility-Administered Medications:    acetaminophen  (TYLENOL ) tablet 650 mg, 650 mg, Oral, Q4H PRN, 650 mg at 09/17/23 0630 **OR** acetaminophen  (TYLENOL ) suppository 650 mg, 650 mg, Rectal, Q4H PRN, Lanis Pupa, MD   albuterol  (PROVENTIL ) (2.5 MG/3ML) 0.083% nebulizer solution 3 mL, 3 mL, Inhalation, Q6H PRN, Nundkumar, Neelesh, MD   amLODipine  (NORVASC ) tablet 10 mg, 10 mg, Oral, QHS, Nundkumar, Pupa, MD, 10 mg at 09/16/23 2207   bisacodyl  (DULCOLAX) suppository 10 mg, 10 mg, Rectal, Daily PRN, Lanis Pupa, MD   Chlorhexidine  Gluconate Cloth 2 % PADS 6 each, 6 each, Topical, Daily, Nundkumar, Neelesh, MD, 6 each at 09/17/23 0837   heparin  injection 5,000 Units, 5,000 Units, Subcutaneous, Q8H, Nundkumar, Pupa, MD, 5,000 Units at 09/17/23 0630   HYDROcodone -acetaminophen  (NORCO/VICODIN) 5-325 MG per tablet 1 tablet, 1 tablet, Oral, Q4H PRN, Nundkumar, Neelesh, MD, 1 tablet at 09/15/23 0341   insulin  aspart (novoLOG ) injection 0-20 Units, 0-20 Units, Subcutaneous, TID AC & HS, Joshi, Rutwij, MD, 3 Units at 09/17/23 9162   insulin  aspart (novoLOG ) injection 7 Units, 7 Units, Subcutaneous, TID with meals, Gonfa, Taye T, MD   [COMPLETED] insulin  glargine-yfgn (SEMGLEE ) injection 30 Units, 30 Units, Subcutaneous, Once, 30 Units at 09/14/23 2144 **FOLLOWED BY** insulin  glargine-yfgn (SEMGLEE ) injection 40 Units, 40 Units, Subcutaneous, BID, Joshi, Rutwij, MD, 40 Units at 09/17/23 9161   irbesartan  (AVAPRO ) tablet 150 mg, 150 mg, Oral, Daily, Lanis Pupa, MD, 150 mg at 09/17/23 9163   labetalol  (NORMODYNE ) injection 10-40 mg, 10-40 mg, Intravenous, Q10 min PRN, Nundkumar, Neelesh, MD, 20 mg at 09/15/23 0522   levETIRAcetam  (KEPPRA ) tablet 500 mg, 500 mg, Oral, BID, Nundkumar, Neelesh,  MD, 500 mg at 09/17/23 9163   metoprolol  succinate (TOPROL -XL) 24 hr tablet 50 mg, 50 mg, Oral, Daily, Nundkumar, Neelesh, MD, 50 mg at 09/17/23 0836   morphine  (PF) 2 MG/ML injection 1-2 mg, 1-2 mg, Intravenous, Q2H PRN, Nundkumar, Neelesh, MD, 2 mg at 09/15/23 0030   multivitamin (PROSIGHT) tablet 1 tablet, 1 tablet, Oral, QHS, Nundkumar, Pupa, MD, 1 tablet at 09/16/23 2207   ondansetron  (ZOFRAN ) tablet 4 mg, 4 mg, Oral, Q4H PRN **OR** ondansetron  (ZOFRAN ) injection 4 mg, 4 mg, Intravenous, Q4H PRN, Nundkumar, Neelesh, MD, 4 mg at 09/14/23 2113   Oral care mouth rinse, 15 mL, Mouth Rinse, PRN, Lanis Pupa, MD   pantoprazole  (PROTONIX ) EC tablet 40 mg,  40 mg, Oral, QHS, Nundkumar, Gerldine, MD, 40 mg at 09/16/23 2206   potassium chloride  SA (KLOR-CON  M) CR tablet 40 mEq, 40 mEq, Oral, Q3H, Gonfa, Taye T, MD, 40 mEq at 09/17/23 0836   promethazine  (PHENERGAN ) tablet 12.5-25 mg, 12.5-25 mg, Oral, Q4H PRN, Nundkumar, Neelesh, MD, 25 mg at 09/14/23 2211   senna-docusate (Senokot-S) tablet 1 tablet, 1 tablet, Oral, QHS PRN, Nundkumar, Neelesh, MD   simvastatin  (ZOCOR ) tablet 20 mg, 20 mg, Oral, Daily, Nundkumar, Neelesh, MD, 20 mg at 09/17/23 0836     Patients Current Diet:  Diet Order                  Diet - low sodium heart healthy             Diet Carb Modified Fluid consistency: Thin; Room service appropriate? Yes  Diet effective now                         Precautions / Restrictions Precautions Precautions: Fall Restrictions Weight Bearing Restrictions Per Provider Order: No    Has the patient had 2 or more falls or a fall with injury in the past year? No   Prior Activity Level Community (5-7x/wk): prior to one month ago, Independent without AD and driving   Prior Functional Level Self Care: Did the patient need help bathing, dressing, using the toilet or eating? Independent   Indoor Mobility: Did the patient need assistance with walking from room to room (with or  without device)? Independent   Stairs: Did the patient need assistance with internal or external stairs (with or without device)? Independent   Functional Cognition: Did the patient need help planning regular tasks such as shopping or remembering to take medications? Independent   Patient Information Are you of Hispanic, Latino/a,or Spanish origin?: A. No, not of Hispanic, Latino/a, or Spanish origin What is your race?: A. White Do you need or want an interpreter to communicate with a doctor or health care staff?: 0. No   Patient's Response To:  Health Literacy and Transportation Is the patient able to respond to health literacy and transportation needs?: Yes Health Literacy - How often do you need to have someone help you when you read instructions, pamphlets, or other written material from your doctor or pharmacy?: Never In the past 12 months, has lack of transportation kept you from medical appointments or from getting medications?: No In the past 12 months, has lack of transportation kept you from meetings, work, or from getting things needed for daily living?: No   Home Assistive Devices / Equipment Home Equipment: Agricultural consultant (2 wheels), Shower seat, Grab bars - tub/shower   Prior Device Use: Indicate devices/aids used by the patient prior to current illness, exacerbation or injury? None of the above   Current Functional Level Cognition   Arousal/Alertness: Awake/alert Overall Cognitive Status: Impaired/Different from baseline Orientation Level: Oriented X4    Extremity Assessment (includes Sensation/Coordination)   Upper Extremity Assessment: Right hand dominant, LUE deficits/detail (RUE ROM strength and coordination WFL.) LUE Deficits / Details: impaired FTN and FMC. ROM WFL, strenght 4/5 throughout. LUE Sensation: WNL LUE Coordination: decreased fine motor  Lower Extremity Assessment: RLE deficits/detail, LLE deficits/detail, Generalized weakness RLE Deficits /  Details: tremulous throughout arms and legs; MMT scores of 4 hip flexion, 4+ knee extension, 4+ ankle dorsiflexion; sensation intact bil; incoordination noted with rapid alternating movements RLE Coordination: decreased fine motor, decreased gross motor LLE Deficits / Details:  tremulous throughout arms and legs; MMT scores of 4 hip flexion, 4+ knee extension, 4+ ankle dorsiflexion; sensation intact bil; incoordination noted with rapid alternating movements LLE Coordination: decreased fine motor, decreased gross motor     ADLs   Overall ADL's : Needs assistance/impaired Eating/Feeding: Set up, Sitting Grooming: Oral care, Standing, Contact guard assist Grooming Details (indicate cue type and reason): Pt needed cues to actually apply more toothpaste, he only had a graze of paste on his brush. Upper Body Bathing: Sitting, Contact guard assist Lower Body Bathing: Sitting/lateral leans, Minimal assistance Upper Body Dressing : Sitting, Contact guard assist Lower Body Dressing: Minimal assistance, Sitting/lateral leans Lower Body Dressing Details (indicate cue type and reason): don socks Toilet Transfer: Contact guard assist, Rolling walker (2 wheels), Ambulation Toileting- Clothing Manipulation and Hygiene: Contact guard assist, Sitting/lateral lean, Sit to/from stand Functional mobility during ADLs: Contact guard assist, Rolling walker (2 wheels) General ADL Comments: Educated pt on visual scanning and anchoring strategies to address visual deficits. Also discussed fall prevention strategies and adequate lightining with family.     Mobility   Overal bed mobility: Needs Assistance Bed Mobility: Supine to Sit Supine to sit: Mod assist, HOB elevated General bed mobility comments: Pt in recliner on arrival     Transfers   Overall transfer level: Needs assistance Equipment used: Rolling walker (2 wheels) Transfers: Sit to/from Stand Sit to Stand: Contact guard assist General transfer comment:  cues needed for hand placement.     Ambulation / Gait / Stairs / Wheelchair Mobility   Ambulation/Gait Ambulation/Gait assistance: Editor, commissioning (Feet): 110 Feet Assistive device: Rolling walker (2 wheels) Gait Pattern/deviations: Step-through pattern, Decreased step length - right, Decreased step length - left, Decreased stride length, Decreased dorsiflexion - right, Decreased dorsiflexion - left, Shuffle, Trunk flexed General Gait Details: Pt takes slow, small steps with a flexed posture. VCs provided to remain proximal to RW, stand upright, and increase bil step length and feet clearance. Momentary success noted. MinA provided for balance. Needs cues to visually scan and find people in the hall Gait velocity: reduced Gait velocity interpretation: <1.31 ft/sec, indicative of household ambulator     Posture / Balance Dynamic Sitting Balance Sitting balance - Comments: in recliner, not fully assessed Balance Overall balance assessment: Needs assistance Sitting-balance support: Feet supported, Bilateral upper extremity supported, Single extremity supported Sitting balance-Leahy Scale: Fair Sitting balance - Comments: in recliner, not fully assessed Postural control: Posterior lean Standing balance support: Bilateral upper extremity supported, During functional activity, Reliant on assistive device for balance Standing balance-Leahy Scale: Poor Standing balance comment: Reliant on RW for gait, static stand with CGA.     Special needs/care consideration Hgb A1c 7.4 On insulin  pump and Ozempic  at home Delirium precautions    Previous Home Environment  Living Arrangements: Spouse/significant other  Lives With: Spouse Available Help at Discharge: Family, Available 24 hours/day (wife, adult children and family) Type of Home: House Home Layout: One level Home Access: Stairs to enter Entrance Stairs-Rails: Right, Left Entrance Stairs-Number of Steps: 5 Bathroom Shower/Tub:  Health visitor: Handicapped height Bathroom Accessibility: Yes How Accessible: Accessible via walker Home Care Services: No   Discharge Living Setting Plans for Discharge Living Setting: Patient's home, Lives with (comment) (wife) Type of Home at Discharge: House Discharge Home Layout: One level Discharge Home Access: Stairs to enter Entrance Stairs-Rails: Right, Left Entrance Stairs-Number of Steps: 5 Discharge Bathroom Shower/Tub: Walk-in shower Discharge Bathroom Toilet: Handicapped height Discharge Bathroom Accessibility: Yes How  Accessible: Accessible via walker Does the patient have any problems obtaining your medications?: No   Social/Family/Support Systems Patient Roles: Spouse, Parent 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, Joel Williams Anticipated Caregiver: wife, children and family Anticipated Caregiver's Contact Information: see contacts Ability/Limitations of Caregiver: no limitations; retired; very involved with their church Caregiver Availability: 24/7 Discharge Plan Discussed with Primary Caregiver: Yes Is Caregiver In Agreement with Plan?: Yes Does Caregiver/Family have Issues with Lodging/Transportation while Pt is in Rehab?: Yes   Goals Patient/Family Goal for Rehab: supervision with PT, PT and SLP Expected length of stay: ELOS 10 to 14 days Pt/Family Agrees to Admission and willing to participate: Yes Program Orientation Provided & Reviewed with Pt/Caregiver Including Roles  & Responsibilities: Yes   Decrease burden of Care through IP rehab admission: n/a   Possible need for SNF placement upon discharge: not anticipated   Patient Condition: I have reviewed medical records from [facility], spoken with CM, and patient, spouse, and family member. I met with patient at the bedside for inpatient rehabilitation assessment.  Patient will benefit from ongoing PT, OT, and SLP, can actively participate in 3 hours of therapy a day 5 days of the week, and can make  measurable gains during the admission.  Patient will also benefit from the coordinated team approach during an Inpatient Acute Rehabilitation admission.  The patient will receive intensive therapy as well as Rehabilitation physician, nursing, social worker, and care management interventions.  Due to bladder management, bowel management, safety, skin/wound care, disease management, medication administration, pain management, and patient education the patient requires 24 hour a day rehabilitation nursing.  The patient is currently min assist overall with mobility and basic ADLs.  Discharge setting and therapy post discharge at home with home health is anticipated.  Patient has agreed to participate in the Acute Inpatient Rehabilitation Program and will admit today.   Preadmission Screen Completed By:  Alison Heron Lot RN MSN 09/17/2023 11:08 AM ______________________________________________________________________   Discussed status with Dr. Lorilee on 09/17/23 at 1108 and received approval for admission today.   Admission Coordinator:  Alison Heron Lot, RN MSN time [id number] Date 09/17/23    Assessment/Plan: Diagnosis: s/p craniotomy for tumor resection Does the need for close, 24 hr/day Medical supervision in concert with the patient's rehab needs make it unreasonable for this patient to be served in a less intensive setting? Yes Co-Morbidities requiring supervision/potential complications: HTN, HLD, DM2, asthma, CKD Due to bladder management, bowel management, safety, skin/wound care, disease management, medication administration, pain management, and patient education, does the patient require 24 hr/day rehab nursing? Yes Does the patient require coordinated care of a physician, rehab nurse, PT, OT, and SLP to address physical and functional deficits in the context of the above medical diagnosis(es)? Yes Addressing deficits in the following areas: balance, endurance, locomotion, strength,  transferring, bowel/bladder control, bathing, dressing, feeding, grooming, toileting, and cognition Can the patient actively participate in an intensive therapy program of at least 3 hrs of therapy 5 days a week? Yes The potential for patient to make measurable gains while on inpatient rehab is excellent Anticipated functional outcomes upon discharge from inpatient rehab: supervision PT, supervision OT, supervision SLP Estimated rehab length of stay to reach the above functional goals is: 10-14 days Anticipated discharge destination: Home 10. Overall Rehab/Functional Prognosis: excellent     MD Signature: Sven Lorilee, MD          Revision History

## 2023-09-17 NOTE — H&P (Incomplete)
 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  Per chart review patient lives with spouse.  1 level home 5 steps entry.  Independent up until recently using a rolling walker.  He does have supportive family in the area.  Presented 09/14/2023 after being seen urgently in the outpatient neurosurgery clinic after outpatient MRI obtained for constitutional symptoms including unintentional weight loss generalized fatigue and dizziness that revealed large peripherally enhancing temporal lesion.  Underwent stereotactic right temporal occipital craniotomy for resection of tumor 09/14/2023 per Dr. Lanis.  Surgical pathology high-grade glioma.  And plan outpatient follow-up with neuro/oncology services.  Patient was cleared to begin subcutaneous heparin  for DVT prophylaxis.  Patient did complete Decadron  taper.  Keppra  added for seizure prophylaxis.  Tolerating a regular consistency diet.  Therapy evaluations completed due to patient's decreased functional mobility was admitted for a comprehensive rehab program.  Review of Systems  Constitutional:  Positive for malaise/fatigue. Negative for fever.  HENT:  Negative for hearing loss.   Eyes:  Negative for blurred vision and double vision.  Respiratory:  Negative for cough.        Occasional shortness of breath with heavy exertion  Cardiovascular:  Negative for chest pain, palpitations and leg swelling.  Gastrointestinal:  Positive for constipation. Negative for heartburn, nausea and vomiting.       GERD  Genitourinary:  Positive for urgency. Negative for dysuria, flank pain and hematuria.  Musculoskeletal:  Positive for joint  pain and myalgias.  Skin:  Negative for rash.  Neurological:  Positive for dizziness, weakness and headaches.  All other systems reviewed and are negative.  Past Medical History:  Diagnosis Date   Allergic rhinitis    Arthritis    ASTHMA 06/08/2008   Asthma    as a child   CAD (coronary artery disease)    a. s/p Promus DES (3.0 x 16 mm) to mid LAD 05/16/10;  b. Myoview  05/15/10: anteroseptal ischemia with EF 52%;   c. Cath 05/16/10: single vessel CAD with mLAD 90% tx with PCI and EF 50%;  d. 07/2011 Cath:  Patent stent, nonobs dzs, NL EF.   Chronic kidney disease    was seen by Dr. Lonna and released.   Colonic polyp    DIABETES MELLITUS, TYPE II 06/08/2008   GERD (gastroesophageal reflux disease)    Hemorrhoids    HYPERLIPIDEMIA 06/08/2008   HYPERTENSION 06/08/2008   Impotence of organic origin 08/24/2008   Myocardial infarction (HCC) 04/16/2010   Pneumonia    hx of   Renal vein thrombosis (HCC)    Seasonal allergies    Past Surgical History:  Procedure Laterality Date   APPLICATION OF CRANIAL NAVIGATION Right 09/14/2023   Procedure: COMPUTER-ASSISTED NAVIGATION, FOR CRANIAL PROCEDURE;  Surgeon: Lanis Pupa, MD;  Location: [facility] OR;  Service: Neurosurgery;  Laterality: Right;   BACK SURGERY  2011   CATARACT EXTRACTION Bilateral    2024   CORONARY ANGIOPLASTY WITH STENT PLACEMENT     CRANIOTOMY Right 09/14/2023   Procedure: STEREOTACTIC RIGHT TEMPRO-OCCIPITAL CRANIOTOMY FOR RESECTION OF TUMOR;  Surgeon: Lanis Pupa, MD;  Location: [facility] OR;  Service: Neurosurgery;  Laterality: Right;   LEFT HEART CATHETERIZATION WITH CORONARY ANGIOGRAM N/A 07/29/2011  Procedure: LEFT HEART CATHETERIZATION WITH CORONARY ANGIOGRAM;  Surgeon: Lonni JONETTA Cash, MD;  Location: [facility] CATH LAB;  Service: Cardiovascular;  Laterality: N/A;   LEFT HEART CATHETERIZATION WITH CORONARY ANGIOGRAM N/A 06/14/2013   Procedure: LEFT HEART CATHETERIZATION WITH CORONARY ANGIOGRAM;  Surgeon: Lonni JONETTA Cash, MD;  Location: [facility] CATH LAB;  Service: Cardiovascular;  Laterality: N/A;   RADIOLOGY WITH ANESTHESIA N/A 06/09/2022   Procedure: MRI WITH ANESTHESIA OF LUMBAR SPINE WITHOUT CONTRAST;  Surgeon: Radiologist, Medication, MD;  Location: [facility] OR;  Service: Radiology;  Laterality: N/A;   Family History  Problem Relation Age of Onset   Diabetes Father    Heart disease Father    Angina Father    Dementia Mother    Social History:  reports that he quit smoking about 21 years ago. His smoking use included cigarettes. He started smoking about 74 years ago. He has a 106 pack-year smoking history. He has never used smokeless tobacco. He reports that he does not currently use alcohol. He reports that he does not use drugs. Allergies:  Allergies  Allergen Reactions   Invokana [Canagliflozin]     Yeast infections   Trulicity [Dulaglutide] Other (See Comments)    yeast infections   Medications Prior to Admission  Medication Sig Dispense Refill   amLODipine  (NORVASC ) 10 MG tablet Take 1 tablet (10 mg total) by mouth daily. (Patient taking differently: Take 10 mg by mouth at bedtime.) 90 tablet 3   Ascorbic Acid (VITAMIN C) 1000 MG tablet Take 1,000 mg by mouth in the morning and at bedtime.     aspirin  81 MG tablet Take 1 tablet (81 mg total) by mouth daily. 30 tablet 0   B-D ULTRAFINE III SHORT PEN 31G X 8 MM MISC Inject 1 Syringe into the skin daily.     BIOTIN PO Take 1 tablet by mouth every evening.     glucagon 1 MG injection Inject 1 mg into the vein once as needed (Low blood sugar).      insulin  glargine (LANTUS  SOLOSTAR) 100 UNIT/ML Solostar Pen Inject 80 Units into the skin in the morning.     lisinopril  (ZESTRIL ) 40 MG tablet Take 1 tablet (40 mg total) by mouth daily. 90 tablet 3   metFORMIN  (GLUCOPHAGE -XR) 500 MG 24 hr tablet Take 1,000 mg by mouth 2 (two) times daily.     metoprolol  succinate (TOPROL -XL) 50 MG 24 hr tablet Take with or immediately following a meal. 90 tablet 3    Multiple Vitamins-Minerals (PRESERVISION AREDS 2 PO) Take 2 capsules by mouth at bedtime.     Potassium 99 MG TABS Take 99 mg by mouth every evening.     simvastatin  (ZOCOR ) 20 MG tablet Take 1 tablet (20 mg total) by mouth daily. 90 tablet 3   VITAMIN D PO Take 5,000 Units by mouth in the morning.     vitamin E 400 UNIT capsule Take 400 Units by mouth in the morning.     zinc  sulfate, 50mg  elemental zinc , 220 (50 Zn) MG capsule Take 220 mg by mouth at bedtime.     albuterol  (VENTOLIN  HFA) 108 (90 Base) MCG/ACT inhaler Inhale 1-2 puffs into the lungs every 6 (six) hours as needed for wheezing or shortness of breath. 8 g 0   Continuous Glucose Sensor (FREESTYLE LIBRE 3 PLUS SENSOR) MISC apply to upper back of arm for 90 days change sensor every 15 days IC-10 CODE: E11.9     SEMAGLUTIDE , 2 MG/DOSE, [place] Inject 2 mg into  the skin every Monday.        Home: Home Living Family/patient expects to be discharged to:: Private residence Living Arrangements: Spouse/significant other Available Help at Discharge: Family, Available 24 hours/day (wife, adult children and family) Type of Home: House Home Access: Stairs to enter Secretary/administrator of Steps: 5 Entrance Stairs-Rails: Right, Left Home Layout: One level Bathroom Shower/Tub: Health visitor: Handicapped height Bathroom Accessibility: Yes Home Equipment: Agricultural consultant (2 wheels), Information systems manager, Grab bars - tub/shower  Lives With: Spouse   Functional History: Prior Function Prior Level of Function : Independent/Modified Independent, Driving Mobility Comments: A month ago, pt was completely independent without DME; over the past month, the pt has transitioned to needing to use a RW for mobility ADLs Comments: A month ago, pt was completely independent, driving, and cognitively intact; over the past month, his cognition has gradually worsened and now is unable to drive  Functional Status:  Mobility: Bed Mobility Overal  bed mobility: Needs Assistance Bed Mobility: Supine to Sit Supine to sit: Mod assist, HOB elevated General bed mobility comments: Pt in recliner on arrival Transfers Overall transfer level: Needs assistance Equipment used: Rolling walker (2 wheels) Transfers: Sit to/from Stand Sit to Stand: Contact guard assist General transfer comment: cues needed for hand placement. Ambulation/Gait Ambulation/Gait assistance: Min assist Gait Distance (Feet): 110 Feet Assistive device: Rolling walker (2 wheels) Gait Pattern/deviations: Step-through pattern, Decreased step length - right, Decreased step length - left, Decreased stride length, Decreased dorsiflexion - right, Decreased dorsiflexion - left, Shuffle, Trunk flexed General Gait Details: Pt takes slow, small steps with a flexed posture. VCs provided to remain proximal to RW, stand upright, and increase bil step length and feet clearance. Momentary success noted. MinA provided for balance. Needs cues to visually scan and find people in the hall Gait velocity: reduced Gait velocity interpretation: <1.31 ft/sec, indicative of household ambulator    ADL: ADL Overall ADL's : Needs assistance/impaired Eating/Feeding: Set up, Sitting Grooming: Oral care, Standing, Contact guard assist Grooming Details (indicate cue type and reason): Pt needed cues to actually apply more toothpaste, he only had a graze of paste on his brush. Upper Body Bathing: Sitting, Contact guard assist Lower Body Bathing: Sitting/lateral leans, Minimal assistance Upper Body Dressing : Sitting, Contact guard assist Lower Body Dressing: Minimal assistance, Sitting/lateral leans Lower Body Dressing Details (indicate cue type and reason): don socks Toilet Transfer: Contact guard assist, Rolling walker (2 wheels), Ambulation Toileting- Clothing Manipulation and Hygiene: Contact guard assist, Sitting/lateral lean, Sit to/from stand Functional mobility during ADLs: Contact guard  assist, Rolling walker (2 wheels) General ADL Comments: Educated pt on visual scanning and anchoring strategies to address visual deficits. Also discussed fall prevention strategies and adequate lightining with family.  Cognition: Cognition Overall Cognitive Status: Impaired/Different from baseline Arousal/Alertness: Awake/alert Orientation Level: Oriented X4 Cognition Arousal: Alert Behavior During Therapy: WFL for tasks assessed/performed Overall Cognitive Status: Impaired/Different from baseline  Physical Exam: Blood pressure 125/70, pulse 75, temperature 97.8 F (36.6 C), temperature source Oral, resp. rate 17, height 5' 9 (1.753 m), weight 99.3 kg, SpO2 98%. Physical Exam HENT:     Head:     Comments: Craniotomy site dressed Neurological:     Comments: Patient is alert sitting up in chair.  Makes eye contact with examiner.  Follows simple commands.  He provides name age and date but had some difficulty with hospital course.     Results for orders placed or performed during the hospital encounter of 09/14/23 (from  the past 48 hours)  Glucose, capillary     Status: Abnormal   Collection Time: 09/15/23 12:05 PM  Result Value Ref Range   Glucose-Capillary 224 (H) 70 - 99 mg/dL    Comment: Glucose reference range applies only to samples taken after fasting for at least 8 hours.  Glucose, capillary     Status: Abnormal   Collection Time: 09/15/23  3:32 PM  Result Value Ref Range   Glucose-Capillary 204 (H) 70 - 99 mg/dL    Comment: Glucose reference range applies only to samples taken after fasting for at least 8 hours.  Glucose, capillary     Status: Abnormal   Collection Time: 09/15/23  7:24 PM  Result Value Ref Range   Glucose-Capillary 200 (H) 70 - 99 mg/dL    Comment: Glucose reference range applies only to samples taken after fasting for at least 8 hours.  Glucose, capillary     Status: Abnormal   Collection Time: 09/15/23  9:34 PM  Result Value Ref Range    Glucose-Capillary 188 (H) 70 - 99 mg/dL    Comment: Glucose reference range applies only to samples taken after fasting for at least 8 hours.  Triglycerides     Status: None   Collection Time: 09/16/23  5:27 AM  Result Value Ref Range   Triglycerides 88 <150 mg/dL    Comment: Performed at [facility], [address] [place], [place] [address]  Glucose, capillary     Status: Abnormal   Collection Time: 09/16/23  7:53 AM  Result Value Ref Range   Glucose-Capillary 173 (H) 70 - 99 mg/dL    Comment: Glucose reference range applies only to samples taken after fasting for at least 8 hours.  Glucose, capillary     Status: Abnormal   Collection Time: 09/16/23 12:06 PM  Result Value Ref Range   Glucose-Capillary 234 (H) 70 - 99 mg/dL    Comment: Glucose reference range applies only to samples taken after fasting for at least 8 hours.   Comment 1 Notify RN    Comment 2 Document in Chart   Glucose, capillary     Status: Abnormal   Collection Time: 09/16/23  4:16 PM  Result Value Ref Range   Glucose-Capillary 132 (H) 70 - 99 mg/dL    Comment: Glucose reference range applies only to samples taken after fasting for at least 8 hours.  Glucose, capillary     Status: Abnormal   Collection Time: 09/16/23  8:21 PM  Result Value Ref Range   Glucose-Capillary 102 (H) 70 - 99 mg/dL    Comment: Glucose reference range applies only to samples taken after fasting for at least 8 hours.   Comment 1 Notify RN   Glucose, capillary     Status: Abnormal   Collection Time: 09/16/23 11:52 PM  Result Value Ref Range   Glucose-Capillary 123 (H) 70 - 99 mg/dL    Comment: Glucose reference range applies only to samples taken after fasting for at least 8 hours.   Comment 1 Notify RN   Glucose, capillary     Status: Abnormal   Collection Time: 09/17/23  3:39 AM  Result Value Ref Range   Glucose-Capillary 106 (H) 70 - 99 mg/dL    Comment: Glucose reference range applies only to samples taken after fasting  for at least 8 hours.   Comment 1 Notify RN   Renal function panel     Status: Abnormal   Collection Time: 09/17/23  4:02  AM  Result Value Ref Range   Sodium 138 135 - 145 mmol/L   Potassium 2.9 (L) 3.5 - 5.1 mmol/L   Chloride 101 98 - 111 mmol/L   CO2 29 22 - 32 mmol/L   Glucose, Bld 103 (H) 70 - 99 mg/dL    Comment: Glucose reference range applies only to samples taken after fasting for at least 8 hours.   BUN 13 8 - 23 mg/dL   Creatinine, Ser 9.25 0.61 - 1.24 mg/dL   Calcium 8.6 (L) 8.9 - 10.3 mg/dL   Phosphorus 2.9 2.5 - 4.6 mg/dL   Albumin 2.9 (L) 3.5 - 5.0 g/dL   GFR, Estimated >39 >39 mL/min    Comment: (NOTE) Calculated using the CKD-EPI Creatinine Equation (2021)    Anion gap 8 5 - 15    Comment: Performed at [facility], [address] [place], [place] [address]  Magnesium     Status: None   Collection Time: 09/17/23  4:02 AM  Result Value Ref Range   Magnesium 1.9 1.7 - 2.4 mg/dL    Comment: Performed at [facility], [address] [place], [place] [address]  CBC     Status: None   Collection Time: 09/17/23  4:02 AM  Result Value Ref Range   WBC 7.3 4.0 - 10.5 K/uL   RBC 4.87 4.22 - 5.81 MIL/uL   Hemoglobin 13.5 13.0 - 17.0 g/dL   HCT 59.1 60.9 - 47.9 %   MCV 83.8 80.0 - 100.0 fL   MCH 27.7 26.0 - 34.0 pg   MCHC 33.1 30.0 - 36.0 g/dL   RDW 85.8 88.4 - 84.4 %   Platelets 167 150 - 400 K/uL   nRBC 0.0 0.0 - 0.2 %    Comment: Performed at [facility], [address] [place], [place] [address]  Glucose, capillary     Status: Abnormal   Collection Time: 09/17/23  8:00 AM  Result Value Ref Range   Glucose-Capillary 126 (H) 70 - 99 mg/dL    Comment: Glucose reference range applies only to samples taken after fasting for at least 8 hours.   Comment 1 Notify RN    Comment 2 Document in Chart    No results found.    Blood pressure 125/70, pulse 75, temperature 97.8 F (36.6 C), temperature source Oral, resp. rate 17, height 5' 9 (1.753  m), weight 99.3 kg, SpO2 98%.  Medical Problem List and Plan: 1. 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  Status post right temporal occipital craniotomy for resection of tumor.  Path report high-grade glioma.  Follow-up neuro-oncology outpatient.  Decadron  taper completed  -patient may *** shower  -ELOS/Goals: *** 2.  Antithrombotics: -DVT/anticoagulation:  Pharmaceutical: Heparin  initiated 09/16/2023  -antiplatelet therapy: Aspirin  on hold due to craniotomy 3. Pain Management: Hydrocodone  as needed 4. Mood/Behavior/Sleep: Provide emotional support  -antipsychotic agents: N/A 5. Neuropsych/cognition: This patient is not capable of making decisions on his own behalf. 6. Skin/Wound Care: Routine skin checks 7. Fluids/Electrolytes/Nutrition: Routine in and outs with follow-up chemistries 8.  Seizure prophylaxis.  500 mg twice daily 9.  Hypertension.  Avapro  150 mg daily, Norvasc  10 mg nightly, Toprol -XL 50 mg daily.  Monitor with increased mobility 10.  Diabetes mellitus.  Patient with insulin  pump PTA as well as Glucophage  1000 mg twice daily and semaglutide .  Currently on NovoLog  5 units 3 times daily, Semglee  40 units twice daily. 11.  Hyperlipidemia.  Zocor  12.  CKD.  Seen by renal services in the past and has since signed off.  Follow-up chemistries 13.  CAD.  Low-dose aspirin  on hold.  No chest pain or shortness of breath 14.  GERD.  Protonix  15.  History of asthma.  Albuterol  inhaler as needed. 16.  Class I obesity.  BMI 32.33.  Dietary follow-up Toribio JINNY Pitch, PA-C 09/17/2023

## 2023-09-17 NOTE — Plan of Care (Signed)
  Problem: Consults Goal: RH BRAIN INJURY PATIENT EDUCATION Description: Description: See Patient Education module for eduction specifics Outcome: Progressing   Problem: RH BOWEL ELIMINATION Goal: RH STG MANAGE BOWEL WITH ASSISTANCE Description: STG Manage Bowel with mod I Assistance. Outcome: Progressing Goal: RH STG MANAGE BOWEL W/MEDICATION W/ASSISTANCE Description: STG Manage Bowel with Medication with mod I Assistance. Outcome: Progressing   Problem: RH BLADDER ELIMINATION Goal: RH STG MANAGE BLADDER WITH ASSISTANCE Description: STG Manage Bladder With  toileting Assistance Outcome: Progressing   Problem: RH SAFETY Goal: RH STG ADHERE TO SAFETY PRECAUTIONS W/ASSISTANCE/DEVICE Description: STG Adhere to Safety Precautions With Assistance/Device. Outcome: Progressing   Problem: RH PAIN MANAGEMENT Goal: RH STG PAIN MANAGED AT OR BELOW PT'S PAIN GOAL Description: < 4 with prns Outcome: Progressing   Problem: RH KNOWLEDGE DEFICIT BRAIN INJURY Goal: RH STG INCREASE KNOWLEDGE OF SELF CARE AFTER BRAIN INJURY Description: Patient and Spouse will be able to manage care using educational resources for medications and dietary modification independently Outcome: Progressing

## 2023-09-17 NOTE — Discharge Summary (Signed)
 Physician Discharge Summary  Patient ID: Joel Williams MRN: [id number] DOB/AGE: Jun 24, 1950 73 y.o.  Admit date: 09/14/2023 Discharge date: 09/17/2023  Admission Diagnoses: Right hemispheric brain tumor  Discharge Diagnoses: Right hemispheric brain tumor Principal Problem:   Status post craniotomy Active Problems:   Brain tumor [facility])   Discharged Condition: fair  Hospital Course: Patient was admitted with a substantial weakness on his left side secondary to a newly diagnosed lesion in the right temporal and parietal regions.  He underwent surgical decompression.  He is ready for rehab.  Consults: rehabilitation medicine  Significant Diagnostic Studies: None  Treatments: surgery: See op note  Discharge Exam: Blood pressure 125/70, pulse 75, temperature 97.8 F (36.6 C), temperature source Oral, resp. rate 17, height 5' 9 (1.753 m), weight 99.3 kg, SpO2 98%. Incision on scalp was clean and dry he is significantly weak on the left side however this is improving with his ability ambulate with guidance and use of a walker.  He is felt to be a good candidate for comprehensive inpatient rehabilitation.  Disposition: Discharge disposition: [facility]-Another Health Care Institution Not Defined       Discharge Instructions     Diet - low sodium heart healthy   Complete by: As directed    Increase activity slowly   Complete by: As directed    No wound care   Complete by: As directed       Allergies as of 09/17/2023       Reactions   Invokana [canagliflozin]    Yeast infections   Trulicity [dulaglutide] Other (See Comments)   yeast infections        Medication List     TAKE these medications    albuterol  108 (90 Base) MCG/ACT inhaler Commonly known as: VENTOLIN  HFA Inhale 1-2 puffs into the lungs every 6 (six) hours as needed for wheezing or shortness of breath.   amLODipine  10 MG tablet Commonly known as: NORVASC  Take 1 tablet (10 mg total) by mouth daily. What  changed: when to take this   aspirin  81 MG tablet Take 1 tablet (81 mg total) by mouth daily.   B-D ULTRAFINE III SHORT PEN 31G X 8 MM Misc Generic drug: Insulin  Pen Needle Inject 1 Syringe into the skin daily.   BIOTIN PO Take 1 tablet by mouth every evening.   FreeStyle Libre 3 Plus Sensor Misc apply to upper back of arm for 90 days change sensor every 15 days IC-10 CODE: E11.9   glucagon 1 MG injection Inject 1 mg into the vein once as needed (Low blood sugar).   Lantus  SoloStar 100 UNIT/ML Solostar Pen Generic drug: insulin  glargine Inject 80 Units into the skin in the morning.   lisinopril  40 MG tablet Commonly known as: ZESTRIL  Take 1 tablet (40 mg total) by mouth daily.   metFORMIN  500 MG 24 hr tablet Commonly known as: GLUCOPHAGE -XR Take 1,000 mg by mouth 2 (two) times daily.   metoprolol  succinate 50 MG 24 hr tablet Commonly known as: TOPROL -XL Take with or immediately following a meal.   Potassium 99 MG Tabs Take 99 mg by mouth every evening.   PRESERVISION AREDS 2 PO Take 2 capsules by mouth at bedtime.   SEMAGLUTIDE  (2 MG/DOSE) [place] Inject 2 mg into the skin every Monday.   simvastatin  20 MG tablet Commonly known as: ZOCOR  Take 1 tablet (20 mg total) by mouth daily.   vitamin C 1000 MG tablet Take 1,000 mg by mouth in the morning and at bedtime.  VITAMIN D PO Take 5,000 Units by mouth in the morning.   vitamin E 180 MG (400 UNITS) capsule Take 400 Units by mouth in the morning.   zinc  sulfate (50mg  elemental zinc ) 220 (50 Zn) MG capsule Take 220 mg by mouth at bedtime.         Signed: Victory PARAS Joby Richart 09/17/2023, 10:23 AM

## 2023-09-17 NOTE — Progress Notes (Signed)
 PROGRESS NOTE  Joel Williams FMW:[id number] DOB: November 01, 1950   PCP: [name], MD  Patient is from: Home.  Independently ambulates at baseline.  DOA: 09/14/2023 LOS: 3  Chief complaints No chief complaint on file.    Brief Narrative / Interim history: 73 year old M with PMH of IDDM on insulin  pump, CAD/CABG, asthma, CKD, HTN and HLD initially presented to PCP with complaints of dizziness, unintentional weight loss and fatigue and had an outpatient MRI that showed large right temporal lobe tumor.  After discussion with multidisciplinary neuro-oncology team, he presented for craniotomy and surgical resection on 7/1 that was done without complication.  Postoperatively, he was admitted to ICU for recovery.  ICU stay complicated by delirium.   PCCM requested TRH follow-up for medical management.  Neurosurgery remains primary.  Surgical pathology reported as high-grade glioma, WHO grade 4.   Therapy recommended CIR.  Subjective: Seen and examined earlier this morning.  No major events overnight or this morning.  No major complaints other than mild headache and right jaw pain.  Objective: Vitals:   09/16/23 1936 09/16/23 2348 09/17/23 0332 09/17/23 0710  BP: 139/81 117/65 118/62 125/70  Pulse: 73 76 72 75  Resp: 14  16 17   Temp: 98.2 F (36.8 C) 97.8 F (36.6 C) 97.8 F (36.6 C) 97.8 F (36.6 C)  TempSrc: Oral Oral Oral Oral  SpO2: 92% 94% 96% 98%  Weight:      Height:        Examination:  GENERAL: No apparent distress.  Nontoxic. HEENT: MMM.  Vision and hearing grossly intact.  Craniotomy wound.  Staples in place. NECK: Supple.  No apparent JVD.  RESP:  No IWOB.  Fair aeration bilaterally. CVS:  RRR. Heart sounds normal.  ABD/GI/GU: BS+. Abd soft, NTND.  MSK/EXT:  Moves extremities. No apparent deformity. No edema.  SKIN: Right craniotomy wound. NEURO: AA.  Oriented appropriately.  No apparent focal neuro deficit. PSYCH: Calm. Normal affect.    Consultants:  Critical care Neuro-oncology per primary  Procedures: 7/1-history of static right temporal occipital craniectomy and resection of temporal mass.  Microbiology summarized: MRSA PCR screen nonreactive  Assessment and plan: Right temporal mass/tumor:  -S/p surgical resection by Dr. Lanis on 7/1 -Surgical pathology with high-grade glioma, WHO grade 4 -Defer to primary team. -PT/OT-recommended CIR   IDDM-2 with hyperglycemia: A1c 7.4%.  On insulin  pump and Ozempic  at home. Recent Labs  Lab 09/16/23 1616 09/16/23 2021 09/16/23 2352 09/17/23 0339 09/17/23 0800  GLUCAP 132* 102* 123* 106* 126*  -Continue SSI-resistant scale -Continue Semglee  40 units twice daily -Decrease Novolog  from 7 to 5 units 3 times daily with meals -Resume home metformin  and Ozempic  on discharge -Appreciate help by diabetic coordinator   Mild intermittent asthma: Stable - PRN albuterol    History of CAD/CABG: Stable  - Continue Avapro , metoprolol  and statin.   - Resume aspirin  when okay from surgical standpoint   Essential hypertension: Normotensive.   - Continue amlodipine , Avapro  and metoprolol    Hypokalemia: P.o. KCl 40 every 3 hours x 3  - Recheck renal panel in the morning  Delirium: Resolved.  Class I obesity Body mass index is 32.33 kg/m. - Continue Ozempic  outpatient         DVT prophylaxis:  heparin  injection 5,000 Units Start: 09/16/23 1400 Place and maintain sequential compression device Start: 09/14/23 1632 SCDs Start: 09/14/23 1502  Code Status: Full code Family Communication: Updated patient's wife at bedside. Level of care: Progressive Status is: Inpatient   Final  disposition: CIR. Medically stable for discharge from our standpoint   35 minutes with more than 50% spent in reviewing records, counseling patient/family and coordinating care.   Sch Meds:  Scheduled Meds:  amLODipine   10 mg Oral QHS   Chlorhexidine  Gluconate Cloth  6 each Topical  Daily   heparin  injection (subcutaneous)  5,000 Units Subcutaneous Q8H   insulin  aspart  0-20 Units Subcutaneous TID AC & HS   insulin  aspart  7 Units Subcutaneous TID with meals   insulin  glargine-yfgn  40 Units Subcutaneous BID   irbesartan   150 mg Oral Daily   levETIRAcetam   500 mg Oral BID   metoprolol  succinate  50 mg Oral Daily   multivitamin  1 tablet Oral QHS   pantoprazole   40 mg Oral QHS   potassium chloride   40 mEq Oral Q3H   simvastatin   20 mg Oral Daily   Continuous Infusions: PRN Meds:.acetaminophen  **OR** acetaminophen , albuterol , bisacodyl , HYDROcodone -acetaminophen , labetalol , morphine  injection, ondansetron  **OR** ondansetron  (ZOFRAN ) IV, mouth rinse, promethazine , senna-docusate  Antimicrobials: Anti-infectives (From admission, onward)    Start     Dose/Rate Route Frequency Ordered Stop   09/14/23 1600  ceFAZolin  (ANCEF ) IVPB 2g/100 mL premix        2 g 200 mL/hr over 30 Minutes Intravenous Every 8 hours 09/14/23 1501 09/15/23 0552   09/14/23 0600  ceFAZolin  (ANCEF ) IVPB 2g/100 mL premix        2 g 200 mL/hr over 30 Minutes Intravenous On call to O.R. 09/14/23 0543 09/14/23 1203   09/14/23 0549  ceFAZolin  (ANCEF ) 2-4 GM/100ML-% IVPB       Note to Pharmacy: Barron Friday D: cabinet override      09/14/23 0549 09/14/23 0840        I have personally reviewed the following labs and images: CBC: Recent Labs  Lab 09/14/23 0948 09/14/23 1153 09/15/23 0900 09/17/23 0402  WBC  --   --  9.2 7.3  HGB 11.9* 12.2* 13.2 13.5  HCT 35.0* 36.0* 39.4 40.8  MCV  --   --  83.5 83.8  PLT  --   --  153 167   BMP &GFR Recent Labs  Lab 09/14/23 0948 09/14/23 1153 09/15/23 0900 09/17/23 0402  NA 144 143 136 138  K 4.0 3.9 3.7 2.9*  CL  --   --  101 101  CO2  --   --  24 29  GLUCOSE  --   --  255* 103*  BUN  --   --  11 13  CREATININE  --   --  0.68 0.74  CALCIUM  --   --  8.9 8.6*  MG  --   --  1.5* 1.9  PHOS  --   --  3.1 2.9   Estimated Creatinine  Clearance: 94.1 mL/min (by C-G formula based on SCr of 0.74 mg/dL). Liver & Pancreas: Recent Labs  Lab 09/15/23 0900 09/17/23 0402  AST 21  --   ALT 17  --   ALKPHOS 42  --   BILITOT 0.8  --   PROT 6.2*  --   ALBUMIN 3.3* 2.9*   No results for input(s): LIPASE, AMYLASE in the last 168 hours. No results for input(s): AMMONIA in the last 168 hours. Diabetic: Recent Labs    09/14/23 1542  HGBA1C 7.4*   Recent Labs  Lab 09/16/23 1616 09/16/23 2021 09/16/23 2352 09/17/23 0339 09/17/23 0800  GLUCAP 132* 102* 123* 106* 126*   Cardiac Enzymes: No results for input(s): CKTOTAL,  CKMB, CKMBINDEX, TROPONINI in the last 168 hours. No results for input(s): PROBNP in the last 8760 hours. Coagulation Profile: No results for input(s): INR, PROTIME in the last 168 hours. Thyroid  Function Tests: No results for input(s): TSH, T4TOTAL, FREET4, T3FREE, THYROIDAB in the last 72 hours. Lipid Profile: Recent Labs    09/16/23 0527  TRIG 88   Anemia Panel: No results for input(s): VITAMINB12, FOLATE, FERRITIN, TIBC, IRON, RETICCTPCT in the last 72 hours. Urine analysis:    Component Value Date/Time   COLORURINE AMBER (A) 08/22/2023 1153   APPEARANCEUR HAZY (A) 08/22/2023 1153   LABSPEC 1.020 08/22/2023 1153   PHURINE 5.0 08/22/2023 1153   GLUCOSEU 50 (A) 08/22/2023 1153   GLUCOSEU 500 (?) 08/23/2008 1515   HGBUR NEGATIVE 08/22/2023 1153   HGBUR negative 06/10/2009 0903   BILIRUBINUR NEGATIVE 08/22/2023 1153   BILIRUBINUR neg 10/02/2011 1133   KETONESUR NEGATIVE 08/22/2023 1153   PROTEINUR 30 (A) 08/22/2023 1153   UROBILINOGEN 0.2 10/02/2011 1133   UROBILINOGEN 0.2 06/10/2009 0903   NITRITE NEGATIVE 08/22/2023 1153   LEUKOCYTESUR TRACE (A) 08/22/2023 1153   Sepsis Labs: Invalid input(s): PROCALCITONIN, LACTICIDVEN  Microbiology: Recent Results (from the past 240 hours)  MRSA Next Gen by PCR, Nasal     Status: None   Collection  Time: 09/14/23  3:03 PM   Specimen: Nasal Mucosa; Nasal Swab  Result Value Ref Range Status   MRSA by PCR Next Gen NOT DETECTED NOT DETECTED Final    Comment: (NOTE) The GeneXpert MRSA Assay (FDA approved for NASAL specimens only), is one component of a comprehensive MRSA colonization surveillance program. It is not intended to diagnose MRSA infection nor to guide or monitor treatment for MRSA infections. Test performance is not FDA approved in patients less than 40 years old. Performed at [facility], [address], [place], [place] [address]     Radiology Studies: No results found.    Hiep Ollis T[facility]  Hospitalist  If 7PM-7AM, please contact night-coverage [email or web address] 09/17/2023, 11:06 AM

## 2023-09-17 NOTE — Care Management Important Message (Signed)
 Important Message  Patient Details  Name: Joel Williams MRN: [id number] Date of Birth: [birth year]   Important Message Given:        [name] 09/17/2023, 12:59 PM

## 2023-09-17 NOTE — Plan of Care (Signed)

## 2023-09-17 NOTE — H&P (Addendum)
 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  Per chart review patient lives with spouse.  1 level home 5 steps entry.  Independent up until recently using a rolling walker.  He does have supportive family in the area.  Presented 09/14/2023 after being seen urgently in the outpatient neurosurgery clinic after outpatient MRI obtained for constitutional symptoms including unintentional weight loss generalized fatigue and dizziness that revealed large peripherally enhancing temporal lesion.  Underwent stereotactic right temporal occipital craniotomy for resection of tumor 09/14/2023 per Dr. Lanis.  Surgical pathology high-grade glioma.  And plan outpatient follow-up with neuro/oncology services.  Patient was cleared to begin subcutaneous heparin  for DVT prophylaxis.  Patient did complete Decadron  taper.  Keppra  added for seizure prophylaxis.  Tolerating a regular consistency diet.  Therapy evaluations completed due to patient's decreased functional mobility was admitted for a comprehensive rehab program. C/o difficulty hearing on the side of his craniotomy.  Review of Systems  Constitutional:  Positive for malaise/fatigue. Negative for fever.  HENT:  Negative for hearing loss.   Eyes:  Negative for blurred vision and double vision.  Respiratory:  Negative for cough.        Occasional shortness of breath with heavy exertion  Cardiovascular:  Negative for chest pain, palpitations and leg swelling.  Gastrointestinal:  Positive for constipation. Negative for heartburn, nausea and vomiting.       GERD  Genitourinary:  Positive for urgency. Negative for dysuria, flank pain and  hematuria.  Musculoskeletal:  Positive for joint pain and myalgias.  Skin:  Negative for rash.  Neurological:  Positive for dizziness, weakness and headaches.  All other systems reviewed and are negative.  Past Medical History:  Diagnosis Date   Allergic rhinitis    Arthritis    ASTHMA 06/08/2008   Asthma    as a child   CAD (coronary artery disease)    a. s/p Promus DES (3.0 x 16 mm) to mid LAD 05/16/10;  b. Myoview  05/15/10: anteroseptal ischemia with EF 52%;   c. Cath 05/16/10: single vessel CAD with mLAD 90% tx with PCI and EF 50%;  d. 07/2011 Cath:  Patent stent, nonobs dzs, NL EF.   Chronic kidney disease    was seen by Dr. Lonna and released.   Colonic polyp    DIABETES MELLITUS, TYPE II 06/08/2008   GERD (gastroesophageal reflux disease)    Hemorrhoids    HYPERLIPIDEMIA 06/08/2008   HYPERTENSION 06/08/2008   Impotence of organic origin 08/24/2008   Myocardial infarction (HCC) 04/16/2010   Pneumonia    hx of   Renal vein thrombosis (HCC)    Seasonal allergies    Past Surgical History:  Procedure Laterality Date   APPLICATION OF CRANIAL NAVIGATION Right 09/14/2023   Procedure: COMPUTER-ASSISTED NAVIGATION, FOR CRANIAL PROCEDURE;  Surgeon: Lanis Pupa, MD;  Location: [facility] OR;  Service: Neurosurgery;  Laterality: Right;   BACK SURGERY  2011   CATARACT EXTRACTION Bilateral    2024   CORONARY ANGIOPLASTY WITH STENT PLACEMENT     CRANIOTOMY Right 09/14/2023   Procedure: STEREOTACTIC RIGHT TEMPRO-OCCIPITAL CRANIOTOMY FOR RESECTION OF TUMOR;  Surgeon: Lanis Pupa, MD;  Location: [facility] OR;  Service: Neurosurgery;  Laterality: Right;  LEFT HEART CATHETERIZATION WITH CORONARY ANGIOGRAM N/A 07/29/2011   Procedure: LEFT HEART CATHETERIZATION WITH CORONARY ANGIOGRAM;  Surgeon: Lonni JONETTA Cash, MD;  Location[facility] C[facility] LAB;  Service: Cardiovascular;  Laterality: N/A;   LEFT HEART CATHETERIZATION WITH CORONARY ANGIOGRAM N/A 06/14/2013   Procedure: LEFT HEART CATHETERIZATION  WITH CORONARY ANGIOGRAM;  Surgeon: Lonni JONETTA Cash, MD;  Location: [facility] CATH LAB;  Service: Cardiovascular;  Laterality: N/A;   RADIOLOGY WITH ANESTHESIA N/A 06/09/2022   Procedure: MRI WITH ANESTHESIA OF LUMBAR SPINE WITHOUT CONTRAST;  Surgeon: Radiologist, Medication, MD;  Location: [facility] OR;  Service: Radiology;  Laterality: N/A;   Family History  Problem Relation Age of Onset   Diabetes Father    Heart disease Father    Angina Father    Dementia Mother    Social History:  reports that he quit smoking about 21 years ago. His smoking use included cigarettes. He started smoking about 74 years ago. He has a 106 pack-year smoking history. He has never used smokeless tobacco. He reports that he does not currently use alcohol. He reports that he does not use drugs. Allergies:  Allergies  Allergen Reactions   Invokana [Canagliflozin]     Yeast infections   Trulicity [Dulaglutide] Other (See Comments)    yeast infections   Medications Prior to Admission  Medication Sig Dispense Refill   albuterol  (VENTOLIN  HFA) 108 (90 Base) MCG/ACT inhaler Inhale 1-2 puffs into the lungs every 6 (six) hours as needed for wheezing or shortness of breath. 8 g 0   amLODipine  (NORVASC ) 10 MG tablet Take 1 tablet (10 mg total) by mouth daily. (Patient taking differently: Take 10 mg by mouth at bedtime.) 90 tablet 3   Ascorbic Acid (VITAMIN C) 1000 MG tablet Take 1,000 mg by mouth in the morning and at bedtime.     aspirin  81 MG tablet Take 1 tablet (81 mg total) by mouth daily. 30 tablet 0   B-D ULTRAFINE III SHORT PEN 31G X 8 MM MISC Inject 1 Syringe into the skin daily.     BIOTIN PO Take 1 tablet by mouth every evening.     Continuous Glucose Sensor (FREESTYLE LIBRE 3 PLUS SENSOR) MISC apply to upper back of arm for 90 days change sensor every 15 days IC-10 CODE: E11.9     glucagon 1 MG injection Inject 1 mg into the vein once as needed (Low blood sugar).      insulin  aspart (NOVOLOG ) 100 UNIT/ML injection  Inject 0-20 Units into the skin 4 (four) times daily -  before meals and at bedtime. CBG 70 - 120: 0 units CBG 121 - 150: 3 units CBG 151 - 200: 4 units CBG 201 - 250: 7 units CBG 251 - 300: 11 units CBG 301 - 350: 15 units CBG 351 - 400: 20 units CBG > 400: call MD and obtain STAT lab verification     insulin  aspart (NOVOLOG ) 100 UNIT/ML injection Inject 5 Units into the skin with breakfast, with lunch, and with evening meal.     insulin  glargine-yfgn (SEMGLEE ) 100 UNIT/ML injection Inject 0.4 mLs (40 Units total) into the skin 2 (two) times daily.     lisinopril  (ZESTRIL ) 40 MG tablet Take 1 tablet (40 mg total) by mouth daily. 90 tablet 3   metFORMIN  (GLUCOPHAGE -XR) 500 MG 24 hr tablet Take 1,000 mg by mouth 2 (two) times daily.     metoprolol  succinate (TOPROL -XL) 50 MG 24 hr tablet Take with or immediately following a meal. 90 tablet  3   Multiple Vitamins-Minerals (PRESERVISION AREDS 2 PO) Take 2 capsules by mouth at bedtime.     Potassium 99 MG TABS Take 99 mg by mouth every evening.     SEMAGLUTIDE , 2 MG/DOSE, [place] Inject 2 mg into the skin every Monday.     senna-docusate (SENOKOT-S) 8.6-50 MG tablet Take 1-2 tablets by mouth 2 (two) times daily between meals as needed for mild constipation or moderate constipation.     simvastatin  (ZOCOR ) 20 MG tablet Take 1 tablet (20 mg total) by mouth daily. 90 tablet 3   VITAMIN D PO Take 5,000 Units by mouth in the morning.     vitamin E 400 UNIT capsule Take 400 Units by mouth in the morning.     zinc  sulfate, 50mg  elemental zinc , 220 (50 Zn) MG capsule Take 220 mg by mouth at bedtime.     Home: Home Living Family/patient expects to be discharged to:: Private residence Living Arrangements: Spouse/significant other Available Help at Discharge: Family, Available 24 hours/day (wife, adult children and family) Type of Home: House Home Access: Stairs to enter Secretary/administrator of Steps: 5 Entrance Stairs-Rails: Right, Left Home Layout: One  level Bathroom Shower/Tub: Health visitor: Handicapped height Bathroom Accessibility: Yes Home Equipment: Agricultural consultant (2 wheels), Information systems manager, Grab bars - tub/shower  Lives With: Spouse   Functional History: Prior Function Prior Level of Function : Independent/Modified Independent, Driving Mobility Comments: A month ago, pt was completely independent without DME; over the past month, the pt has transitioned to needing to use a RW for mobility ADLs Comments: A month ago, pt was completely independent, driving, and cognitively intact; over the past month, his cognition has gradually worsened and now is unable to drive   Functional Status:  Mobility: Bed Mobility Overal bed mobility: Needs Assistance Bed Mobility: Supine to Sit Supine to sit: Mod assist, HOB elevated General bed mobility comments: Pt in recliner on arrival Transfers Overall transfer level: Needs assistance Equipment used: Rolling walker (2 wheels) Transfers: Sit to/from Stand Sit to Stand: Contact guard assist General transfer comment: cues needed for hand placement. Ambulation/Gait Ambulation/Gait assistance: Min assist Gait Distance (Feet): 110 Feet Assistive device: Rolling walker (2 wheels) Gait Pattern/deviations: Step-through pattern, Decreased step length - right, Decreased step length - left, Decreased stride length, Decreased dorsiflexion - right, Decreased dorsiflexion - left, Shuffle, Trunk flexed General Gait Details: Pt takes slow, small steps with a flexed posture. VCs provided to remain proximal to RW, stand upright, and increase bil step length and feet clearance. Momentary success noted. MinA provided for balance. Needs cues to visually scan and find people in the hall Gait velocity: reduced Gait velocity interpretation: <1.31 ft/sec, indicative of household ambulator   ADL: ADL Overall ADL's : Needs assistance/impaired Eating/Feeding: Set up, Sitting Grooming: Oral care,  Standing, Contact guard assist Grooming Details (indicate cue type and reason): Pt needed cues to actually apply more toothpaste, he only had a graze of paste on his brush. Upper Body Bathing: Sitting, Contact guard assist Lower Body Bathing: Sitting/lateral leans, Minimal assistance Upper Body Dressing : Sitting, Contact guard assist Lower Body Dressing: Minimal assistance, Sitting/lateral leans Lower Body Dressing Details (indicate cue type and reason): don socks Toilet Transfer: Contact guard assist, Rolling walker (2 wheels), Ambulation Toileting- Clothing Manipulation and Hygiene: Contact guard assist, Sitting/lateral lean, Sit to/from stand Functional mobility during ADLs: Contact guard assist, Rolling walker (2 wheels) General ADL Comments: Educated pt on visual scanning and anchoring strategies to address visual deficits.  Also discussed fall prevention strategies and adequate lightining with family.   Cognition: Cognition Overall Cognitive Status: Impaired/Different from baseline Arousal/Alertness: Awake/alert Orientation Level: Oriented X4 Cognition Arousal: Alert Behavior During Therapy: WFL for tasks assessed/performed Overall Cognitive Status: Impaired/Different from baseline  Physical Exam: Blood pressure 129/80, pulse 73, temperature 98 F (36.7 C), resp. rate 18, height 5' 9 (1.753 m), weight 99.3 kg, SpO2 91%. Physical Exam HENT:     Head:     Comments: Craniotomy site dressed Gen: no distress, normal appearing HEENT: oral mucosa pink and moist, NCAT Cardio: Reg rate Chest: normal effort, normal rate of breathing Abd: soft, non-distended Ext: no edema Psych: pleasant, normal affect Skin: intact Neurological:     Comments: Patient is alert sitting up in chair.  Makes eye contact with examiner.  Follows simple commands.  He provides name age and date but had some difficulty with hospital course. LUE 4/5, LLE 4/5, impaired FTN  Results for orders placed or  performed during the hospital encounter of 09/17/23 (from the past 48 hours)  CBC     Status: Abnormal   Collection Time: 09/17/23  2:06 PM  Result Value Ref Range   WBC 5.9 4.0 - 10.5 K/uL   RBC 4.57 4.22 - 5.81 MIL/uL   Hemoglobin 12.9 (L) 13.0 - 17.0 g/dL   HCT 61.6 (L) 60.9 - 47.9 %   MCV 83.8 80.0 - 100.0 fL   MCH 28.2 26.0 - 34.0 pg   MCHC 33.7 30.0 - 36.0 g/dL   RDW 85.8 88.4 - 84.4 %   Platelets 174 150 - 400 K/uL   nRBC 0.0 0.0 - 0.2 %    Comment: Performed at [facility], [address] [place], [place] [address]  Creatinine, serum     Status: None   Collection Time: 09/17/23  2:06 PM  Result Value Ref Range   Creatinine, Ser 0.75 0.61 - 1.24 mg/dL   GFR, Estimated >39 >39 mL/min    Comment: (NOTE) Calculated using the CKD-EPI Creatinine Equation (2021) Performed at [facility], [address] [place], [place] [address]   Glucose, capillary     Status: Abnormal   Collection Time: 09/17/23  4:27 PM  Result Value Ref Range   Glucose-Capillary 109 (H) 70 - 99 mg/dL    Comment: Glucose reference range applies only to samples taken after fasting for at least 8 hours.   No results found.    Blood pressure 129/80, pulse 73, temperature 98 F (36.7 C), resp. rate 18, height 5' 9 (1.753 m), weight 99.3 kg, SpO2 91%.  Medical Problem List and Plan: 1. 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  Status post right temporal occipital craniotomy for resection of tumor.  Path report high-grade glioma.  Follow-up neuro-oncology outpatient.  Decadron  taper completed  -patient may shower  -ELOS/Goals: 10-14 days  Admit to CIR  2.  Antithrombotics: -DVT/anticoagulation:  Pharmaceutical: Heparin  initiated 09/16/2023  -antiplatelet therapy: Aspirin  on hold due to craniotomy  3. Pain Management: continue Hydrocodone  as needed  4. Mood/Behavior/Sleep: Provide emotional support  -antipsychotic agents: N/A 5. Neuropsych/cognition: This patient is not  capable of making decisions on his own behalf.  6. Skin/Wound Care: Routine skin checks  7. Fluids/Electrolytes/Nutrition: Routine in and outs with follow-up chemistries  8.  Seizure prophylaxis.  Continue 500 mg twice daily  9.  Hypertension. continue Avapro  150 mg daily, Norvasc  10 mg nightly, Toprol -XL 50 mg daily.  Monitor with increased mobility 10.  Diabetes  mellitus.  Patient with insulin  pump PTA as well as Glucophage  1000 mg twice daily and semaglutide .  Currently on NovoLog  5 units 3 times daily, Semglee  40 units twice daily. 11.  Hyperlipidemia.  Zocor  12.  CKD.  Seen by renal services in the past and has since signed off.  Follow-up chemistries 13.  CAD.  Low-dose aspirin  on hold.  No chest pain or shortness of breath 14.  GERD.  Protonix  15.  History of asthma.  Albuterol  inhaler as needed. 16.  Class I obesity.  BMI 32.33.  Dietary follow-up  I have personally performed a face to face diagnostic evaluation, including, but not limited to relevant history and physical exam findings, of this patient and developed relevant assessment and plan.  Additionally, I have reviewed and concur with the physician assistant's documentation above.  Toribio PARAS Angiulli, PA-C   Brok Stocking P Melessia Kaus, MD 09/17/2023

## 2023-09-17 NOTE — Plan of Care (Signed)
   Problem: Education: Goal: Knowledge of General Education information will improve Description: Including pain rating scale, medication(s)/side effects and non-pharmacologic comfort measures Outcome: Progressing   Problem: Activity: Goal: Risk for activity intolerance will decrease Outcome: Progressing   Problem: Coping: Goal: Level of anxiety will decrease Outcome: Progressing

## 2023-09-17 NOTE — Progress Notes (Signed)
 Inpatient Rehabilitation Admission Medication Review by a Pharmacist  A complete drug regimen review was completed for this patient to identify any potential clinically significant medication issues.  High Risk Drug Classes Is patient taking? Indication by Medication  Antipsychotic No   Anticoagulant Yes [place] heparin - DVT ppx   Antibiotic No   Opioid Yes Norco PRN- acute pain   Antiplatelet No   Hypoglycemics/insulin  Yes SSI, aspart TID WC, largine- DM   Vasoactive Medication Yes Amlodipine , irbesartan , Toprol  -HTN   Chemotherapy No   Other Yes Senokot-S  and - constipation Pantoprazole - reflux  Acetaminophen - pain  Zofran - n/v   Albuterol - SOB  Keppra - sz ppx  MVI- supplement Simvastatin - HLD      Type of Medication Issue Identified Description of Issue Recommendation(s)  Drug Interaction(s) (clinically significant)     Duplicate Therapy     Allergy     No Medication Administration End Date  Keppra     Incorrect Dose     Additional Drug Therapy Needed     Significant med changes from prior encounter (inform family/care partners about these prior to discharge). HELD- bASA, semaglutide , VitD, metformin   Communicate relevant medication changes to patient/family members at discharge from CIR.   Restart or discontinue PTA meds not resumed in CIR at discharge if clinically indicated.   Other       Clinically significant medication issues were identified that warrant physician communication and completion of prescribed/recommended actions by midnight of the next day:  Yes  Messaged Rolan Pitch, PA via secure chat regarding an order for Kcl 40 meq x2 for a K of 2.9. It appeared patient had already received this supplementation whilst inpt. After discussion, decision made to give an additional dose at 1500 for 120 mEQ total.   Pharmacist comments:   Time spent performing this drug regimen review (minutes):  45  Massie Fila, PharmD Clinical Pharmacist  09/17/2023 3:01 PM

## 2023-09-18 DIAGNOSIS — I10 Essential (primary) hypertension: Secondary | ICD-10-CM

## 2023-09-18 DIAGNOSIS — E876 Hypokalemia: Secondary | ICD-10-CM | POA: Diagnosis not present

## 2023-09-18 DIAGNOSIS — D62 Acute posthemorrhagic anemia: Secondary | ICD-10-CM | POA: Diagnosis not present

## 2023-09-18 DIAGNOSIS — R739 Hyperglycemia, unspecified: Secondary | ICD-10-CM

## 2023-09-18 DIAGNOSIS — C719 Malignant neoplasm of brain, unspecified: Secondary | ICD-10-CM | POA: Diagnosis not present

## 2023-09-18 LAB — GLUCOSE, CAPILLARY
Glucose-Capillary: 119 mg/dL — ABNORMAL HIGH (ref 70–99)
Glucose-Capillary: 132 mg/dL — ABNORMAL HIGH (ref 70–99)
Glucose-Capillary: 149 mg/dL — ABNORMAL HIGH (ref 70–99)
Glucose-Capillary: 154 mg/dL — ABNORMAL HIGH (ref 70–99)
Glucose-Capillary: 218 mg/dL — ABNORMAL HIGH (ref 70–99)
Glucose-Capillary: 70 mg/dL (ref 70–99)

## 2023-09-18 MED ORDER — ACETAMINOPHEN 325 MG PO TABS: 325.0000 mg | ORAL_TABLET | ORAL | Status: DC | PRN

## 2023-09-18 MED ORDER — ACETAMINOPHEN 650 MG RE SUPP: 325.0000 mg | RECTAL | Status: DC | PRN

## 2023-09-18 NOTE — Plan of Care (Signed)
  Problem: Consults Goal: RH BRAIN INJURY PATIENT EDUCATION Description: Description: See Patient Education module for eduction specifics Outcome: Progressing   Problem: RH BOWEL ELIMINATION Goal: RH STG MANAGE BOWEL WITH ASSISTANCE Description: STG Manage Bowel with mod I Assistance. Outcome: Progressing Goal: RH STG MANAGE BOWEL W/MEDICATION W/ASSISTANCE Description: STG Manage Bowel with Medication with mod I Assistance. Outcome: Progressing   Problem: RH BLADDER ELIMINATION Goal: RH STG MANAGE BLADDER WITH ASSISTANCE Description: STG Manage Bladder With  toileting Assistance Outcome: Progressing   Problem: RH SAFETY Goal: RH STG ADHERE TO SAFETY PRECAUTIONS W/ASSISTANCE/DEVICE Description: STG Adhere to Safety Precautions With Assistance/Device. Outcome: Progressing   Problem: RH PAIN MANAGEMENT Goal: RH STG PAIN MANAGED AT OR BELOW PT'S PAIN GOAL Description: < 4 with prns Outcome: Progressing   Problem: RH KNOWLEDGE DEFICIT BRAIN INJURY Goal: RH STG INCREASE KNOWLEDGE OF SELF CARE AFTER BRAIN INJURY Description: Patient and Spouse will be able to manage care using educational resources for medications and dietary modification independently Outcome: Progressing

## 2023-09-18 NOTE — Progress Notes (Addendum)
 PROGRESS NOTE   Subjective/Complaints:  Pt doing ok, but slept poorly d/t interruptions. Declines wanting meds for sleep. Pain overall managed. LBM yesterday. Urinating fine. Frustrated that he can't use his CBG monitor, advised to bring it in so we can use it. Was on insulin  pump, discussed why that might be harder to use. No other complaints or concerns.   ROS: as per HPI. Denies CP, SOB, abd pain, N/V/D/C, or any other complaints at this time.    Objective:   No results found. Recent Labs    09/17/23 0402 09/17/23 1406  WBC 7.3 5.9  HGB 13.5 12.9*  HCT 40.8 38.3*  PLT 167 174   Recent Labs    09/17/23 0402 09/17/23 1406  NA 138  --   K 2.9*  --   CL 101  --   CO2 29  --   GLUCOSE 103*  --   BUN 13  --   CREATININE 0.74 0.75  CALCIUM 8.6*  --         Intake/Output Summary (Last 24 hours) at 09/18/2023 1231 Last data filed at 09/18/2023 0717 Gross per 24 hour  Intake 716 ml  Output --  Net 716 ml        Physical Exam: Vital Signs Blood pressure 134/79, pulse 75, temperature 97.7 F (36.5 C), temperature source Oral, resp. rate 16, height 5' 9 (1.753 m), weight 99.3 kg, SpO2 96%.   Gen: no distress, normal appearing, sitting up in w/c HEENT: oral mucosa pink and moist, a little HOH, R Craniotomy site dressed Cardio: Reg rate and rhythm, no m/r/g Chest: normal effort, normal rate of breathing, CTAB Abd: soft, non-distended, nonTTP, +BS throughout Ext: no edema Psych: a little irritable but cooperative Skin: intact except R crani incision, covered.   PRIOR EXAMS: Neurological:     Comments: Patient is alert sitting up in chair.  Makes eye contact with examiner.  Follows simple commands.  He provides name age and date but had some difficulty with hospital course. LUE 4/5, LLE 4/5, impaired FTN  Assessment/Plan: 1. Functional deficits which require 3+ hours per day of interdisciplinary therapy in a  comprehensive inpatient rehab setting. Physiatrist is providing close team supervision and 24 hour management of active medical problems listed below. Physiatrist and rehab team continue to assess barriers to discharge/monitor patient progress toward functional and medical goals  Care Tool:  Bathing    Body parts bathed by patient: Right arm, Left arm, Chest, Abdomen, Front perineal area, Buttocks, Right upper leg, Left upper leg, Right lower leg, Left lower leg, Face         Bathing assist Assist Level: Contact Guard/Touching assist     Upper Body Dressing/Undressing Upper body dressing   What is the patient wearing?: Pull over shirt    Upper body assist Assist Level: Supervision/Verbal cueing    Lower Body Dressing/Undressing Lower body dressing      What is the patient wearing?: Underwear/pull up, Pants     Lower body assist Assist for lower body dressing: Contact Guard/Touching assist     Toileting Toileting    Toileting assist Assist for toileting: Contact Guard/Touching assist  Transfers Chair/bed transfer  Transfers assist           Locomotion Ambulation   Ambulation assist              Walk 10 feet activity   Assist           Walk 50 feet activity   Assist           Walk 150 feet activity   Assist           Walk 10 feet on uneven surface  activity   Assist           Wheelchair     Assist               Wheelchair 50 feet with 2 turns activity    Assist            Wheelchair 150 feet activity     Assist          Blood pressure 134/79, pulse 75, temperature 97.7 F (36.5 C), temperature source Oral, resp. rate 16, height 5' 9 (1.753 m), weight 99.3 kg, SpO2 96%.  Medical Problem List and Plan: 1. 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  Status post right temporal occipital craniotomy for resection of tumor.  Path report high-grade glioma.  Follow-up  neuro-oncology outpatient.  Decadron  taper completed             -patient may shower             -ELOS/Goals: 10-14 days             -Continue CIR   2.  Antithrombotics: -DVT/anticoagulation:  Pharmaceutical: Heparin  5000U q8h initiated 09/16/2023             -antiplatelet therapy: Aspirin  on hold due to craniotomy   3. Pain Management: continue tylenol  and Hydrocodone  as needed-- decreased tylenol  PRN to avoid overdosing since hydrocodone  also has 325mg .    4. Mood/Behavior/Sleep: Provide emotional support             -antipsychotic agents: N/A  -09/18/23 poor sleep but doesn't want meds; monitor for now  5. Neuropsych/cognition: This patient is not capable of making decisions on his own behalf.   6. Skin/Wound Care: Routine skin checks   7. Fluids/Electrolytes/Nutrition: Routine in and outs with follow-up chemistries -Hypokalemia: K 2.9 on admission, given KCL 40mEq x3, recheck Monday.    8.  Seizure prophylaxis.  Continue Keppra  500 mg twice daily   9.  Hypertension. continue Avapro  150 mg daily, Norvasc  10 mg nightly, Toprol -XL 50 mg daily.  Monitor with increased mobility  -09/18/23 BPs fine, monitor  Vitals:   09/17/23 1344 09/17/23 2021 09/18/23 0440  BP: 127/69 129/80 134/79    10.  Diabetes mellitus.  Patient with insulin  pump PTA as well as Glucophage  1000 mg twice daily and semaglutide .  Currently on NovoLog  5 units 3 times daily, Semglee  40 units twice daily. And SSI -09/18/23 CBGs great, advised that if he brings his freestyle monitor, we can confirm accuracy and use that for CBG checks. Continue current regimen.  CBG (last 3)  Recent Labs    09/18/23 0002 09/18/23 0603 09/18/23 1124  GLUCAP 119* 132* 154*    11.  Hyperlipidemia.  Zocor  20mg  daily  12.  CKD.  Seen by renal services in the past and has since signed off.  Follow-up chemistries  13.  CAD.  Low-dose aspirin  on hold.  No chest pain or shortness of breath  14.  GERD.  Protonix  40mg  nightly  15.   History of asthma.  Albuterol  inhaler as needed.  16.  Class I obesity.  BMI 32.33.  Dietary follow-up  17. ABLA: Hgb 12.9 7/4, monitor routine labs  I spent >32mins performing patient care related activities, including prolonged face to face time, documentation time, review of labs/meds/med management, discussion of labs/meds with patient, and overall coordination of care.     LOS: 1 days A FACE TO FACE EVALUATION WAS PERFORMED  [address] Karon Heckendorn 09/18/2023, 12:31 PM

## 2023-09-18 NOTE — Progress Notes (Signed)
 Physical Therapy Session Note  Patient Details  Name: Joel Williams MRN: [id number] Date of Birth: 08-23-1950  Today's Date: 09/18/2023 PT Individual Time: 1400-1505 PT Individual Time Calculation (min): 65 min   Short Term Goals: Week 1:  PT Short Term Goal 1 (Week 1): Pt will perform bed mobility with overall supervision. PT Short Term Goal 2 (Week 1): Pt will perform all transfers with no AD and overall supervision. PT Short Term Goal 3 (Week 1): Pt will ambulate community distances with no AD and minimal path deviations with supervision/ CGA. PT Short Term Goal 4 (Week 1): Pt will safely navigate steps as per home environment with close supervision.  Skilled Therapeutic Interventions/Progress Updates:  Patient seated upright in w/c on entrance to room. Patient alert and agreeable to PT session.   Patient with no pain complaint at start of session.  Therapeutic Activity: Transfers: Pt performed sit<>stand and stand pivot transfers with close supervision throughout session. VC provided re: not needing to back legs up to seat prior to sitting. Pt is able to look for seat and reach with hands. Should depend on these in order to help build confidence.   Gait Training:  Pt ambulated community distances using RW with close supervision but also ambulates 120' x1/ 95' x1 without AD and close supervision. Demonstrated only slight increase in path deviation without use of AD.   Neuromuscular Re-ed: NMR facilitated during session with focus on standing balance. Pt guided in assessment in outcome measures including Berg Balance Test and Functional Gait Assessment. Patient demonstrates increased fall risk as noted by score of  37/56 on Berg Balance Scale.  (<36= high risk for falls, close to 100%; 37-45 significant >80%; 46-51 moderate >50%; 52-55 lower >25%) hifhest difficulty with NBOS items.   Patient demonstrates increased fall risk as noted by score of 17/30 on  Functional Gait Assessment.    <22/30 = predictive of falls, <20/30 = fall in 6 months, <18/30 = predictive of falls in PD MCID: 5 points stroke population, 4 points geriatric population (ANPTA Core Set of Outcome Measures for Adults with Neurologic Conditions, 2018)   NMR performed for improvements in motor control and coordination, balance, sequencing, judgement, and self confidence/ efficacy in performing all aspects of mobility at highest level of independence.   Patient seated upright in w/c at end of session with brakes locked, belt alarm set, and all needs within reach. Wife present.    Therapy Documentation Precautions:  Precautions Precautions: Fall Precaution/Restrictions Comments: R craniotomy; HOH; L-sided weakness Restrictions Weight Bearing Restrictions Per Provider Order: No General:    Balance: Balance Assessed: Yes Standardized Balance Assessment Standardized Balance Assessment: Berg Balance Test;Functional Gait Assessment Berg Balance Test Sit to Stand: Able to stand  independently using hands Standing Unsupported: Able to stand 30 seconds unsupported Sitting with Back Unsupported but Feet Supported on Floor or Stool: Able to sit safely and securely 2 minutes Stand to Sit: Controls descent by using hands Transfers: Able to transfer safely, definite need of hands Standing Unsupported with Eyes Closed: Able to stand 10 seconds with supervision Standing Ubsupported with Feet Together: Needs help to attain position but able to stand for 30 seconds with feet together From Standing, Reach Forward with Outstretched Arm: Can reach forward >12 cm safely (5) From Standing Position, Pick up Object from Floor: Able to pick up shoe, needs supervision From Standing Position, Turn to Look Behind Over each Shoulder: Looks behind from both sides and weight shifts well Turn 360 Degrees:  Able to turn 360 degrees safely but slowly Standing Unsupported, Alternately Place Feet on Step/Stool: Able to complete 4  steps without aid or supervision Standing Unsupported, One Foot in Front: Able to take small step independently and hold 30 seconds Standing on One Leg: Able to lift leg independently and hold equal to or more than 3 seconds Total Score: 37 Functional Gait  Assessment Gait assessed : Yes Gait Level Surface: Walks 20 ft in less than 7 sec but greater than 5.5 sec, uses assistive device, slower speed, mild gait deviations, or deviates 6-10 in outside of the 12 in walkway width. Change in Gait Speed: Able to change speed, demonstrates mild gait deviations, deviates 6-10 in outside of the 12 in walkway width, or no gait deviations, unable to achieve a major change in velocity, or uses a change in velocity, or uses an assistive device. Gait with Horizontal Head Turns: Performs head turns smoothly with slight change in gait velocity (eg, minor disruption to smooth gait path), deviates 6-10 in outside 12 in walkway width, or uses an assistive device. Gait with Vertical Head Turns: Performs task with slight change in gait velocity (eg, minor disruption to smooth gait path), deviates 6 - 10 in outside 12 in walkway width or uses assistive device Gait and Pivot Turn: Turns slowly, requires verbal cueing, or requires several small steps to catch balance following turn and stop Step Over Obstacle: Is able to step over one shoe box (4.5 in total height) but must slow down and adjust steps to clear box safely. May require verbal cueing. Gait with Narrow Base of Support: Ambulates 4-7 steps. Gait with Eyes Closed: Walks 20 ft, uses assistive device, slower speed, mild gait deviations, deviates 6-10 in outside 12 in walkway width. Ambulates 20 ft in less than 9 sec but greater than 7 sec. Ambulating Backwards: Walks 20 ft, uses assistive device, slower speed, mild gait deviations, deviates 6-10 in outside 12 in walkway width. Steps: Alternating feet, must use rail. Total Score: 17  Pain: No pain indicate by pt  this session.    Therapy/Group: Individual Therapy  Mliss DELENA Milliner PT, DPT, CSRS 09/18/2023, 3:46 PM

## 2023-09-18 NOTE — Plan of Care (Signed)
  Problem: RH Balance Goal: LTG Patient will maintain dynamic standing with ADLs (OT) Description: LTG:  Patient will maintain dynamic standing balance with assist during activities of daily living (OT)  Flowsheets (Taken 09/18/2023 1234) LTG: Pt will maintain dynamic standing balance during ADLs with: Supervision/Verbal cueing   Problem: RH Bathing Goal: LTG Patient will bathe all body parts with assist levels (OT) Description: LTG: Patient will bathe all body parts with assist levels (OT) Flowsheets (Taken 09/18/2023 1234) LTG: Pt will perform bathing with assistance level/cueing: Supervision/Verbal cueing   Problem: RH Dressing Goal: LTG Patient will perform lower body dressing w/assist (OT) Description: LTG: Patient will perform lower body dressing with assist, with/without cues in positioning using equipment (OT) Flowsheets (Taken 09/18/2023 1234) LTG: Pt will perform lower body dressing with assistance level of: Supervision/Verbal cueing   Problem: RH Toileting Goal: LTG Patient will perform toileting task (3/3 steps) with assistance level (OT) Description: LTG: Patient will perform toileting task (3/3 steps) with assistance level (OT)  Flowsheets (Taken 09/18/2023 1234) LTG: Pt will perform toileting task (3/3 steps) with assistance level: Supervision/Verbal cueing   Problem: RH Functional Use of Upper Extremity Goal: LTG Patient will use RT/LT upper extremity as a (OT) Description: LTG: Patient will use right/left upper extremity as a stabilizer/gross assist/diminished/nondominant/dominant level with assist, with/without cues during functional activity (OT) Flowsheets (Taken 09/18/2023 1234) LTG: Use of upper extremity in functional activities: LUE as nondominant level LTG: Pt will use upper extremity in functional activity with assistance level of: Independent   Problem: RH Toilet Transfers Goal: LTG Patient will perform toilet transfers w/assist (OT) Description: LTG: Patient will  perform toilet transfers with assist, with/without cues using equipment (OT) Flowsheets (Taken 09/18/2023 1234) LTG: Pt will perform toilet transfers with assistance level of: Supervision/Verbal cueing   Problem: RH Tub/Shower Transfers Goal: LTG Patient will perform tub/shower transfers w/assist (OT) Description: LTG: Patient will perform tub/shower transfers with assist, with/without cues using equipment (OT) Flowsheets (Taken 09/18/2023 1234) LTG: Pt will perform tub/shower stall transfers with assistance level of: Supervision/Verbal cueing   Problem: RH Memory Goal: LTG Patient will demonstrate ability for day to day recall/carry over during activities of daily living with assistance level (OT) Description: LTG:  Patient will demonstrate ability for day to day recall/carry over during activities of daily living with assistance level (OT). Flowsheets (Taken 09/18/2023 1234) LTG:  Patient will demonstrate ability for day to day recall/carry over during activities of daily living with assistance level (OT): Modified Independent   Problem: RH Attention Goal: LTG Patient will demonstrate this level of attention during functional activites (OT) Description: LTG:  Patient will demonstrate this level of attention during functional activites  (OT) Flowsheets (Taken 09/18/2023 1234) Patient will demonstrate this level of attention during functional activites: Selective Patient will demonstrate above attention level in the following environment: Home LTG: Patient will demonstrate this level of attention during functional activites (OT): Modified Independent   Problem: RH Awareness Goal: LTG: Patient will demonstrate awareness during functional activites type of (OT) Description: LTG: Patient will demonstrate awareness during functional activites type of (OT) Flowsheets (Taken 09/18/2023 1234) Patient will demonstrate awareness during functional activites type of: Emergent LTG: Patient will demonstrate  awareness during functional activites type of (OT): Supervision

## 2023-09-18 NOTE — Evaluation (Signed)
 Occupational Therapy Assessment and Plan  Patient Details  Name: Joel Williams MRN: [id number] Date of Birth: 1950-11-15  OT Diagnosis: acute pain, cognitive deficits, muscle weakness (generalized), and decreased activity tolerance Rehab Potential: Rehab Potential (ACUTE ONLY): Good ELOS: 10-12 days   Today's Date: 09/18/2023 OT Individual Time: 0822-0925 OT Individual Time Calculation (min): 63 min     Hospital Problem: Principal Problem:   Glioma (HCC)   Past Medical History:  Past Medical History:  Diagnosis Date   Allergic rhinitis    Arthritis    ASTHMA 06/08/2008   Asthma    as a child   CAD (coronary artery disease)    a. s/p Promus DES (3.0 x 16 mm) to mid LAD 05/16/10;  b. Myoview  05/15/10: anteroseptal ischemia with EF 52%;   c. Cath 05/16/10: single vessel CAD with mLAD 90% tx with PCI and EF 50%;  d. 07/2011 Cath:  Patent stent, nonobs dzs, NL EF.   Chronic kidney disease    was seen by Dr. Lonna and released.   Colonic polyp    DIABETES MELLITUS, TYPE II 06/08/2008   GERD (gastroesophageal reflux disease)    Hemorrhoids    HYPERLIPIDEMIA 06/08/2008   HYPERTENSION 06/08/2008   Impotence of organic origin 08/24/2008   Myocardial infarction (HCC) 04/16/2010   Pneumonia    hx of   Renal vein thrombosis (HCC)    Seasonal allergies    Past Surgical History:  Past Surgical History:  Procedure Laterality Date   APPLICATION OF CRANIAL NAVIGATION Right 09/14/2023   Procedure: COMPUTER-ASSISTED NAVIGATION, FOR CRANIAL PROCEDURE;  Surgeon: Lanis Pupa, MD;  Location: [facility] OR;  Service: Neurosurgery;  Laterality: Right;   BACK SURGERY  2011   CATARACT EXTRACTION Bilateral    2024   CORONARY ANGIOPLASTY WITH STENT PLACEMENT     CRANIOTOMY Right 09/14/2023   Procedure: STEREOTACTIC RIGHT TEMPRO-OCCIPITAL CRANIOTOMY FOR RESECTION OF TUMOR;  Surgeon: Lanis Pupa, MD;  Location: [facility] OR;  Service: Neurosurgery;  Laterality: Right;   LEFT HEART CATHETERIZATION  WITH CORONARY ANGIOGRAM N/A 07/29/2011   Procedure: LEFT HEART CATHETERIZATION WITH CORONARY ANGIOGRAM;  Surgeon: Lonni JONETTA Cash, MD;  Location: [facility] CATH LAB;  Service: Cardiovascular;  Laterality: N/A;   LEFT HEART CATHETERIZATION WITH CORONARY ANGIOGRAM N/A 06/14/2013   Procedure: LEFT HEART CATHETERIZATION WITH CORONARY ANGIOGRAM;  Surgeon: Lonni JONETTA Cash, MD;  Location: [facility] CATH LAB;  Service: Cardiovascular;  Laterality: N/A;   RADIOLOGY WITH ANESTHESIA N/A 06/09/2022   Procedure: MRI WITH ANESTHESIA OF LUMBAR SPINE WITHOUT CONTRAST;  Surgeon: Radiologist, Medication, MD;  Location: [facility] OR;  Service: Radiology;  Laterality: N/A;    Assessment & Plan Clinical Impression: Patient is a 73 year old right-handed male with history significant for asthma and quit smoking 21 years ago, CAD maintained on low-dose aspirin , CKD stage III, renal vein thrombosis seen by renal services in the past and since signed off, diabetes mellitus with insulin  pump, hypertension, hyperlipidemia.  Per chart review patient lives with spouse.  1 level home 5 steps entry.  Independent up until recently using a rolling walker.  He does have supportive family in the area.  Presented 09/14/2023 after being seen urgently in the outpatient neurosurgery clinic after outpatient MRI obtained for constitutional symptoms including unintentional weight loss generalized fatigue and dizziness that revealed large peripherally enhancing temporal lesion.  Underwent stereotactic right temporal occipital craniotomy for resection of tumor 09/14/2023 per Dr. Lanis.  Surgical pathology high-grade glioma.  And plan outpatient follow-up with neuro/oncology services.  Patient was cleared  to begin subcutaneous heparin  for DVT prophylaxis.  Patient did complete Decadron  taper.  Keppra  added for seizure prophylaxis.  Tolerating a regular consistency diet.  Therapy evaluations completed due to patient's decreased functional mobility was admitted  for a comprehensive rehab program. C/o difficulty hearing on the side of his craniotomy.  Patient transferred to CIR on 09/17/2023 .    Patient currently requires CGA-Min A with basic self-care skills secondary to muscle weakness, decreased cardiorespiratoy endurance, decreased attention to left, decreased awareness, decreased problem solving, decreased safety awareness, and decreased memory, and decreased standing balance and decreased balance strategies.  Prior to hospitalization, patient could complete BADLs with Mod I.   Patient will benefit from skilled intervention to increase independence with basic self-care skills prior to discharge home with care partner.  Anticipate patient will require 24 hour supervision and follow up outpatient.  OT - End of Session Activity Tolerance: Tolerates 10 - 20 min activity with multiple rests Endurance Deficit: Yes OT Assessment Rehab Potential (ACUTE ONLY): Good OT Barriers to Discharge: Wound Care OT Patient demonstrates impairments in the following area(s): Balance;Cognition;Endurance;Motor;Pain;Safety;Skin Integrity;Vision OT Basic ADL's Functional Problem(s): Bathing;Dressing;Toileting OT Transfers Functional Problem(s): Toilet;Tub/Shower OT Additional Impairment(s): Fuctional Use of Upper Extremity OT Plan OT Intensity: Minimum of 1-2 x/day, 45 to 90 minutes OT Frequency: 5 out of 7 days OT Duration/Estimated Length of Stay: 10-12 days OT Treatment/Interventions: Balance/vestibular training;DME/adaptive equipment instruction;Cognitive remediation/compensation;Community reintegration;Discharge planning;Disease mangement/prevention;Functional electrical stimulation;Functional mobility training;Neuromuscular re-education;Pain management;Patient/family education;Psychosocial support;Self Care/advanced ADL retraining;Skin care/wound managment;Therapeutic Activities;Therapeutic Exercise;UE/LE Strength taining/ROM;UE/LE Coordination  activities;Visual/perceptual remediation/compensation OT Basic Self-Care Anticipated Outcome(s): Supervision OT Toileting Anticipated Outcome(s): Supervision OT Bathroom Transfers Anticipated Outcome(s): Supervision OT Recommendation Patient destination: Home Follow Up Recommendations: Outpatient OT Equipment Recommended: To be determined   OT Evaluation Precautions/Restrictions  Precautions Precautions: Fall Precaution/Restrictions Comments: R craniotomy; HOH; L-sided weakness Restrictions Weight Bearing Restrictions Per Provider Order: No General Chart Reviewed: Yes Family/Caregiver Present: Yes (Spouse, [name]) Pain Pain Assessment Pain Scale: 0-10 Pain Score: 10-Worst pain ever Pain Type: Acute pain Pain Location: Head Pain Orientation: Right Pain Descriptors / Indicators: Aching Pain Intervention(s): Medication (See eMAR) Home Living/Prior Functioning Home Living Family/patient expects to be discharged to:: Private residence Living Arrangements: Spouse/significant other Available Help at Discharge: Family, Available 24 hours/day Type of Home: House Home Access: Stairs to enter [employer] of Steps: 3 STE + threshold Entrance Stairs-Rails: Right, Left Home Layout: One level Bathroom Shower/Tub: Walk-in shower (TTB, x2 grab bars, and detachable shower head) Bathroom Toilet: Handicapped height Bathroom Accessibility: Yes  Lives With: Spouse IADL History Current License: Yes Occupation: Retired Prior Function Level of Independence: Independent with basic ADLs, Independent with homemaking with ambulation, Independent with gait, Independent with transfers (Began using RW a couple of weeks ago.) Vocation: Retired Administrator, sports Baseline Vision/History: 1 Wears glasses (Readers) Ability to See in Adequate Light: 0 Adequate Patient Visual Report: No change from baseline Vision Assessment?: Yes Eye Alignment: Within Functional Limits Ocular Range of Motion: Within  Functional Limits Alignment/Gaze Preference: Within Defined Limits Tracking/Visual Pursuits: Able to track stimulus in all quads without difficulty Saccades: Within functional limits Convergence: Within functional limits Perception  Perception: Impaired Perception-Other Comments: Mild L-sided inattention Praxis Praxis: Impaired Praxis Impairment Details: Organization Cognition Cognition Overall Cognitive Status: Impaired/Different from baseline Arousal/Alertness: Awake/alert Orientation Level: Place;Situation Memory: Impaired Memory Impairment: Retrieval deficit;Decreased recall of new information Attention: Selective Selective Attention: Impaired Awareness: Impaired Awareness Impairment: Emergent impairment Problem Solving: Impaired Problem Solving Impairment: Functional complex;Functional basic Executive Function: Sequencing;Organizing;Decision Making Sequencing: Impaired Organizing:  Impaired Decision Making: Impaired Behaviors: Poor frustration tolerance;Lability;Impulsive (Mild presence of impulsivity, liability, and poor frustration tolerance.) Safety/Judgment: Impaired Comments: Fluctuating awareness of deficits. Brief Interview for Mental Status (BIMS) Repetition of Three Words (First Attempt): 3 Temporal Orientation: Year: Correct Temporal Orientation: Month: Accurate within 5 days Temporal Orientation: Day: Correct Recall: Sock: Yes, no cue required Recall: Blue: Yes, no cue required Recall: Bed: Yes, no cue required BIMS Summary Score: 15 Sensation Sensation Light Touch: Appears Intact Coordination Gross Motor Movements are Fluid and Coordinated: No Fine Motor Movements are Fluid and Coordinated: No Coordination and Movement Description: Deficits due to generalized weakness and decreased balance strategies; Mild dysmetria in LUE/essential tremors in BUE; Mild L-sided inattention Finger Nose Finger Test: Mildly dysmetric with LUE Motor  Motor Motor:  Other (comment) Motor - Skilled Clinical Observations: Deficits due to generalized weakness and decreased balance strategies; Mild dysmetria in LUE/essential tremors in BUE; Mild L-sided inattention  Trunk/Postural Assessment  Cervical Assessment Cervical Assessment: Within Functional Limits Thoracic Assessment Thoracic Assessment: Within Functional Limits Lumbar Assessment Lumbar Assessment: Within Functional Limits Postural Control Postural Control: Deficits on evaluation Righting Reactions: Decreased/Delayed Protective Responses: Decreased/Delayed  Balance Balance Balance Assessed: Yes Static Sitting Balance Static Sitting - Balance Support: Feet supported Static Sitting - Level of Assistance: 7: Independent Dynamic Sitting Balance Dynamic Sitting - Balance Support: During functional activity Dynamic Sitting - Level of Assistance: 6: Modified independent (Device/Increase time) Static Standing Balance Static Standing - Balance Support: No upper extremity supported;During functional activity Static Standing - Level of Assistance: 5: Stand by assistance (SUP-CGA) Dynamic Standing Balance Dynamic Standing - Balance Support: During functional activity;No upper extremity supported Dynamic Standing - Level of Assistance: 5: Stand by assistance (SUP-CGA) Extremity/Trunk Assessment RUE Assessment RUE Assessment: Within Functional Limits (Baseline tremors) LUE Assessment LUE Assessment: Exceptions to [facility] (Baseline tremors) Active Range of Motion (AROM) Comments: WFL General Strength Comments: 3+/5; Mild dysmetria  Care Tool Care Tool Self Care Eating   Eating Assist Level: Set up assist    Oral Care    Oral Care Assist Level: Supervision/Verbal cueing    Bathing   Body parts bathed by patient: Right arm;Left arm;Chest;Abdomen;Front perineal area;Buttocks;Right upper leg;Left upper leg;Right lower leg;Left lower leg;Face     Assist Level: Contact Guard/Touching assist     Upper Body Dressing(including orthotics)   What is the patient wearing?: Pull over shirt   Assist Level: Supervision/Verbal cueing    Lower Body Dressing (excluding footwear)   What is the patient wearing?: Underwear/pull up;Pants Assist for lower body dressing: Contact Guard/Touching assist    Putting on/Taking off footwear   What is the patient wearing?: Socks;Shoes Assist for footwear: Minimal Assistance - Patient > 75%       Care Tool Toileting Toileting activity   Assist for toileting: Contact Guard/Touching assist     Care Tool Bed Mobility Roll left and right activity        Sit to lying activity        Lying to sitting on side of bed activity         Care Tool Transfers Sit to stand transfer        Chair/bed transfer         Toilet transfer   Assist Level: Contact Guard/Touching assist     Care Tool Cognition  Expression of Ideas and Wants Expression of Ideas and Wants: 3. Some difficulty - exhibits some difficulty with expressing needs and ideas (e.g, some words or finishing thoughts) or speech is  not clear  Understanding Verbal and Non-Verbal Content Understanding Verbal and Non-Verbal Content: 3. Usually understands - understands most conversations, but misses some part/intent of message. Requires cues at times to understand   Memory/Recall Ability Memory/Recall Ability : That he or she is in a hospital/hospital unit;Current season   Refer to Care Plan for Long Term Goals  SHORT TERM GOAL WEEK 1 OT Short Term Goal 1 (Week 1): Pt will sequence ADL task with Min cuing. OT Short Term Goal 2 (Week 1): Pt will manage LB dressing with consistent supervision + LRAD. OT Short Term Goal 3 (Week 1): Pt will identify errors made within functional task Mod questioning cues.  Recommendations for other services: Neuropsych   Skilled Therapeutic Intervention Session began with introduction to OT role, OT POC, and general orientation to rehab unit/schedule. Pt  completes full-body sponge-bathing/dressing with levels of assistance noted below. Pt with mild L-sided inattention, unable to detect true L-sided field cut as previously documented on acute care. Pt does become frustrated when unable to easily thread LLE into undergarments, benefiting from education on hemi-dressing techniques. Pt remained sitting in WC with spouse present, posey belt alarmed.   ADL ADL Eating: Supervision/safety;Set up Where Assessed-Eating: Wheelchair Grooming: Supervision/safety Where Assessed-Grooming: Sitting at sink Upper Body Bathing: Supervision/safety;Setup Where Assessed-Upper Body Bathing: Sitting at sink Lower Body Bathing: Supervision/safety;Setup;Contact guard Where Assessed-Lower Body Bathing: Sitting at sink;Standing at sink Upper Body Dressing: Supervision/safety;Setup Where Assessed-Upper Body Dressing: Sitting at sink Lower Body Dressing: Supervision/safety;Setup;Contact guard Where Assessed-Lower Body Dressing: Standing at sink;Sitting at sink Toileting: Contact guard;Supervision/safety Where Assessed-Toileting: Toilet;Bedside Commode Toilet Transfer: Close supervision;Contact guard Toilet Transfer Method: Proofreader: Gaffer: Unable to assess Film/video editor: Unable to assess Mobility  Transfers Sit to Stand: Supervision/Verbal cueing;Contact Guard/Touching assist Stand to Sit: Supervision/Verbal cueing;Contact Guard/Touching assist   Discharge Criteria: Patient will be discharged from OT if patient refuses treatment 3 consecutive times without medical reason, if treatment goals not met, if there is a change in medical status, if patient makes no progress towards goals or if patient is discharged from hospital.  The above assessment, treatment plan, treatment alternatives and goals were discussed and mutually agreed upon: by patient and spouse  Nereida Habermann, OTR/L,  MSOT  09/18/2023, 9:35 AM

## 2023-09-19 ENCOUNTER — Inpatient Hospital Stay (HOSPITAL_COMMUNITY)

## 2023-09-19 DIAGNOSIS — R112 Nausea with vomiting, unspecified: Secondary | ICD-10-CM

## 2023-09-19 DIAGNOSIS — C719 Malignant neoplasm of brain, unspecified: Secondary | ICD-10-CM | POA: Diagnosis not present

## 2023-09-19 DIAGNOSIS — E876 Hypokalemia: Secondary | ICD-10-CM | POA: Diagnosis not present

## 2023-09-19 DIAGNOSIS — D62 Acute posthemorrhagic anemia: Secondary | ICD-10-CM | POA: Diagnosis not present

## 2023-09-19 DIAGNOSIS — K5901 Slow transit constipation: Secondary | ICD-10-CM

## 2023-09-19 DIAGNOSIS — G4489 Other headache syndrome: Secondary | ICD-10-CM

## 2023-09-19 DIAGNOSIS — R739 Hyperglycemia, unspecified: Secondary | ICD-10-CM | POA: Diagnosis not present

## 2023-09-19 LAB — GLUCOSE, CAPILLARY
Glucose-Capillary: 130 mg/dL — ABNORMAL HIGH (ref 70–99)
Glucose-Capillary: 147 mg/dL — ABNORMAL HIGH (ref 70–99)
Glucose-Capillary: 132 mg/dL — ABNORMAL HIGH (ref 70–99)
Glucose-Capillary: 156 mg/dL — ABNORMAL HIGH (ref 70–99)
Glucose-Capillary: 239 mg/dL — ABNORMAL HIGH (ref 70–99)

## 2023-09-19 MED ORDER — BUTALBITAL-APAP-CAFFEINE 50-325-40 MG PO TABS
1.0000 | ORAL_TABLET | ORAL | Status: DC | PRN
  Administered 2023-09-19 – 2023-09-24 (×10): 1 via ORAL
  Filled 2023-09-19 (×10): qty 1

## 2023-09-19 MED ORDER — TOPIRAMATE 25 MG PO TABS
25.0000 mg | ORAL_TABLET | Freq: Every day | ORAL | Status: DC
  Administered 2023-09-19 – 2023-09-24 (×6): 25 mg via ORAL
  Filled 2023-09-19 (×6): qty 1

## 2023-09-19 MED ORDER — DEXAMETHASONE 0.5 MG PO TABS
2.0000 mg | ORAL_TABLET | Freq: Three times a day (TID) | ORAL | Status: AC
  Administered 2023-09-19 – 2023-09-21 (×6): 2 mg via ORAL
  Filled 2023-09-19 (×6): qty 4

## 2023-09-19 NOTE — Progress Notes (Signed)
 PROGRESS NOTE   Subjective/Complaints:  Pt doing poorly today. Per nursing, overnight had increase in headaches, had one episode of emesis. Zofran  seems to help nausea, but nauseated again this morning (no emesis though). Pt says I'm a little confused today-- wife agrees, he's a bit off compared to yesterday.  Pt says pain is manageable, but seems that his headaches are pretty severe and that although the norco helps, it doesn't stop it entirely. Definitely worse than he was having before-- yesterday he only reported R ear/jaw pain and it was managed with tylenol .   LBM 2 days ago but feels ok. Urinating fine. No other complaints or concerns but much more subdued today than yesterday.   ROS: as per HPI. Denies CP, SOB, abd pain, N/V/D, or any other complaints at this time.    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 EXAM: CT HEAD WITHOUT CONTRAST TECHNIQUE: Contiguous axial images were obtained from the base of the skull through the vertex without intravenous contrast. RADIATION DOSE REDUCTION: This exam was performed according to the departmental dose-optimization program which includes automated exposure control, adjustment of the mA and/or kV according to patient size and/or use of iterative reconstruction technique. COMPARISON:  Postoperative brain MRI 09/15/2023. FINDINGS: Brain: Small volume postoperative pneumocephalus. Right temporal lobe resection cavity with heterogeneous mixed density fluid and small volume postoperative gas. Small volume intraventricular hemorrhage not apparent now. Small volume postoperative subdural collection and/or dural repair. Stable ventricle size and configuration. No significant midline shift. Basilar cisterns are patent. Gray-white differentiation  elsewhere within normal limits. Vascular: Calcified atherosclerosis at the skull base. No suspicious intracranial vascular hyperdensity. Skull: Posterior right craniotomy. Sinuses/Orbits: Partial opacified right tympanic cavity and mastoid status post craniotomy closely abutting the right mastoid air cells. Other paranasal sinuses and mastoids are well aerated. Other: Postoperative changes to the right scalp. Skin staples in place. Negative orbits soft tissues. IMPRESSION: 1. Recent resection of Right temporal lobe mass, stable from postoperative MRI. 2. No new intracranial abnormality identified. Electronically Signed   By: VEAR Hurst M.D.   On: 09/19/2023 10:56   Recent Labs    09/17/23 0402 09/17/23 1406  WBC 7.3 5.9  HGB 13.5 12.9*  HCT 40.8 38.3*  PLT 167 174   Recent Labs    09/17/23 0402 09/17/23 1406  NA 138  --   K 2.9*  --   CL 101  --   CO2 29  --   GLUCOSE 103*  --   BUN 13  --   CREATININE 0.74 0.75  CALCIUM 8.6*  --         Intake/Output Summary (Last 24 hours) at 09/19/2023 1138 Last data filed at 09/18/2023 1811 Gross per 24 hour  Intake 716 ml  Output --  Net 716 ml        Physical Exam: Vital Signs Blood pressure (!) 155/81, pulse 76, temperature 98 F (36.7 C), temperature source Oral, resp. rate 18, height 5' 9 (1.753 m), weight 99.3 kg, SpO2 94%.   Gen: no distress,  appears fatigued or subdued, sitting up at EOB but doesn't look like he feels well, much quieter.  HEENT: oral mucosa pink and moist, a little HOH, R Craniotomy site dressed Cardio: Reg rate and rhythm, no m/r/g Chest: normal effort, normal rate of breathing, CTAB Abd: soft, non-distended, nonTTP, +BS throughout though a little hypoactive today Ext: no edema Psych: subdued, quiet Skin: intact except R crani incision, covered.  Neuro: more subdued than yesterday, follows commands  PRIOR EXAMS: Neurological:     Comments: Patient is alert sitting up in chair.  Makes eye contact with  examiner.  Follows simple commands.  He provides name age and date but had some difficulty with hospital course. LUE 4/5, LLE 4/5, impaired FTN  Assessment/Plan: 1. Functional deficits which require 3+ hours per day of interdisciplinary therapy in a comprehensive inpatient rehab setting. Physiatrist is providing close team supervision and 24 hour management of active medical problems listed below. Physiatrist and rehab team continue to assess barriers to discharge/monitor patient progress toward functional and medical goals  Care Tool:  Bathing    Body parts bathed by patient: Right arm, Left arm, Chest, Abdomen, Front perineal area, Buttocks, Right upper leg, Left upper leg, Right lower leg, Left lower leg, Face         Bathing assist Assist Level: Contact Guard/Touching assist     Upper Body Dressing/Undressing Upper body dressing   What is the patient wearing?: Pull over shirt    Upper body assist Assist Level: Supervision/Verbal cueing    Lower Body Dressing/Undressing Lower body dressing      What is the patient wearing?: Underwear/pull up, Pants     Lower body assist Assist for lower body dressing: Contact Guard/Touching assist     Toileting Toileting    Toileting assist Assist for toileting: Contact Guard/Touching assist     Transfers Chair/bed transfer  Transfers assist     Chair/bed transfer assist level: Contact Guard/Touching assist     Locomotion Ambulation   Ambulation assist      Assist level: Contact Guard/Touching assist Assistive device: Walker-rolling Max distance: 250 ft   Walk 10 feet activity   Assist     Assist level: Contact Guard/Touching assist Assistive device: No Device   Walk 50 feet activity   Assist    Assist level: Contact Guard/Touching assist Assistive device: Walker-rolling    Walk 150 feet activity   Assist    Assist level: Contact Guard/Touching assist Assistive device: Walker-rolling    Walk  10 feet on uneven surface  activity   Assist Walk 10 feet on uneven surfaces activity did not occur: Safety/medical concerns         Wheelchair     Assist Is the patient using a wheelchair?: Yes (w/c brought during ambulation in eval but not used; may require during future therapy sessions for fatigue.) Type of Wheelchair: Manual Wheelchair activity did not occur: N/A         Wheelchair 50 feet with 2 turns activity    Assist    Wheelchair 50 feet with 2 turns activity did not occur: N/A       Wheelchair 150 feet activity     Assist  Wheelchair 150 feet activity did not occur: N/A       Blood pressure (!) 155/81, pulse 76, temperature 98 F (36.7 C), temperature source Oral, resp. rate 18, height 5' 9 (1.753 m), weight 99.3 kg, SpO2 94%.  Medical Problem List and Plan: 1. Functional deficits secondary to  right temporal tumor.  Status post right temporal occipital craniotomy for resection of tumor.  Path report high-grade glioma.  Follow-up neuro-oncology outpatient.  Decadron  taper completed             -patient may shower             -ELOS/Goals: 10-14 days             -Continue CIR -09/19/23 pt with increased HA and n/v overnight, confusion/change in activity today -reached out to [facility] Gerard Beck NP who recommended head CT without contrast-- negative -no MRI for now, per Dr. Mavis -restart decadron  2mg  TID x2 days, ordered -zofran  PRN already ordered -started Topamax  25mg  daily, for now dosed at dinner time to avoid fatigue during the day, but timing could be adjusted as primary team sees fit -started Fioricet  1 tab q4h PRN (just need to keep an eye on tylenol  total dose-- advised nursing of this) -monitor closely for changes, appreciate NSG input   2.  Antithrombotics: -DVT/anticoagulation:  Pharmaceutical: Heparin  5000U q8h initiated 09/16/2023             -antiplatelet therapy: Aspirin  on hold due to craniotomy   3. Pain Management: continue  tylenol  and Hydrocodone  as needed-- decreased tylenol  PRN to avoid overdosing since hydrocodone  also has 325mg .  -09/19/23 increased headaches as above, started topamax  and fioricet  as above-- MONITOR TOTAL TYLENOL  INTAKE CLOSELY   4. Mood/Behavior/Sleep: Provide emotional support             -antipsychotic agents: N/A  -09/18/23 poor sleep but doesn't want meds; monitor for now  5. Neuropsych/cognition: This patient is not capable of making decisions on his own behalf.   6. Skin/Wound Care: Routine skin checks   7. Fluids/Electrolytes/Nutrition: Routine in and outs with follow-up chemistries -Hypokalemia: K 2.9 on admission, given KCL 40mEq x3, recheck Monday.    8.  Seizure prophylaxis.  Continue Keppra  500 mg twice daily   9.  Hypertension. continue Avapro  150 mg daily, Norvasc  10 mg nightly, Toprol -XL 50 mg daily.  Monitor with increased mobility  -09/18/23 BPs fine, monitor  -09/19/23 BPs a little up today but could be from pain-- monitor closely.   Vitals:   09/17/23 1344 09/17/23 2021 09/18/23 0440 09/18/23 1502  BP: 127/69 129/80 134/79 121/67   09/18/23 2009 09/19/23 0055 09/19/23 0437 09/19/23 0957  BP: 126/66 (!) 161/76 (!) 140/74 (!) 155/81    10.  Diabetes mellitus.  Patient with insulin  pump PTA as well as Glucophage  1000 mg twice daily and semaglutide .  Currently on NovoLog  5 units 3 times daily, Semglee  40 units twice daily. And SSI -09/18/23 CBGs great, advised that if he brings his freestyle monitor, we can confirm accuracy and use that for CBG checks. Continue current regimen. -09/19/23 CBGs lower today, didn't eat much yesterday. But restarting decadron  today so expect CBGs to go up; monitor   CBG (last 3)  Recent Labs    09/18/23 2151 09/19/23 0100 09/19/23 0552  GLUCAP 70 130* 147*    11.  Hyperlipidemia.  Zocor  20mg  daily  12.  CKD.  Seen by renal services in the past and has since signed off.  Follow-up chemistries  13.  CAD.  Low-dose aspirin  on hold.  No chest  pain or shortness of breath  14.  GERD.  Protonix  40mg  nightly  15.  History of asthma.  Albuterol  inhaler as needed.  16.  Class I obesity.  BMI 32.33.  Dietary follow-up  17. ABLA: Hgb  12.9 7/4, monitor routine labs  18. Bowel management: -09/19/23 LBM 7/4, exam today with slightly hypoactive BS but abd soft and nontender, having n/v but suspect it's from headaches, have low threshold for abd xray if symptoms persist/worsen; monitor closely, may need bowel meds if no BM by tomorrow, but also not eating well.    I spent >61mins performing patient care related activities, including prolonged face to face time, consultation time, documentation time, review of labs/meds/med management, discussion of plan/results with patient and nursing staff, and overall coordination of care.     LOS: 2 days A FACE TO FACE EVALUATION WAS PERFORMED  [address] 09/19/2023, 11:38 AM

## 2023-09-19 NOTE — Evaluation (Signed)
 Physical Therapy Assessment and Plan  Patient Details  Name: Joel Williams MRN: [id number] Date of Birth: 1950/11/24  PT Diagnosis: Difficulty walking, Dizziness and giddiness, Hemiparesis non-dominant, Impaired cognition, Muscle weakness, and Pain in head Rehab Potential: Excellent ELOS: 10-12 days   Today's Date: 09/18/2023 PT Individual Time: 1100-1210 PT Individual Time Calculation (min): 70 min  Hospital Problem: Principal Problem:   Glioma (HCC)   Past Medical History:  Past Medical History:  Diagnosis Date   Allergic rhinitis    Arthritis    ASTHMA 06/08/2008   Asthma    as a child   CAD (coronary artery disease)    a. s/p Promus DES (3.0 x 16 mm) to mid LAD 05/16/10;  b. Myoview  05/15/10: anteroseptal ischemia with EF 52%;   c. Cath 05/16/10: single vessel CAD with mLAD 90% tx with PCI and EF 50%;  d. 07/2011 Cath:  Patent stent, nonobs dzs, NL EF.   Chronic kidney disease    was seen by Dr. Lonna and released.   Colonic polyp    DIABETES MELLITUS, TYPE II 06/08/2008   GERD (gastroesophageal reflux disease)    Hemorrhoids    HYPERLIPIDEMIA 06/08/2008   HYPERTENSION 06/08/2008   Impotence of organic origin 08/24/2008   Myocardial infarction (HCC) 04/16/2010   Pneumonia    hx of   Renal vein thrombosis (HCC)    Seasonal allergies    Past Surgical History:  Past Surgical History:  Procedure Laterality Date   APPLICATION OF CRANIAL NAVIGATION Right 09/14/2023   Procedure: COMPUTER-ASSISTED NAVIGATION, FOR CRANIAL PROCEDURE;  Surgeon: Lanis Pupa, MD;  Location: [facility] OR;  Service: Neurosurgery;  Laterality: Right;   BACK SURGERY  2011   CATARACT EXTRACTION Bilateral    2024   CORONARY ANGIOPLASTY WITH STENT PLACEMENT     CRANIOTOMY Right 09/14/2023   Procedure: STEREOTACTIC RIGHT TEMPRO-OCCIPITAL CRANIOTOMY FOR RESECTION OF TUMOR;  Surgeon: Lanis Pupa, MD;  Location: [facility] OR;  Service: Neurosurgery;  Laterality: Right;   LEFT HEART CATHETERIZATION WITH  CORONARY ANGIOGRAM N/A 07/29/2011   Procedure: LEFT HEART CATHETERIZATION WITH CORONARY ANGIOGRAM;  Surgeon: Lonni JONETTA Cash, MD;  Location: [facility] CATH LAB;  Service: Cardiovascular;  Laterality: N/A;   LEFT HEART CATHETERIZATION WITH CORONARY ANGIOGRAM N/A 06/14/2013   Procedure: LEFT HEART CATHETERIZATION WITH CORONARY ANGIOGRAM;  Surgeon: Lonni JONETTA Cash, MD;  Location: [facility] CATH LAB;  Service: Cardiovascular;  Laterality: N/A;   RADIOLOGY WITH ANESTHESIA N/A 06/09/2022   Procedure: MRI WITH ANESTHESIA OF LUMBAR SPINE WITHOUT CONTRAST;  Surgeon: Radiologist, Medication, MD;  Location: [facility] OR;  Service: Radiology;  Laterality: N/A;    Assessment & Plan Clinical Impression: Patient is a 73 y.o. right-handed male with history significant for asthma and quit smoking 21 years ago, CAD maintained on low-dose aspirin , CKD stage III, renal vein thrombosis seen by renal services in the past and since signed off, diabetes mellitus with insulin  pump, hypertension, hyperlipidemia.  Per chart review patient lives with spouse.  1 level home 5 steps entry.  Independent up until recently using a rolling walker.  He does have supportive family in the area.  Presented 09/14/2023 after being seen urgently in the outpatient neurosurgery clinic after outpatient MRI obtained for constitutional symptoms including unintentional weight loss generalized fatigue and dizziness that revealed large peripherally enhancing temporal lesion.  Underwent stereotactic right temporal occipital craniotomy for resection of tumor 09/14/2023 per Dr. Lanis.  Surgical pathology high-grade glioma.  And plan outpatient follow-up with neuro/oncology services.  Patient was cleared to begin  subcutaneous heparin  for DVT prophylaxis.  Patient did complete Decadron  taper.  Keppra  added for seizure prophylaxis.  Tolerating a regular consistency diet.  Therapy evaluations completed due to patient's decreased functional mobility was admitted for a  comprehensive rehab program. C/o difficulty hearing on the side of his craniotomy. Patient transferred to CIR on 09/17/2023 .   Patient currently requires CGA/minA with mobility secondary to muscle weakness, decreased cardiorespiratoy endurance, unbalanced muscle activation and decreased coordination, decreased motor planning, decreased awareness, decreased problem solving, decreased safety awareness, and delayed processing, and decreased standing balance and decreased balance strategies.  Prior to hospitalization, patient was modified independent  with mobility and lived with Spouse in a House home.  Home access is 3 STE + thresholdStairs to enter.  Patient will benefit from skilled PT intervention to maximize safe functional mobility, minimize fall risk, and decrease caregiver burden for planned discharge home with 24 hour supervision.  Anticipate patient will benefit from follow up OP at discharge.  PT - End of Session Activity Tolerance: Tolerates 30+ min activity with multiple rests Endurance Deficit: Yes Endurance Deficit Description: mild PT Assessment Rehab Potential (ACUTE/IP ONLY): Excellent PT Barriers to Discharge: Decreased caregiver support;Other (comments) (reduced memory, mild cognitive deficits, mild insight into deficits, safety awareness,) PT Plan PT Intensity: Minimum of 1-2 x/day ,45 to 90 minutes PT Frequency: 5 out of 7 days PT Duration Estimated Length of Stay: 10-12 days PT Treatment/Interventions: Ambulation/gait training;Cognitive remediation/compensation;Discharge planning;DME/adaptive equipment instruction;Functional mobility training;Psychosocial support;Therapeutic Activities;UE/LE Strength taining/ROM;Visual/perceptual remediation/compensation;Pain management;Balance/vestibular training;Community reintegration;Functional electrical stimulation;Neuromuscular re-education;Patient/family education;Skin care/wound management;Stair training;Therapeutic Exercise;UE/LE  Coordination activities PT Recommendation Recommendations for Other Services: Therapeutic Recreation consult Therapeutic Recreation Interventions: Stress management;Outing/community reintergration;Kitchen group Follow Up Recommendations: Outpatient PT;24 hour supervision/assistance Patient destination: Home Equipment Recommended: None recommended by PT   PT Evaluation Precautions/Restrictions Precautions Precautions: Fall Precaution/Restrictions Comments: R craniotomy, R HOH; L-sided weakness Restrictions Weight Bearing Restrictions Per Provider Order: No Pain Pain Assessment Pain Scale: 0-10 Pain Score: 2  Pain Type: Surgical pain;Acute pain Pain Location: Head Pain Orientation: Right Pain Descriptors / Indicators: Throbbing Pain Onset: Gradual Pain Intervention(s): Medication (See eMAR) Pain Interference Pain Interference Pain Effect on Sleep: 2. Occasionally Pain Interference with Therapy Activities: 1. Rarely or not at all Pain Interference with Day-to-Day Activities: 2. Occasionally Home Living/Prior Functioning Home Living Available Help at Discharge: Family;Available 24 hours/day Type of Home: House Home Access: Stairs to enter [employer] of Steps: 3 STE + threshold Entrance Stairs-Rails: Right;Left (cannot reach both at same time) Home Layout: One level Bathroom Shower/Tub: Walk-in shower (TTB, x2 grab bars, and detachable shower head) Bathroom Toilet: Handicapped height Bathroom Accessibility: Yes  Lives With: Spouse Prior Function Level of Independence: Independent with basic ADLs;Independent with homemaking with ambulation;Independent with gait;Independent with transfers  Able to Take Stairs?: Reciprically Driving: Yes Vocation: Retired Vision/Perception  Vision - History Ability to See in Adequate Light: 0 Adequate Vision - Assessment Eye Alignment: Within Functional Limits Ocular Range of Motion: Within Functional Limits Alignment/Gaze  Preference: Within Defined Limits Tracking/Visual Pursuits: Able to track stimulus in all quads without difficulty Additional Comments: L eye L field deficit noted in acute care - not seen on PT eval Perception Perception: Within Functional Limits Praxis Praxis: WFL  Cognition Overall Cognitive Status: Impaired/Different from baseline Arousal/Alertness: Awake/alert Orientation Level: Oriented to person;Oriented to place;Oriented to time;Oriented to situation Attention: Selective;Focused Focused Attention: Appears intact Selective Attention: Appears intact Memory: Impaired Memory Impairment: Retrieval deficit;Decreased recall of new information Awareness: Impaired Problem Solving: Impaired Behaviors: Poor  frustration tolerance (mild) Safety/Judgment: Impaired Comments: Fluctuating awareness of deficits. Sensation Sensation Light Touch: Appears Intact Coordination Gross Motor Movements are Fluid and Coordinated: No Fine Motor Movements are Fluid and Coordinated: No Coordination and Movement Description: Deficits due to generalized weakness and decreased balance strategies; Mild dysmetria in LUE/essential tremors in BUE; Mild L-sided inattention Heel Shin Test: WFL; decreased time with LLE Motor  Motor Motor: Other (comment) (mild hemipareisis) Motor - Skilled Clinical Observations: Deficits due to L hemipareisis and decreased balance strategies; Mild dysmetria in LUE/essential tremors in BUE   Trunk/Postural Assessment  Cervical Assessment Cervical Assessment: Within Functional Limits Thoracic Assessment Thoracic Assessment: Exceptions to [facility] Lumbar Assessment Lumbar Assessment: Within Functional Limits (mild rounded shoulders) Postural Control Postural Control: Deficits on evaluation Righting Reactions: Decreased/Delayed Protective Responses: Decreased/Delayed  Balance Balance Balance Assessed: Yes Standardized Balance Assessment Standardized Balance Assessment: Berg  Balance Test;Functional Gait Assessment Berg Balance Test Sit to Stand: Able to stand  independently using hands Standing Unsupported: Able to stand 30 seconds unsupported Sitting with Back Unsupported but Feet Supported on Floor or Stool: Able to sit safely and securely 2 minutes Stand to Sit: Controls descent by using hands Transfers: Able to transfer safely, definite need of hands Standing Unsupported with Eyes Closed: Able to stand 10 seconds with supervision Standing Ubsupported with Feet Together: Needs help to attain position but able to stand for 30 seconds with feet together From Standing, Reach Forward with Outstretched Arm: Can reach forward >12 cm safely (5) From Standing Position, Pick up Object from Floor: Able to pick up shoe, needs supervision From Standing Position, Turn to Look Behind Over each Shoulder: Looks behind from both sides and weight shifts well Turn 360 Degrees: Able to turn 360 degrees safely but slowly Standing Unsupported, Alternately Place Feet on Step/Stool: Able to complete 4 steps without aid or supervision Standing Unsupported, One Foot in Front: Able to take small step independently and hold 30 seconds Standing on One Leg: Able to lift leg independently and hold equal to or more than 3 seconds Total Score: 37 Static Sitting Balance Static Sitting - Balance Support: Feet supported Static Sitting - Level of Assistance: 7: Independent Dynamic Sitting Balance Dynamic Sitting - Balance Support: During functional activity Dynamic Sitting - Level of Assistance: 6: Modified independent (Device/Increase time) Dynamic Sitting - Balance Activities: Lateral lean/weight shifting;Forward lean/weight shifting;Reaching for objects;Reaching across midline Static Standing Balance Static Standing - Balance Support: No upper extremity supported;During functional activity Static Standing - Level of Assistance: 5: Stand by assistance Dynamic Standing Balance Dynamic  Standing - Balance Support: During functional activity;No upper extremity supported Dynamic Standing - Level of Assistance: Other (comment) (CGA) Functional Gait  Assessment Gait assessed : Yes Gait Level Surface: Walks 20 ft in less than 7 sec but greater than 5.5 sec, uses assistive device, slower speed, mild gait deviations, or deviates 6-10 in outside of the 12 in walkway width. Change in Gait Speed: Able to change speed, demonstrates mild gait deviations, deviates 6-10 in outside of the 12 in walkway width, or no gait deviations, unable to achieve a major change in velocity, or uses a change in velocity, or uses an assistive device. Gait with Horizontal Head Turns: Performs head turns smoothly with slight change in gait velocity (eg, minor disruption to smooth gait path), deviates 6-10 in outside 12 in walkway width, or uses an assistive device. Gait with Vertical Head Turns: Performs task with slight change in gait velocity (eg, minor disruption to smooth gait path), deviates  6 - 10 in outside 12 in walkway width or uses assistive device Gait and Pivot Turn: Turns slowly, requires verbal cueing, or requires several small steps to catch balance following turn and stop Step Over Obstacle: Is able to step over one shoe box (4.5 in total height) but must slow down and adjust steps to clear box safely. May require verbal cueing. Gait with Narrow Base of Support: Ambulates 4-7 steps. Gait with Eyes Closed: Walks 20 ft, uses assistive device, slower speed, mild gait deviations, deviates 6-10 in outside 12 in walkway width. Ambulates 20 ft in less than 9 sec but greater than 7 sec. Ambulating Backwards: Walks 20 ft, uses assistive device, slower speed, mild gait deviations, deviates 6-10 in outside 12 in walkway width. Steps: Alternating feet, must use rail. Total Score: 17 Extremity Assessment      RLE Assessment RLE Assessment: Within Functional Limits LLE Assessment LLE Assessment: Within  Functional Limits General Strength Comments: MMT = WFL; functionally, pt demos mild LLE weakness compared to RLE  Care Tool Care Tool Bed Mobility Roll left and right activity   Roll left and right assist level: Supervision/Verbal cueing    Sit to lying activity   Sit to lying assist level: Contact Guard/Touching assist    Lying to sitting on side of bed activity   Lying to sitting on side of bed assist level: the ability to move from lying on the back to sitting on the side of the bed with no back support.: Minimal Assistance - Patient > 75%     Care Tool Transfers Sit to stand transfer   Sit to stand assist level: Contact Guard/Touching assist    Chair/bed transfer   Chair/bed transfer assist level: Contact Guard/Touching assist    Car transfer   Car transfer assist level: Contact Guard/Touching assist      Care Tool Locomotion Ambulation   Assist level: Contact Guard/Touching assist Assistive device: Walker-rolling Max distance: 250 ft  Walk 10 feet activity   Assist level: Contact Guard/Touching assist Assistive device: No Device   Walk 50 feet with 2 turns activity   Assist level: Contact Guard/Touching assist Assistive device: Walker-rolling  Walk 150 feet activity   Assist level: Contact Guard/Touching assist Assistive device: Walker-rolling  Walk 10 feet on uneven surfaces activity Walk 10 feet on uneven surfaces activity did not occur: Safety/medical concerns      Stairs   Assist level: Contact Guard/Touching assist Stairs assistive device: 2 hand rails Max number of stairs: 4  Walk up/down 1 step activity   Walk up/down 1 step (curb) assist level: Contact Guard/Touching assist Walk up/down 1 step or curb assistive device: 2 hand rails  Walk up/down 4 steps activity   Walk up/down 4 steps assist level: Contact Guard/Touching assist Walk up/down 4 steps assistive device: 2 hand rails  Walk up/down 12 steps activity Walk up/down 12 steps activity did not  occur: Safety/medical concerns      Pick up small objects from floor   Pick up small object from the floor assist level: Contact Guard/Touching assist;Minimal Assistance - Patient > 75%    Wheelchair Is the patient using a wheelchair?: Yes (w/c brought during ambulation in eval but not used; may require during future therapy sessions for fatigue.) Type of Wheelchair: Manual Wheelchair activity did not occur: N/A      Wheel 50 feet with 2 turns activity Wheelchair 50 feet with 2 turns activity did not occur: N/A    Wheel 150 feet activity  Wheelchair 150 feet activity did not occur: N/A      Refer to Care Plan for Long Term Goals  SHORT TERM GOAL WEEK 1    Recommendations for other services: Therapeutic Recreation  Pet therapy, Kitchen group, Stress management, and Outing/community reintegration  Skilled Therapeutic Intervention Mobility Bed Mobility Bed Mobility: Supine to Sit;Sit to Supine Supine to Sit: Minimal Assistance - Patient > 75% Sit to Supine: Minimal Assistance - Patient > 75%;Contact Guard/Touching assist Transfers Transfers: Sit to Stand;Stand to Sit;Stand Pivot Transfers Sit to Stand: Supervision/Verbal cueing;Contact Guard/Touching assist Stand to Sit: Supervision/Verbal cueing Stand Pivot Transfers: Supervision/Verbal cueing;Contact Guard/Touching assist Stand Pivot Transfer Details: Verbal cues for precautions/safety;Verbal cues for safe use of DME/AE Transfer (Assistive device): Rolling walker Locomotion  Gait Ambulation: Yes Gait Assistance: Contact Guard/Touching assist;Supervision/Verbal cueing Gait Distance (Feet): 250 Feet Assistive device: Rolling walker Gait Gait: Yes Gait Pattern: Step-through pattern;Decreased stance time - left;Decreased hip/knee flexion - left Gait velocity: reduced High Level Ambulation High Level Ambulation: Backwards walking Backwards Walking: hesitant with smaller steps, reduced confidence and balance, so pt requests  RW use Stairs / Additional Locomotion Stairs: Yes Stairs Assistance: Contact Guard/Touching assist Stair Management Technique: Alternating pattern;Step to pattern;Two rails;Forwards Number of Stairs: 8 Height of Stairs: 6 Wheelchair Mobility Wheelchair Mobility: No  Skilled Intervention: PT Evaluation completed; see above for results. PT educated patient in roles of PT vs OT, PT POC, rehab potential, rehab goals, and discharge recommendations along with recommendation for follow-up rehabilitation services. Individual treatment initiated:  Patient seated upright in w/c upon PT arrival. Patient alert and agreeable to PT session.   No pain complaint during session.  Therapeutic Activity: Bed Mobility: Patient performed supine <> sit requiring light MinA for supine> sit, then CGA for return to supine. Minimal vc for technique/ effort. Transfers: Patient performed sit <> stand and stand pivot  transfers throughout session with CGA initially and improves with confidence to close supervision. Pt is very concerned with balance and voices desire to use RW throughout all standing/ upright mobility.   Gait Training:  Patient ambulated several bouts up to 250 ft using RW with CGA initially and improving to close supervision. Minimal noted missteps and path deviations that pt self-corrects. Requires encouragement to ambulate short distances without RW. Again, pt relates requiring RW for security blanket and increased confidence.   Stair navigation performed with use of both HRs and CGA/ supervision.   Neuromuscular Re-ed: NMR facilitated during session with focus on standing balance. Pt guided in static stance without AD against moderate perturbationsIs able to maintain balance with minimal sway and pt relates feeling increased use of toes and ankles. Pt also maintains balance in challenge to reactive balance.  NMR performed for improvements in motor control and coordination, balance, sequencing,  judgement, and self confidence/ efficacy in performing all aspects of mobility at highest level of independence.   Patient seated upright in w/c at end of session with brakes locked, belt alarm set, and all needs within reach.   Discharge Criteria: Patient will be discharged from PT if patient refuses treatment 3 consecutive times without medical reason, if treatment goals not met, if there is a change in medical status, if patient makes no progress towards goals or if patient is discharged from hospital.  The above assessment, treatment plan, treatment alternatives and goals were discussed and mutually agreed upon: by patient and by family  Mliss DELENA Milliner 09/19/2023, 7:43 AM

## 2023-09-19 NOTE — Progress Notes (Signed)
 Patient continues to complain of headache intermittently over right eye/side this shift. Incision CDI, no additional swelling noted. PRN Norco given consistently this shift with some temporary relief noted. Lights off and door shut to help reduce stimulation. Patient had one episode of emesis around 0130 after toileting. Emesis appeared to be small amount of phlegm mixed with chocolate pudding from earlier in evening. PRN zofran  given. Patient did report relief after this.

## 2023-09-19 NOTE — Evaluation (Signed)
 Speech Language Pathology Assessment and Plan  Patient Details  Name: Joel Williams MRN: [id number] Date of Birth: March 30, 1950  SLP Diagnosis: Cognitive Impairments  Rehab Potential: Good ELOS: 10-14 days    Today's Date: 09/19/2023 SLP Individual Time: 9174-9084 SLP Individual Time Calculation (min): 50 min and Today's Date: 09/19/2023 SLP Missed Time: 5 Minutes Missed Time Reason:  (evaluation completed)   Hospital Problem: Principal Problem:   Glioma [facility])  Past Medical History:  Past Medical History:  Diagnosis Date   Allergic rhinitis    Arthritis    ASTHMA 06/08/2008   Asthma    as a child   CAD (coronary artery disease)    a. s/p Promus DES (3.0 x 16 mm) to mid LAD 05/16/10;  b. Myoview  05/15/10: anteroseptal ischemia with EF 52%;   c. Cath 05/16/10: single vessel CAD with mLAD 90% tx with PCI and EF 50%;  d. 07/2011 Cath:  Patent stent, nonobs dzs, NL EF.   Chronic kidney disease    was seen by Dr. Lonna and released.   Colonic polyp    DIABETES MELLITUS, TYPE II 06/08/2008   GERD (gastroesophageal reflux disease)    Hemorrhoids    HYPERLIPIDEMIA 06/08/2008   HYPERTENSION 06/08/2008   Impotence of organic origin 08/24/2008   Myocardial infarction (HCC) 04/16/2010   Pneumonia    hx of   Renal vein thrombosis (HCC)    Seasonal allergies    Past Surgical History:  Past Surgical History:  Procedure Laterality Date   APPLICATION OF CRANIAL NAVIGATION Right 09/14/2023   Procedure: COMPUTER-ASSISTED NAVIGATION, FOR CRANIAL PROCEDURE;  Surgeon: Lanis Pupa, MD;  Location: [facility] OR;  Service: Neurosurgery;  Laterality: Right;   BACK SURGERY  2011   CATARACT EXTRACTION Bilateral    2024   CORONARY ANGIOPLASTY WITH STENT PLACEMENT     CRANIOTOMY Right 09/14/2023   Procedure: STEREOTACTIC RIGHT TEMPRO-OCCIPITAL CRANIOTOMY FOR RESECTION OF TUMOR;  Surgeon: Lanis Pupa, MD;  Location: [facility] OR;  Service: Neurosurgery;  Laterality: Right;   LEFT HEART CATHETERIZATION  WITH CORONARY ANGIOGRAM N/A 07/29/2011   Procedure: LEFT HEART CATHETERIZATION WITH CORONARY ANGIOGRAM;  Surgeon: Lonni JONETTA Cash, MD;  Location: [facility] C[facility] LAB;  Service: Cardiovascular;  Laterality: N/A;   LEFT HEART CATHETERIZATION WITH CORONARY ANGIOGRAM N/A 06/14/2013   Procedure: LEFT HEART CATHETERIZATION WITH CORONARY ANGIOGRAM;  Surgeon: Lonni JONETTA Cash, MD;  Location: [facility] CATH LAB;  Service: Cardiovascular;  Laterality: N/A;   RADIOLOGY WITH ANESTHESIA N/A 06/09/2022   Procedure: MRI WITH ANESTHESIA OF LUMBAR SPINE WITHOUT CONTRAST;  Surgeon: Radiologist, Medication, MD;  Location: [facility] OR;  Service: Radiology;  Laterality: N/A;    Assessment / Plan / Recommendation Clinical Impression  73yo male admitted 09/14/23 with unintentional weight loss, generalized fatigue, dizziness. MRI revealed a large (3.5cm) posterior right peripherally enhancing temporal lesion. Decision made to proceed with stereotactic craniotomy for resection, completed 09/14/23. PMH: arthritis, asthma, CAD, CKD, DM2, GERD, HLD, HTN, MI.    Skilled Therapeutic Interventions          Pt seen for cognitive-linguistic assessment. He denied concerns for swallowing. He was seen by acute SLP for cog eval on 09/16/23 where he scored 21/30 on the SLUMS Cog deficits at that time included immediate and delayed recall, attention, digit reversal (working memory), auditory attention/recall, and clock drawing (executive functions). Today, SLP prompted informal cognitive-linguistic assessment with ongoing deficits noted in attention and higher level problem solving. Improvement noted with working memory and delayed recall of unfamiliar address which he recalled correctly  at various time intervals throughout the session.   Pt would benefit from SLP intervention to target high level cognitive tasks including medication management, finances, and organization of tasks for daily life. Pt in agreement with plan. Pt left in bed with alarm set  and call bell in reach. Continue SLP PoC.       SLP Assessment  Patient will need skilled Speech Lanaguage Pathology Services during CIR admission    Recommendations  Patient destination: Home Follow up Recommendations: 24 hour supervision/assistance;Outpatient SLP Equipment Recommended: None recommended by SLP    SLP Frequency 3 to 5 out of 7 days   SLP Duration  SLP Intensity  SLP Treatment/Interventions 10-14 days  Minumum of 1-2 x/day, 30 to 90 minutes  Cueing hierarchy;Functional tasks;Patient/family education;Therapeutic Exercise;Medication managment;Cognitive remediation/compensation    Pain Pain Assessment Pain Scale: 0-10 Pain Score: 2  Pain Type: Surgical pain;Acute pain Pain Location: Head Pain Orientation: Right Pain Descriptors / Indicators: Throbbing Pain Onset: Gradual Pain Intervention(s): Medication (See eMAR)  Prior Functioning Cognitive/Linguistic Baseline: Within functional limits Type of Home: House  Lives With: Spouse Available Help at Discharge: Family;Available 24 hours/day Education: 2 years community college Vocation: Retired  Architectural technologist Overall Cognitive Status: Impaired/Different from baseline Arousal/Alertness: Awake/alert Orientation Level: Oriented to person;Oriented to place;Oriented to time;Oriented to situation;Oriented X4 Year: 2025 Month: July Day of Week: Correct Attention: Selective;Focused Focused Attention: Impaired Focused Attention Impairment: Verbal complex Selective Attention: Impaired Selective Attention Impairment: Verbal complex Memory: Impaired Memory Impairment: Retrieval deficit;Decreased recall of new information Awareness: Impaired Awareness Impairment: Emergent impairment Problem Solving: Impaired Problem Solving Impairment: Functional complex;Functional basic Behaviors: Poor frustration tolerance (mild) Safety/Judgment: Impaired Comments: Fluctuating awareness of deficits.   Comprehension Auditory Comprehension Overall Auditory Comprehension: Appears within functional limits for tasks assessed Yes/No Questions: Within Functional Limits Commands: Within Functional Limits Conversation: Complex Expression Expression Primary Mode of Expression: Verbal Verbal Expression Overall Verbal Expression: Appears within functional limits for tasks assessed Initiation: No impairment Automatic Speech: Name;Month of year;Day of week;Social Response Level of Generative/Spontaneous Verbalization: Oncologist: No impairment Pragmatics: No impairment Oral Motor Oral Motor/Sensory Function Overall Oral Motor/Sensory Function: Within functional limits Motor Speech Overall Motor Speech: Appears within functional limits for tasks assessed Intelligibility: Intelligible Motor Planning: Within functional limits Motor Speech Errors: Not applicable  Care Tool Care Tool Cognition Ability to hear (with hearing aid or hearing appliances if normally used Ability to hear (with hearing aid or hearing appliances if normally used): 0. Adequate - no difficulty in normal conservation, social interaction, listening to TV   Expression of Ideas and Wants Expression of Ideas and Wants: 4. Without difficulty (complex and basic) - expresses complex messages without difficulty and with speech that is clear and easy to understand   Understanding Verbal and Non-Verbal Content Understanding Verbal and Non-Verbal Content: 4. Understands (complex and basic) - clear comprehension without cues or repetitions  Memory/Recall Ability Memory/Recall Ability : That he or she is in a hospital/hospital unit;Current season    Short Term Goals: Week 1: SLP Short Term Goal 1 (Week 1): Pt will complete alternating attention tasks with >75% accuracy given min cues for at least 5 minutes. SLP Short Term Goal 2 (Week 1): Pt will complete functional problem solving tasks with >80% accuracy given min  cues.  Refer to Care Plan for Long Term Goals  Recommendations for other services: None   Discharge Criteria: Patient will be discharged from SLP if patient refuses treatment 3 consecutive times without medical reason, if treatment goals  not met, if there is a change in medical status, if patient makes no progress towards goals or if patient is discharged from hospital.  The above assessment, treatment plan, treatment alternatives and goals were discussed and mutually agreed upon: by patient  Peyton JINNY Rummer 09/19/2023, 9:27 AM

## 2023-09-19 NOTE — Discharge Summary (Signed)
 Physician Discharge Summary  Patient ID: Joel Williams MRN: [id number] DOB/AGE: 05/28/1950 73 y.o.  Admit date: 09/17/2023 Discharge date: 09/25/2023  Discharge Diagnoses:  Principal Problem:   Glioma (HCC) DVT prophylaxis Hypertension Diabetes mellitus Hyperlipidemia CKD CAD GERD History of asthma Class I obesity  Discharged Condition: Stable  Significant Diagnostic Studies: CT HEAD WO CONTRAST ( ) Result Date: 09/19/2023 CLINICAL DATA:  73 year old male with headache, status post resection of cystic and solid right temporal lobe mass postoperative day 5. EXAM: CT HEAD WITHOUT CONTRAST TECHNIQUE: Contiguous axial images were obtained from the base of the skull through the vertex without intravenous contrast. RADIATION DOSE REDUCTION: This exam was performed according to the departmental dose-optimization program which includes automated exposure control, adjustment of the mA and/or kV according to patient size and/or use of iterative reconstruction technique. COMPARISON:  Postoperative brain MRI 09/15/2023. FINDINGS: Brain: Small volume postoperative pneumocephalus. Right temporal lobe resection cavity with heterogeneous mixed density fluid and small volume postoperative gas. Small volume intraventricular hemorrhage not apparent now. Small volume postoperative subdural collection and/or dural repair. Stable ventricle size and configuration. No significant midline shift. Basilar cisterns are patent. Gray-white differentiation elsewhere within normal limits. Vascular: Calcified atherosclerosis at the skull base. No suspicious intracranial vascular hyperdensity. Skull: Posterior right craniotomy. Sinuses/Orbits: Partial opacified right tympanic cavity and mastoid status post craniotomy closely abutting the right mastoid air cells. Other paranasal sinuses and mastoids are well aerated. Other: Postoperative changes to the right scalp. Skin staples in place. Negative orbits soft tissues.  IMPRESSION: 1. Recent resection of Right temporal lobe mass, stable from postoperative MRI. 2. No new intracranial abnormality identified. Electronically Signed   By: VEAR Hurst M.D.   On: 09/19/2023 10:56   MR BRAIN W WO CONTRAST Result Date: 09/15/2023 CLINICAL DATA:  73 year old male with a dominant cystic and solid enhancing mass of the right hemisphere occupying the posterior right temporal lobe, associated regional petechial and abnormal right temporal horn ependymal enhancement. Postoperative day 1 status post stereotactic craniotomy and resection of tumor. Preoperative EXAM: MRI HEAD WITHOUT AND WITH CONTRAST TECHNIQUE: Multiplanar, multiecho pulse sequences of the brain and surrounding structures were obtained without and with intravenous contrast. CONTRAST:  10mL GADAVIST  GADOBUTROL  1 MMOL/ML IV SOLN COMPARISON:  MRI 09/02/2023, 08/30/2023. FINDINGS: Brain: Right side craniotomy now and cystic resection cavity of the posterior right temporal lobe at the epicenter of previously demonstrated combined cystic and solid tumor (series 10, image 10). Small volume right hemisphere pneumocephalus. Small volume extra-axial fluid collection in the right hemisphere and underlying the craniotomy flap. Patchy diffusion restriction along the margins of the resection, especially posteriorly (series 5, image 72). Small scattered T1 hyperintense blood products also along the margins. Following contrast small enhancing satellite tumor nodule measuring 5 mm redemonstrated posterior to the resection site (series 18, image 7). And there is also a small volume of patchy enhancement along the posterosuperior resection cavity best seen on series 18, image 7. The punctate and petechial mesial right temporal lobe enhancement near the temporal horn is less apparent now (series 16, image 23) and the right temporal horn ependymal enhancement seems resolved. As before there is little regional T2 and FLAIR hyperintensity beyond the tumor  epicenter. And as on the preoperative scan there is no midline shift or significant intracranial mass effect. Trace intraventricular blood layering in the atria. No ventriculomegaly. No superimposed restricted diffusion suggestive of acute infarction. No new or distant enhancement is identified. Cervicomedullary junction and pituitary are within normal limits. Basilar cisterns are  within normal limits. Vascular: Major intracranial vascular flow voids are stable. Following contrast the major dural venous sinuses are enhancing and appear to be patent. Skull and upper cervical spine: Right side craniotomy. Background bone marrow signal within normal limits. Negative visible cervical spine and spinal cord. Sinuses/Orbits: Stable and negative. Other: Right mastoid effusion is new and appears to be postoperative in nature. Grossly negative other visible internal auditory structures. IMPRESSION: 1. Resection of heterogeneous 5.6 cm tumor occupying the posterior right temporal lobe. Patchy ischemia, mild residual enhancement most pronounced along the posterior resection cavity. And unchanged small 5 mm enhancing tumor nodule posterior to the tumor site. Right temporal horn ependymal and mesial temporal lobe petechial enhancement not apparent now. 2. Other expected postoperative findings in the right hemisphere. Electronically Signed   By: VEAR Hurst M.D.   On: 09/15/2023 04:56   CT ABDOMEN PELVIS W CONTRAST Result Date: 09/03/2023 CLINICAL DATA:  Brain mass.  * Tracking Code: BO * EXAM: CT CHEST, ABDOMEN, AND PELVIS WITH CONTRAST TECHNIQUE: Multidetector CT imaging of the chest was performed noncontrast. Multidetector CT imaging of the abdomen and pelvis was performed following the standard protocol during bolus administration of intravenous contrast. RADIATION DOSE REDUCTION: This exam was performed according to the departmental dose-optimization program which includes automated exposure control, adjustment of the mA  and/or kV according to patient size and/or use of iterative reconstruction technique. CONTRAST:  100mL ISOVUE -300 IOPAMIDOL  (ISOVUE -300) INJECTION 61% COMPARISON:  Lung cancer screening chest CT 08/20/2017. Chest x-ray 08/22/2023. No prior abdomen and pelvis CT available at this time FINDINGS: CT CHEST FINDINGS Cardiovascular: Heart is nonenlarged. Trace pericardial fluid. Significant coronary artery calcifications are identified. The thoracic aorta also has a normal course and caliber with some scattered mild atherosclerotic calcified plaque on this noncontrast examination. Mediastinum/Nodes: Slightly patulous thoracic esophagus. Heterogeneous thyroid  gland. The right thyroid  lobe is enlarged compared to left and there is dystrophic calcification. The appearance is unchanged from previous examination of 2019. On this non IV contrast exam, there is no specific abnormal lymph node enlargement identified in the axillary regions, hilum or mediastinum. Lungs/Pleura: There is some linear opacity lung bases likely scar or atelectasis. No consolidation, pneumothorax or effusion. Upper lung zone mild centrilobular emphysematous lung changes are identified. Few scattered punctate lung nodules identified, the vast majority which are calcified consistent with old granulomatous disease. No other dominant lung nodule identified at this time. Musculoskeletal: Mild degenerative changes. CT ABDOMEN PELVIS FINDINGS Hepatobiliary: Slightly nodular contour to liver consistent with some chronic liver disease. Small enhancing area identified in segment 4 on series 2, image 23 of 13 mm. This could be a vascular lesion based on appearance such as a small hemangioma. If needed confirmatory study could be performed with MRI. No other space-occupying liver lesion identified at this time. Patent portal vein. Gallbladder is nondilated. Pancreas: Mild global atrophy of the pancreas. Spleen: Upper limits of normal for splenic length of 12.5 cm.  Preserved enhancement. Adrenals/Urinary Tract: Adrenal glands are grossly preserved. Nonspecific perinephric stranding. There is some focal atrophy along the lower pole left kidney laterally. Punctate bilateral nonobstructing renal stones are identified, 1 along each kidney. The ureters have a normal course and caliber down to the bladder. Bladder is underdistended. Stomach/Bowel: Large bowel has a normal course and caliber with scattered colonic stool. Normal appendix. Small bowel is nondilated. Oral contrast was administered. Vascular/Lymphatic: Aortic atherosclerosis. No enlarged abdominal or pelvic lymph nodes. Reproductive: Prostate is unremarkable. Other: Small fat containing left inguinal hernia. No  free air or free fluid. Musculoskeletal: Scattered degenerative changes of the spine and pelvis. There is some disc height loss at L5-S1. Scattered endplate osteophytes and disc bulging. IMPRESSION: Evidence of chronic liver disease with a nodular fatty liver. No splenomegaly, ascites. Patent portal vein. Small enhancing focus in the liver in segment 4. Based on appearance this could be a small hemangioma or other vascular lesion. Please correlate for any prior and confirmatory MRI could be considered as clinically appropriate. No bowel obstruction.  Scattered stool. Bilateral nonobstructing renal stones. Scattered few punctate calcified lung nodules consistent with old granulomatous disease. Stable enlargement and heterogeneity of the right thyroid  lobe. No developing abnormal lymph node enlargement or other mass lesion at this time. Electronically Signed   By: Ranell Bring M.D.   On: 09/03/2023 13:48   CT CHEST WO CONTRAST Result Date: 09/03/2023 CLINICAL DATA:  Brain mass.  * Tracking Code: BO * EXAM: CT CHEST, ABDOMEN, AND PELVIS WITH CONTRAST TECHNIQUE: Multidetector CT imaging of the chest was performed noncontrast. Multidetector CT imaging of the abdomen and pelvis was performed following the standard  protocol during bolus administration of intravenous contrast. RADIATION DOSE REDUCTION: This exam was performed according to the departmental dose-optimization program which includes automated exposure control, adjustment of the mA and/or kV according to patient size and/or use of iterative reconstruction technique. CONTRAST:  100mL ISOVUE -300 IOPAMIDOL  (ISOVUE -300) INJECTION 61% COMPARISON:  Lung cancer screening chest CT 08/20/2017. Chest x-ray 08/22/2023. No prior abdomen and pelvis CT available at this time FINDINGS: CT CHEST FINDINGS Cardiovascular: Heart is nonenlarged. Trace pericardial fluid. Significant coronary artery calcifications are identified. The thoracic aorta also has a normal course and caliber with some scattered mild atherosclerotic calcified plaque on this noncontrast examination. Mediastinum/Nodes: Slightly patulous thoracic esophagus. Heterogeneous thyroid  gland. The right thyroid  lobe is enlarged compared to left and there is dystrophic calcification. The appearance is unchanged from previous examination of 2019. On this non IV contrast exam, there is no specific abnormal lymph node enlargement identified in the axillary regions, hilum or mediastinum. Lungs/Pleura: There is some linear opacity lung bases likely scar or atelectasis. No consolidation, pneumothorax or effusion. Upper lung zone mild centrilobular emphysematous lung changes are identified. Few scattered punctate lung nodules identified, the vast majority which are calcified consistent with old granulomatous disease. No other dominant lung nodule identified at this time. Musculoskeletal: Mild degenerative changes. CT ABDOMEN PELVIS FINDINGS Hepatobiliary: Slightly nodular contour to liver consistent with some chronic liver disease. Small enhancing area identified in segment 4 on series 2, image 23 of 13 mm. This could be a vascular lesion based on appearance such as a small hemangioma. If needed confirmatory study could be  performed with MRI. No other space-occupying liver lesion identified at this time. Patent portal vein. Gallbladder is nondilated. Pancreas: Mild global atrophy of the pancreas. Spleen: Upper limits of normal for splenic length of 12.5 cm. Preserved enhancement. Adrenals/Urinary Tract: Adrenal glands are grossly preserved. Nonspecific perinephric stranding. There is some focal atrophy along the lower pole left kidney laterally. Punctate bilateral nonobstructing renal stones are identified, 1 along each kidney. The ureters have a normal course and caliber down to the bladder. Bladder is underdistended. Stomach/Bowel: Large bowel has a normal course and caliber with scattered colonic stool. Normal appendix. Small bowel is nondilated. Oral contrast was administered. Vascular/Lymphatic: Aortic atherosclerosis. No enlarged abdominal or pelvic lymph nodes. Reproductive: Prostate is unremarkable. Other: Small fat containing left inguinal hernia. No free air or free fluid. Musculoskeletal: Scattered degenerative changes  of the spine and pelvis. There is some disc height loss at L5-S1. Scattered endplate osteophytes and disc bulging. IMPRESSION: Evidence of chronic liver disease with a nodular fatty liver. No splenomegaly, ascites. Patent portal vein. Small enhancing focus in the liver in segment 4. Based on appearance this could be a small hemangioma or other vascular lesion. Please correlate for any prior and confirmatory MRI could be considered as clinically appropriate. No bowel obstruction.  Scattered stool. Bilateral nonobstructing renal stones. Scattered few punctate calcified lung nodules consistent with old granulomatous disease. Stable enlargement and heterogeneity of the right thyroid  lobe. No developing abnormal lymph node enlargement or other mass lesion at this time. Electronically Signed   By: Ranell Bring M.D.   On: 09/03/2023 13:48   MR BRAIN W WO CONTRAST Result Date: 09/02/2023 CLINICAL DATA:  Brain  tumor.  Stealth protocol. EXAM: MRI HEAD WITHOUT AND WITH CONTRAST TECHNIQUE: Multiplanar, multiecho pulse sequences of the brain and surrounding structures were obtained without and with intravenous contrast. CONTRAST:  10mL GADAVIST  GADOBUTROL  1 MMOL/ML IV SOLN COMPARISON:  MRI of the brain dated August 30, 2023. FINDINGS: Brain: An ovoid, circumscribed avidly enhancing mass is again demonstrated at the right temporal occipital junction, primarily within the posterior temporal lobe. The lesion measures approximately 5.6 cm AP diameter, 3.6 cm in transverse diameter and 3.5 cm in craniocaudad length. The lesion abuts the lateral wall of the atria of the right lateral ventricle. There is smooth enhancement of the ependyma within the temporal horn. There are multiple small enhancing nodules within the right mesial temporal lobe on images 65-70. There is also a round satellite nodule present posteromedial to the dominant mass at the temporal occipital junction on image 84 of series 10. A nodular focus of enhancement is also present within the right thalamus on image 89 of series 10. There is increased FLAIR signal within the white matter surrounding the dominant mass. Focal areas of increased FLAIR signal are also noted within the pons. Vascular: Normal flow voids. Skull and upper cervical spine: Normal signal intensity. No bony lesions are evident. Sinuses/Orbits: Status post bilateral lens replacement. Mild mucosal disease within the floor the left maxillary sinus. Other: None. IMPRESSION: 1. In addition to the dominant mass previously noted at the right temporo-occipital junction, there are multiple additional nodular foci of enhancement within the right cerebral hemisphere and there is abnormal enhancement of the ependyma lining the temporal horn of the right lateral ventricle. Electronically Signed   By: Evalene Coho M.D.   On: 09/02/2023 08:26   MR Brain Wo Contrast Result Date: 08/30/2023 CLINICAL DATA:   Headache, neuro deficit. Fatigue, confusion, and dizziness. EXAM: MRI HEAD WITHOUT CONTRAST TECHNIQUE: Multiplanar, multiecho pulse sequences of the brain and surrounding structures were obtained without intravenous contrast. COMPARISON:  None Available. FINDINGS: Brain: There is a 5.5 x 3.5 x 3.4 cm heterogeneous mass involving the posterior aspect of the right temporal lobe extending into the lateral right occipital lobe. Associated vasogenic edema within the right temporal occipital lobes. The mass demonstrates internal cystic region which may reflect central necrosis. There is local mass effect without significant midline shift. No acute infarct. T2/FLAIR hyperintensity in the periventricular and subcortical white matter. Small focus of susceptibility in the lateral right cerebellum which may reflect chronic microhemorrhage or small vascular malformation. The basilar cisterns are patent. No extra-axial fluid collections. Ventricles: Prominence of the lateral ventricles suggestive of underlying parenchymal volume loss. Vascular: Skull base flow voids are visualized. Skull and upper cervical  spine: No focal abnormality. Sinuses/Orbits: Bilateral lens replacement. Mild mucosal thickening in the alveolar recess of the left maxillary sinus. Other: Mastoid air cells are clear. IMPRESSION: 5.5 cm mass in the right temporal occipital lobes with mild surrounding vasogenic edema and local mass effect. No midline shift. Differential includes primary CNS neoplasm and metastatic disease among other etiologies. Recommend contrast enhanced MRI for further evaluation. Mild chronic microvascular ischemic changes and mild parenchymal volume loss. Focal susceptibility in the lateral right cerebellum which may reflect chronic microhemorrhage or small vascular malformation. These results will be called to the ordering clinician or representative by the Radiologist Assistant, and communication documented in the PACS or [employer]. Electronically Signed   By: Donnice Mania M.D.   On: 08/30/2023 17:54    Labs:  Basic Metabolic Panel: Recent Labs  Lab 09/17/23 1406 09/20/23 0500 09/20/23 0623  NA  --  134*  --   K  --  4.5  --   CL  --  96*  --   CO2  --  24  --   GLUCOSE  --  157*  --   BUN  --  8  --   CREATININE 0.75 0.66  --   CALCIUM  --  9.4  --   MG  --   --  1.9    CBC: Recent Labs  Lab 09/17/23 1406 09/20/23 0500  WBC 5.9 4.4  NEUTROABS  --  3.0  HGB 12.9* 13.5  HCT 38.3* 39.6  MCV 83.8 83.0  PLT 174 198    CBG: Recent Labs  Lab 09/23/23 0611 09/23/23 1134 09/23/23 1719 09/23/23 2106 09/23/23 2220  GLUCAP 75 176* 86 150* 144*   Family history.  Father with diabetes and heart disease.  Denies any colon cancer esophageal cancer or rectal cancer  Brief HPI:   MARLOW BERENGUER is a 73 y.o. right-handed male with history significant for asthma quit smoking 21 years ago, CAD maintained on low-dose aspirin  CKD stage III renal vein thrombosis seen by renal services in the past and has since signed off, diabetes mellitus with insulin  pump hypertension and hyperlipidemia.  Per chart review lives with spouse independent prior to admission up until recently using a rolling walker.  Presented 09/14/2023 after being seen urgently in the outpatient neurosurgery clinic after outpatient MRI obtained for constitutional symptoms including unintentional weight loss generalized fatigue and dizziness that revealed a large peripherally enhancing temporal lesion.  Underwent stereotactic right temporal occipital craniotomy for resection of tumor 09/14/2023 per Dr. Lanis.  Surgical pathology high-grade glioma.  Plan outpatient follow-up neurology/oncology services.  Patient was cleared to begin subcutaneous heparin  for DVT prophylaxis.  Decadron  taper as indicated.  Keppra  added for seizure prophylaxis.  Tolerating a regular diet.  Therapy evaluations completed due to patient's decreased functional  mobility was admitted for a comprehensive rehab program.   Hospital Course: MARKEEM NOREEN was admitted to rehab 09/17/2023 for inpatient therapies to consist of PT, ST and OT at least three hours five days a week. Past admission physiatrist, therapy team and rehab RN have worked together to provide customized collaborative inpatient rehab.  Pertaining to patient's right temporal tumor status post right temporal occipital craniotomy resection of tumor pathology high-grade glioma.  Follow-up outpatient neurology oncology outpatient.  Decadron  taper as indicated.  Latest cranial CT scan 09/19/2023 stable from postoperative MRI.  No new intracranial abnormality identified.  He was cleared to begin subcutaneous heparin  for DVT prophylaxis 09/16/2023.  Low-dose aspirin  as  prior to admission remained on hold due to craniotomy.  Seizure prophylaxis with Keppra  as indicated.  Maintained on Topamax  scheduled as well as Fioricet  for headaches.  Blood pressure controlled on Avapro  Norvasc  as well as Toprol  monitored with increased mobility.  History of diabetes mellitus insulin  pump as well as Glucophage  and semaglutide  and patient would follow-up with endocrinology services Dr. Faythe..  His Ozempic  would remain on hold until follow-up with endocrinology services.  Zocor  for hyperlipidemia as indicated.  History of CKD seen by renal service in the past that has since signed off monitoring of chemistries.  Protonix  ongoing for GERD.  History of asthma remote tobacco use albuterol  inhaler as needed oxygen saturations maintained greater than 90%.  Class I obesity BMI 32.33 with dietary follow-up.   Blood pressures were monitored on TID basis and remained controlled and monitored  Diabetes has been monitored with ac/hs CBG checks and SSI was use prn for tighter BS control.    Rehab course: During patient's stay in rehab weekly team conferences were held to monitor patient's progress, set goals and discuss barriers to  discharge. At admission, patient required minimal assist 110 feet rolling walker contact-guard sit to stand  He/She  has had improvement in activity tolerance, balance, postural control as well as ability to compensate for deficits. He/She has had improvement in functional use RUE/LUE  and RLE/LLE as well as improvement in awareness.  Working with energy conservation techniques.  Perform supine to sit with cues for positioning.  Patient able to don socks and shoes with cues for safety and body mechanics.  Perform stand step transfer to wheelchair with contact-guard and cues for positioning.  Perform sit to stand with cues for initiation.  Ambulates 175 feet rolling walker and cues for upright posture.  Perform standing and alternating toe taps on cones with right upper extremity support on rolling walker, with PT providing contact-guard minimal assist.  During OT sessions he ambulates with a rolling walker to the day room with close supervision.  Patient worked on bilateral hand coordination with seated ball bounces on floor and overhead throws.  Supervision upper body bathing supervision lower body bathing supervision upper body dressing as well as lower body dressing.  Close supervision walk-in shower transfer.  SLP follow-up patient shared biographical information including family, job and medical history.  Wife does endorse some short-term memory deficits and occasional hallucinations.  SLP facilitated completion of medication management task to encourage use of problem-solving memory and organizational skills.  SLP provided supervision assist for patient to complete twice daily pillbox according to written and verbalize directions.  Full family teaching completed plan discharge to home.       Disposition:  Discharge disposition: 01-Home or Self Care        Diet: Diabetic diet  Special Instructions: No driving smoking or alcohol  Continue to hold aspirin  until follow-up  neurosurgery  Medications at discharge. 1.  Tylenol  as needed 2.  Norvasc  10 mg p.o. nightly 3.  Fioricet  1 tablet every 4 hours as needed headache 4.  Hydrocodone  5-325 mg 1 tablet every 4 hours as needed pain 5.  Avapro  150 mg p.o. daily 6.  Keppra  500 mg p.o. twice daily 7.  Toprol -XL 50 mg p.o. daily 8.  Multivitamin daily 9.  Protonix  40 mg p.o. nightly 10.  Zocor  20 mg p.o. daily 11.  Topamax  20 mg p.o. daily 12.  Albuterol  inhaler 1 to 2 puffs every 6 hours as needed shortness of breath 13.  Vitamin D 5000 units every morning 14.  Vitamin C  1000 mg daily 15.  Zinc  sulfate 220 mg nightly 16.  Vitamin E  400 units daily 17.  Senokot-S 1 to 2 tablets twice daily as needed 18.  Glucophage  1000 mg twice daily 19.  Resume insulin  pump with home regimen regimen as prior to admission 20.  Claritin  10 mg daily 21.  MiraLAX  daily as needed 30-35 minutes spent completing discharge summary and discharge planning  Discharge Instructions     Ambulatory referral to Occupational Therapy   Complete by: As directed    Evaluate and treat   Ambulatory referral to Physical Medicine Rehab   Complete by: As directed    Moderate complexity follow-up 1 to 2 weeks glioma   Ambulatory referral to Physical Therapy   Complete by: As directed    Evaluate and treat   Ambulatory referral to Speech Therapy   Complete by: As directed    Evaluate and treat        Follow-up Information     Emeline Search C, DO Follow up.   Specialty: Physical Medicine and Rehabilitation Why: Office to call for appointment Contact information: [address] [place] [address] [phone]         Lanis Pupa, MD Follow up.   Specialty: Neurosurgery Why: Call for appointment Contact information: [address] [place] [address] [phone]         Faythe Purchase, MD Follow up.   Specialty: Endocrinology Why: Call For Appointment Contact  information: [address] [place] [address] [phone]         Verlin Lonni BIRCH, MD Follow up.   Specialty: Cardiology Why: Call for appointment Contact information: [address] [place] [address] [phone]                 Signed: Toribio JINNY Pitch 09/24/2023, 5:15 AM

## 2023-09-19 NOTE — Discharge Instructions (Signed)
 Inpatient Rehab Discharge Instructions  Joel Williams Discharge date and time: No discharge date for patient encounter.   Activities/Precautions/ Functional Status: Activity: As tolerated Diet: Diabetic diet Wound Care: Routine skin checks Functional status:  ___ No restrictions     ___ Walk up steps independently ___ 24/7 supervision/assistance   ___ Walk up steps with assistance ___ Intermittent supervision/assistance  ___ Bathe/dress independently ___ Walk with walker     _x__ Bathe/dress with assistance ___ Walk Independently    ___ Shower independently ___ Walk with assistance    ___ Shower with assistance ___ No alcohol     ___ Return to work/school ________  Special Instructions: No driving smoking or alcohol  Follow-up outpatient neuro/oncology after seen by Dr. Lanis.  COMMUNITY REFERRALS UPON DISCHARGE:    Outpatient: PT      OT     ST              Agency: [facility] [place] location    Phone: [phone]              Appointment Date/Time:*Please expect follow-up within 7-10 business days to schedule your appointment. If you have not received follow-up, be sure to contact the site directly.*   Medical Equipment/Items Ordered: 3in1 bedside commode                                                 Agency/Supplier:[facility] [phone]  My questions have been answered and I understand these instructions. I will adhere to these goals and the provided educational materials after my discharge from the hospital.  Patient/Caregiver Signature _______________________________ Date __________  Clinician Signature _______________________________________ Date __________  Please bring this form and your medication list with you to all your follow-up doctor's appointments.

## 2023-09-19 NOTE — Plan of Care (Signed)
  Problem: RH Balance Goal: LTG Patient will maintain dynamic standing balance (PT) Description: LTG:  Patient will maintain dynamic standing balance with assistance during mobility activities (PT) Flowsheets (Taken 09/19/2023 2323) LTG: Pt will maintain dynamic standing balance during mobility activities with:: Independent with assistive device    Problem: Sit to Stand Goal: LTG:  Patient will perform sit to stand with assistance level (PT) Description: LTG:  Patient will perform sit to stand with assistance level (PT) Flowsheets (Taken 09/19/2023 2323) LTG: PT will perform sit to stand in preparation for functional mobility with assistance level: Independent   Problem: RH Bed Mobility Goal: LTG Patient will perform bed mobility with assist (PT) Description: LTG: Patient will perform bed mobility with assistance, with/without cues (PT). Flowsheets (Taken 09/19/2023 2323) LTG: Pt will perform bed mobility with assistance level of: Independent with assistive device    Problem: RH Bed to Chair Transfers Goal: LTG Patient will perform bed/chair transfers w/assist (PT) Description: LTG: Patient will perform bed to chair transfers with assistance (PT). Flowsheets (Taken 09/19/2023 2323) LTG: Pt will perform Bed to Chair Transfers with assistance level: Independent   Problem: RH Car Transfers Goal: LTG Patient will perform car transfers with assist (PT) Description: LTG: Patient will perform car transfers with assistance (PT). Flowsheets (Taken 09/19/2023 2323) LTG: Pt will perform car transfers with assist:: Supervision/Verbal cueing   Problem: RH Ambulation Goal: LTG Patient will ambulate in home environment (PT) Description: LTG: Patient will ambulate in home environment, # of feet with assistance (PT). Flowsheets (Taken 09/19/2023 2323) LTG: Pt will ambulate in home environ  assist needed:: Independent with assistive device LTG: Ambulation distance in home environment: at least 50 ft using  LRAD Goal: LTG Patient will ambulate in community environment (PT) Description: LTG: Patient will ambulate in community environment, # of feet with assistance (PT). Flowsheets (Taken 09/19/2023 2323) LTG: Pt will ambulate in community environ  assist needed:: Supervision/Verbal cueing LTG: Ambulation distance in community environment: more than 400 ft using LRAD   Problem: RH Stairs Goal: LTG Patient will ambulate up and down stairs w/assist (PT) Description: LTG: Patient will ambulate up and down # of stairs with assistance (PT) Flowsheets (Taken 09/19/2023 2323) LTG: Pt will ambulate up/down stairs assist needed:: Independent with assistive device LTG: Pt will  ambulate up and down number of stairs: at least 4 steps using one HR as per home environment

## 2023-09-19 NOTE — Plan of Care (Signed)
  Problem: RH Problem Solving Goal: LTG Patient will demonstrate problem solving for (SLP) Description: LTG:  Patient will demonstrate problem solving for basic/complex daily situations with cues  (SLP) Flowsheets (Taken 09/19/2023 0920) LTG: Patient will demonstrate problem solving for (SLP):  Complex daily situations  Basic daily situations LTG Patient will demonstrate problem solving for: Supervision   Problem: RH Attention Goal: LTG Patient will demonstrate this level of attention during functional activites (SLP) Description: LTG:  Patient will will demonstrate this level of attention during functional activites (SLP) Flowsheets (Taken 09/19/2023 0920) Patient will demonstrate during cognitive/linguistic activities the attention type of:  Sustained  Selective  Alternating Patient will demonstrate this level of attention during cognitive/linguistic activities in: Controlled LTG: Patient will demonstrate this level of attention during cognitive/linguistic activities with assistance of (SLP): Supervision   Problem: RH Awareness Goal: LTG: Patient will demonstrate awareness during functional activites type of (SLP) Description: LTG: Patient will demonstrate awareness during functional activites type of (SLP) Flowsheets (Taken 09/19/2023 0920) Patient will demonstrate during cognitive/linguistic activities awareness type of:  Anticipatory  Emergent LTG: Patient will demonstrate awareness during cognitive/linguistic activities with assistance of (SLP): Supervision

## 2023-09-20 ENCOUNTER — Inpatient Hospital Stay: Attending: Neurosurgery

## 2023-09-20 DIAGNOSIS — C719 Malignant neoplasm of brain, unspecified: Secondary | ICD-10-CM | POA: Diagnosis not present

## 2023-09-20 LAB — COMPREHENSIVE METABOLIC PANEL WITH GFR
ALT: 16 U/L (ref 0–44)
AST: 20 U/L (ref 15–41)
Albumin: 3.1 g/dL — ABNORMAL LOW (ref 3.5–5.0)
Alkaline Phosphatase: 45 U/L (ref 38–126)
Anion gap: 14 (ref 5–15)
BUN: 8 mg/dL (ref 8–23)
CO2: 24 mmol/L (ref 22–32)
Calcium: 9.4 mg/dL (ref 8.9–10.3)
Chloride: 96 mmol/L — ABNORMAL LOW (ref 98–111)
Creatinine, Ser: 0.66 mg/dL (ref 0.61–1.24)
GFR, Estimated: >60 mL/min (ref >60)
Glucose, Bld: 157 mg/dL — ABNORMAL HIGH (ref 70–99)
Potassium: 4.5 mmol/L (ref 3.5–5.1)
Sodium: 134 mmol/L — ABNORMAL LOW (ref 135–145)
Total Bilirubin: 1.1 mg/dL (ref 0.0–1.2)
Total Protein: 6.5 g/dL (ref 6.5–8.1)

## 2023-09-20 LAB — GLUCOSE, CAPILLARY
Glucose-Capillary: 164 mg/dL — ABNORMAL HIGH (ref 70–99)
Glucose-Capillary: 184 mg/dL — ABNORMAL HIGH (ref 70–99)
Glucose-Capillary: 189 mg/dL — ABNORMAL HIGH (ref 70–99)
Glucose-Capillary: 215 mg/dL — ABNORMAL HIGH (ref 70–99)

## 2023-09-20 LAB — CBC WITH DIFFERENTIAL/PLATELET
Abs Immature Granulocytes: 0.01 K/uL (ref 0.00–0.07)
Basophils Absolute: 0 K/uL (ref 0.0–0.1)
Basophils Relative: 0 %
Eosinophils Absolute: 0 K/uL (ref 0.0–0.5)
Eosinophils Relative: 0 %
HCT: 39.6 % (ref 39.0–52.0)
Hemoglobin: 13.5 g/dL (ref 13.0–17.0)
Immature Granulocytes: 0 %
Lymphocytes Relative: 24 %
Lymphs Abs: 1.1 K/uL (ref 0.7–4.0)
MCH: 28.3 pg (ref 26.0–34.0)
MCHC: 34.1 g/dL (ref 30.0–36.0)
MCV: 83 fL (ref 80.0–100.0)
Monocytes Absolute: 0.3 K/uL (ref 0.1–1.0)
Monocytes Relative: 8 %
Neutro Abs: 3 K/uL (ref 1.7–7.7)
Neutrophils Relative %: 68 %
Platelets: 198 K/uL (ref 150–400)
RBC: 4.77 MIL/uL (ref 4.22–5.81)
RDW: 13.6 % (ref 11.5–15.5)
WBC: 4.4 K/uL (ref 4.0–10.5)
nRBC: 0 % (ref 0.0–0.2)

## 2023-09-20 LAB — MAGNESIUM: Magnesium: 1.9 mg/dL (ref 1.7–2.4)

## 2023-09-20 NOTE — Progress Notes (Signed)
 Patient ID: Joel Williams, male   DOB: 05/19/1950, 73 y.o.   MRN: [id number]  1434- SW left message for pt wife [name] to introduce self, explain role, discuss discharge process, and inform on ELOS.   1455- SW received return phone call from pt wife. She confirms she will be primary caregiver  turned two daughters and SIL and four grandchildren (11-21) who will help and proivde PRN support. DME- RW, shower chair, and SIL will be installing grab bars this week, and will need a 3in1 BSC. STE- 2 steps to go to landing and one step into the home.  Wife reports that her husband shared with her that when he would go out driving he would not know where he is at. States his mother has dementia and that was part of her problem as well. Is there any way to have him tested for this as well. She is stating her concern is hat he will be really hard to get back into a hospital. Also expressing concerns that when he would return form his outing, how did things go and he would share that he never actually made it there.  She reports since he surgery he can perseverate on stories from the past but short term memory is  not there. Also reports he is hallucinating and thinking people are in the room that are not there. She is aware SW will relay her concerns to medical team. SW will also follow-up with updates after team conference.    Graeme Jude, MSW, LCSW Office: [phone] Cell: [phone] Fax: [phone]

## 2023-09-20 NOTE — Progress Notes (Signed)
 PROGRESS NOTE   Subjective/Complaints:  No events overnight.  Patient feeling much better this morning, says Fioricet  is significantly improving his headaches.  Has no complaints today.  Nausea has resolved, states he is going to eat much better today.  Patient's daughter and granddaughter at therapy gym; states they have noticed more confusion today, but he is able to identify family members but is mixing up their names.  Labs singificant for mild low NA 134, otherwise stable Vitals stable with some mild hypertension    09/20/2023    3:10 AM 09/19/2023    7:39 PM 09/19/2023    3:22 PM  Vitals with BMI  Systolic 139 155 852  Diastolic 81 74 74  Pulse 73 77 74   BG slightly elevated Recent Labs    09/19/23 1625 09/19/23 2030 09/20/23 0533  GLUCAP 156* 239* 164*     P.o. intakes dropped off precipitously yesterday, 10% of 1 meal.  No documented breakfast this a.m. Continent of bladder  Last BM 7/4  ROS: as per HPI. Denies CP, SOB, abd pain, N/V/D, or any other complaints at this time.    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 EXAM: CT HEAD WITHOUT CONTRAST TECHNIQUE: Contiguous axial images were obtained from the base of the skull through the vertex without intravenous contrast. RADIATION DOSE REDUCTION: This exam was performed according to the departmental dose-optimization program which includes automated exposure control, adjustment of the mA and/or kV according to patient size and/or use of iterative reconstruction technique. COMPARISON:  Postoperative brain MRI 09/15/2023. FINDINGS: Brain: Small volume postoperative pneumocephalus. Right temporal lobe resection cavity with heterogeneous mixed density fluid and small volume postoperative gas. Small volume  intraventricular hemorrhage not apparent now. Small volume postoperative subdural collection and/or dural repair. Stable ventricle size and configuration. No significant midline shift. Basilar cisterns are patent. Gray-white differentiation elsewhere within normal limits. Vascular: Calcified atherosclerosis at the skull base. No suspicious intracranial vascular hyperdensity. Skull: Posterior right craniotomy. Sinuses/Orbits: Partial opacified right tympanic cavity and mastoid status post craniotomy closely abutting the right mastoid air cells. Other paranasal sinuses and mastoids are well aerated. Other: Postoperative changes to the right scalp. Skin staples in place. Negative orbits soft tissues. IMPRESSION: 1. Recent resection of Right temporal lobe mass, stable from postoperative MRI. 2. No new intracranial abnormality identified. Electronically Signed   By: VEAR Hurst M.D.   On: 09/19/2023 10:56   Recent Labs    09/17/23 1406 09/20/23 0500  WBC 5.9 4.4  HGB 12.9* 13.5  HCT 38.3* 39.6  PLT 174 198   Recent Labs    09/17/23 1406 09/20/23 0500  NA  --  134*  K  --  4.5  CL  --  96*  CO2  --  24  GLUCOSE  --  157*  BUN  --  8  CREATININE 0.75 0.66  CALCIUM  --  9.4       No intake or output data in the 24 hours ending 09/20/23 0809       Physical  Exam: Vital Signs Blood pressure 139/81, pulse 73, temperature 97.7 F (36.5 C), temperature source Oral, resp. rate 18, height 5' 9 (1.753 m), weight 99.3 kg, SpO2 94%.   Gen: no distress, appropriate appearance for age, sitting up in therapy gym. HEENT: oral mucosa pink and moist, a little HOH, R Craniotomy site dressed/stable Cardio: Reg rate and rhythm, no m/r/g Chest: normal effort, normal rate of breathing, CTAB Abd: soft, non-distended, nonTTP, +BS throughout, normoactive Ext: No peripheral edema. Psych: Appropriate mood and affect. Skin: intact except R crani incision, covered.  Dried blood overlying; no active  drainage.  Neuro:  Awake, alert.  Can follow all simple commands. Oriented to person, place, and time with cues. Mild delay and some apparent memory deficits. Strength: 4 out of 5 left upper and lower extremity, 5 out of 5 right upper and lower extremity. Sensation: Intact in all 4 extremities Coordination: Impaired left upper extremity finger-to-nose, left lower extremity heel-to-shin. Tone: No spasticity    Assessment/Plan: 1. Functional deficits which require 3+ hours per day of interdisciplinary therapy in a comprehensive inpatient rehab setting. Physiatrist is providing close team supervision and 24 hour management of active medical problems listed below. Physiatrist and rehab team continue to assess barriers to discharge/monitor patient progress toward functional and medical goals  Care Tool:  Bathing    Body parts bathed by patient: Right arm, Left arm, Chest, Abdomen, Front perineal area, Buttocks, Right upper leg, Left upper leg, Right lower leg, Left lower leg, Face         Bathing assist Assist Level: Contact Guard/Touching assist     Upper Body Dressing/Undressing Upper body dressing   What is the patient wearing?: Pull over shirt    Upper body assist Assist Level: Supervision/Verbal cueing    Lower Body Dressing/Undressing Lower body dressing      What is the patient wearing?: Underwear/pull up, Pants     Lower body assist Assist for lower body dressing: Contact Guard/Touching assist     Toileting Toileting    Toileting assist Assist for toileting: Contact Guard/Touching assist     Transfers Chair/bed transfer  Transfers assist     Chair/bed transfer assist level: Contact Guard/Touching assist     Locomotion Ambulation   Ambulation assist      Assist level: Contact Guard/Touching assist Assistive device: Walker-rolling Max distance: 250 ft   Walk 10 feet activity   Assist     Assist level: Contact Guard/Touching  assist Assistive device: No Device   Walk 50 feet activity   Assist    Assist level: Contact Guard/Touching assist Assistive device: Walker-rolling    Walk 150 feet activity   Assist    Assist level: Contact Guard/Touching assist Assistive device: Walker-rolling    Walk 10 feet on uneven surface  activity   Assist Walk 10 feet on uneven surfaces activity did not occur: Safety/medical concerns         Wheelchair     Assist Is the patient using a wheelchair?: Yes (w/c brought during ambulation in eval but not used; may require during future therapy sessions for fatigue.) Type of Wheelchair: Manual Wheelchair activity did not occur: N/A         Wheelchair 50 feet with 2 turns activity    Assist    Wheelchair 50 feet with 2 turns activity did not occur: N/A       Wheelchair 150 feet activity     Assist  Wheelchair 150 feet activity did not occur: N/A  Blood pressure 139/81, pulse 73, temperature 97.7 F (36.5 C), temperature source Oral, resp. rate 18, height 5' 9 (1.753 m), weight 99.3 kg, SpO2 94%.  Medical Problem List and Plan: 1. 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  Status post right temporal occipital craniotomy for resection of tumor.  Path report high-grade glioma.  Follow-up neuro-oncology outpatient.  Decadron  taper completed             -patient may shower             -ELOS/Goals: 10-14 days             -Continue CIR -09/19/23 pt with increased HA and n/v overnight, confusion/change in activity today -reached out to NSG Gerard Beck NP who recommended head CT without contrast-- negative -no MRI for now, per Dr. Mavis -restart decadron  2mg  TID x2 days, ordered -zofran  PRN already ordered -monitor closely for changes, appreciate NSG input 7/7: CT negative--doing much better this a.m. after medication changes.   2.  Antithrombotics: -DVT/anticoagulation:  Pharmaceutical: Heparin  5000U q8h initiated  09/16/2023             -antiplatelet therapy: Aspirin  on hold due to craniotomy   3. Pain Management: continue tylenol  and Hydrocodone  as needed-- decreased tylenol  PRN to avoid overdosing since hydrocodone  also has 325mg .  -09/19/23 increased headaches as above, started topamax  and fioricet  as above-- MONITOR TOTAL TYLENOL  INTAKE CLOSELY started Topamax  25mg  daily, for now dosed at dinner time to avoid fatigue during the day, but timing could be adjusted as primary team sees fit  -started Fioricet  1 tab q4h PRN (just need to keep an eye on tylenol  total dose-- advised nursing of this) 7/7: Using fioricet  consistently w/ norco; symptoms much improved with this and resumption of Decadron ..  Continue current regimen, if staying below Tylenol  limits.  4. Mood/Behavior/Sleep: Provide emotional support             -antipsychotic agents: N/A  -09/18/23 poor sleep but doesn't want meds; monitor for now  7-7: Intermittent confusion concerning for development of delirium; will place precautions, get sleep log.  5. Neuropsych/cognition: This patient is not capable of making decisions on his own behalf.   6. Skin/Wound Care: Routine skin checks   7. Fluids/Electrolytes/Nutrition: Routine in and outs with follow-up chemistries -Hypokalemia: K 2.9 on admission, given KCL 40mEq x3, recheck Monday.     - 7/7: Nutrition consulted for poor POs  8.  Seizure prophylaxis.  Continue Keppra  500 mg twice daily   9.  Hypertension. continue Avapro  150 mg daily, Norvasc  10 mg nightly, Toprol -XL 50 mg daily.  Monitor with increased mobility  -09/18/23 BPs fine, monitor  -09/19/23 BPs a little up today but could be from pain-- monitor closely.   7-7: BP is looking better with headache control.  Vitals:   09/18/23 1502 09/18/23 2009 09/19/23 0055 09/19/23 0437  BP: 121/67 126/66 (!) 161/76 (!) 140/74   09/19/23 0957 09/19/23 1315 09/19/23 1522 09/19/23 1939  BP: (!) 155/81 (!) 151/81 (!) 147/74 (!) 155/74   09/20/23  0310 09/20/23 1257 09/20/23 1900 09/20/23 2220  BP: 139/81 133/73 136/70 130/78    10.  Diabetes mellitus.  Patient with insulin  pump PTA as well as Glucophage  1000 mg twice daily and semaglutide .  Currently on NovoLog  5 units 3 times daily, Semglee  40 units twice daily. And SSI -09/18/23 CBGs great, advised that if he brings his freestyle monitor, we can confirm accuracy and use that for CBG checks. Continue current regimen. -  09/19/23 CBGs lower today, didn't eat much yesterday. But restarting decadron  today so expect CBGs to go up; monitor  7/7: BG increased due to decadron  - monitor today then increase semglee  as indicated  CBG (last 3)  Recent Labs    09/19/23 1625 09/19/23 2030 09/20/23 0533  GLUCAP 156* 239* 164*    11.  Hyperlipidemia.  Zocor  20mg  daily  12.  CKD.  Seen by renal services in the past and has since signed off.  Follow-up chemistries   - stable  13.  CAD.  Low-dose aspirin  on hold.  No chest pain or shortness of breath  14.  GERD.  Protonix  40mg  nightly  15.  History of asthma.  Albuterol  inhaler as needed.  16.  Class I obesity.  BMI 32.33.  Dietary follow-up  17. ABLA: Hgb 12.9 7/4, monitor routine labs  18. Bowel management: -09/19/23 LBM 7/4, exam today with slightly hypoactive BS but abd soft and nontender, having n/v but suspect it's from headaches, have low threshold for abd xray if symptoms persist/worsen; monitor closely, may need bowel meds if no BM by tomorrow, but also not eating well.  7-7: Last BM 7-4, ate poorly yesterday, feeling much better today.  No concerning signs on exam.     LOS: 3 days A FACE TO FACE EVALUATION WAS PERFORMED  Joel Williams 09/20/2023, 8:09 AM

## 2023-09-20 NOTE — IPOC Note (Signed)
 Overall Plan of Care [facility]) Patient Details Name: Joel Williams MRN: [id number] DOB: 03/06/1951  Admitting Diagnosis: [facility])  Hospital Problems: Principal Problem:   [facility])     Functional Problem List: Nursing Bladder, Bowel, Medication Management, Endurance, Safety, Pain  PT Balance, Behavior, Endurance, Safety, Motor  OT Balance, Cognition, Endurance, Motor, Pain, Safety, Skin Integrity, Vision  SLP Cognition, Safety, Perception  TR         Basic ADL's: OT Bathing, Dressing, Toileting     Advanced  ADL's: OT       Transfers: PT Bed Mobility, Bed to Chair, Car, Occupational psychologist, Research scientist (life sciences): PT Ambulation, Stairs     Additional Impairments: OT Fuctional Use of Upper Extremity  SLP None      TR      Anticipated Outcomes Item Anticipated Outcome  Self Feeding    Swallowing      Basic self-care  Supervision  Toileting  Supervision   Bathroom Transfers Supervision  Bowel/Bladder  manage bowel w mod I assist and bladder w toileting  Transfers  Mod I  Locomotion  Mod I/ supervision  Communication     Cognition  Supervision  Pain  Pain < 4 with prns  Safety/Judgment  manage safety w cues   Therapy Plan: PT Intensity: Minimum of 1-2 x/day ,45 to 90 minutes PT Frequency: 5 out of 7 days PT Duration Estimated Length of Stay: 10-12 days OT Intensity: Minimum of 1-2 x/day, 45 to 90 minutes OT Frequency: 5 out of 7 days OT Duration/Estimated Length of Stay: 10-12 days SLP Intensity: Minumum of 1-2 x/day, 30 to 90 minutes SLP Frequency: 3 to 5 out of 7 days SLP Duration/Estimated Length of Stay: 10-14 days   Team Interventions: Nursing Interventions Patient/Family Education, Bladder Management, Bowel Management, Disease Management/Prevention, Pain Management, Medication Management, Discharge Planning, Psychosocial Support  PT interventions Ambulation/gait training, Cognitive remediation/compensation, Discharge  planning, DME/adaptive equipment instruction, Functional mobility training, Psychosocial support, Therapeutic Activities, UE/LE Strength taining/ROM, Visual/perceptual remediation/compensation, Pain management, Metallurgist training, Community reintegration, Functional electrical stimulation, Neuromuscular re-education, Patient/family education, Skin care/wound management, Stair training, Therapeutic Exercise, UE/LE Coordination activities  OT Interventions Warden/ranger, DME/adaptive equipment instruction, Cognitive remediation/compensation, Community reintegration, Discharge planning, Disease mangement/prevention, Functional electrical stimulation, Functional mobility training, Neuromuscular re-education, Pain management, Patient/family education, Psychosocial support, Self Care/advanced ADL retraining, Skin care/wound managment, Therapeutic Activities, Therapeutic Exercise, UE/LE Strength taining/ROM, UE/LE Coordination activities, Visual/perceptual remediation/compensation  SLP Interventions Cueing hierarchy, Functional tasks, Patient/family education, Therapeutic Exercise, Medication managment, Cognitive remediation/compensation  TR Interventions    SW/CM Interventions     Barriers to Discharge MD  Medical stability, Home enviroment access/loayout, Wound care, and Lack of/limited family support  Nursing Decreased caregiver support, Home environment access/layout 1 level 5 ste bil rails w spouse; has RW and shower seat  PT Decreased caregiver support, Other (comments) (reduced memory, mild cognitive deficits, mild insight into deficits, safety awareness,)    OT Wound Care    SLP      SW       Team Discharge Planning: Destination: PT-Home ,OT- Home , SLP-Home Projected Follow-up: PT-Outpatient PT, 24 hour supervision/assistance, OT-  Outpatient OT, SLP-24 hour supervision/assistance, Outpatient SLP Projected Equipment Needs: PT-None recommended by PT, OT- To be  determined, SLP-None recommended by SLP Equipment Details: PT- , OT-  Patient/family involved in discharge planning: PT- Patient, Family member/caregiver,  OT-Patient, Family member/caregiver, SLP-Patient, Patient unable/family or caregive not available  MD ELOS: 10-14 days Medical Rehab Prognosis:  Good Assessment: The patient has been admitted for CIR therapies with the diagnosis of TBI s/p R temporal craniotomy for tumor resection. The team will be addressing functional mobility, strength, stamina, balance, safety, adaptive techniques and equipment, self-care, bowel and bladder mgt, patient and caregiver education, . Goals have been set at supervision PT, OT, and SLP. Anticipated discharge destination is home.       See Team Conference Notes for weekly updates to the plan of care

## 2023-09-20 NOTE — Progress Notes (Signed)
 Physical Therapy Session Note  Patient Details  Name: Joel Williams MRN: [id number] Date of Birth: 10-27-1950  Today's Date: 09/20/2023 PT Individual Time: 9195-9084 PT Individual Time Calculation (min): 71 min   Short Term Goals: Week 1:  PT Short Term Goal 1 (Week 1): Pt will perform bed mobility with overall supervision. PT Short Term Goal 2 (Week 1): Pt will perform all transfers with no AD and overall supervision. PT Short Term Goal 3 (Week 1): Pt will ambulate community distances with no AD and minimal path deviations with supervision/ CGA. PT Short Term Goal 4 (Week 1): Pt will safely navigate steps as per home environment with close supervision.  Skilled Therapeutic Interventions/Progress Updates:     Pt received semi reclined in bed and agrees to therapy. Reports some pain in head, but improved from previous sessions. PT provides rest breaks as needed to manage pain. Pt performs supine to sit with cues for positioning. Pt able to don socks and shoes with cues for safety and body mechanics. Pt performs stand step transfer to [facility] with CGA and cues for positioning. WC transport to gym for time management. Pt performs sit to stand with cues for initiation. Pt ambulates x175' with RW and cues for upright posture to improve balance and increasing stride length to decrease risk for falls. Following, PT suggests that pt attempt ambulation without RW, and pt reports feeling like he is not ready yet. Pt performs standing and alternating toe taps on cone with RUE support on RW, with PT providing CGA/minA, then x10 with LUE support and same assistance. Pt performs x10 reps sit to stand with arms folded across chest to promote increased balance demand and strengthening aspect for lower extremities. PT provides cues for body mechanics and eccentric control of stand to sit for safety and strengthening. Pt then completes x20 alternating foot taps on cone without upper extremity support with minA at  trunk for stability and light facilitation of lateral weight shifting, as well as cueing to widen base of support for increased stability. Pt progresses to completing foot taps on 8 step with CGA and improving consistency of weight shifting and stability. Following rest break, pt performs x10 step ups on 8 step without UE support and with CGA/minA for stability. WC transport back to room. PT signs daughter Olam off for transfers in room. Pt left seated with all needs within reach.     Therapy Documentation Precautions:  Precautions Precautions: Fall Precaution/Restrictions Comments: R craniotomy, R HOH; L-sided weakness Restrictions Weight Bearing Restrictions Per Provider Order: No    Therapy/Group: Individual Therapy  [name], PT, DPT 09/20/2023, 5:00 PM

## 2023-09-20 NOTE — Progress Notes (Signed)
 Speech Language Pathology Daily Session Note  Patient Details  Name: Joel Williams MRN: [id number] Date of Birth: 10-30-1950  Today's Date: 09/20/2023 SLP Individual Time: 1447-1530 SLP Individual Time Calculation (min): 43 min  Short Term Goals: Week 1: SLP Short Term Goal 1 (Week 1): Pt will complete alternating attention tasks with >75% accuracy given min cues for at least 5 minutes. SLP Short Term Goal 2 (Week 1): Pt will complete functional problem solving tasks with >80% accuracy given min cues.  Skilled Therapeutic Interventions: Skilled therapy session focused on cognitive goals. At the beginning of session, patient shared biographical information including family, job, and medical history. Patient shared he has not noticed cognitive changes, though wife endorsing STM deficits and occasional hallucinations.  SLP facilitated completion of medication management task to encourage use of problem solving, memory and organizational skills. SLP  provided supervisionA for patient to complete BID pillbox according to written and verbalized directions. Patient with x1 mistake, though able to self correct with supervision cues. SLP educated patient on continuing to utilize pillbox upon d/c to aid in planning, organization, and memory. Patient verbalized understanding. Patient left in bed with alarm set and call bell in reach. Continue POC.   Pain None reported   Therapy/Group: Individual Therapy  .Delano Scardino M.A., CCC-SLP 09/20/2023, 7:47 AM

## 2023-09-20 NOTE — Progress Notes (Signed)
 Inpatient Rehabilitation  Patient information reviewed and entered into eRehab system by [facility]. Karen Kays., CCC/SLP, PPS Coordinator.  Information including medical coding, functional ability and quality indicators will be reviewed and updated through discharge.

## 2023-09-20 NOTE — Inpatient Diabetes Management (Addendum)
 Inpatient Diabetes Program Recommendations  AACE/ADA: New Consensus Statement on Inpatient Glycemic Control (2015)  Target Ranges:  Prepandial:   less than 140 mg/dL      Peak postprandial:   less than 180 mg/dL (1-2 hours)      Critically ill patients:  140 - 180 mg/dL    Latest Reference Range & Units 09/14/23 15:42  Hemoglobin A1C 4.8 - 5.6 % 7.4 (H)  (H): Data is abnormally high  Latest Reference Range & Units 09/19/23 01:00 09/19/23 05:52 09/19/23 11:42 09/19/23 16:25 09/19/23 20:30  Glucose-Capillary 70 - 99 mg/dL 869 (H) 852 (H)  3 units Novolog   132 (H)  Novolog  HELD  Semglee  HELD 156 (H)  9 units Novolog  @1840  239 (H)  7 units Novolog   40 units Semglee   (H): Data is abnormally high  Latest Reference Range & Units 09/20/23 05:33  Glucose-Capillary 70 - 99 mg/dL 835 (H)  9 units Novolog   40 units Semglee   (H): Data is abnormally high    Admit 07/01 for stereotactic right temporal occipital craniotomy for resection of tumor   History: DM2  Home DM Meds: Insulin  Pump       Metformin  1000 mg  BID       Ozempic  2 mg Qweek       Freestyle Libre 3 plus CGM  Current Orders: Semglee  40 units BID      Novolog  Resistant Correction Scale/ SSI (0-20 units) TID AC + HS      Novolog  5 units TID with meals    MD- Novolog  12pm doses and AM Semglee  doses HELD yesterday AM Overall CBGs are OK yesterday and today  MD notes from 07/06 stated pt was still having issues with Nausea, HA, Confusion Decadron  restarted  Would continue SQ Insulin  regimen for now and not have pt restart his home insulin  pump until pt closer to time to discharge home (given pt having intermittent nausea, confusion, etc)  If pt desires to use home Freestyle libre 3 plus glucose sensor, pt will need to have family bring sensors and the reader to hospital We will still need to complete BID fingerstick CBGs with the hospital meter (8am, 10pm)      Omnipod Dash with the [employer] 2 with  ReliOn regular insulin  ENDO: Dr. Faythe Uses Insulin  Pump + Lantus  80 units daily  Gets total of 150.7 units basal insulin  per day Basal pump setting: 12 am-3.2 units/hr 3am-2.75 units/hr 8am-3 units/hr 12pm-3 units/hr 5pm-3.15 units/hr 8pm-2.5 units/hr Total daily basal-70.7 units   Carb ratio: 1 unit for every 6 carbs Correction factor: 1:40 Target: 200 mg/dL    --Will follow patient during hospitalization--  Adina Rudolpho Arrow RN, MSN, CDCES Diabetes Coordinator Inpatient Glycemic Control Team Team Pager: [phone] (8a-5p)

## 2023-09-20 NOTE — Progress Notes (Addendum)
 Nutrition Consult  Received consult for assessment of nutrition status. Patient was admitted to CIR on 09/17/23 with functional deficits d/t R temporal tumor. S/P R craniotomy 09/14/23. Path report high-grade glioma. PMH includes, DM-2, HLD, HTN, asthma, CKD, CAD, GERD.  Spoke with patient and his family at bedside. Patient reports some weight loss over the past few months d/t decreased appetite. He has been on Ozempic  since May 2024, but he thinks his weight loss is r/t the brain tumor, not Ozempic . He typically wears an Omnipod insulin  pump and Freestyle Libre CGM. He does not have any questions about his diet, but would like some information when he is discharged home. Patient has had 7% weight loss within the past year, which is not significant for the time frame. He has been eating well since admission to CIR, although he does report change in taste preferences and reduced appetite over the past 1-2 months. At this time, his nutritional needs are being met with current carb modified diet.   Wt Readings from Last 15 Encounters:  09/17/23 99.3 kg  09/14/23 99.3 kg  09/10/23 99.3 kg  08/26/23 100.3 kg  08/23/23 99.8 kg  08/22/23 99.8 kg  08/19/23 99.8 kg  12/25/22 107 kg  06/10/22 107.1 kg  06/09/22 117.9 kg  12/22/21 108.9 kg  06/04/21 115.7 kg  04/28/21 114.8 kg  04/25/21 115.8 kg  04/19/20 113.6 kg    Body mass index is 32.33 kg/m. Patient meets criteria for obesity based on current BMI.   Nutrition Focused Physical Exam: Flowsheet Row Most Recent Value  Orbital Region No depletion  Upper Arm Region No depletion  Thoracic and Lumbar Region No depletion  Buccal Region No depletion  Temple Region No depletion  Clavicle Bone Region No depletion  Clavicle and Acromion Bone Region No depletion  Scapular Bone Region No depletion  Dorsal Hand No depletion  Patellar Region Mild depletion  Anterior Thigh Region Mild depletion  Posterior Calf Region No depletion  Edema (RD Assessment)  None  Hair Reviewed  Eyes Reviewed  Mouth Reviewed  Skin Reviewed  Nails Reviewed     Current diet order is carbohydrate modified, patient is consuming approximately 100% of meals at this time. Labs and medications reviewed.   Heart Healthy Consistent Carbohydrate Nutrition Therapy handout provided in discharge summary. No further nutrition interventions warranted at this time. If nutrition issues arise, please consult RD.   Suzen HUNT RD, LDN, CNSC Contact via secure chat. If unavailable, use group chat RD Inpatient.

## 2023-09-20 NOTE — Progress Notes (Signed)
 Occupational Therapy Session Note  Patient Details  Name: Joel Williams MRN: [id number] Date of Birth: December 27, 1950  Today's Date: 09/20/2023 OT Individual Time: 1030-1145 OT Individual Time Calculation (min): 75 min    Short Term Goals: Week 1:  OT Short Term Goal 1 (Week 1): Pt will sequence ADL task with Min cuing. OT Short Term Goal 2 (Week 1): Pt will manage LB dressing with consistent supervision + LRAD. OT Short Term Goal 3 (Week 1): Pt will identify errors made within functional task Mod questioning cues.  Skilled Therapeutic Interventions/Progress Updates:    Pt received in room with family present. Pt agreeable to a shower. Obtained a shower cap for craniotomy incision and a cover for his IV on R hand.   Overall pt was close supervision with ALL tasks and not needing any cues except for min cues with orienting LB clothing before donning and cues to place L foot in first. Pt used RW in room.  Pt then ambulated with RW to day room with close supervision.  Pt worked on B hand coordination with seated ball bounces on floor and overhead throws.  Pt then worked on ball bounces from standing.    During rest break pt visited briefly with a friend that came to see him.   Pt discussing his home activities and the community/ church groups he is involved in.    Pt returned to room by ambulating without RW and light CGA.  Pt opted to lay down to rest. Bed alarm on and all needs met.   Therapy Documentation Precautions:  Precautions Precautions: Fall Precaution/Restrictions Comments: R craniotomy, R HOH; L-sided weakness Restrictions Weight Bearing Restrictions Per Provider Order: No   Pain:  Pt stated headache had resolved at start of this session, no pain  ADL: ADL Eating: Supervision/safety, Set up Where Assessed-Eating: Wheelchair Grooming: Supervision/safety Where Assessed-Grooming: Sitting at sink Upper Body Bathing: Supervision/safety, Setup Where Assessed-Upper Body  Bathing: Shower Lower Body Bathing: Supervision/safety Where Assessed-Lower Body Bathing: Shower Upper Body Dressing: Supervision/safety Where Assessed-Upper Body Dressing: Edge of bed Lower Body Dressing: Supervision/safety, Minimal cueing Where Assessed-Lower Body Dressing: Edge of bed Toileting: Supervision/safety Where Assessed-Toileting: Toilet, Hotel manager: Close supervision, Furniture conservator/restorer Method: Proofreader: Gaffer: Unable to assess Film/video editor: Close supervision, Administrator, arts Method: Designer, industrial/product: Emergency planning/management officer, Grab bars   Therapy/Group: Individual Therapy  Rayquon Uselman 09/20/2023, 12:57 PM

## 2023-09-20 NOTE — Care Management (Signed)
 Inpatient Rehabilitation Center Individual Statement of Services  Patient Name:  Joel Williams  Date:  09/20/2023  Welcome to the Inpatient Rehabilitation Center.  Our goal is to provide you with an individualized program based on your diagnosis and situation, designed to meet your specific needs.  With this comprehensive rehabilitation program, you will be expected to participate in at least 3 hours of rehabilitation therapies Monday-Friday, with modified therapy programming on the weekends.  Your rehabilitation program will include the following services:  Physical Therapy (PT), Occupational Therapy (OT), Speech Therapy (ST), 24 hour per day rehabilitation nursing, Therapeutic Recreaction (TR), Psychology, Neuropsychology, Care Coordinator, Rehabilitation Medicine, Nutrition Services, Pharmacy Services, and Other  Weekly team conferences will be held on Tuesdays to discuss your progress.  Your Inpatient Rehabilitation Care Coordinator will talk with you frequently to get your input and to update you on team discussions.  Team conferences with you and your family in attendance may also be held.  Expected length of stay: 10-12 days    Overall anticipated outcome: Independent   Depending on your progress and recovery, your program may change. Your Inpatient Rehabilitation Care Coordinator will coordinate services and will keep you informed of any changes. Your Inpatient Rehabilitation Care Coordinator's name and contact numbers are listed  below.  The following services may also be recommended but are not provided by the Inpatient Rehabilitation Center:  Driving Evaluations Home Health Rehabiltiation Services Outpatient Rehabilitation Services Vocational Rehabilitation   Arrangements will be made to provide these services after discharge if needed.  Arrangements include referral to agencies that provide these services.  Your insurance has been verified to be:  Medicare A/B  Your primary  doctor is:  Cleatus Specking  Pertinent information will be shared with your doctor and your insurance company.  Inpatient Rehabilitation Care Coordinator:  Graeme Feliciana SILK [phone] or ([phone]  Information discussed with and copy given to patient by: Graeme DELENA Feliciana, 09/20/2023, 2:30 PM

## 2023-09-20 NOTE — Progress Notes (Signed)
 Patient ID: Joel Williams, male   DOB: 1950/10/15, 73 y.o.   MRN: [id number] Met with the patient and daughter to review current medical situation, rehab process, team conference and plan of care. Discussed medications for secondary risk management and CMM diet.  Patient noted taking Ozempic  PTA and other medication during the week for DM; anxious to get back on the CGM and insulin  pump.  Reviewed Keppra  for seizure prophylaxis and potassium supplement for hypokalemia.  Reported Fioricet  added for headaches was effective.  Reviewed MyChart access; wife keeping up with medications and appointments, etc.  Patient expressed interest in bx, result and follow up; PAC, Dan made aware of questions. Continue to follow along to address educational needs to facilitate preparation for discharge. Fredericka Barnie NOVAK

## 2023-09-21 DIAGNOSIS — C719 Malignant neoplasm of brain, unspecified: Secondary | ICD-10-CM | POA: Diagnosis not present

## 2023-09-21 LAB — GLUCOSE, CAPILLARY
Glucose-Capillary: 186 mg/dL — ABNORMAL HIGH (ref 70–99)
Glucose-Capillary: 148 mg/dL — ABNORMAL HIGH (ref 70–99)
Glucose-Capillary: 159 mg/dL — ABNORMAL HIGH (ref 70–99)
Glucose-Capillary: 110 mg/dL — ABNORMAL HIGH (ref 70–99)

## 2023-09-21 MED ORDER — LORATADINE 10 MG PO TABS
10.0000 mg | ORAL_TABLET | Freq: Every day | ORAL | Status: DC
  Administered 2023-09-21 – 2023-09-25 (×5): 10 mg via ORAL
  Filled 2023-09-21 (×5): qty 1

## 2023-09-21 MED ORDER — SENNOSIDES-DOCUSATE SODIUM 8.6-50 MG PO TABS
1.0000 | ORAL_TABLET | Freq: Two times a day (BID) | ORAL | Status: DC
  Administered 2023-09-22 – 2023-09-25 (×7): 1 via ORAL
  Filled 2023-09-21 (×7): qty 1

## 2023-09-21 MED ORDER — POLYETHYLENE GLYCOL 3350 17 G PO PACK
17.0000 g | PACK | Freq: Every day | ORAL | Status: DC
  Administered 2023-09-22 – 2023-09-25 (×4): 17 g via ORAL
  Filled 2023-09-21 (×4): qty 1

## 2023-09-21 MED ORDER — ACETAMINOPHEN 325 MG PO TABS: 325.0000 mg | ORAL_TABLET | ORAL | Status: DC | PRN

## 2023-09-21 NOTE — Plan of Care (Signed)
  Problem: Consults Goal: RH BRAIN INJURY PATIENT EDUCATION Description: Description: See Patient Education module for eduction specifics Outcome: Progressing   Problem: RH BOWEL ELIMINATION Goal: RH STG MANAGE BOWEL WITH ASSISTANCE Description: STG Manage Bowel with mod I Assistance. Outcome: Progressing Goal: RH STG MANAGE BOWEL W/MEDICATION W/ASSISTANCE Description: STG Manage Bowel with Medication with mod I Assistance. Outcome: Progressing   Problem: RH BLADDER ELIMINATION Goal: RH STG MANAGE BLADDER WITH ASSISTANCE Description: STG Manage Bladder With  toileting Assistance Outcome: Progressing   Problem: RH SAFETY Goal: RH STG ADHERE TO SAFETY PRECAUTIONS W/ASSISTANCE/DEVICE Description: STG Adhere to Safety Precautions With Assistance/Device. Outcome: Progressing   Problem: RH PAIN MANAGEMENT Goal: RH STG PAIN MANAGED AT OR BELOW PT'S PAIN GOAL Description: < 4 with prns Outcome: Progressing   Problem: RH KNOWLEDGE DEFICIT BRAIN INJURY Goal: RH STG INCREASE KNOWLEDGE OF SELF CARE AFTER BRAIN INJURY Description: Patient and Spouse will be able to manage care using educational resources for medications and dietary modification independently Outcome: Progressing

## 2023-09-21 NOTE — Progress Notes (Addendum)
 PROGRESS NOTE   Subjective/Complaints:  No events overnight. Patient was asking about biopsy results yesterday; Rolan Haggard PA talked to oncology about coming by to discuss.  Patient feeling great today; slept well last night, still with some headaches but well controlled on current regimen. Vitasls stable, BG well controlled  ROS: as per HPI. Denies CP, SOB, abd pain, N/V/D, or any other complaints at this time.   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 EXAM: CT HEAD WITHOUT CONTRAST TECHNIQUE: Contiguous axial images were obtained from the base of the skull through the vertex without intravenous contrast. RADIATION DOSE REDUCTION: This exam was performed according to the departmental dose-optimization program which includes automated exposure control, adjustment of the mA and/or kV according to patient size and/or use of iterative reconstruction technique. COMPARISON:  Postoperative brain MRI 09/15/2023. FINDINGS: Brain: Small volume postoperative pneumocephalus. Right temporal lobe resection cavity with heterogeneous mixed density fluid and small volume postoperative gas. Small volume intraventricular hemorrhage not apparent now. Small volume postoperative subdural collection and/or dural repair. Stable ventricle size and configuration. No significant midline shift. Basilar cisterns are patent. Gray-white differentiation elsewhere within normal limits. Vascular: Calcified atherosclerosis at the skull base. No suspicious intracranial vascular hyperdensity. Skull: Posterior right craniotomy. Sinuses/Orbits: Partial opacified right tympanic cavity and mastoid status post craniotomy closely abutting the right mastoid air cells. Other paranasal sinuses and mastoids are well  aerated. Other: Postoperative changes to the right scalp. Skin staples in place. Negative orbits soft tissues. IMPRESSION: 1. Recent resection of Right temporal lobe mass, stable from postoperative MRI. 2. No new intracranial abnormality identified. Electronically Signed   By: VEAR Hurst M.D.   On: 09/19/2023 10:56   Recent Labs    09/20/23 0500  WBC 4.4  HGB 13.5  HCT 39.6  PLT 198   Recent Labs    09/20/23 0500  NA 134*  K 4.5  CL 96*  CO2 24  GLUCOSE 157*  BUN 8  CREATININE 0.66  CALCIUM 9.4        Intake/Output Summary (Last 24 hours) at 09/21/2023 1004 Last data filed at 09/21/2023 0700 Gross per 24 hour  Intake 356 ml  Output --  Net 356 ml         Physical Exam: Vital Signs Blood pressure (!) 160/81, pulse 66, temperature 97.7 F (36.5 C), temperature source Oral, resp. rate 18, height 5' 9 (1.753 m), weight 99.3 kg, SpO2 97%.   Gen: no distress, appropriate appearance for age, walking with PT  HEENT: oral mucosa pink and moist,  HOH R>L Cardio: Reg rate and rhythm, no m/r/g Chest: normal effort, normal rate of breathing, CTAB Abd: soft, non-distended, nonTTP, +BS throughout, normoactive Ext: No peripheral edema. Psych: Appropriate mood and affect. Skin: intact except R crani incision, covered.  Dried blood overlying; no active drainage.  Neuro:  Awake, alert.  Can follow all simple commands. Oriented to person, place, and time without cues. No apparent memory deficits - improved Strength: 4+ out of 5 left upper and lower extremity,  5 out of 5 right upper and lower extremity. Sensation: Intact in all 4 extremities Coordination: Impaired left upper extremity finger-to-nose, left lower extremity heel-to-shin. Tone: No spasticity    Assessment/Plan: 1. Functional deficits which require 3+ hours per day of interdisciplinary therapy in a comprehensive inpatient rehab setting. Physiatrist is providing close team supervision and 24 hour management of active  medical problems listed below. Physiatrist and rehab team continue to assess barriers to discharge/monitor patient progress toward functional and medical goals  Care Tool:  Bathing    Body parts bathed by patient: Right arm, Left arm, Chest, Abdomen, Front perineal area, Buttocks, Right upper leg, Left upper leg, Right lower leg, Left lower leg, Face         Bathing assist Assist Level: Contact Guard/Touching assist     Upper Body Dressing/Undressing Upper body dressing   What is the patient wearing?: Pull over shirt    Upper body assist Assist Level: Supervision/Verbal cueing    Lower Body Dressing/Undressing Lower body dressing      What is the patient wearing?: Underwear/pull up, Pants     Lower body assist Assist for lower body dressing: Contact Guard/Touching assist     Toileting Toileting    Toileting assist Assist for toileting: Contact Guard/Touching assist     Transfers Chair/bed transfer  Transfers assist     Chair/bed transfer assist level: Contact Guard/Touching assist     Locomotion Ambulation   Ambulation assist      Assist level: Contact Guard/Touching assist Assistive device: Walker-rolling Max distance: 250 ft   Walk 10 feet activity   Assist     Assist level: Contact Guard/Touching assist Assistive device: No Device   Walk 50 feet activity   Assist    Assist level: Contact Guard/Touching assist Assistive device: Walker-rolling    Walk 150 feet activity   Assist    Assist level: Contact Guard/Touching assist Assistive device: Walker-rolling    Walk 10 feet on uneven surface  activity   Assist Walk 10 feet on uneven surfaces activity did not occur: Safety/medical concerns         Wheelchair     Assist Is the patient using a wheelchair?: No (w/c brought during ambulation in eval but not used; may require during future therapy sessions for fatigue.)             Wheelchair 50 feet with 2 turns  activity    Assist            Wheelchair 150 feet activity     Assist          Blood pressure (!) 160/81, pulse 66, temperature 97.7 F (36.5 C), temperature source Oral, resp. rate 18, height 5' 9 (1.753 m), weight 99.3 kg, SpO2 97%.  Medical Problem List and Plan: 1. 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  Status post right temporal occipital craniotomy for resection of tumor.  Path report high-grade glioma.  Follow-up neuro-oncology outpatient.  Decadron  taper completed             -patient may shower             -ELOS/Goals: 10-14 days - 712             -Continue CIR -09/19/23 pt with increased HA and n/v overnight, confusion/change in activity today -reached out to NSG Gerard Beck NP who recommended head CT without contrast-- negative -no MRI for now, per Dr. Mavis -restart decadron  2mg  TID x2 days, ordered -zofran  PRN  already ordered -monitor closely for changes, appreciate NSG input 7/7: CT negative--doing much better this a.m. after medication changes.  7/8: spv with OT yesterday. Per PT, cognition limiting but can walk 350 ft with RW. He has trouble figuring out where to hold his arms . Memory improving with SLP but still with difficulty with organization and structuring.--c/b baseline deficits. Not impulsive.   2.  Antithrombotics: -DVT/anticoagulation:  Pharmaceutical: Heparin  5000U q8h initiated 09/16/2023             -antiplatelet therapy: Aspirin  on hold due to craniotomy   3. Pain Management: continue tylenol  and Hydrocodone  as needed-- decreased tylenol  PRN to avoid overdosing since hydrocodone  also has 325mg .  -09/19/23 increased headaches as above, started topamax  and fioricet  as above-- MONITOR TOTAL TYLENOL  INTAKE CLOSELY started Topamax  25mg  daily, for now dosed at dinner time to avoid fatigue during the day, but timing could be adjusted as primary team sees fit  -started Fioricet  1 tab q4h PRN (just need to keep an eye on tylenol   total dose-- advised nursing of this)  7/7: Using fioricet  consistently w/ norco; symptoms much improved with this and resumption of Decadron ..  Continue current regimen, if staying below Tylenol  limits.--stable  4. Mood/Behavior/Sleep: Provide emotional support             -antipsychotic agents: N/A  -09/18/23 poor sleep but doesn't want meds; monitor for now  7-7: Intermittent confusion concerning for development of delirium; will place precautions, get sleep log.  7/8: no log, but endorses good sleep.  5. Neuropsych/cognition: This patient is not capable of making decisions on his own behalf.   7/8: wife reporting cognitive decline PTA, was saying he was going places and was actually getting lost. Likely close to SPV level at baseline.   6. Skin/Wound Care: Routine skin checks   7. Fluids/Electrolytes/Nutrition: Routine in and outs with follow-up chemistries -Hypokalemia: K 2.9 on admission, given KCL 40mEq x3, recheck Monday.     - 7/7: Nutrition consulted for poor POs--appreciate assessment  8.  Seizure prophylaxis.  Continue Keppra  500 mg twice daily   9.  Hypertension. continue Avapro  150 mg daily, Norvasc  10 mg nightly, Toprol -XL 50 mg daily.  Monitor with increased mobility  -09/18/23 BPs fine, monitor  -09/19/23 BPs a little up today but could be from pain-- monitor closely.   7/7-7/8: BP is looking better with headache control.  Vitals:   09/18/23 2009 09/19/23 0055 09/19/23 0437 09/19/23 0957  BP: 126/66 (!) 161/76 (!) 140/74 (!) 155/81   09/19/23 1315 09/19/23 1522 09/19/23 1939 09/20/23 0310  BP: (!) 151/81 (!) 147/74 (!) 155/74 139/81   09/20/23 1257 09/20/23 1900 09/20/23 2220 09/21/23 0606  BP: 133/73 136/70 130/78 (!) 160/81    10.  Diabetes mellitus.  Patient with insulin  pump PTA as well as Glucophage  1000 mg twice daily and semaglutide .  Currently on NovoLog  5 units 3 times daily, Semglee  40 units twice daily. And SSI -09/18/23 CBGs great, advised that if he brings his  freestyle monitor, we can confirm accuracy and use that for CBG checks. Continue current regimen. -09/19/23 CBGs lower today, didn't eat much yesterday. But restarting decadron  today so expect CBGs to go up; monitor  7/7: BG increased due to decadron  - monitor today then increase semglee  as indicated 7/8: Well controlled, stopping decadron  tomorrow so will keep current regimen  CBG (last 3)  Recent Labs    09/21/23 1118 09/21/23 1620 09/21/23 2109  GLUCAP 148* 159* 110*  11.  Hyperlipidemia.  Zocor  20mg  daily  12.  CKD.  Seen by renal services in the past and has since signed off.  Follow-up chemistries   - stable  13.  CAD.  Low-dose aspirin  on hold.  No chest pain or shortness of breath  14.  GERD.  Protonix  40mg  nightly  15.  History of asthma.  Albuterol  inhaler as needed.  16.  Class I obesity.  BMI 32.33.  Dietary follow-up  17. ABLA: Hgb 12.9 7/4, monitor routine labs  18. Bowel management: -09/19/23 LBM 7/4, exam today with slightly hypoactive BS but abd soft and nontender, having n/v but suspect it's from headaches, have low threshold for abd xray if symptoms persist/worsen; monitor closely, may need bowel meds if no BM by tomorrow, but also not eating well.   7/7: Last BM 7-4, ate poorly yesterday, feeling much better today.  No concerning signs on exam. 7/8:add sennakot s 1 tab BID, daily miralax . Exam benign  19. R ear fullness/HOH. Notable opacification on CT head. - Add claritin  10 mg daily    LOS: 4 days A FACE TO FACE EVALUATION WAS PERFORMED  Joesph JAYSON Likes 09/21/2023, 10:04 AM

## 2023-09-21 NOTE — Progress Notes (Signed)
 Speech Language Pathology Daily Session Note  Patient Details  Name: Joel Williams MRN: [id number] Date of Birth: October 13, 1950  Today's Date: 09/21/2023 SLP Individual Time: 0800-0900 SLP Individual Time Calculation (min): 60 min  Short Term Goals: Week 1: SLP Short Term Goal 1 (Week 1): Pt will complete alternating attention tasks with >75% accuracy given min cues for at least 5 minutes. SLP Short Term Goal 2 (Week 1): Pt will complete functional problem solving tasks with >80% accuracy given min cues.  Skilled Therapeutic Interventions:   Pt greeted at bedside and his daughter/granddaughter arrived shortly after SLP. He was very pleasant and cooperative throughout tx tasks targeting cognition. During initial conversation re upcoming team conference and subsequent discussion of d/c planning, he demonstrated adequate reasoning and problem solving. He also demonstrated good intellectual awareness, however reduced emergent awareness noted during structured tasks. He benefited from modA overall for this. Durig structued mildly complex money management task via menu, he benefited from minA cues for attention to detail and alternating attention. Anticipate visual attention deficits negatively impacted success as well. After pt reported difficulty w/ a routine organization task via excel, SLP initiated complex organization task. He required modA cues for organization and problem solving throughout. Of note, pt appeared to utilize humor to cover up errors. At the end of tx tasks, he was left in his bed with his family present. Call light within reach. Recommend cont ST per POC.    Pain Pain Assessment Pain Scale: 0-10 Pain Score: 4  Pain Location: Head Pain Orientation: Right Pain Intervention(s): Medication (See eMAR)  Therapy/Group: Individual Therapy  Joel Williams 09/21/2023, 9:19 AM

## 2023-09-21 NOTE — Progress Notes (Signed)
 Physical Therapy Session Note  Patient Details  Name: Joel Williams MRN: [id number] Date of Birth: 06/23/1950  Today's Date: 09/21/2023 PT Individual Time: 0918-1000 PT Individual Time Calculation (min): 42 min   Today's Date: 09/21/2023 PT Individual Time: 8584-8484 PT Individual Time Calculation (min): 60 min   Short Term Goals: Week 1:  PT Short Term Goal 1 (Week 1): Pt will perform bed mobility with overall supervision. PT Short Term Goal 2 (Week 1): Pt will perform all transfers with no AD and overall supervision. PT Short Term Goal 3 (Week 1): Pt will ambulate community distances with no AD and minimal path deviations with supervision/ CGA. PT Short Term Goal 4 (Week 1): Pt will safely navigate steps as per home environment with close supervision.  Skilled Therapeutic Interventions/Progress Updates:     1st Session: Pt received semi reclined in bed and agrees to therapy. No complaint of pain. Supine to sit with cues for positioning at EOB. Pt performs stand step transfer to [facility] with cues for sequencing and positioning. WC transport to gym. Pt stands and ambulates x350' with CGA and no AD, with cues for upright gaze to improve posture and balance, and increasing trunk rotation and arm swing to improve balance. Pt then completes Tug test with following times: 12.2 seconds, 12.1 seconds, and 11.0 seconds. Seated rest breaks between each bout. Pt ambulates x175' while holding onto tidal tank to provide coordination and upper extremity strength challenge. Pt transitions to narrower set of handles and ambulates additional x175' with similar cues and assistance. Pt takes seated rest break then ambulates back to room. Left seated in WC with all needs within reach.   2nd Session: Pt received seated in WC and agrees to hterapy. Wife present. Pt does not complain of pain. PT signs pt's wife off on performing ambulatory transfers in room. Pt ambulates to main gym with CGA and cues for trunk rotation  and arm swing to improve balance. Pt completes standing balance activity with cognitive overlay, tasked with completing peg pattern recreation while standing on airex pad to provide unreliable somatosensory input. Pt requires occasional cues for correct completion, and occasional minA to correct for posterior bias. Pt ambulates to dayroom. Pt performs sequencing challenge, tasked with ambulating across room to retrieve photo cards of activities in temporal sequence. Pt then ambulates back across room to place cards in correct order. PT provides CGA for safety but no noted LOBs during activity, and pt requires only one cue to correct sequencing of cards. Activity progressed with inclusion of exercise step which pt is tasked with stepping over while going back and forth to complete activity.  Pt completes with CGA and similar cueing. Pt then completes Nustep for endurance training. Pt completes x12:00 at workload of 5 with average steps per minute ~70. PT provides cues for hand and foot placement and completing full available ROM. Pt ambulates back to room with same assistance and cues. Pt left seated in WC with all needs within reach.   Therapy Documentation Precautions:  Precautions Precautions: Fall Precaution/Restrictions Comments: R craniotomy, R HOH; L-sided weakness Restrictions Weight Bearing Restrictions Per Provider Order: No   Therapy/Group: Individual Therapy  [name], PT, DPT 09/21/2023, 4:52 PM

## 2023-09-21 NOTE — Progress Notes (Signed)
 Patient ID: JERELLE VIRDEN, male   DOB: July 10, 1950, 73 y.o.   MRN: [id number]   SW met with pt, pt dtr ,and granddaughter to introduce self,explain role, discuss discharge process, inform on updates from team conference, d/c date 7/12, and Discharge recs- outpatient PT/OT/SLP and 3in1 BSC. Preferred outpatient location-[facility]. Fam edu on Thursday 1pm-4pm with pt wife, dtr, and one dtr via face time. SW will order 3in1 bedside commode.   Graeme Jude, MSW, LCSW Office: [phone] Cell: [phone] Fax: [phone]

## 2023-09-21 NOTE — Progress Notes (Signed)
 Occupational Therapy Session Note  Patient Details  Name: Joel Williams MRN: [id number] Date of Birth: 20-Jun-1950  Today's Date: 09/21/2023 OT Individual Time: 1120-1205 OT Individual Time Calculation (min): 45 min    Short Term Goals: Week 1:  OT Short Term Goal 1 (Week 1): Pt will sequence ADL task with Min cuing. OT Short Term Goal 2 (Week 1): Pt will manage LB dressing with consistent supervision + LRAD. OT Short Term Goal 3 (Week 1): Pt will identify errors made within functional task Mod questioning cues.  Skilled Therapeutic Interventions/Progress Updates:    Pt received sitting up with no c/o pain, agreeable to OT session. Pt completed 200 ft of functional mobility to the therapy gym with close (S). Began session with high intensity UE boxing activity in standing to challenge UE endurance, cardiovascular endurance, and postural control in standing for reduced fall risk. He completed 3x1 min intervals. Then transitioned into more complex dual processing tasks. He completed functional mobility with cueing for increasing pace, holding a tennis racket with a cone balancing on it, as well as completing animal naming task to challenge higher level attention and balance. He required CGA overall for safety. He completed over 1000 ft of functional mobility during task. He completed BITS activity for memory- 3 item sequencing with 85% accurate. He returned to his room and was left sitting up with all needs met, family present.   Therapy Documentation Precautions:  Precautions Precautions: Fall Precaution/Restrictions Comments: R craniotomy, R HOH; L-sided weakness Restrictions Weight Bearing Restrictions Per Provider Order: No  Therapy/Group: Individual Therapy  Nena VEAR Moats 09/21/2023, 6:14 AM

## 2023-09-22 ENCOUNTER — Other Ambulatory Visit: Payer: Self-pay | Admitting: *Deleted

## 2023-09-22 DIAGNOSIS — D496 Neoplasm of unspecified behavior of brain: Secondary | ICD-10-CM

## 2023-09-22 DIAGNOSIS — C719 Malignant neoplasm of brain, unspecified: Secondary | ICD-10-CM | POA: Diagnosis not present

## 2023-09-22 LAB — GLUCOSE, CAPILLARY
Glucose-Capillary: 142 mg/dL — ABNORMAL HIGH (ref 70–99)
Glucose-Capillary: 79 mg/dL (ref 70–99)
Glucose-Capillary: 125 mg/dL — ABNORMAL HIGH (ref 70–99)
Glucose-Capillary: 109 mg/dL — ABNORMAL HIGH (ref 70–99)

## 2023-09-22 MED ORDER — ACETAMINOPHEN 325 MG PO TABS: 325.0000 mg | ORAL_TABLET | ORAL | Status: DC | PRN

## 2023-09-22 MED ORDER — SORBITOL 70 % SOLN
30.0000 mL | Freq: Every day | Status: DC | PRN
  Administered 2023-09-24 – 2023-09-25 (×2): 30 mL via ORAL
  Filled 2023-09-22 (×3): qty 30

## 2023-09-22 NOTE — Progress Notes (Signed)
 Patient ID: Joel Williams, male   DOB: 12-Nov-1950, 73 y.o.   MRN: [id number]  SW confirmed with pt dtr fam edu tomorrow 1pm-4pm.  SW ordered 3in1 BSC with [employer] via parachute.   Graeme Jude, MSW, LCSW Office: [phone] Cell: [phone] Fax: [phone]

## 2023-09-22 NOTE — Progress Notes (Signed)
 Physical Therapy Session Note  Patient Details  Name: Joel Williams MRN: [id number] Date of Birth: 1950/04/24  Today's Date: 09/22/2023 PT Individual Time: 1047-1200 PT Individual Time Calculation (min): 73 min   Short Term Goals: Week 1:  PT Short Term Goal 1 (Week 1): Pt will perform bed mobility with overall supervision. PT Short Term Goal 2 (Week 1): Pt will perform all transfers with no AD and overall supervision. PT Short Term Goal 3 (Week 1): Pt will ambulate community distances with no AD and minimal path deviations with supervision/ CGA. PT Short Term Goal 4 (Week 1): Pt will safely navigate steps as per home environment with close supervision.  Skilled Therapeutic Interventions/Progress Updates:     Pt received supine in bed asleep. Awakens easily to verbal stimuli and agrees to therapy. No complaint of pain. Pt performs bed mobility independently. Sit to stand with cues for initiation. Pt ambulates to dayroom with wide base of support and short stride lengths. PT provides cues to correct and pt does not require any physical assistance. Pt completes Nustep for endurance training. Pt completes x12:00 at workload of 6 with average step per minute ~60. PT provides cues fo rhand and foot placement and completing full available ROM. Following extended seated rest break, pt completes 6 minute walk test without AD, with distance of 650'. PT provides cues for upright gaze to improve posture and balance, and increasing step height bilaterally to prevent foot drag during swing phase. Pt performs standing balance activity for NMR and cognition, tasked with performing foot taps on number latex circles. Initially pt performs one-step commands with close supervision, then activity progressed by having pt tap multiple numbers to add to create desired sum. Pt requires occasional verbal cues for correct performance and has x1 posterior LOB, but is able to prevent fall utilizing backs of legs against mat  table. Following rest break, pt ambulates x450' with close supervision, with task of naming supermarket items with each letter of the alphabet. Activity challenges multitasking ability and dynamic standing balance. Pt completes without physical assistance but is noted to have slowed gait speed with cognitive challenge. Pt left seated in bed with all needs within reach and wife present.   Therapy Documentation Precautions:  Precautions Precautions: Fall Precaution/Restrictions Comments: R craniotomy, R HOH; L-sided weakness Restrictions Weight Bearing Restrictions Per Provider Order: No   Therapy/Group: Individual Therapy  [name], PT, DPT 09/22/2023, 4:21 PM

## 2023-09-22 NOTE — Progress Notes (Signed)
 Occupational Therapy Session Note  Patient Details  Name: Joel Williams MRN: [id number] Date of Birth: 16-Dec-1950  Today's Date: 09/22/2023 OT Individual Time: 8694-8584 OT Individual Time Calculation (min): 70 min    Short Term Goals: Week 1:  OT Short Term Goal 1 (Week 1): Pt will sequence ADL task with Min cuing. OT Short Term Goal 2 (Week 1): Pt will manage LB dressing with consistent supervision + LRAD. OT Short Term Goal 3 (Week 1): Pt will identify errors made within functional task Mod questioning cues.  Skilled Therapeutic Interventions/Progress Updates:    Pt received supine with no c/o pain, agreeable to OT session. Wife present. Pt completed functional mobility into the bathroom with CGA. He required min cueing for sequencing safety when doffing clothing. He completed bathing with appropriate sequencing, min cueing/education for sitting to wash feet. Provided education to his wife while he was in the shower re CLOF, supervision needs at home, and overview of bathing, dressing, and ADL transfers. She was very comfortable with all education and reporting we've been helping him before so felt no need to practice further. He finished his bathing- all completed with close (S) and then transferred to EOB with CGA. He donned underwear and pants with close (S). He requested to sit for shaving task, completing with appropriate sequencing and divided attention as he spoke casually with OT. He finished shaving and donned a shirt with (S). He completed 200 ft of functional mobility to the therapy gym with CGA- close (S). OT performed visual testing- pt with L>R peripheral deficits but no field cut or hemianopsia. He does still have L inattention that combined with peripheral loss puts him at a fall risk- discussed this with him and his daughter in the room. He completed visual scanning activity on the BITS with L quadrants a full second slower on reaction time compared to R quadrants. Emphasized  importance of head turn in unfamiliar environments. He returned to his room and was left sitting up with all needs met, daughter present.   Therapy Documentation Precautions:  Precautions Precautions: Fall Precaution/Restrictions Comments: R craniotomy, R HOH; L-sided weakness Restrictions Weight Bearing Restrictions Per Provider Order: No  Therapy/Group: Individual Therapy  Nena VEAR Moats 09/22/2023, 9:42 AM

## 2023-09-22 NOTE — Patient Care Conference (Signed)
 Inpatient RehabilitationTeam Conference and Plan of Care Update Date: 09/21/2023   Time: 10:04 AM    Patient Name: Joel Williams      Medical Record Number: [id number]  Date of Birth: 19-Jul-1950 Sex: Male         Room/Bed: 4W15C/4W15C-01 Payor Info: Payor: MEDICARE / Plan: MEDICARE PART A AND B / Product Type: *No Product type* /    Admit Date/Time:  09/17/2023  1:13 PM  Primary Diagnosis:  Glioma [facility])  Hospital Problems: Principal Problem:   Glioma [facility])    Expected Discharge Date: Expected Discharge Date: 09/25/23  Team Members Present: Physician leading conference: Dr. Joesph Likes Social Worker Present: Graeme Jude, LCSW Nurse Present: Barnie Ronde, RN PT Present: Kirt Dawn, PT OT Present: Nena Moats, OT SLP Present: Recardo Mole, SLP     Current Status/Progress Goal Weekly Team Focus  Bowel/Bladder   pt is continent of b/b   remain continent   offer toileting q4h/ prn    Swallow/Nutrition/ Hydration   regular/thin - no dysphagia intervention warranted           ADL's   on admission, pt required CGA overall. Today pt only needed supervision with min cues for LB dressing to orient the clothing.   supervision overall (may need to upgrade toileting, toilet transfer)   ADL training,  functional balance and mobility, cognitive skills, pt /fam education    Mobility   supervision bed mobility, CGA transfers and ambulation without AD x350'   Supervision  cognition, multitasking, family ed, endurance, balance    Communication                Safety/Cognition/ Behavioral Observations  mild cognitive deficits w/ attention, problem solving, and memory though able to complete cognitive tasks such as med/money/time management   supervision   problem solving, sustained attention, awareness, STM; cognitive re training; pt/family education    Pain   Pt c/o headaches 6 or >  to manage pain level of 4 or lower   assess pain level q shift/ prn     Skin   pt has staples to the right side of head from cranitomy   to keep staples clean and intact  assess site and skin q shift/ prn      Discharge Planning:  Pt will d/c to home with his wife as primary caregiver. PRN support from children and extended family. SW will confirm there are no barriers to discharge.   Team Discussion: Patient post tumor resection/crani with headaches and memory issues. Functional progress limited by lack of confidence and mild cognitive deficits noted with structured tasks and, poor organization.  Patient on target to meet rehab goals: yes, currently needs CGA for ADLs with minimal cues.  Able to ambulate up to 350' without an assistive device. Goals for discharge set for supervision overall.  *See Care Plan and progress notes for long and short-term goals.   Revisions to Treatment Plan:  N/a   Teaching Needs: Safety, cues, medications, transfers, toileting, etc.   Current Barriers to Discharge: Decreased caregiver support  Possible Resolutions to Barriers: Family education OP follow up services     Medical Summary Current Status: medically complicated by headache, nausea, confusion, poor sleep, hypertension, hyperglycemia, CKD, and memory deficits  Barriers to Discharge: Behavior/Mood;Inadequate Nutritional Intake;Medical stability;Pending chemo/radiation;Pending surgery/plan;Self-care education;Uncontrolled Diabetes;Uncontrolled Pain   Possible Resolutions to [employer] Focus: management of headaches with medication, encourage PO intakes, review biopsy results with Oncology, manage BG elevations while on decadron   Continued Need for Acute Rehabilitation Level of Care: The patient requires daily medical management by a physician with specialized training in physical medicine and rehabilitation for the following reasons: Direction of a multidisciplinary physical rehabilitation program to maximize functional independence : Yes Medical  management of patient stability for increased activity during participation in an intensive rehabilitation regime.: Yes Analysis of laboratory values and/or radiology reports with any subsequent need for medication adjustment and/or medical intervention. : Yes   I attest that I was present, lead the team conference, and concur with the assessment and plan of the team.   Fredericka Sober B 09/22/2023, 8:20 AM

## 2023-09-22 NOTE — Progress Notes (Signed)
 Speech Language Pathology Daily Session Note  Patient Details  Name: Joel Williams MRN: [id number] Date of Birth: 10-09-1950  Today's Date: 09/22/2023 SLP Individual Time: 0800-0900 SLP Individual Time Calculation (min): 60 min  Short Term Goals: Week 1: SLP Short Term Goal 1 (Week 1): Pt will complete alternating attention tasks with >75% accuracy given min cues for at least 5 minutes. SLP Short Term Goal 2 (Week 1): Pt will complete functional problem solving tasks with >80% accuracy given min cues.  Skilled Therapeutic Interventions:   Pt greeted at bedside. He was awake/alert upon SLP arrival and very cooperative throughout tx tasks targeting cognition. SLP facilitated mildly complex written time management task as well as verbal money calculation task (comparing values). He benefited from s cues for attention to details, information processing, and working memory. Improved organization and problem solving noted as compared to prev tx session. He then completed an organization and problem solving task ID partial words w/ given category, benefiting from minA cues for problem solving, memory, and word finding throughout this. During conversation, pt did endorse memory deficts ~3 months prior to surgery. He demonstrated fair awareness and was receptive to education re cognitive deficits and their negative impairment on return to ADLs/IADLs. Unable to complete conversation d/t time constraints, but would benefit from continued education re return to IADLs, especially not returning to driving. At the end of tx tasks, he was left in his bed with the alarm set and call light within reach. Recommend cont ST per POC.    Pain  No pain reported  Therapy/Group: Individual Therapy  Recardo DELENA Mole 09/22/2023, 8:17 AM

## 2023-09-22 NOTE — Progress Notes (Signed)
 Occupational Therapy Session Note  Patient Details  Name: Joel Williams MRN: [id number] Date of Birth: [birth year]  Today's Date: 09/22/2023 OT Individual Time: 0930-1000 OT Individual Time Calculation (min): 30 min    Short Term Goals: Week 1:  OT Short Term Goal 1 (Week 1): Pt will sequence ADL task with Min cuing. OT Short Term Goal 2 (Week 1): Pt will manage LB dressing with consistent supervision + LRAD. OT Short Term Goal 3 (Week 1): Pt will identify errors made within functional task Mod questioning cues.  Skilled Therapeutic Interventions/Progress Updates:    Skilled OT intervention with focus on functional amb without AD, ST memory, standing balance, path finding, activity tolerance, and safety awareness to increase independence with BADLs. All amb throughout unit with CGA and no AD. Pt challenged to remember sequence of tasks (locate laundry basket in gym, place towels in basket, transport to ortho gym, and return basket to gym) while engaged in conversation regarding previous career and current interests. Pt recalled all steps of task and was able to locate all items and return to starting point. Pt amb to day room-5 mins NuStep level 5 60 SPM. Pt able to locate and return to room. Pt reamined seated EOB with bed alarm activated. Wife present.   Therapy Documentation Precautions:  Precautions Precautions: Fall Precaution/Restrictions Comments: R craniotomy, R HOH; L-sided weakness Restrictions Weight Bearing Restrictions Per Provider Order: No   Pain: Pt denies pain this morning    Therapy/Group: Individual Therapy  Maritza Debby Mare 09/22/2023, 11:08 AM

## 2023-09-22 NOTE — Progress Notes (Addendum)
 PROGRESS NOTE   Subjective/Complaints:  No events overnight.  No complaints, will family at bedside this a.m. happy with patient's progress.  He does continue to have intermittent headaches, but feels that the severity is coming down and much better controlled with Fioricet .  Has not noticed any benefits from cetirizine yet, but wants to continue.  Patient's family inquiring regarding results of his biopsy; I have not heard back yet from neurosurgery or neuro-oncology.   ROS: as per HPI. Denies CP, SOB, abd pain, N/V/D, or any other complaints at this time.   +Headaches  Objective:   No results found.  Recent Labs    09/20/23 0500  WBC 4.4  HGB 13.5  HCT 39.6  PLT 198   Recent Labs    09/20/23 0500  NA 134*  K 4.5  CL 96*  CO2 24  GLUCOSE 157*  BUN 8  CREATININE 0.66  CALCIUM 9.4        Intake/Output Summary (Last 24 hours) at 09/22/2023 0855 Last data filed at 09/22/2023 0755 Gross per 24 hour  Intake 476 ml  Output --  Net 476 ml         Physical Exam: Vital Signs Blood pressure 134/79, pulse 67, temperature 97.6 F (36.4 C), temperature source Oral, resp. rate 18, height 5' 9 (1.753 m), weight 99.3 kg, SpO2 98%.   Gen: no distress, appropriate appearance for age, sitting up in bed. HEENT: oral mucosa pink and moist,  HOH R>L Cardio: Reg rate and rhythm, no m/r/g Chest: normal effort, normal rate of breathing, CTAB Abd: soft, non-distended, nonTTP, +BS throughout, normoactive Ext: No peripheral edema. Psych: Appropriate mood and affect. Skin: intact except R crani incision, covered.  Dried blood overlying; no active drainage.  Unchanged from prior exams.  Neuro:  Awake, alert.  Can follow all simple commands. Oriented to person, place, and time without cues. No apparent memory deficits - improved Strength: 4+ out of 5 left upper and lower extremity, 5 out of 5 right upper and lower  extremity. Sensation: Intact in all 4 extremities Coordination: Impaired left upper extremity finger-to-nose, left lower extremity heel-to-shin. Tone: No spasticity  Physical exam unchanged from the above on reexamination 09/22/23    Assessment/Plan: 1. Functional deficits which require 3+ hours per day of interdisciplinary therapy in a comprehensive inpatient rehab setting. Physiatrist is providing close team supervision and 24 hour management of active medical problems listed below. Physiatrist and rehab team continue to assess barriers to discharge/monitor patient progress toward functional and medical goals  Care Tool:  Bathing    Body parts bathed by patient: Right arm, Left arm, Chest, Abdomen, Front perineal area, Buttocks, Right upper leg, Left upper leg, Right lower leg, Left lower leg, Face         Bathing assist Assist Level: Contact Guard/Touching assist     Upper Body Dressing/Undressing Upper body dressing   What is the patient wearing?: Pull over shirt    Upper body assist Assist Level: Supervision/Verbal cueing    Lower Body Dressing/Undressing Lower body dressing      What is the patient wearing?: Underwear/pull up, Pants     Lower body assist Assist for  lower body dressing: Contact Guard/Touching assist     Toileting Toileting    Toileting assist Assist for toileting: Contact Guard/Touching assist     Transfers Chair/bed transfer  Transfers assist     Chair/bed transfer assist level: Contact Guard/Touching assist     Locomotion Ambulation   Ambulation assist      Assist level: Contact Guard/Touching assist Assistive device: Walker-rolling Max distance: 250 ft   Walk 10 feet activity   Assist     Assist level: Contact Guard/Touching assist Assistive device: No Device   Walk 50 feet activity   Assist    Assist level: Contact Guard/Touching assist Assistive device: Walker-rolling    Walk 150 feet activity   Assist     Assist level: Contact Guard/Touching assist Assistive device: Walker-rolling    Walk 10 feet on uneven surface  activity   Assist Walk 10 feet on uneven surfaces activity did not occur: Safety/medical concerns         Wheelchair     Assist Is the patient using a wheelchair?: No (w/c brought during ambulation in eval but not used; may require during future therapy sessions for fatigue.)             Wheelchair 50 feet with 2 turns activity    Assist            Wheelchair 150 feet activity     Assist          Blood pressure 134/79, pulse 67, temperature 97.6 F (36.4 C), temperature source Oral, resp. rate 18, height 5' 9 (1.753 m), weight 99.3 kg, SpO2 98%.  Medical Problem List and Plan: 1. 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  Status post right temporal occipital craniotomy for resection of tumor.  Path report high-grade glioma.  Follow-up neuro-oncology outpatient.  Decadron  taper completed             -patient may shower             -ELOS/Goals: 10-14 days - 7/12             -Continue CIR -09/19/23 pt with increased HA and n/v overnight, confusion/change in activity today -reached out to NSG Joel Beck NP who recommended head CT without contrast-- negative -no MRI for now, per Dr. Mavis -restart decadron  2mg  TID x2 days, ordered -zofran  PRN already ordered -monitor closely for changes, appreciate NSG input 7/7: CT negative--doing much better this a.m. after medication changes.  7/8: spv with OT yesterday. Per PT, cognition limiting but can walk 350 ft with RW. He has trouble figuring out where to hold his arms . Memory improving with SLP but still with difficulty with organization and structuring.--c/b baseline deficits. Not impulsive.  7-9: Informed therapies to monitor for cognitive decline and worsening symptoms with steroid discontinuation; reports that he did very well today.  Monitor closely.  Patient informed to  follow-up with neuro-oncology as outpatient for results of biopsy.  2.  Antithrombotics: -DVT/anticoagulation:  Pharmaceutical: Heparin  5000U q8h initiated 09/16/2023             -antiplatelet therapy: Aspirin  on hold due to craniotomy   3. Pain Management: continue tylenol  and Hydrocodone  as needed-- decreased tylenol  PRN to avoid overdosing since hydrocodone  also has 325mg .  -09/19/23 increased headaches as above, started topamax  and fioricet  as above-- MONITOR TOTAL TYLENOL  INTAKE CLOSELY started Topamax  25mg  daily, for now dosed at dinner time to avoid fatigue during the day, but timing could be adjusted as primary team  sees fit  -started Fioricet  1 tab q4h PRN (just need to keep an eye on tylenol  total dose-- advised nursing of this)  7/7: Using fioricet  consistently w/ norco; symptoms much improved with this and resumption of Decadron ..  Continue current regimen, if staying below Tylenol  limits.--stable  7-9: Has not used Norco since 7-6; will DC.  Increase Tylenol  to 325 to 650 mg every 4 hours as needed for mild to moderate pain.  Using Fioricet  about once daily.  4. Mood/Behavior/Sleep: Provide emotional support             -antipsychotic agents: N/A  -09/18/23 poor sleep but doesn't want meds; monitor for now  7-7: Intermittent confusion concerning for development of delirium; will place precautions, get sleep log.  7/8: no log, but endorses good sleep.  5. Neuropsych/cognition: This patient is not capable of making decisions on his own behalf.   7/8: wife reporting cognitive decline PTA, was saying he was going places and was actually getting lost. Likely close to SPV level at baseline.   6. Skin/Wound Care: Routine skin checks   - Surgery done 7-1; will need dressings and staples removed 7-11  7. Fluids/Electrolytes/Nutrition: Routine in and outs with follow-up chemistries -Hypokalemia: K 2.9 on admission, given KCL 40mEq x3, recheck Monday.     - 7/7: Nutrition consulted for poor  POs--appreciate assessment  8.  Seizure prophylaxis.  Continue Keppra  500 mg twice daily   9.  Hypertension. continue Avapro  150 mg daily, Norvasc  10 mg nightly, Toprol -XL 50 mg daily.  Monitor with increased mobility  -09/18/23 BPs fine, monitor  -09/19/23 BPs a little up today but could be from pain-- monitor closely.   - BP is looking better with headache control.  Vitals:   09/19/23 1315 09/19/23 1522 09/19/23 1939 09/20/23 0310  BP: (!) 151/81 (!) 147/74 (!) 155/74 139/81   09/20/23 1257 09/20/23 1900 09/20/23 2220 09/21/23 0606  BP: 133/73 136/70 130/78 (!) 160/81   09/21/23 1246 09/21/23 1956 09/21/23 2108 09/22/23 0645  BP: 115/70 (!) 122/58 (!) 102/53 134/79    10.  Diabetes mellitus.  Patient with insulin  pump PTA as well as Glucophage  1000 mg twice daily and semaglutide .  Currently on NovoLog  5 units 3 times daily, Semglee  40 units twice daily. And SSI -09/18/23 CBGs great, advised that if he brings his freestyle monitor, we can confirm accuracy and use that for CBG checks. Continue current regimen. -09/19/23 CBGs lower today, didn't eat much yesterday. But restarting decadron  today so expect CBGs to go up; monitor  7/7: BG increased due to decadron  - monitor today then increase semglee  as indicated 7/8: Well controlled, stopping decadron  tomorrow so will keep current regimen  7-9: Well-controlled, monitor  CBG (last 3)  Recent Labs    09/21/23 1620 09/21/23 2109 09/22/23 0646  GLUCAP 159* 110* 142*    11.  Hyperlipidemia.  Zocor  20mg  daily  12.  CKD.  Seen by renal services in the past and has since signed off.  Follow-up chemistries   - stable  13.  CAD.  Low-dose aspirin  on hold.  No chest pain or shortness of breath  14.  GERD.  Protonix  40mg  nightly  15.  History of asthma.  Albuterol  inhaler as needed.  16.  Class I obesity.  BMI 32.33.  Dietary follow-up  17. ABLA: Hgb 12.9 7/4, monitor routine labs  18. Bowel management: -09/19/23 LBM 7/4, exam today with  slightly hypoactive BS but abd soft and nontender, having n/v but suspect  it's from headaches, have low threshold for abd xray if symptoms persist/worsen; monitor closely, may need bowel meds if no BM by tomorrow, but also not eating well.   7/7: Last BM 7-4, ate poorly yesterday, feeling much better today.  No concerning signs on exam.  7/8:add sennakot s 1 tab BID, daily miralax . Exam benign  7/9: add as needed sorbitol  to use if no bowel movement today  19. R ear fullness/HOH. Notable opacification on CT head. - Add claritin  10 mg daily--monitor for 2 to 3 days    LOS: 5 days A FACE TO FACE EVALUATION WAS PERFORMED  Joel Williams Likes 09/22/2023, 8:55 AM

## 2023-09-23 LAB — GLUCOSE, CAPILLARY
Glucose-Capillary: 75 mg/dL (ref 70–99)
Glucose-Capillary: 176 mg/dL — ABNORMAL HIGH (ref 70–99)
Glucose-Capillary: 86 mg/dL (ref 70–99)
Glucose-Capillary: 150 mg/dL — ABNORMAL HIGH (ref 70–99)
Glucose-Capillary: 144 mg/dL — ABNORMAL HIGH (ref 70–99)

## 2023-09-23 MED ORDER — POLYETHYLENE GLYCOL 3350 17 G PO PACK: 17.0000 g | PACK | Freq: Every day | ORAL | Status: DC | PRN

## 2023-09-23 MED ORDER — INSULIN GLARGINE-YFGN 100 UNIT/ML ~~LOC~~ SOLN
20.0000 [IU] | Freq: Two times a day (BID) | SUBCUTANEOUS | Status: DC
  Administered 2023-09-24 – 2023-09-25 (×3): 20 [IU] via SUBCUTANEOUS
  Filled 2023-09-23 (×4): qty 0.2

## 2023-09-23 MED ORDER — METFORMIN HCL ER 500 MG PO TB24
1000.0000 mg | ORAL_TABLET | Freq: Two times a day (BID) | ORAL | Status: DC
  Administered 2023-09-24 – 2023-09-25 (×3): 1000 mg via ORAL
  Filled 2023-09-23 (×3): qty 2

## 2023-09-23 NOTE — Progress Notes (Signed)
 Speech Language Pathology Daily Session Note  Patient Details  Name: Joel Williams MRN: [id number] Date of Birth: 09-12-1950  Today's Date: 09/23/2023 SLP Individual Time: 1310-1330 SLP Individual Time Calculation (min): 20 min and Today's Date: 09/23/2023 SLP Missed Time: 10 Minutes Missed Time Reason: Nursing care  Short Term Goals: Week 1: SLP Short Term Goal 1 (Week 1): Pt will complete alternating attention tasks with >75% accuracy given min cues for at least 5 minutes. SLP Short Term Goal 2 (Week 1): Pt will complete functional problem solving tasks with >80% accuracy given min cues.  Skilled Therapeutic Interventions:   Pt, family, and nursing greeted at bedside. Missed initial 10 mins of tx session d/t nursing care (diabetic education w/ family). He was very pleasant and cooperative throughout tx tasks targeting cognition. He was able to recall tx tasks completed in ST lately w/ minA. SLP facilitated education re cognition, d/c recs, and functional tasks to complete in the home environment. Also discussed negative impact of cognitive deficits on his return to ADLs and IADLs, including driving. He was aware of this and demonstrated adequate reasoning. SLP introduced [employer] and pt was able to complete a few trials w/ s cues for problem solving and attention to details. Throughout education, he was able to alternate attention between tasks and conversation w/ family/SLP w/ only s cues. They verbalized understanding of education and additional questions were answered. At the end of tx tasks, he was left in his chair with his call light within reach and family present. Recommend cont ST per POC.   Pain None reported   Therapy/Group: Individual Therapy  Joel Williams 09/23/2023, 1:53 PM

## 2023-09-23 NOTE — Progress Notes (Signed)
 Physical Therapy Session Note  Patient Details  Name: Joel Williams MRN: [id number] Date of Birth: 1950-10-07  Today's Date: 09/23/2023 PT Individual Time: 1045-1120 PT Individual Time Calculation (min): 35 min   Short Term Goals: Week 1:  PT Short Term Goal 1 (Week 1): Pt will perform bed mobility with overall supervision. PT Short Term Goal 2 (Week 1): Pt will perform all transfers with no AD and overall supervision. PT Short Term Goal 3 (Week 1): Pt will ambulate community distances with no AD and minimal path deviations with supervision/ CGA. PT Short Term Goal 4 (Week 1): Pt will safely navigate steps as per home environment with close supervision.  Skilled Therapeutic Interventions/Progress Updates: Pt presented in w/c with wife present agreeable to therapy. Pt denies pain and expresses gratitude towards therapy staff as pt states feels has made progress over a short period of time. Session focused on gait and multitasking activities. Pt ambulated to main gym with CGA and noted intermittent decreased foot clearance with varying step length. In gym PTA placed 3lb weights on BLE then pt ambulated ~380ft with CGA for increased proprioceptive input. PTA then removed weights and pt ambulated an additional 333ft with CGA however noted improved foot clearance and improved stride length and increased cadence. Pt then worked on scanning/retention activity finding cones placed throughout main gym. Pt required x 1 reminder of how many cones to find and required x 2 cues for scanning as pt walked past cones (mostly to L and elevated). Pt then ambulated back to room while throwing and catching ball for dual task activity. Pt noted to either decrease gait speed or throw ball in significantly smaller range. When back in room pt returned to w/c and left seated with wife present and current needs met.      Therapy Documentation Precautions:  Precautions Precautions: Fall Precaution/Restrictions  Comments: R craniotomy, R HOH; L-sided weakness Restrictions Weight Bearing Restrictions Per Provider Order: No General:   Vital Signs: Therapy Vitals Temp: 98 F (36.7 C) Temp Source: Oral Pulse Rate: 70 Resp: 15 BP: 113/60 Patient Position (if appropriate): Sitting Oxygen Therapy SpO2: 98 % O2 Device: Room Air  Therapy/Group: Individual Therapy  Devoiry Corriher 09/23/2023, 2:52 PM

## 2023-09-23 NOTE — Inpatient Diabetes Management (Signed)
 Inpatient Diabetes Program Recommendations  AACE/ADA: New Consensus Statement on Inpatient Glycemic Control (2015)  Target Ranges:  Prepandial:   less than 140 mg/dL      Peak postprandial:   less than 180 mg/dL (1-2 hours)      Critically ill patients:  140 - 180 mg/dL   Lab Results  Component Value Date   GLUCAP 176 (H) 09/23/2023   HGBA1C 7.4 (H) 09/14/2023    Spoke with pt, wife and daughter at bedside regarding the nned for insulin  pens at home and holing insulin  pump therapy unitl follow up with Endocrinologist, Dr. Faythe, and scheduling appointment with insulin  pump trainer. They are on board with plan. Discussed the different types of insulin  and when to give them. Basal insulin  Correction scale insulin  Meal coverage insulin   Demonstrated how to use the insulin  pen. Pt to continue to use the [employer] 3 plus CGM family to download [employer] app to remotely monitor pt glucose trends.  Discussed glucose and A1c goals. Discussed hypo and hyperglycemia s/s and treatment for both.  Will see pt and family again tomorrow at 12pm, for review for comfort prior to discharge.  Thanks,  Clotilda Bull RN, MSN, BC-ADM Inpatient Diabetes Coordinator Team Pager [phone] (8a-5p)

## 2023-09-23 NOTE — Plan of Care (Signed)
  Problem: Consults Goal: RH BRAIN INJURY PATIENT EDUCATION Description: Description: See Patient Education module for eduction specifics Outcome: Progressing   Problem: RH BOWEL ELIMINATION Goal: RH STG MANAGE BOWEL WITH ASSISTANCE Description: STG Manage Bowel with mod I Assistance. Outcome: Progressing Goal: RH STG MANAGE BOWEL W/MEDICATION W/ASSISTANCE Description: STG Manage Bowel with Medication with mod I Assistance. Outcome: Progressing   Problem: RH BLADDER ELIMINATION Goal: RH STG MANAGE BLADDER WITH ASSISTANCE Description: STG Manage Bladder With  toileting Assistance Outcome: Progressing   Problem: RH SAFETY Goal: RH STG ADHERE TO SAFETY PRECAUTIONS W/ASSISTANCE/DEVICE Description: STG Adhere to Safety Precautions With Assistance/Device. Outcome: Progressing   Problem: RH PAIN MANAGEMENT Goal: RH STG PAIN MANAGED AT OR BELOW PT'S PAIN GOAL Description: < 4 with prns Outcome: Progressing   Problem: RH KNOWLEDGE DEFICIT BRAIN INJURY Goal: RH STG INCREASE KNOWLEDGE OF SELF CARE AFTER BRAIN INJURY Description: Patient and Spouse will be able to manage care using educational resources for medications and dietary modification independently Outcome: Progressing

## 2023-09-23 NOTE — Progress Notes (Signed)
 Occupational Therapy Session Note  Patient Details  Name: Joel Williams MRN: [id number] Date of Birth: 01-01-1951  Today's Date: 09/23/2023 OT Individual Time: 1020-1045 OT Individual Time Calculation (min): 25 min   Today's Date: 09/23/2023 OT Individual Time: 8652-8574 OT Individual Time Calculation (min): 38 min    Short Term Goals: Week 1:  OT Short Term Goal 1 (Week 1): Pt will sequence ADL task with Min cuing. OT Short Term Goal 2 (Week 1): Pt will manage LB dressing with consistent supervision + LRAD. OT Short Term Goal 3 (Week 1): Pt will identify errors made within functional task Mod questioning cues.  Skilled Therapeutic Interventions/Progress Updates:   Session 1: Pt greeted sitting EOB for skilled OT session with focus on BADL participation, spouse present.   Pain: Pt reports un-rated headache pain with plan to take medication post session. OT offering intermediate rest breaks and positioning suggestions throughout session to address pain/fatigue and maximize participation/safety in session.   Pt declines bathing but receptive to changing clothes and shaving. Spouse retrieves clothes and places within reach. Pt completes sit<>stands for LB clothing management with supervision, using EOB for external balance support but no formal AD. Spouse cues patient to sit for unthreading of garments, demo carryover from previous OT session during which education happened. Pt manages footwear with use of figure-4 for LLE and reaching distally for RLE. Pt requests assistance from spouse to doff/don t-shirts overhead to avoid pulling on bandage/staples. Pt completes ambulatory transfer from EOB>sink-side with supervision + no AD, sitting in WC to shave with use of electric shaver. Pt able to hold biographical conversation with therapist throughout task, requiring cuing to turn water off once task was finished. Pt remained sitting at sink in care of spouse. Pt continues to be appropriate for  skilled OT intervention to promote further functional independence in ADLs/IADLs.    Session 2: Pt greeted sitting in [facility] for skilled OT session with focus on caregiver education and discharge planning.   Pain: Pt with no reports of pain, but visibly fatigued at end of session. OT offering intermediate rest breaks and positioning suggestions throughout session to address pain/fatigue and maximize participation/safety in session.   Spouse and two daughters present for scheduled family education. Emphasis placed on demo/performing walk-in shower transfers stepping forward with use of L grab bar already installed. OT recommending installation of grab bar to the right of patient (in front while seated on chair) as well for improved safety with standing care. Reviewed visual/perceptual deficits including peripheral field changes and L-sided inattention. Recommended follow-up with ophthalmologist for further treatment. Examples provided on home/community level implications. Re-enforced independence with ADLs/routine. Lastly reviewed energy conservation techniques to manage physical and cognitive endurance.   Pt remained sitting in WC with 4Ps assessed and immediate needs met. Pt continues to be appropriate for skilled OT intervention to promote further functional independence in ADLs/IADLs.   Therapy Documentation Precautions:  Precautions Precautions: Fall Precaution/Restrictions Comments: R craniotomy, R HOH; L-sided weakness Restrictions Weight Bearing Restrictions Per Provider Order: No   Therapy/Group: Individual Therapy  Nereida Habermann, OTR/L, MSOT  09/23/2023, 5:14 AM

## 2023-09-23 NOTE — Progress Notes (Signed)
 Physical Therapy Discharge Summary  Patient Details  Name: Joel Williams MRN: [id number] Date of Birth: 1950/11/28  Date of Discharge from PT service:September 24, 2023  Today's Date: 09/24/2023 PT Individual Time: 1300-1415 PT Individual Time Calculation (min): 75 min    Patient has met 8 of 8 long term goals due to improved activity tolerance, improved balance, improved postural control, increased strength, improved attention, improved awareness, and improved coordination.  Patient to discharge at an ambulatory level Supervision.   Patient's care partner is independent to provide the necessary physical and cognitive assistance at discharge.  Reasons goals not met: NA  Recommendation:  Patient will benefit from ongoing skilled PT services in outpatient setting to continue to advance safe functional mobility, address ongoing impairments in balance, ambulation, endurance, and minimize fall risk.  Equipment: No equipment provided  Reasons for discharge: treatment goals met and discharge from hospital  Patient/family agrees with progress made and goals achieved: Yes  PT Discharge Precautions/Restrictions Precautions Precautions: Fall Restrictions Weight Bearing Restrictions Per Provider Order: No Pain Interference Pain Interference Pain Effect on Sleep: 1. Rarely or not at all Pain Interference with Therapy Activities: 0. Does not apply - I have not received rehabilitationtherapy in the past 5 days Pain Interference with Day-to-Day Activities: 1. Rarely or not at all Vision/Perception  Vision - History Ability to See in Adequate Light: 0 Adequate Vision - Assessment Eye Alignment: Within Functional Limits Ocular Range of Motion: Within Functional Limits Alignment/Gaze Preference: Within Defined Limits Tracking/Visual Pursuits: Able to track stimulus in all quads without difficulty Saccades: Within functional limits Convergence: Within functional  limits Perception Perception: Impaired Preception Impairment Details: Inattention/Neglect Perception-Other Comments: mild Praxis Praxis: WFL Praxis Impairment Details: Organization  Cognition Overall Cognitive Status: Impaired/Different from baseline Arousal/Alertness: Awake/alert Orientation Level: Oriented X4 Safety/Judgment: Appears intact Sensation Sensation Light Touch: Appears Intact Coordination Gross Motor Movements are Fluid and Coordinated: Yes Fine Motor Movements are Fluid and Coordinated: No Coordination and Movement Description: Deficits due to generalized weakness and decreased balance strategies; Mild dysmetria in LUE/essential tremors in BUE; Mild L-sided inattention Heel Shin Test: WFL; decreased time with LLE Motor  Motor Motor: Hemiplegia Motor - Discharge Observations: Very mild. Improved from eval'  Mobility Bed Mobility Bed Mobility: Supine to Sit;Sit to Supine Supine to Sit: Independent Sit to Supine: Independent Transfers Transfers: Sit to Stand;Stand to Sit;Stand Pivot Transfers Sit to Stand: Independent Stand to Sit: Independent Stand Pivot Transfers: Independent Locomotion  Gait Ambulation: Yes Gait Assistance: Independent with assistive device Gait Distance (Feet): 500 Feet Assistive device: None Gait Gait: Yes Gait Pattern: Step-through pattern;Decreased stance time - left;Decreased hip/knee flexion - left Stairs / Additional Locomotion Stairs: Yes Stairs Assistance: Independent with assistive device Stair Management Technique: One rail Right Number of Stairs: 14 Height of Stairs: 6 Curb: Supervision/Verbal cueing Pick up small object from the floor assist level: Contact Guard/Touching assist Pick up small object from the floor assistive device: table Wheelchair Mobility Wheelchair Mobility: No  Trunk/Postural Assessment  Cervical Assessment Cervical Assessment: Within Functional Limits Thoracic Assessment Thoracic Assessment:  Within Functional Limits Lumbar Assessment Lumbar Assessment: Within Functional Limits Postural Control Postural Control: Deficits on evaluation Righting Reactions: Decreased/Delayed Protective Responses: Decreased/Delayed  Balance Balance Balance Assessed: Yes Static Sitting Balance Static Sitting - Balance Support: Feet supported Static Sitting - Level of Assistance: 7: Independent Dynamic Sitting Balance Dynamic Sitting - Balance Support: During functional activity Dynamic Sitting - Level of Assistance: 6: Modified independent (Device/Increase time) Dynamic Sitting - Balance Activities: Lateral lean/weight  shifting;Forward lean/weight shifting;Reaching for objects;Reaching across midline Static Standing Balance Static Standing - Balance Support: No upper extremity supported;During functional activity Static Standing - Level of Assistance: 6: Modified independent (Device/Increase time) Dynamic Standing Balance Dynamic Standing - Balance Support: No upper extremity supported Dynamic Standing - Level of Assistance: 5: Stand by assistance Extremity Assessment      RLE Assessment RLE Assessment: Within Functional Limits LLE Assessment LLE Assessment: Within Functional Limits General Strength Comments: Grossly 4/5, slightly limited vs R side   [name], PT, DPT 09/23/2023, 4:09 PM

## 2023-09-23 NOTE — Progress Notes (Signed)
 Physical Therapy Session Note  Patient Details  Name: Joel Williams MRN: [id number] Date of Birth: 1950-04-07  Today's Date: 09/23/2023 PT Individual Time: 0802-0900 PT Individual Time Calculation (min): 58 min   Today's Date: 09/23/2023 PT Individual Time: 1450-1530 PT Individual Time Calculation (min): 40 min   Short Term Goals: Week 1:  PT Short Term Goal 1 (Week 1): Pt will perform bed mobility with overall supervision. PT Short Term Goal 2 (Week 1): Pt will perform all transfers with no AD and overall supervision. PT Short Term Goal 3 (Week 1): Pt will ambulate community distances with no AD and minimal path deviations with supervision/ CGA. PT Short Term Goal 4 (Week 1): Pt will safely navigate steps as per home environment with close supervision.  Skilled Therapeutic Interventions/Progress Updates:     1st Session: Pt received supine in bed asleep. Slow to awaken and agrees to therapy. No complaint of pain. Pt performs bed mobility independently. Pt performs sit to stand with cues for initiation, then begins ambulating without AD with cues for increased trunk and hip extension to improve balance and posture, and increasing stride length and gait speed to decrease risk for falls. Pt ambulates to elevators and performs navigation of elevator and manipulates buttons with cues for sequencing and PT providing multi-step cues to challenge pt's ability to perform tasks in sequence and utilize short-term memory. Pt ambulates to exit doors and able to manipulate doors to go outside. Pt then performs ambulation outdoors for gait training over unlevel and varying surfaces, including up and down ramps, x4 steps with Rt Hand rail, crossing streets with use of crosswalk and cues for safety, and navigating obstacles in open environment. Pt ambulates >2000' overall prior to taking extended seated rest break. Pt then ambulates back inside and manages elevator and return to room using environmental cues  for pathfinding. Pt left seated at EOB with alarm intact and all needs within reach.   2nd Session: Pt received seated in [facility] and agrees to therapy. No complaint of pain. Wife and daughter present for family education, and 2nd daughter present via Facetime. PT provides update on pt's mobility progress with therapy as well as recommendations for safe mobility following discharge. PT answers family questions and concerns regarding routines, ADs, outings, safety, and necessary cueing to provide pt.  Pt left seated in WC with all needs within reach.   Therapy Documentation Precautions:  Precautions Precautions: Fall Precaution/Restrictions Comments: R craniotomy, R HOH; L-sided weakness Restrictions Weight Bearing Restrictions Per Provider Order: No    Therapy/Group: Individual Therapy  [name], PT, DPT 09/23/2023, 3:46 PM

## 2023-09-23 NOTE — Progress Notes (Signed)
 PROGRESS NOTE   Subjective/Complaints:  No events overnight.  No complaints Patient doing very well today, no concerns.  Later, family reports to nursing some reports of patient seeing a tree in the center of his vision, as well as seeing some things in the peripheral of his vision.  ROS: as per HPI. Denies CP, SOB, abd pain, N/V/D, or any other complaints at this time.   +Headaches--ongoing, improving  Objective:   No results found.  No results for input(s): WBC, HGB, HCT, PLT in the last 72 hours.  No results for input(s): NA, K, CL, CO2, GLUCOSE, BUN, CREATININE, CALCIUM in the last 72 hours.       Intake/Output Summary (Last 24 hours) at 09/23/2023 0934 Last data filed at 09/23/2023 0700 Gross per 24 hour  Intake 950 ml  Output --  Net 950 ml         Physical Exam: Vital Signs Blood pressure (!) 136/90, pulse 65, temperature 98 F (36.7 C), resp. rate 18, height 5' 9 (1.753 m), weight 99.3 kg, SpO2 98%.   Gen: no distress, appropriate appearance for age, walking in therapy gym. HEENT: oral mucosa pink and moist,  HOH R>L Eyes: EOMI.  PERRLA.  No obvious field cuts. Cardio: Reg rate and rhythm, no m/r/g Chest: normal effort, normal rate of breathing, CTAB Abd: soft, non-distended, nonTTP, +BS throughout, normoactive Ext: No peripheral edema. Psych: Appropriate mood and affect. Skin: intact except R crani incision, covered.  Dried blood overlying; no active drainage.  Unchanged 7-10  Neuro:  Awake, alert.  Can follow all simple commands. Oriented to person, place, and time without cues. No apparent cognitive deficits; reports of hallucinations not appreciated on current exam  Strength: 4+ out of 5 left upper and lower extremity, 5 out of 5 right upper and lower extremity. Sensation: Intact in all 4 extremities Coordination: Impaired left upper extremity finger-to-nose, left  lower extremity heel-to-shin. Tone: No spasticity Gait: Steady stance and stride, no obvious ataxia or balance deficits.  Multitasks with upper extremities.  Physical exam unchanged from the above on reexamination 09/23/23    Assessment/Plan: 1. Functional deficits which require 3+ hours per day of interdisciplinary therapy in a comprehensive inpatient rehab setting. Physiatrist is providing close team supervision and 24 hour management of active medical problems listed below. Physiatrist and rehab team continue to assess barriers to discharge/monitor patient progress toward functional and medical goals  Care Tool:  Bathing    Body parts bathed by patient: Right arm, Left arm, Chest, Abdomen, Front perineal area, Buttocks, Right upper leg, Left upper leg, Right lower leg, Left lower leg, Face         Bathing assist Assist Level: Supervision/Verbal cueing     Upper Body Dressing/Undressing Upper body dressing   What is the patient wearing?: Pull over shirt    Upper body assist Assist Level: Set up assist    Lower Body Dressing/Undressing Lower body dressing      What is the patient wearing?: Underwear/pull up, Pants     Lower body assist Assist for lower body dressing: Supervision/Verbal cueing     Toileting Toileting    Toileting assist Assist for toileting: Supervision/Verbal  cueing     Transfers Chair/bed transfer  Transfers assist     Chair/bed transfer assist level: Supervision/Verbal cueing     Locomotion Ambulation   Ambulation assist      Assist level: Contact Guard/Touching assist Assistive device: Walker-rolling Max distance: 250 ft   Walk 10 feet activity   Assist     Assist level: Contact Guard/Touching assist Assistive device: No Device   Walk 50 feet activity   Assist    Assist level: Contact Guard/Touching assist Assistive device: Walker-rolling    Walk 150 feet activity   Assist    Assist level: Contact  Guard/Touching assist Assistive device: Walker-rolling    Walk 10 feet on uneven surface  activity   Assist Walk 10 feet on uneven surfaces activity did not occur: Safety/medical concerns         Wheelchair     Assist Is the patient using a wheelchair?: No (w/c brought during ambulation in eval but not used; may require during future therapy sessions for fatigue.)             Wheelchair 50 feet with 2 turns activity    Assist            Wheelchair 150 feet activity     Assist          Blood pressure (!) 136/90, pulse 65, temperature 98 F (36.7 C), resp. rate 18, height 5' 9 (1.753 m), weight 99.3 kg, SpO2 98%.  Medical Problem List and Plan: 1. 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  Status post right temporal occipital craniotomy for resection of tumor.  Path report high-grade glioma.  Follow-up neuro-oncology outpatient.  Decadron  taper completed             -patient may shower             -ELOS/Goals: 10-14 days - 7/12             -Continue CIR -09/19/23 pt with increased HA and n/v overnight, confusion/change in activity today -reached out to [facility] Gerard Beck NP who recommended head CT without contrast-- negative -no MRI for now, per Dr. Mavis -restart decadron  2mg  TID x2 days, ordered -zofran  PRN already ordered -monitor closely for changes, appreciate NSG input 7/7: CT negative--doing much better this a.m. after medication changes.  7/8: spv with OT yesterday. Per PT, cognition limiting but can walk 350 ft with RW. He has trouble figuring out where to hold his arms . Memory improving with SLP but still with difficulty with organization and structuring.--c/b baseline deficits. Not impulsive.  7-9: Informed therapies to monitor for cognitive decline and worsening symptoms with steroid discontinuation; reports that he did very well today.  Monitor closely.  Patient informed to follow-up with neuro-oncology as outpatient for  results of biopsy.  2.  Antithrombotics: -DVT/anticoagulation:  Pharmaceutical: Heparin  5000U q8h initiated 09/16/2023             -antiplatelet therapy: Aspirin  on hold due to craniotomy   3. Pain Management: continue tylenol  and Hydrocodone  as needed-- decreased tylenol  PRN to avoid overdosing since hydrocodone  also has 325mg .  -09/19/23 increased headaches as above, started topamax  and fioricet  as above-- MONITOR TOTAL TYLENOL  INTAKE CLOSELY started Topamax  25mg  daily, for now dosed at dinner time to avoid fatigue during the day, but timing could be adjusted as primary team sees fit  -started Fioricet  1 tab q4h PRN (just need to keep an eye on tylenol  total dose-- advised nursing of this)  7/7:  Using fioricet  consistently w/ norco; symptoms much improved with this and resumption of Decadron ..  Continue current regimen, if staying below Tylenol  limits.--stable  7-9: Has not used Norco since 7-6; will DC.  Increase Tylenol  to 325 to 650 mg every 4 hours as needed for mild to moderate pain.  Using Fioricet  about once daily.  4. Mood/Behavior/Sleep: Provide emotional support             -antipsychotic agents: N/A  -09/18/23 poor sleep but doesn't want meds; monitor for now  7-7: Intermittent confusion concerning for development of delirium; will place precautions, get sleep log.  7/8: no log, but endorses good sleep.  5. Neuropsych/cognition: This patient is not capable of making decisions on his own behalf.   7/8: wife reporting cognitive decline PTA, was saying he was going places and was actually getting lost. Likely close to SPV level at baseline.   6. Skin/Wound Care: Routine skin checks   - Surgery done 7-1; will need dressings and staples removed 7-11  7. Fluids/Electrolytes/Nutrition: Routine in and outs with follow-up chemistries -Hypokalemia: K 2.9 on admission, given KCL 40mEq x3, recheck Monday.     - 7/7: Nutrition consulted for poor POs--appreciate assessment  8.  Seizure  prophylaxis.  Continue Keppra  500 mg twice daily   9.  Hypertension. continue Avapro  150 mg daily, Norvasc  10 mg nightly, Toprol -XL 50 mg daily.  Monitor with increased mobility  -09/18/23 BPs fine, monitor  -09/19/23 BPs a little up today but could be from pain-- monitor closely.   - BP is looking better with headache control.  Vitals:   09/20/23 1257 09/20/23 1900 09/20/23 2220 09/21/23 0606  BP: 133/73 136/70 130/78 (!) 160/81   09/21/23 1246 09/21/23 1956 09/21/23 2108 09/22/23 0645  BP: 115/70 (!) 122/58 (!) 102/53 134/79   09/22/23 1605 09/22/23 1949 09/22/23 2152 09/23/23 0459  BP: 130/70 (!) 122/58 (!) 102/49 (!) 136/90    10.  Diabetes mellitus.  Patient with insulin  pump PTA as well as Glucophage  1000 mg twice daily and semaglutide .  Currently on NovoLog  5 units 3 times daily, Semglee  40 units twice daily. And SSI -09/18/23 CBGs great, advised that if he brings his freestyle monitor, we can confirm accuracy and use that for CBG checks. Continue current regimen. -09/19/23 CBGs lower today, didn't eat much yesterday. But restarting decadron  today so expect CBGs to go up; monitor  7/7: BG increased due to decadron  - monitor today then increase semglee  as indicated 7/8: Well controlled, stopping decadron  tomorrow so will keep current regimen  7-9: Well-controlled, monitor  7-10: Blood sugars tightly controlled; now off of steroids.  Will DC Premeal insulin , half long-acting to 20 units twice daily, and resume metformin  1000 mg twice daily.  Semaglutide  not on formulary.  Continue SSI--will likely be able to come off long-acting insulin  and resume insulin  pump on discharge.  CBG (last 3)  Recent Labs    09/22/23 1642 09/22/23 2046 09/23/23 0611  GLUCAP 125* 109* 75    11.  Hyperlipidemia.  Zocor  20mg  daily  12.  CKD.  Seen by renal services in the past and has since signed off.  Follow-up chemistries   - stable  13.  CAD.  Low-dose aspirin  on hold.  No chest pain or shortness of  breath  14.  GERD.  Protonix  40mg  nightly  15.  History of asthma.  Albuterol  inhaler as needed.  16.  Class I obesity.  BMI 32.33.  Dietary follow-up  17. ABLA: Hgb 12.9  7/4, monitor routine labs  18. Bowel management: -09/19/23 LBM 7/4, exam today with slightly hypoactive BS but abd soft and nontender, having n/v but suspect it's from headaches, have low threshold for abd xray if symptoms persist/worsen; monitor closely, may need bowel meds if no BM by tomorrow, but also not eating well.   7/7: Last BM 7-4, ate poorly yesterday, feeling much better today.  No concerning signs on exam.  7/8:add sennakot s 1 tab BID, daily miralax . Exam benign  7/9: add as needed sorbitol  to use if no bowel movement today--no bowel movement recorded 7-10, encourage use of sorbitol   19. R ear fullness/HOH. Notable opacification on CT head. - Add claritin  10 mg daily--monitor for 2 to 3 days    LOS: 6 days A FACE TO FACE EVALUATION WAS PERFORMED  Joel Williams Likes 09/23/2023, 9:34 AM

## 2023-09-24 ENCOUNTER — Other Ambulatory Visit (HOSPITAL_COMMUNITY): Payer: Self-pay

## 2023-09-24 LAB — BASIC METABOLIC PANEL WITH GFR
Anion gap: 9 (ref 5–15)
BUN: 8 mg/dL (ref 8–23)
CO2: 25 mmol/L (ref 22–32)
Calcium: 8.9 mg/dL (ref 8.9–10.3)
Chloride: 103 mmol/L (ref 98–111)
Creatinine, Ser: 0.87 mg/dL (ref 0.61–1.24)
GFR, Estimated: >60 mL/min (ref >60)
Glucose, Bld: 148 mg/dL — ABNORMAL HIGH (ref 70–99)
Potassium: 3.7 mmol/L (ref 3.5–5.1)
Sodium: 137 mmol/L (ref 135–145)

## 2023-09-24 LAB — GLUCOSE, CAPILLARY
Glucose-Capillary: 114 mg/dL — ABNORMAL HIGH (ref 70–99)
Glucose-Capillary: 123 mg/dL — ABNORMAL HIGH (ref 70–99)
Glucose-Capillary: 81 mg/dL (ref 70–99)
Glucose-Capillary: 137 mg/dL — ABNORMAL HIGH (ref 70–99)

## 2023-09-24 MED ORDER — LEVETIRACETAM 500 MG PO TABS
500.0000 mg | ORAL_TABLET | Freq: Two times a day (BID) | ORAL | 0 refills | Status: DC
  Filled 2023-09-24: qty 60, 30d supply, fill #0

## 2023-09-24 MED ORDER — METOPROLOL SUCCINATE ER 50 MG PO TB24
50.0000 mg | ORAL_TABLET | Freq: Every day | ORAL | 0 refills | Status: DC
  Filled 2023-09-24: qty 30, 30d supply, fill #0

## 2023-09-24 MED ORDER — SIMVASTATIN 20 MG PO TABS
20.0000 mg | ORAL_TABLET | Freq: Every day | ORAL | 0 refills | Status: DC
  Filled 2023-09-24: qty 30, 30d supply, fill #0

## 2023-09-24 MED ORDER — IRBESARTAN 150 MG PO TABS
150.0000 mg | ORAL_TABLET | Freq: Every day | ORAL | 0 refills | Status: DC
  Filled 2023-09-24: qty 30, 30d supply, fill #0

## 2023-09-24 MED ORDER — LIDOCAINE-PRILOCAINE 2.5-2.5 % EX CREA
1.0000 | TOPICAL_CREAM | Freq: Once | CUTANEOUS | Status: DC
  Filled 2023-09-24: qty 5

## 2023-09-24 MED ORDER — ZINC SULFATE 220 (50 ZN) MG PO TABS
220.0000 mg | ORAL_TABLET | Freq: Every day | ORAL | 0 refills | Status: DC
  Filled 2023-09-24: qty 30, 30d supply, fill #0

## 2023-09-24 MED ORDER — LORATADINE 10 MG PO TABS
10.0000 mg | ORAL_TABLET | Freq: Every day | ORAL | 0 refills | Status: DC
  Filled 2023-09-24: qty 30, 30d supply, fill #0

## 2023-09-24 MED ORDER — ALBUTEROL SULFATE HFA 108 (90 BASE) MCG/ACT IN AERS
1.0000 | INHALATION_SPRAY | Freq: Four times a day (QID) | RESPIRATORY_TRACT | 0 refills | Status: DC | PRN
  Filled 2023-09-24: qty 6.7, 25d supply, fill #0

## 2023-09-24 MED ORDER — VITAMIN D3 125 MCG (5000 UT) PO TABS
5000.0000 [IU] | ORAL_TABLET | Freq: Every morning | ORAL | 0 refills | Status: DC
  Filled 2023-09-24: qty 30, 30d supply, fill #0

## 2023-09-24 MED ORDER — VITAMIN C 1000 MG PO TABS
1000.0000 mg | ORAL_TABLET | Freq: Two times a day (BID) | ORAL | 0 refills | Status: DC
  Filled 2023-09-24: qty 30, 15d supply, fill #0

## 2023-09-24 MED ORDER — TOPIRAMATE 25 MG PO TABS
25.0000 mg | ORAL_TABLET | Freq: Every day | ORAL | 0 refills | Status: DC
  Filled 2023-09-24: qty 30, 30d supply, fill #0

## 2023-09-24 MED ORDER — PANTOPRAZOLE SODIUM 40 MG PO TBEC
40.0000 mg | DELAYED_RELEASE_TABLET | Freq: Every day | ORAL | 0 refills | Status: DC
  Filled 2023-09-24: qty 30, 30d supply, fill #0

## 2023-09-24 MED ORDER — AMLODIPINE BESYLATE 10 MG PO TABS
10.0000 mg | ORAL_TABLET | Freq: Every day | ORAL | 0 refills | Status: DC
  Filled 2023-09-24: qty 30, 30d supply, fill #0

## 2023-09-24 MED ORDER — BUTALBITAL-APAP-CAFFEINE 50-325-40 MG PO TABS
1.0000 | ORAL_TABLET | ORAL | 0 refills | Status: DC | PRN
  Filled 2023-09-24: qty 14, 3d supply, fill #0

## 2023-09-24 MED ORDER — VITAMIN E 180 MG (400 UNIT) PO CAPS
400.0000 [IU] | ORAL_CAPSULE | Freq: Every morning | ORAL | 0 refills | Status: DC
  Filled 2023-09-24: qty 30, 30d supply, fill #0

## 2023-09-24 MED ORDER — METFORMIN HCL ER 500 MG PO TB24
1000.0000 mg | ORAL_TABLET | Freq: Two times a day (BID) | ORAL | 0 refills | Status: DC
  Filled 2023-09-24: qty 120, 30d supply, fill #0

## 2023-09-24 NOTE — Progress Notes (Signed)
 Occupational Therapy Discharge Summary  Patient Details  Name: Joel Williams MRN: [id number] Date of Birth: [birth year]  Date of Discharge from OT service:September 24, 2023  Today's Date: 09/24/2023 OT Individual Time: 1048-1200 OT Individual Time Calculation (min): 72 min    Patient has met 10 of 10 long term goals due to improved activity tolerance, improved balance, postural control, ability to compensate for deficits, functional use of  LEFT upper and LEFT lower extremity, improved attention, improved awareness, and improved coordination.  Patient to discharge at overall Supervision level.  Patient's care partner is independent to provide the necessary physical and cognitive assistance at discharge. Family education completed with Joel Williams's wife and daughter.   Recommendation:  Patient will benefit from ongoing skilled OT services in outpatient setting to continue to advance functional skills in the area of BADL and iADL.  Equipment: No equipment provided  Reasons for discharge: treatment goals met and discharge from hospital  Patient/family agrees with progress made and goals achieved: Yes  Skilled OT Intervention Pt received supine with no c/o pain, agreeable to OT session. Requested to take shower. Reviewed d/c instructions.  He completed full shower at supervision level. Pt quickly approaching mod I level with sit <> stands and LB dressing. He was provided intermittent safety cueing for remaining seated for LB dressing. He returned to EOB and dressed with mod I. His wife arrived after the shower. He completed 100 ft of functional mobility to the therapy gym with (S). He then completed dual task processing activity- simultaneous squats holding a 6 lb ball while problem solving through daily living and household hypothetical safety situations. He required only (S) to appropriately name 1-2 solutions. Occasional cueing for alternating attention. OTAGO A exercises provided for HEP and  reviewed/teachback. He reported slight dizziness and requested blood sugar check. It was WNL, as was BP. He was left sitting EOB with his family present, all needs met.    OT Discharge Precautions/Restrictions  Precautions Precautions: Fall Precaution/Restrictions Comments: R craniotomy, R HOH; L-sided weakness Restrictions Weight Bearing Restrictions Per Provider Order: No   ADL ADL Eating: Independent Where Assessed-Eating: Edge of bed Grooming: Modified independent Where Assessed-Grooming: Standing at sink Upper Body Bathing: Modified independent Where Assessed-Upper Body Bathing: Shower Lower Body Bathing: Supervision/safety Where Assessed-Lower Body Bathing: Shower Upper Body Dressing: Modified independent (Device) Where Assessed-Upper Body Dressing: Edge of bed Lower Body Dressing: Supervision/safety Where Assessed-Lower Body Dressing: Sitting at sink Toileting: Supervision/safety Where Assessed-Toileting: Teacher, adult education: Distant supervision Statistician Method: Proofreader: Gaffer: Unable to Engineer, technical sales: Close supervision Film/video editor Method: Warden/ranger: Sales promotion account executive Baseline Vision/History: 1 Wears glasses Patient Visual Report: No change from baseline Vision Assessment?: Yes Eye Alignment: Within Functional Limits Ocular Range of Motion: Within Functional Limits Alignment/Gaze Preference: Within Defined Limits Tracking/Visual Pursuits: Able to track stimulus in all quads without difficulty Saccades: Within functional limits Convergence: Within functional limits Additional Comments: Peripheral deficits L>R, no field deficits noted Perception  Perception: Impaired Perception-Other Comments: mild Praxis Praxis: WFL Cognition Cognition Overall Cognitive Status: Impaired/Different from baseline Arousal/Alertness:  Awake/alert Orientation Level: Person;Place;Situation Person: Oriented Place: Oriented Situation: Oriented Memory: Impaired Memory Impairment: Decreased short term memory;Decreased recall of new information Decreased Short Term Memory: Verbal complex;Functional complex Attention: Alternating Alternating Attention Impairment: Verbal complex;Functional complex Awareness: Impaired Awareness Impairment: Emergent impairment Problem Solving: Impaired Problem Solving Impairment: Verbal complex;Functional complex Executive Function: Organizing;Self Monitoring;Self Correcting;Decision Making Organizing: Impaired Organizing Impairment:  Verbal complex;Functional complex Decision Making: Impaired Decision Making Impairment: Functional complex;Verbal complex Self Monitoring: Impaired Self Monitoring Impairment: Verbal complex;Functional complex Self Correcting: Impaired Self Correcting Impairment: Verbal complex;Functional complex Safety/Judgment: Appears intact Brief Interview for Mental Status (BIMS) Repetition of Three Words (First Attempt): 3 Temporal Orientation: Year: Correct Temporal Orientation: Month: Accurate within 5 days Temporal Orientation: Day: Correct BIMS Summary Score: 99 Sensation Sensation Light Touch: Appears Intact Coordination Gross Motor Movements are Fluid and Coordinated: Yes Fine Motor Movements are Fluid and Coordinated: Yes Coordination and Movement Description: Deficits due to generalized weakness and decreased balance strategies; Mild dysmetria in LUE/essential tremors in BUE; Mild L-sided inattention Motor  Motor Motor: Hemiplegia Motor - Discharge Observations: Very mild. Improved from eval' Mobility  Bed Mobility Bed Mobility: Supine to Sit;Sit to Supine Supine to Sit: Independent Sit to Supine: Independent Transfers Sit to Stand: Independent Stand to Sit: Independent  Trunk/Postural Assessment  Cervical Assessment Cervical Assessment: Within  Functional Limits Thoracic Assessment Thoracic Assessment: Within Functional Limits Lumbar Assessment Lumbar Assessment: Within Functional Limits Postural Control Postural Control: Deficits on evaluation Righting Reactions: Decreased/Delayed  Balance Balance Balance Assessed: Yes Static Sitting Balance Static Sitting - Balance Support: Feet supported Static Sitting - Level of Assistance: 7: Independent Dynamic Sitting Balance Dynamic Sitting - Balance Support: During functional activity Dynamic Sitting - Level of Assistance: 6: Modified independent (Device/Increase time) Static Standing Balance Static Standing - Balance Support: No upper extremity supported;During functional activity Static Standing - Level of Assistance: 6: Modified independent (Device/Increase time) Dynamic Standing Balance Dynamic Standing - Balance Support: No upper extremity supported Dynamic Standing - Level of Assistance: 5: Stand by assistance Extremity/Trunk Assessment RUE Assessment RUE Assessment: Within Functional Limits LUE Assessment LUE Assessment: Within Functional Limits   Nena VEAR Moats 09/24/2023, 11:10 AM

## 2023-09-24 NOTE — Plan of Care (Signed)
  Problem: RH Awareness Goal: LTG: Patient will demonstrate awareness during functional activites type of (SLP) Description: LTG: Patient will demonstrate awareness during functional activites type of (SLP) Outcome: Not Met (add Reason) variable awareness remains  Problem: RH Problem Solving Goal: LTG Patient will demonstrate problem solving for (SLP) Description: LTG:  Patient will demonstrate problem solving for basic/complex daily situations with cues  (SLP) Outcome: Completed/Met   Problem: RH Attention Goal: LTG Patient will demonstrate this level of attention during functional activites (SLP) Description: LTG:  Patient will will demonstrate this level of attention during functional activites (SLP) Outcome: Completed/Met

## 2023-09-24 NOTE — Inpatient Diabetes Management (Signed)
 Inpatient Diabetes Program Recommendations  AACE/ADA: New Consensus Statement on Inpatient Glycemic Control (2015)  Target Ranges:  Prepandial:   less than 140 mg/dL      Peak postprandial:   less than 180 mg/dL (1-2 hours)      Critically ill patients:  140 - 180 mg/dL   Lab Results  Component Value Date   GLUCAP 123 (H) 09/24/2023   HGBA1C 7.4 (H) 09/14/2023    Review of Glycemic Control  Spoke with pt, wife and daughter at bedside regarding the need for insulin  pens at home and holing insulin  pump therapy until follow up with Endocrinologist, Dr. Faythe, and scheduling appointment with insulin  pump trainer.  Note: pt did not recognize me from yesterday or the conversation we had.  Daughter has made an appointment with Dr. Faythe on Tuesday July 15 th at 3:30. She is also contacting the insulin  pump trainer to make an appointment for insulin  pump training for her mother, herself, and sister.  Reviewed the different types of insulin  and when to give them. Discussed glucose and A1c goals. Reviewed operation of insulin  pen. Session was with second daughter over Face time.   Discharge Recommendations: Long acting recommendations: Insulin  Glargine (LANTUS ) Solostar Pen 40 units bid  Short acting recommendations:  Meal + Correction coverage Insulin  aspart (NOVOLOG ) FlexPen  Resistant Scale.  5 tid before meals Hypoglycemia treatment recommendations: GVoke 1mg  Supply/Referral recommendations: Pen needles - standard   Use Adult Diabetes Insulin  Treatment Post Discharge order set.  Thanks,  Clotilda Bull RN, MSN, BC-ADM Inpatient Diabetes Coordinator Team Pager [phone] (8a-5p)

## 2023-09-24 NOTE — Progress Notes (Signed)
 PROGRESS NOTE   Subjective/Complaints:  No events overnight.  No complaints Vitals doing well. Last recorded bowel movement 7-4, nursing informed to give sorbitol  today.  Patient uncertain when his last bowel movement was, think it was 2 days ago Sugars well-controlled this a.m. plan to resume insulin  pump at discharge.  ROS: as per HPI. Denies CP, SOB, abd pain, N/V/D, or any other complaints at this time.   +Headaches--ongoing, improving  Objective:   No results found.  No results for input(s): WBC, HGB, HCT, PLT in the last 72 hours.  No results for input(s): NA, K, CL, CO2, GLUCOSE, BUN, CREATININE, CALCIUM in the last 72 hours.       Intake/Output Summary (Last 24 hours) at 09/24/2023 1012 Last data filed at 09/24/2023 0700 Gross per 24 hour  Intake 357 ml  Output --  Net 357 ml         Physical Exam: Vital Signs Blood pressure (!) 147/80, pulse 73, temperature 98.6 F (37 C), temperature source Oral, resp. rate 16, height 5' 9 (1.753 m), weight 99.3 kg, SpO2 98%.   Gen: no distress, appropriate appearance for age laying in bed. HEENT: oral mucosa pink and moist,  HOH R>L Eyes: EOMI.  PERRLA.  No obvious field cuts. Cardio: Reg rate and rhythm, no m/r/g Chest: normal effort, normal rate of breathing, CTAB Abd: soft, non-distended, nonTTP, +BS throughout, normoactive Ext: No peripheral edema. Psych: Appropriate mood and affect. Skin: intact except R crani incision, covered.  Dried blood overlying; no active drainage.  Unchanged 7-11  Neuro:  Awake, alert.  Can follow all simple commands. Oriented to person, place, and time without cues. No apparent cognitive deficits, no obvious hallucinations or delusions.  Strength: 4+ out of 5 left upper and lower extremity, 5 out of 5 right upper and lower extremity. Sensation: Intact in all 4 extremities Coordination: Impaired left upper  extremity finger-to-nose, left lower extremity heel-to-shin. Tone: No spasticity   Assessment/Plan: 1. Functional deficits which require 3+ hours per day of interdisciplinary therapy in a comprehensive inpatient rehab setting. Physiatrist is providing close team supervision and 24 hour management of active medical problems listed below. Physiatrist and rehab team continue to assess barriers to discharge/monitor patient progress toward functional and medical goals  Care Tool:  Bathing    Body parts bathed by patient: Right arm, Left arm, Chest, Abdomen, Front perineal area, Buttocks, Right upper leg, Left upper leg, Right lower leg, Left lower leg, Face         Bathing assist Assist Level: Supervision/Verbal cueing     Upper Body Dressing/Undressing Upper body dressing   What is the patient wearing?: Pull over shirt    Upper body assist Assist Level: Set up assist    Lower Body Dressing/Undressing Lower body dressing      What is the patient wearing?: Underwear/pull up, Pants     Lower body assist Assist for lower body dressing: Supervision/Verbal cueing     Toileting Toileting    Toileting assist Assist for toileting: Supervision/Verbal cueing     Transfers Chair/bed transfer  Transfers assist     Chair/bed transfer assist level: Supervision/Verbal cueing     Locomotion  Ambulation   Ambulation assist      Assist level: Contact Guard/Touching assist Assistive device: Walker-rolling Max distance: 250 ft   Walk 10 feet activity   Assist     Assist level: Contact Guard/Touching assist Assistive device: No Device   Walk 50 feet activity   Assist    Assist level: Contact Guard/Touching assist Assistive device: Walker-rolling    Walk 150 feet activity   Assist    Assist level: Contact Guard/Touching assist Assistive device: Walker-rolling    Walk 10 feet on uneven surface  activity   Assist Walk 10 feet on uneven surfaces  activity did not occur: Safety/medical concerns         Wheelchair     Assist Is the patient using a wheelchair?: No (w/c brought during ambulation in eval but not used; may require during future therapy sessions for fatigue.)             Wheelchair 50 feet with 2 turns activity    Assist            Wheelchair 150 feet activity     Assist          Blood pressure (!) 147/80, pulse 73, temperature 98.6 F (37 C), temperature source Oral, resp. rate 16, height 5' 9 (1.753 m), weight 99.3 kg, SpO2 98%.  Medical Problem List and Plan: 1. 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  Status post right temporal occipital craniotomy for resection of tumor.  Path report high-grade glioma.  Follow-up neuro-oncology outpatient.  Decadron  taper completed             -patient may shower             -ELOS/Goals: 10-14 days - 7/12             -Continue CIR -09/19/23 pt with increased HA and n/v overnight, confusion/change in activity today -reached out to NSG Gerard Beck NP who recommended head CT without contrast-- negative -no MRI for now, per Dr. Mavis -restart decadron  2mg  TID x2 days, ordered -zofran  PRN already ordered -monitor closely for changes, appreciate NSG input 7/7: CT negative--doing much better this a.m. after medication changes.  7/8: spv with OT yesterday. Per PT, cognition limiting but can walk 350 ft with RW. He has trouble figuring out where to hold his arms . Memory improving with SLP but still with difficulty with organization and structuring.--c/b baseline deficits. Not impulsive.  7-9: Informed therapies to monitor for cognitive decline and worsening symptoms with steroid discontinuation; reports that he did very well today.  Monitor closely.  Patient informed to follow-up with neuro-oncology as outpatient for results of biopsy. The patient is medically ready for discharge to home 7/12 and will need follow-up with [facility] PM[facility]. In addition,  they will need to follow up with their PCP, Neurosurgery.   2.  Antithrombotics: -DVT/anticoagulation:  Pharmaceutical: Heparin  5000U q8h initiated 09/16/2023             -antiplatelet therapy: Aspirin  on hold due to craniotomy   3. Pain Management: continue tylenol  and Hydrocodone  as needed-- decreased tylenol  PRN to avoid overdosing since hydrocodone  also has 325mg .  -09/19/23 increased headaches as above, started topamax  and fioricet  as above-- MONITOR TOTAL TYLENOL  INTAKE CLOSELY started Topamax  25mg  daily, for now dosed at dinner time to avoid fatigue during the day, but timing could be adjusted as primary team sees fit  -started Fioricet  1 tab q4h PRN (just need to keep an eye on tylenol  total  dose-- advised nursing of this)  7/7: Using fioricet  consistently w/ norco; symptoms much improved with this and resumption of Decadron ..  Continue current regimen, if staying below Tylenol  limits.--stable  7-9: Has not used Norco since 7-6; will DC.  Increase Tylenol  to 325 to 650 mg every 4 hours as needed for mild to moderate pain.  Using Fioricet  about once daily.  4. Mood/Behavior/Sleep: Provide emotional support             -antipsychotic agents: N/A  -09/18/23 poor sleep but doesn't want meds; monitor for now  7-7: Intermittent confusion concerning for development of delirium; will place precautions, get sleep log.  7/8: no log, but endorses good sleep.  5. Neuropsych/cognition: This patient is not capable of making decisions on his own behalf.   7/8: wife reporting cognitive decline PTA, was saying he was going places and was actually getting lost. Likely close to SPV level at baseline.   6. Skin/Wound Care: Routine skin checks   - Surgery done 7-1; will need dressings and staples removed 7-11--order placed this AM  7. Fluids/Electrolytes/Nutrition: Routine in and outs with follow-up chemistries -Hypokalemia: K 2.9 on admission, given KCL 40mEq x3, recheck Monday.     - 7/7: Nutrition  consulted for poor POs--appreciate assessment  8.  Seizure prophylaxis.  Continue Keppra  500 mg twice daily   9.  Hypertension. continue Avapro  150 mg daily, Norvasc  10 mg nightly, Toprol -XL 50 mg daily.  Monitor with increased mobility  -09/18/23 BPs fine, monitor  -09/19/23 BPs a little up today but could be from pain-- monitor closely.   - BP is looking better with headache control.  Vitals:   09/21/23 0606 09/21/23 1246 09/21/23 1956 09/21/23 2108  BP: (!) 160/81 115/70 (!) 122/58 (!) 102/53   09/22/23 0645 09/22/23 1605 09/22/23 1949 09/22/23 2152  BP: 134/79 130/70 (!) 122/58 (!) 102/49   09/23/23 0459 09/23/23 1430 09/23/23 2035 09/24/23 0606  BP: (!) 136/90 113/60 (!) 123/55 (!) 147/80    10.  Diabetes mellitus.  Patient with insulin  pump PTA as well as Glucophage  1000 mg twice daily and semaglutide .  Currently on NovoLog  5 units 3 times daily, Semglee  40 units twice daily. And SSI -09/18/23 CBGs great, advised that if he brings his freestyle monitor, we can confirm accuracy and use that for CBG checks. Continue current regimen. -09/19/23 CBGs lower today, didn't eat much yesterday. But restarting decadron  today so expect CBGs to go up; monitor  7/7: BG increased due to decadron  - monitor today then increase semglee  as indicated 7/8: Well controlled, stopping decadron  tomorrow so will keep current regimen  7-9: Well-controlled, monitor  7-10: Blood sugars tightly controlled; now off of steroids.  Will DC Premeal insulin , half long-acting to 20 units twice daily, and resume metformin  1000 mg twice daily.  Semaglutide  not on formulary.    7/11: Blood sugars looking good, continue current regimen and patient to resume home regimen on discharge with resumption of insulin  pump CBG (last 3)  Recent Labs    09/23/23 2106 09/23/23 2220 09/24/23 0623  GLUCAP 150* 144* 114*    11.  Hyperlipidemia.  Zocor  20mg  daily  12.  CKD.  Seen by renal services in the past and has since signed off.   Follow-up chemistries   - stable  13.  CAD.  Low-dose aspirin  on hold.  No chest pain or shortness of breath  14.  GERD.  Protonix  40mg  nightly  15.  History of asthma.  Albuterol  inhaler as  needed.  16.  Class I obesity.  BMI 32.33.  Dietary follow-up  17. ABLA: Hgb 12.9 7/4, monitor routine labs  18. Bowel management: -09/19/23 LBM 7/4, exam today with slightly hypoactive BS but abd soft and nontender, having n/v but suspect it's from headaches, have low threshold for abd xray if symptoms persist/worsen; monitor closely, may need bowel meds if no BM by tomorrow, but also not eating well.   7/7: Last BM 7-4, ate poorly yesterday, feeling much better today.  No concerning signs on exam.  7/8:add sennakot s 1 tab BID, daily miralax . Exam benign  7/9: add as needed sorbitol  to use if no bowel movement today--no bowel movement recorded 7-10  7/11: Requested nursing to please give sorbitol  this a.m., if no results soapsuds enema tonight--patient agreeable  19. R ear fullness/HOH. Notable opacification on CT head. - Add claritin  10 mg daily--minimal benefit, can DC at discharge  LOS: 7 days A FACE TO FACE EVALUATION WAS PERFORMED  Joel Williams Likes 09/24/2023, 10:12 AM

## 2023-09-24 NOTE — Progress Notes (Signed)
 Patient ID: OSWELL SAY, male   DOB: Feb 23, 1951, 73 y.o.   MRN: [id number]  SW faxed outpatient PT/OT/SLP referral to [facility] [place] location.   Graeme Jude, MSW, LCSW Office: [phone] Cell: [phone] Fax: [phone]

## 2023-09-24 NOTE — Progress Notes (Signed)
 Inpatient Rehabilitation Discharge Medication Review by a Pharmacist  A complete drug regimen review was completed for this patient to identify any potential clinically significant medication issues.  High Risk Drug Classes Is patient taking? Indication by Medication  Antipsychotic No   Anticoagulant No   Antibiotic No   Opioid Yes Fioricet  - headache  Antiplatelet No   Hypoglycemics/insulin  Yes Insulin  aspart/metformin  - DM   Vasoactive Medication Yes Amlodipine , irbesartan , Toprol  -HTN   Chemotherapy No   Other Yes Miralax /senokot - constipation Topamax  - headache Protonix  - GERD Albuterol - SOB  Keppra - sz ppx  MVI- supplement Simvastatin - HLD  Tylenol  - pain Claritin  - allergies Vitamin C /D/biotin - supplement Zocor  - HLD     Type of Medication Issue Identified Description of Issue Recommendation(s)  Drug Interaction(s) (clinically significant)     Duplicate Therapy     Allergy     No Medication Administration End Date     Incorrect Dose     Additional Drug Therapy Needed     Significant med changes from prior encounter (inform family/care partners about these prior to discharge).    Other       Clinically significant medication issues were identified that warrant physician communication and completion of prescribed/recommended actions by midnight of the next day:  No   Pharmacist comments:   Time spent performing this drug regimen review (minutes):  20  Sergio Batch, PharmD, [place], AAHIVP, CPP Infectious Disease Pharmacist 09/24/2023 8:04 AM

## 2023-09-24 NOTE — Progress Notes (Signed)
 Physical Therapy Session Note  Patient Details  Name: Joel Williams MRN: [id number] Date of Birth: 01-May-1950  Today's Date: 09/24/2023 PT Individual Time: 1300-1415 PT Individual Time Calculation (min): 75 min   Short Term Goals: Week 1:  PT Short Term Goal 1 (Week 1): Pt will perform bed mobility with overall supervision. PT Short Term Goal 2 (Week 1): Pt will perform all transfers with no AD and overall supervision. PT Short Term Goal 3 (Week 1): Pt will ambulate community distances with no AD and minimal path deviations with supervision/ CGA. PT Short Term Goal 4 (Week 1): Pt will safely navigate steps as per home environment with close supervision.  Skilled Therapeutic Interventions/Progress Updates:    pt received in bed and agreeable to therapy. No complaint of pain. Pt participated in d/c assessments as described in d/c summary. Session focused on gait without an AD in a community environment. Pt ambulated down to [facility] entrance with supervision/mod I. Pt with x 2 LOB during session, on level ground with distraction and when stepping down curb, no more than min a required. Pt ambulated over uneven grass, rocks, and tree roots for greater balance challenge with CGA-min a. After this, noted increase in fatigue, including difficulty clearing feet and maintaining knees flexed in stance. Took increased rest breaks and reiterated need for rest breaks and energy conservation for safety. Pt returned to bed after session and was left with all needs in reach and alarm active.   Therapy Documentation Precautions:  Precautions Precautions: Fall Recall of Precautions/Restrictions: Intact Precaution/Restrictions Comments: R craniotomy, R HOH; L-sided weakness Restrictions Weight Bearing Restrictions Per Provider Order: No General:       Therapy/Group: Individual Therapy  Violett Hobbs C Ishaq Maffei 09/24/2023, 4:18 PM

## 2023-09-24 NOTE — Progress Notes (Incomplete)
 Patient restful throughout shift, sleep chart continue results 8/12 hours

## 2023-09-24 NOTE — Progress Notes (Signed)
 Patient was given Miralax  at 0900 and sorbitol  at 1614 with no success of BM, patient has made multiple attempts in the bathroom and they stated they feel like they have to go, patient hydrating frequently as well, nurse  informed patient that the doctor has ordered a soap and suds enema to aid in having a BM, the patient and wife wanted to wait a couple hours to see if the food on top of everything would help with the BM,  Using this time to remove staples, patient very sensitive to the first removal with a jerking and guarded reaction stating pain, called PA and ordered lidocaine  cream, cream arrived at 1832, cream applied, staples removed at 1845

## 2023-09-24 NOTE — Progress Notes (Signed)
 Inpatient Rehabilitation Care Coordinator Assessment and Plan Patient Details  Name: Joel Williams MRN: [id number] Date of Birth: 14-Dec-1950  Today's Date: 09/24/2023  Hospital Problems: Principal Problem:   Glioma [facility])  Past Medical History:  Past Medical History:  Diagnosis Date   Allergic rhinitis    Arthritis    ASTHMA 06/08/2008   Asthma    as Joel child   CAD (coronary artery disease)    Joel. s/p Promus DES (3.0 x 16 mm) to mid LAD 05/16/10;  b. Myoview  05/15/10: anteroseptal ischemia with EF 52%;   c. Cath 05/16/10: single vessel CAD with mLAD 90% tx with PCI and EF 50%;  d. 07/2011 Cath:  Patent stent, nonobs dzs, NL EF.   Chronic kidney disease    was seen by Dr. Lonna and released.   Colonic polyp    DIABETES MELLITUS, TYPE II 06/08/2008   GERD (gastroesophageal reflux disease)    Hemorrhoids    HYPERLIPIDEMIA 06/08/2008   HYPERTENSION 06/08/2008   Impotence of organic origin 08/24/2008   Myocardial infarction (HCC) 04/16/2010   Pneumonia    hx of   Renal vein thrombosis (HCC)    Seasonal allergies    Past Surgical History:  Past Surgical History:  Procedure Laterality Date   APPLICATION OF CRANIAL NAVIGATION Right 09/14/2023   Procedure: COMPUTER-ASSISTED NAVIGATION, FOR CRANIAL PROCEDURE;  Surgeon: Joel Pupa, MD;  Location: [facility] OR;  Service: Neurosurgery;  Laterality: Right;   BACK SURGERY  2011   CATARACT EXTRACTION Bilateral    2024   CORONARY ANGIOPLASTY WITH STENT PLACEMENT     CRANIOTOMY Right 09/14/2023   Procedure: STEREOTACTIC RIGHT TEMPRO-OCCIPITAL CRANIOTOMY FOR RESECTION OF TUMOR;  Surgeon: Joel Pupa, MD;  Location: [facility] OR;  Service: Neurosurgery;  Laterality: Right;   LEFT HEART CATHETERIZATION WITH CORONARY ANGIOGRAM N/Joel 07/29/2011   Procedure: LEFT HEART CATHETERIZATION WITH CORONARY ANGIOGRAM;  Surgeon: Joel JONETTA Cash, MD;  Location: [facility] CATH LAB;  Service: Cardiovascular;  Laterality: N/Joel;   LEFT HEART CATHETERIZATION WITH  CORONARY ANGIOGRAM N/Joel 06/14/2013   Procedure: LEFT HEART CATHETERIZATION WITH CORONARY ANGIOGRAM;  Surgeon: Joel JONETTA Cash, MD;  Location: [facility] CATH LAB;  Service: Cardiovascular;  Laterality: N/Joel;   RADIOLOGY WITH ANESTHESIA N/Joel 06/09/2022   Procedure: MRI WITH ANESTHESIA OF LUMBAR SPINE WITHOUT CONTRAST;  Surgeon: Radiologist, Medication, MD;  Location: [facility] OR;  Service: Radiology;  Laterality: N/Joel;   Social History:  reports that he quit smoking about 21 years ago. His smoking use included cigarettes. He started smoking about 74 years ago. He has Joel 106 pack-year smoking history. He has never used smokeless tobacco. He reports that he does not currently use alcohol. He reports that he does not use drugs.  Family / Support Systems Marital Status: Married How Long?: 54 years Patient Roles: Spouse, Parent Spouse/Significant Other: Joel Williams (wife) Children: 2 daughters Other Supports: Support from children, various family members, and church family/neighbors Anticipated Caregiver: Wife Ability/Limitations of Caregiver: Pt wife will be primary caregiver Caregiver Availability: 24/7 Family Dynamics: Pt lives with his wife.  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 - How often do you need to have someone help you when you read instructions, pamphlets, or other written material from your doctor or pharmacy?: Rarely Writes: Yes Employment Status: Retired Date Retired/Disabled/Unemployed: Retired Marine scientist Issues: Denies Guardian/Conservator: N/Joel   Abuse/Neglect Abuse/Neglect Assessment Can Be Completed: Yes Physical Abuse: Denies Verbal Abuse: Denies Sexual Abuse: Denies Exploitation  of patient/patient's resources: Denies Self-Neglect: Denies  Patient response to: Social Isolation - How often do you feel lonely or isolated from those around you?:  Rarely  Emotional Status Pt's affect, behavior and adjustment status: Pt in good spirits at time of visit Recent Psychosocial Issues: Denies Psychiatric History: Denies Substance Abuse History: Denies  Patient / Family Perceptions, Expectations & Goals Pt/Family understanding of illness & functional limitations: Pt and family have Joel general understanding of care needs Premorbid pt/family roles/activities: Independent Anticipated changes in roles/activities/participation: Supervision with ADLs/IADLs Pt/family expectations/goals: Pt and family would like him to be as independent as possible.  Community Resources [employer]: None Premorbid Home Care/DME Agencies: None Transportation available at discharge: Wife Is the patient able to respond to transportation needs?: Yes In the past 12 months, has lack of transportation kept you from medical appointments or from getting medications?: No In the past 12 months, has lack of transportation kept you from meetings, work, or from getting things needed for daily living?: No Resource referrals recommended: Neuropsychology  Discharge Planning Living Arrangements: Spouse/significant other Support Systems: Spouse/significant other, Children, Other relatives, Friends/neighbors, Church/faith community Type of Residence: Private residence Insurance Resources: Electrical engineer Resources: Restaurant manager, fast food Screen Referred: No Living Expenses: Banker Management: Patient, Spouse Does the patient have any problems obtaining your medications?: No Home Management: Pt wife manages all home care needs; pt cooks on the grill at times Patient/Family Preliminary Plans: No changes Care Coordinator Barriers to Discharge: Pending chemo/radiation Care Coordinator Anticipated Follow Up Needs: HH/OP Expected length of stay: D/c 7/12  Clinical Impression Sw met with pt, wife, dtr and grandaughter in room .Has DME but will need 3in1 Bsc.  SW confirms therapy will allow SW to order item.   Joel Williams 09/24/2023, 12:06 PM

## 2023-09-24 NOTE — Progress Notes (Signed)
 Speech Language Pathology Discharge Summary  Patient Details  Name: Joel Williams MRN: [id number] Date of Birth: 11-16-1950  Date of Discharge from SLP service:September 24, 2023  Today's Date: 09/24/2023 SLP Individual Time: 0900-1000 SLP Individual Time Calculation (min): 60 min   Skilled Therapeutic Interventions: Pt greeted at bedside. He was agreeable to tx tasks targeting cognition. SLP provided pt w/ 3 words to recall and he was able to immediately recall 3/3 words. He then completed word completion task (given category) initiated in prev tx sessions. He was able to complete the task w/ only s cues for alternating attention as well as attention to details. SLP also introduced deductive reasoning task and he benefited from minA cues for reasoning, working memory, and information processing. After ~35 mins, he was able to recall 3/3 words and reported using a mnemonic to assist. SLP then facilitated final education re progress thus far, d/c recs, and additional tasks for pt to complete in the home environment. He verbalized understanding and additional tasks were placed in his binder to take home. At the end of tasks, he was left sitting EOB w/ the alarm set and call light within reach. See d/c summary below for more info re d/c recs.    Patient has met 2 of 3 long term goals.  Patient to discharge at overall Supervision level.  Reasons goals not met: variable success re awareness   Clinical Impression/Discharge Summary:  Good progress noted this admission, as demonstrated by improved memory, attention, and problem solving. For simple cognitive-linguistic tasks, he benefits from supervisionA. MinA required for tasks of increased complexity. He demonstrates emerging success w/ awareness, though unable to meet LTG at this time d/t variability. Pt/family education complete and they are able to assist w/ ADL and IADL completion as needed. He does not require 24/7 supervision, but they will be  available 24/7 as needed. Additionally, ST provided pt w/ tasks to continue in the home environment. Recommend cont ST upon d/c to further target remaining cognitive deficits, maximize pt independence, and facilitate return to prev roles/responsibilities.    Care Partner:  Caregiver Able to Provide Assistance: Yes  Type of Caregiver Assistance: Physical;Cognitive  Recommendation:  Outpatient SLP  Rationale for SLP Follow Up: Maximize cognitive function and independence   Equipment: n/a   Reasons for discharge: Discharged from hospital   Patient/Family Agrees with Progress Made and Goals Achieved: Yes    Recardo DELENA Mole 09/24/2023, 10:43 AM

## 2023-09-25 LAB — GLUCOSE, CAPILLARY: Glucose-Capillary: 163 mg/dL — ABNORMAL HIGH (ref 70–99)

## 2023-09-25 NOTE — Progress Notes (Signed)
 PROGRESS NOTE   Subjective/Complaints:  Pt doing well, ready to go home. Tearful and thankful for the support.  Slept ok, pain well managed, headaches better. LBM today after enema, very large. Urinating fine. No other complaints or concerns.   ROS: as per HPI. Denies CP, SOB, abd pain, N/V/D, or any other complaints at this time.   +Headaches--ongoing, improving  Objective:   No results found.  No results for input(s): WBC, HGB, HCT, PLT in the last 72 hours.  Recent Labs    09/24/23 1039  NA 137  K 3.7  CL 103  CO2 25  GLUCOSE 148*  BUN 8  CREATININE 0.87  CALCIUM 8.9         Intake/Output Summary (Last 24 hours) at 09/25/2023 1026 Last data filed at 09/25/2023 0835 Gross per 24 hour  Intake 477 ml  Output --  Net 477 ml         Physical Exam: Vital Signs Blood pressure (!) 169/80, pulse 72, temperature 97.7 F (36.5 C), temperature source Oral, resp. rate 16, height 5' 9 (1.753 m), weight 99.3 kg, SpO2 99%.   Gen: no distress, appropriate appearance for age laying in bed. HEENT: oral mucosa pink and moist,  HOH R>L Eyes: EOMI.  PERRLA.  No obvious field cuts. Cardio: Reg rate and rhythm, no m/r/g Chest: normal effort, normal rate of breathing, CTAB Abd: soft, non-distended, minimal discomfort in RLQ but not very tender, +BS throughout, normoactive Ext: No peripheral edema. Psych: Appropriate mood and affect.  PRIOR EXAMS: Skin: intact except R crani incision, covered.  Dried blood overlying; no active drainage.  Unchanged 7-11  Neuro:  Awake, alert.  Can follow all simple commands. Oriented to person, place, and time without cues. No apparent cognitive deficits, no obvious hallucinations or delusions.  Strength: 4+ out of 5 left upper and lower extremity, 5 out of 5 right upper and lower extremity. Sensation: Intact in all 4 extremities Coordination: Impaired left upper extremity  finger-to-nose, left lower extremity heel-to-shin. Tone: No spasticity   Assessment/Plan: 1. Functional deficits which require 3+ hours per day of interdisciplinary therapy in a comprehensive inpatient rehab setting. Physiatrist is providing close team supervision and 24 hour management of active medical problems listed below. Physiatrist and rehab team continue to assess barriers to discharge/monitor patient progress toward functional and medical goals    Care Tool:  Bathing    Body parts bathed by patient: Right arm, Left arm, Chest, Abdomen, Front perineal area, Buttocks, Right upper leg, Left upper leg, Right lower leg, Left lower leg, Face         Bathing assist Assist Level: Independent with assistive device     Upper Body Dressing/Undressing Upper body dressing   What is the patient wearing?: Pull over shirt    Upper body assist Assist Level: Independent    Lower Body Dressing/Undressing Lower body dressing      What is the patient wearing?: Underwear/pull up, Pants     Lower body assist Assist for lower body dressing: Independent     Toileting Toileting Toileting Activity did not occur (Clothing management and hygiene only): N/A (no void or bm)  Toileting assist Assist  for toileting: Independent with assistive device     Transfers Chair/bed transfer  Transfers assist     Chair/bed transfer assist level: Independent     Locomotion Ambulation   Ambulation assist      Assist level: Contact Guard/Touching assist Assistive device: No Device Max distance: 500   Walk 10 feet activity   Assist     Assist level: Independent with assistive device Assistive device: No Device   Walk 50 feet activity   Assist    Assist level: Independent with assistive device Assistive device: No Device    Walk 150 feet activity   Assist    Assist level: Independent with assistive device Assistive device: No Device    Walk 10 feet on uneven  surface  activity   Assist Walk 10 feet on uneven surfaces activity did not occur: Safety/medical concerns   Assist level: Contact Guard/Touching assist     Wheelchair     Assist Is the patient using a wheelchair?: No             Wheelchair 50 feet with 2 turns activity    Assist            Wheelchair 150 feet activity     Assist          Blood pressure (!) 169/80, pulse 72, temperature 97.7 F (36.5 C), temperature source Oral, resp. rate 16, height 5' 9 (1.753 m), weight 99.3 kg, SpO2 99%.  Medical Problem List and Plan: 1. 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  Status post right temporal occipital craniotomy for resection of tumor.  Path report high-grade glioma.  Follow-up neuro-oncology outpatient.  Decadron  taper completed             -patient may shower             -ELOS/Goals: 10-14 days - 7/12             -Continue CIR -09/19/23 pt with increased HA and n/v overnight, confusion/change in activity today -reached out to [facility] Gerard Beck NP who recommended head CT without contrast-- negative -no MRI for now, per Dr. Mavis -restart decadron  2mg  TID x2 days, ordered -zofran  PRN already ordered -monitor closely for changes, appreciate NSG input 7/7: CT negative--doing much better this a.m. after medication changes.  7/8: spv with OT yesterday. Per PT, cognition limiting but can walk 350 ft with RW. He has trouble figuring out where to hold his arms . Memory improving with SLP but still with difficulty with organization and structuring.--c/b baseline deficits. Not impulsive.  7-9: Informed therapies to monitor for cognitive decline and worsening symptoms with steroid discontinuation; reports that he did very well today.  Monitor closely.  Patient informed to follow-up with neuro-oncology as outpatient for results of biopsy. The patient is medically ready for discharge to home 7/12 and will need follow-up with [facility] PM[facility]. In addition,  they will need to follow up with their PCP, Neurosurgery.   -09/25/23 d/c instructions reviewed, questions answered.   2.  Antithrombotics: -DVT/anticoagulation:  Pharmaceutical: Heparin  5000U q8h initiated 09/16/2023             -antiplatelet therapy: Aspirin  on hold due to craniotomy   3. Pain Management: continue tylenol  and Hydrocodone  as needed-- decreased tylenol  PRN to avoid overdosing since hydrocodone  also has 325mg .  -09/19/23 increased headaches as above, started topamax  and fioricet  as above-- MONITOR TOTAL TYLENOL  INTAKE CLOSELY started Topamax  25mg  daily, for now dosed at dinner time to avoid fatigue  during the day, but timing could be adjusted as primary team sees fit  -started Fioricet  1 tab q4h PRN (just need to keep an eye on tylenol  total dose-- advised nursing of this)  7/7: Using fioricet  consistently w/ norco; symptoms much improved with this and resumption of Decadron ..  Continue current regimen, if staying below Tylenol  limits.--stable  7-9: Has not used Norco since 7-6; will DC.  Increase Tylenol  to 325 to 650 mg every 4 hours as needed for mild to moderate pain.  Using Fioricet  about once daily.  4. Mood/Behavior/Sleep: Provide emotional support             -antipsychotic agents: N/A  -09/18/23 poor sleep but doesn't want meds; monitor for now 7-7: Intermittent confusion concerning for development of delirium; will place precautions, get sleep log.  7/8: no log, but endorses good sleep.  5. Neuropsych/cognition: This patient is not capable of making decisions on his own behalf.   7/8: wife reporting cognitive decline PTA, was saying he was going places and was actually getting lost. Likely close to SPV level at baseline.   6. Skin/Wound Care: Routine skin checks   - Surgery done 7-1; will need dressings and staples removed 7-11--order placed this AM-- one last staple removed 7/12  7. Fluids/Electrolytes/Nutrition: Routine in and outs with follow-up  chemistries -Hypokalemia: K 2.9 on admission, given KCL 40mEq x3, recheck Monday.     - 7/7: Nutrition consulted for poor POs--appreciate assessment  8.  Seizure prophylaxis.  Continue Keppra  500 mg twice daily   9.  Hypertension. continue Avapro  150 mg daily, Norvasc  10 mg nightly, Toprol -XL 50 mg daily.  Monitor with increased mobility  -09/18/23 BPs fine, monitor  -09/19/23 BPs a little up today but could be from pain-- monitor closely.   - BP is looking better with headache control.  -09/25/23 BPs look good, f/up outpatient  Vitals:   09/21/23 1956 09/21/23 2108 09/22/23 0645 09/22/23 1605  BP: (!) 122/58 (!) 102/53 134/79 130/70   09/22/23 1949 09/22/23 2152 09/23/23 0459 09/23/23 1430  BP: (!) 122/58 (!) 102/49 (!) 136/90 113/60   09/23/23 2035 09/24/23 0606 09/24/23 2037 09/25/23 0303  BP: (!) 123/55 (!) 147/80 135/69 (!) 169/80    10.  Diabetes mellitus.  Patient with insulin  pump PTA as well as Glucophage  1000 mg twice daily and semaglutide .  Currently on NovoLog  5 units 3 times daily, Semglee  40 units twice daily. And SSI -09/18/23 CBGs great, advised that if he brings his freestyle monitor, we can confirm accuracy and use that for CBG checks. Continue current regimen. -09/19/23 CBGs lower today, didn't eat much yesterday. But restarting decadron  today so expect CBGs to go up; monitor  7/7: BG increased due to decadron  - monitor today then increase semglee  as indicated 7/8: Well controlled, stopping decadron  tomorrow so will keep current regimen  7-9: Well-controlled, monitor  7-10: Blood sugars tightly controlled; now off of steroids.  Will DC Premeal insulin , half long-acting to 20 units twice daily, and resume metformin  1000 mg twice daily.  Semaglutide  not on formulary.    7/11-12: Blood sugars looking good, continue current regimen and patient to resume home regimen on discharge with resumption of insulin  pump  CBG (last 3)  Recent Labs    09/24/23 1622 09/24/23 2054  09/25/23 0541  GLUCAP 81 137* 163*    11.  Hyperlipidemia.  Zocor  20mg  daily  12.  CKD.  Seen by renal services in the past and has since signed off.  Follow-up chemistries   - stable  13.  CAD.  Low-dose aspirin  on hold.  No chest pain or shortness of breath  14.  GERD.  Protonix  40mg  nightly  15.  History of asthma.  Albuterol  inhaler as needed.  16.  Class I obesity.  BMI 32.33.  Dietary follow-up  17. ABLA: Hgb 12.9 7/4, monitor routine labs  18. Bowel management: -09/19/23 LBM 7/4, exam today with slightly hypoactive BS but abd soft and nontender, having n/v but suspect it's from headaches, have low threshold for abd xray if symptoms persist/worsen; monitor closely, may need bowel meds if no BM by tomorrow, but also not eating well.   7/7: Last BM 7-4, ate poorly yesterday, feeling much better today.  No concerning signs on exam.  7/8:add sennakot s 1 tab BID, daily miralax . Exam benign  7/9: add as needed sorbitol  to use if no bowel movement today--no bowel movement recorded 7-10  7/11: Requested nursing to please give sorbitol  this a.m., if no results soapsuds enema tonight--patient agreeable -09/25/23 BM this morning with enema  19. R ear fullness/HOH. Notable opacification on CT head. - Add claritin  10 mg daily--minimal benefit, can DC at discharge  LOS: 8 days A FACE TO FACE EVALUATION WAS PERFORMED  [address] 09/25/2023, 10:26 AM

## 2023-09-27 ENCOUNTER — Telehealth: Payer: Self-pay | Admitting: *Deleted

## 2023-09-27 NOTE — Progress Notes (Signed)
 Inpatient Rehabilitation Care Coordinator Discharge Note   Patient Details  Name: Joel Williams MRN: [id number] Date of Birth: Oct 19, 1950   Discharge location: D/c to home with his wife and support from various family members  Length of Stay: 7 days  Discharge activity level: Supervision  Home/community participation: Limited  Patient response un:Yzjouy Literacy - How often do you need to have someone help you when you read instructions, pamphlets, or other written material from your doctor or pharmacy?: Rarely  Patient response un:Dnrpjo Isolation - How often do you feel lonely or isolated from those around you?: Never  Services provided included: MD, RD, PT, OT, SLP, RN, CM, Pharmacy, Neuropsych, SW, TR  Financial Services:  Financial Services Utilized: Medicare    Choices offered to/list presented to: patient and family  Follow-up services arranged:  Outpatient, DME    Outpatient Servicies: [facility] for PT/OT/SLP DME : [facility]    Patient response to transportation need: Is the patient able to respond to transportation needs?: Yes In the past 12 months, has lack of transportation kept you from medical appointments or from getting medications?: No In the past 12 months, has lack of transportation kept you from meetings, work, or from getting things needed for daily living?: No   Patient/Family verbalized understanding of follow-up arrangements:  Yes  Individual responsible for coordination of the follow-up plan: contact pt  wife  Confirmed correct DME delivered: Graeme DELENA Jude 09/27/2023    Comments (or additional information):fam edu completed  Summary of Stay    Date/Time Discharge Planning CSW  09/21/23 1002 Pt will d/c to home with his wife as primary caregiver. PRN support from children and extended family. SW will confirm there are no barriers to discharge. AAC       Terilynn Buresh A Jude

## 2023-09-27 NOTE — Telephone Encounter (Signed)
 Transition Care Management Unsuccessful Follow-up Telephone Call  Date of discharge and from where:  [facility] INPT [facility] 09/25/23  Attempts:  1st Attempt  Reason for unsuccessful TCM follow-up call:  Left voice message

## 2023-09-28 DIAGNOSIS — E119 Type 2 diabetes mellitus without complications: Secondary | ICD-10-CM | POA: Diagnosis not present

## 2023-09-28 DIAGNOSIS — Z794 Long term (current) use of insulin: Secondary | ICD-10-CM | POA: Diagnosis not present

## 2023-10-04 ENCOUNTER — Telehealth: Payer: Self-pay | Admitting: Cardiovascular Disease

## 2023-10-04 NOTE — Telephone Encounter (Signed)
 Wife said pt wants to cx stress test but wants to know if he needs to keep Echo Appt. Please Advise

## 2023-10-04 NOTE — Telephone Encounter (Signed)
 Called patient's wife (DPR) back about message. She stated patient has been diagnosis with a brain tumor and wants to cancel his stress test. Patient also wants to know if having the echo is necessary. Will send message to provider and his nurse for advisement.

## 2023-10-05 NOTE — Therapy (Signed)
 OUTPATIENT PHYSICAL THERAPY NEURO EVALUATION   Patient Name: Joel Williams MRN: [id number] DOB:1951/01/31, 73 y.o., male Today's Date: 10/06/2023   PCP: [name], MD  REFERRING PROVIDER: Pegge Toribio PARAS, PA-C  END OF SESSION:  PT End of Session - 10/06/23 0933     Visit Number 1    Number of Visits 17    Date for PT Re-Evaluation 12/01/23    Authorization Type Medicare/MoO    PT Start Time 0845    PT Stop Time 0930    PT Time Calculation (min) 45 min    Equipment Utilized During Treatment Gait belt    Activity Tolerance Patient tolerated treatment well    Behavior During Therapy WFL for tasks assessed/performed          Past Medical History:  Diagnosis Date   Allergic rhinitis    Arthritis    ASTHMA 06/08/2008   Asthma    as a child   CAD (coronary artery disease)    a. s/p Promus DES (3.0 x 16 mm) to mid LAD 05/16/10;  b. Myoview  05/15/10: anteroseptal ischemia with EF 52%;   c. Cath 05/16/10: single vessel CAD with mLAD 90% tx with PCI and EF 50%;  d. 07/2011 Cath:  Patent stent, nonobs dzs, NL EF.   Chronic kidney disease    was seen by Dr. Lonna and released.   Colonic polyp    DIABETES MELLITUS, TYPE II 06/08/2008   GERD (gastroesophageal reflux disease)    Hemorrhoids    HYPERLIPIDEMIA 06/08/2008   HYPERTENSION 06/08/2008   Impotence of organic origin 08/24/2008   Myocardial infarction (HCC) 04/16/2010   Pneumonia    hx of   Renal vein thrombosis (HCC)    Seasonal allergies    Past Surgical History:  Procedure Laterality Date   APPLICATION OF CRANIAL NAVIGATION Right 09/14/2023   Procedure: COMPUTER-ASSISTED NAVIGATION, FOR CRANIAL PROCEDURE;  Surgeon: Lanis Pupa, MD;  [facility]: [facility];  Service: Neurosurgery;  Laterality: Right;   BACK SURGERY  2011   CATARACT EXTRACTION Bilateral    2024   CORONARY ANGIOPLASTY WITH STENT PLACEMENT     CRANIOTOMY Right 09/14/2023   Procedure: STEREOTACTIC RIGHT TEMPRO-OCCIPITAL CRANIOTOMY FOR  RESECTION OF TUMOR;  Surgeon: Lanis Pupa, MD;  Location: [facility] OR;  Service: Neurosurgery;  Laterality: Right;   LEFT HEART CATHETERIZATION WITH CORONARY ANGIOGRAM N/A 07/29/2011   Procedure: LEFT HEART CATHETERIZATION WITH CORONARY ANGIOGRAM;  Surgeon: Lonni JONETTA Cash, MD;  Location: [facility] CATH LAB;  Service: Cardiovascular;  Laterality: N/A;   LEFT HEART CATHETERIZATION WITH CORONARY ANGIOGRAM N/A 06/14/2013   Procedure: LEFT HEART CATHETERIZATION WITH CORONARY ANGIOGRAM;  Surgeon: Lonni JONETTA Cash, MD;  Location: [facility] CATH LAB;  Service: Cardiovascular;  Laterality: N/A;   RADIOLOGY WITH ANESTHESIA N/A 06/09/2022   Procedure: MRI WITH ANESTHESIA OF LUMBAR SPINE WITHOUT CONTRAST;  Surgeon: Radiologist, Medication, MD;  Location: [facility] OR;  Service: Radiology;  Laterality: N/A;   Patient Active Problem List   Diagnosis Date Noted   Glioma (HCC) 09/17/2023   Status post craniotomy 09/14/2023   Brain tumor (HCC) 09/14/2023   Headache 08/23/2023   Lower urinary tract symptoms (LUTS) 08/19/2023   Obesity with body mass index (BMI) of 30.0 to 39.9 07/08/2017   Chronic diastolic CHF (congestive heart failure) (HCC) 07/07/2017   CAD (coronary artery disease)    Unintentional weight loss 08/23/2008   History of colonic polyps 08/23/2008   Diabetes mellitus without complication (HCC) 06/08/2008   Hyperlipidemia 06/08/2008   Essential hypertension  06/08/2008   Asthma 06/08/2008    ONSET DATE: 09/14/23  REFERRING DIAG: D49.6 (ICD-10-CM) - Brain tumor (HCC)  THERAPY DIAG:  Unsteadiness on feet  Other abnormalities of gait and mobility  Rationale for Evaluation and Treatment: Rehabilitation  SUBJECTIVE:                                                                                                                                                                                             SUBJECTIVE STATEMENT: Patient confirms R sided brain surgery earlier this month. Participated  with therapy while in the hospital but did not have therapy at home. Reports trouble focusing- particularly with bible study and reading small print. Patient reports some perceived R hearing loss before the surgery. Reports HA's and unsteadiness since surgery but denies dizziness or diplopia. Using rollator when outside; was not using AD at [facility]. Reports some visual disturbances since before DC- feeling like an image is stuck in his visual field that is no longer there.     Pt accompanied by: self  PERTINENT HISTORY: Asthma, CAD, CKD, DMII, HLD, HTN, MI 2012, back surgery 2011  PAIN:  Are you having pain? Yes: NPRS scale: 2/10 Pain location: top of head Pain description: sharp, electric Aggravating factors: in the AM Relieving factors: tylenol   PRECAUTIONS: Fall  RED FLAGS: None   WEIGHT BEARING RESTRICTIONS: No  FALLS: Has patient fallen in last 6 months? No  LIVING ENVIRONMENT: Lives with: lives with their spouse Lives in: House/apartment Stairs: 4 steps to enter with handraill 1 story home Has following equipment at home: Environmental consultant - 4 wheeled, Tour manager, and Grab bars  PLOF: Independent, Vocation/Vocational requirements: retired, and Leisure: yardwork  PATIENT GOALS: improve imbalance   OBJECTIVE:  Note: Objective measures were completed at Evaluation unless otherwise noted.  DIAGNOSTIC FINDINGS:  09/19/23 head CT: Recent resection of Right temporal lobe mass, stable from postoperative MRI.  COGNITION: Overall cognitive status: Within functional limits for tasks assessed   SENSATION: Pt denies N/T in UEs/LEs  COORDINATION: Alternating pronation/supination: B mild dysdiadochokinesia Alternating toe tap: B WNL Finger to nose: B WNL   MUSCLE TONE: B WNL  POSTURE: rounded shoulders and forward head  LOWER EXTREMITY ROM:     Active  Right Eval Left Eval  Hip flexion    Hip extension    Hip abduction    Hip adduction    Hip internal rotation    Hip  external rotation    Knee flexion    Knee extension    Ankle dorsiflexion -4 -4  Ankle plantarflexion    Ankle inversion    Ankle eversion     (  Blank rows = not tested)  LOWER EXTREMITY MMT:    MMT (in sitting) Right Eval Left Eval  Hip flexion 4 4+  Hip extension    Hip abduction 4+ 4  Hip adduction 4+ 4+  Hip internal rotation    Hip external rotation    Knee flexion 4+ 4  Knee extension 4+ 4+  Ankle dorsiflexion 4+ 4+  Ankle plantarflexion 4+ 4+  Ankle inversion    Ankle eversion    (Blank rows = not tested)  GAIT: Findings: Assistive device utilized:None, Level of assistance: Modified independence, and Comments: slowed and cautious gait with decreased B step length  FUNCTIONAL TESTS:   [facility] PT Assessment - 10/06/23 0001       Standardized Balance Assessment   Standardized Balance Assessment 10 meter walk test;Five Times Sit to Stand    Five times sit to stand comments  24.65 sec without UEs    10 Meter Walk 13.02 sec without AD   2.52 ft/sec     Functional Gait  Assessment   Gait Level Surface Walks 20 ft in less than 7 sec but greater than 5.5 sec, uses assistive device, slower speed, mild gait deviations, or deviates 6-10 in outside of the 12 in walkway width.    Change in Gait Speed Able to change speed, demonstrates mild gait deviations, deviates 6-10 in outside of the 12 in walkway width, or no gait deviations, unable to achieve a major change in velocity, or uses a change in velocity, or uses an assistive device.    Gait with Horizontal Head Turns Performs head turns with moderate changes in gait velocity, slows down, deviates 10-15 in outside 12 in walkway width but recovers, can continue to walk.    Gait with Vertical Head Turns Performs task with slight change in gait velocity (eg, minor disruption to smooth gait path), deviates 6 - 10 in outside 12 in walkway width or uses assistive device    Gait and Pivot Turn Turns slowly, requires verbal cueing, or  requires several small steps to catch balance following turn and stop    Step Over Obstacle Is able to step over one shoe box (4.5 in total height) but must slow down and adjust steps to clear box safely. May require verbal cueing.    Gait with Narrow Base of Support Ambulates less than 4 steps heel to toe or cannot perform without assistance.    Gait with Eyes Closed Walks 20 ft, slow speed, abnormal gait pattern, evidence for imbalance, deviates 10-15 in outside 12 in walkway width. Requires more than 9 sec to ambulate 20 ft.    Ambulating Backwards Walks 20 ft, uses assistive device, slower speed, mild gait deviations, deviates 6-10 in outside 12 in walkway width.    Steps Alternating feet, must use rail.    Total Score 14           M-CTSIB  Condition 1: Firm Surface, EO 30 Sec, Normal Sway  Condition 2: Firm Surface, EC 30 Sec, Mild Sway  Condition 3: Foam Surface, EO 30 Sec, Mild Sway  Condition 4: Foam Surface, EC 15 Sec, Severe and R LOB Sway  TREATMENT DATE: 10/06/23    PATIENT EDUCATION: Education details: prognosis, POC, exam findings, pt's scheduling concerns  Person educated: Patient Education method: Explanation Education comprehension: verbalized understanding  HOME EXERCISE PROGRAM: Not initiated yet     GOALS: Goals reviewed with patient? Yes  SHORT TERM GOALS: Target date: 11/03/2023  Patient to be independent with initial HEP. Baseline: HEP initiated Goal status: INITIAL    LONG TERM GOALS: Target date: 12/01/2023  Patient to be independent with advanced HEP. Baseline: Not yet initiated  Goal status: INITIAL  Patient to score at least 23/30 on FGA in order to decrease risk of falls.  Baseline: 14 Goal status: INITIAL  Patient to demonstrate gait speed of at least 2.8 ft/sec in order to improve access to community.     Baseline: 2.5 ft/sec Goal status: INITIAL  Patient to demonstrate 5xSTS test in <15 sec in order to decrease risk of falls.  Baseline: 24.65 sec Goal status: INITIAL  Patient to demonstrate balance for 30 sec on MCTSIB condition #4 in order to improve stability in dim lighting.  Baseline: 15 sec Goal status: INITIAL  Patient to return to safely ambulating on outdoor surfaces with LRAD.   Baseline: reports currently using 4WW outside and cannot perform yardwork Goal status: INITIAL    ASSESSMENT:  CLINICAL IMPRESSION:  Patient is a 73 y/o M presenting to OPPT with c/o imbalance s/p R temporo-occipital craniotomy for resection of large R temporal tumor on 09/14/23 followed by CIR 09/18/23-09/24/23. Patient today presenting with decreased UE coordination, limited B ankle dorsiflexion AROM, minor hip/HS weakness, gait deviations, decreased gait and transfer speed, and imbalance. Patient's score on FGA indicates an increased risk of falls. Prior to current episode, patient was independent. Would benefit from skilled PT services 2 x/week for 8 weeks to address aforementioned impairments in order to optimize level of function.    OBJECTIVE IMPAIRMENTS: Abnormal gait, decreased activity tolerance, decreased balance, decreased coordination, decreased endurance, decreased ROM, decreased strength, impaired flexibility, postural dysfunction, and pain.   ACTIVITY LIMITATIONS: carrying, lifting, bending, standing, squatting, stairs, transfers, bathing, dressing, reach over head, hygiene/grooming, and locomotion level  PARTICIPATION LIMITATIONS: meal prep, cleaning, laundry, driving, shopping, community activity, yard work, and church  PERSONAL FACTORS: Age, Fitness, Past/current experiences, Time since onset of injury/illness/exacerbation, and 3+ comorbidities: Asthma, CAD, CKD, DMII, HLD, HTN, MI 2012, back surgery 2011 are also affecting patient's functional outcome.   REHAB POTENTIAL:  Good  CLINICAL DECISION MAKING: Evolving/moderate complexity  EVALUATION COMPLEXITY: Moderate  PLAN:  PT FREQUENCY: 2x/week  PT DURATION: 8 weeks  PLANNED INTERVENTIONS: 97164- PT Re-evaluation, 97750- Physical Performance Testing, 97110-Therapeutic exercises, 97530- Therapeutic activity, [id number]- Neuromuscular re-education, 97535- Self Care, [id number]- Manual therapy, [id number]- Gait training, [phone]- Orthotic Initial, [phone]- Canalith repositioning, [phone] (1-2 muscles), 20561 (3+ muscles)- Dry Needling, Patient/Family education, Balance training, Stair training, Taping, Vestibular training, Cryotherapy, and Moist heat  PLAN FOR NEXT SESSION: initiate HEP for multisensory balance, narrow BOS, EC, stepping strategy and speed of movements     Louana Terrilyn Christians, [place], DPT 10/06/23 9:59 AM  [facility] at [facility] [address] [place], [place] [address] Phone # [phone] Fax # [phone]

## 2023-10-05 NOTE — Telephone Encounter (Signed)
 Called and spoke w patient's wife.  Adv the stress test and echo were to look for cardiac reasons for patient's dizziness and fatigue.  After that he was found to have a brain tumor.   He has had surgery and rehab and is now back home.  He will be starting radiation soon.  I assured her okay to cancel cardiac testing for now as he will be busy and having care from other providers.  Adv if anything is needed or any concerns she should call and we will assist if possible.  She is appreciative for the call today.

## 2023-10-05 NOTE — Telephone Encounter (Deleted)
Transitional Care call--who you talked with    Are you/is patient experiencing any problems since coming home? Are there any questions regarding any aspect of care? Are there any questions regarding medications administration/dosing? Are meds being taken as prescribed? Patient should review meds with caller to confirm Have there been any falls? Has Home Health been to the house and/or have they contacted you? If not, have you tried to contact them? Can we help you contact them? Are bowels and bladder emptying properly? Are there any unexpected incontinence issues? If applicable, is patient following bowel/bladder programs? Any fevers, problems with breathing, unexpected pain? Are there any skin problems or new areas of breakdown? Has the patient/family member arranged specialty MD follow up (ie cardiology/neurology/renal/surgical/etc)?  Can we help arrange? Does the patient need any other services or support that we can help arrange? Are caregivers following through as expected in assisting the patient? Has the patient quit smoking, drinking alcohol, or using drugs as recommended?  Appointment time, arrive time and who it is with here [address] [phone]

## 2023-10-05 NOTE — Telephone Encounter (Signed)
 Transition Care Management Unsuccessful Follow-up Telephone Call  Date of discharge and from where:  inpt rehab [facility] hospital.d/c 09/25/23  Attempts:  2nd Attempt  Reason for unsuccessful TCM follow-up call:  Left voice message to call our office and reminder of appt 10/06/23 @2 :20 with Fidela Ned NP.

## 2023-10-06 ENCOUNTER — Other Ambulatory Visit: Payer: Self-pay

## 2023-10-06 ENCOUNTER — Ambulatory Visit: Admitting: Speech Pathology

## 2023-10-06 ENCOUNTER — Encounter: Payer: Self-pay | Admitting: Physical Therapy

## 2023-10-06 ENCOUNTER — Encounter: Attending: Registered Nurse | Admitting: Registered Nurse

## 2023-10-06 ENCOUNTER — Ambulatory Visit: Attending: Physician Assistant | Admitting: Physical Therapy

## 2023-10-06 ENCOUNTER — Encounter: Payer: Self-pay | Admitting: Speech Pathology

## 2023-10-06 ENCOUNTER — Encounter: Payer: Self-pay | Admitting: Registered Nurse

## 2023-10-06 VITALS — BP 100/59 | HR 74 | Ht 69.0 in | Wt 216.6 lb

## 2023-10-06 DIAGNOSIS — R41841 Cognitive communication deficit: Secondary | ICD-10-CM

## 2023-10-06 DIAGNOSIS — C719 Malignant neoplasm of brain, unspecified: Secondary | ICD-10-CM | POA: Insufficient documentation

## 2023-10-06 DIAGNOSIS — M6281 Muscle weakness (generalized): Secondary | ICD-10-CM | POA: Insufficient documentation

## 2023-10-06 DIAGNOSIS — R41842 Visuospatial deficit: Secondary | ICD-10-CM | POA: Diagnosis not present

## 2023-10-06 DIAGNOSIS — D496 Neoplasm of unspecified behavior of brain: Secondary | ICD-10-CM | POA: Insufficient documentation

## 2023-10-06 DIAGNOSIS — I69818 Other symptoms and signs involving cognitive functions following other cerebrovascular disease: Secondary | ICD-10-CM | POA: Insufficient documentation

## 2023-10-06 DIAGNOSIS — I10 Essential (primary) hypertension: Secondary | ICD-10-CM | POA: Insufficient documentation

## 2023-10-06 DIAGNOSIS — R2681 Unsteadiness on feet: Secondary | ICD-10-CM | POA: Diagnosis not present

## 2023-10-06 DIAGNOSIS — R2689 Other abnormalities of gait and mobility: Secondary | ICD-10-CM | POA: Diagnosis not present

## 2023-10-06 MED ORDER — TOPIRAMATE 25 MG PO TABS: 25.0000 mg | ORAL_TABLET | Freq: Two times a day (BID) | ORAL | 1 refills | Status: DC

## 2023-10-06 NOTE — Progress Notes (Unsigned)
 cpr  Subjective:    Patient ID: Joel Williams, male    DOB: 02-Jun-1950, 73 y.o.   MRN: [id number]  HPI: Joel Williams is a 73 y.o. male who is here or HFU appointment for F/U of his  Glioma and Essential Hypertension. He presented to [facility] urgently  after being seen in the outpatient Neuro- surgery.   Dr. Tempie H&P Note: 09/14/2023 Joel Williams is a 73 y.o. male initially seen urgently in the outpatient neurosurgery clinic after outpatient MRI obtained for constitutional symptoms including unintentional weight loss, generalized fatigue, and dizziness revealed a large peripherally enhancing temporal lesion.  The patient's case was discussed at the multidisciplinary neuro-oncology conference after systemic imaging did not reveal any evidence of primary malignancy.  Consensus opinion was to proceed with stereotactic craniotomy for resection of tumor.  He underwent : on 09/14/2023, Dr. Lanis      STEREOTACTIC RIGHT TEMPRO-OCCIPITAL CRANIOTOMY FOR RESECTION OF TUMOR Right General  COMPUTER-ASSISTED NAVIGATION, FOR CRANIAL PROCEDURE     Joel Williams was admitted to inpatient rehabilitation on 09/17/2023 and was discharged home on 09/25/2023. He reports increase frequency of headaches since Sunday, he was taking his Topamax  and Fiorcet daily. He rates his pain 5.   This was discussed with Dr Emeline she agrees with plan to increase Topamax  BID, Mrs Williams was called regarding the above, she verbalizes understanding. He has a scheduled appointment with Dr Buckley.   Pain Inventory Average Pain 5 Pain Right Now 5 My pain is intermittent and sharp  LOCATION OF PAIN  Head  BOWEL Number of stools per week: 4-5 Oral laxative use No  Type of laxative N/A Enema or suppository use No  History of colostomy No  Incontinent No   BLADDER Normal In and out cath, frequency N/A Able to self cath N/A Bladder incontinence No  Frequent urination No  Leakage with coughing No   Difficulty starting stream No  Incomplete bladder emptying No    Mobility walk without assistance ability to climb steps?  yes do you drive?  no transfers alone  Function retired  Neuro/Psych No problems in this area  Prior Studies Any changes since last visit?  no  Physicians involved in your care PCP:  Dr. Jerrell, PCP Neurologist Oncologist Dr. Buckley   Family History  Problem Relation Age of Onset   Diabetes Father    Heart disease Father    Angina Father    Dementia Mother    Social History   Socioeconomic History   Marital status: Married    Spouse name: Not on file   Number of children: 2   Years of education: Not on file   Highest education level: Not on file  Occupational History   Occupation: Sales  Tobacco Use   Smoking status: Former    Current packs/day: 0.00    Average packs/day: 2.0 packs/day for 53.0 years (106.0 ttl pk-yrs)    Types: Cigarettes    Start date: 05/22/1950    Quit date: 05/23/2002    Years since quitting: 21.3   Smokeless tobacco: Never  Vaping Use   Vaping status: Never Used  Substance and Sexual Activity   Alcohol use: Not Currently    Comment: Ocassional since starting Ozempic - fall 2023.   Drug use: No   Sexual activity: Not Currently  Other Topics Concern   Not on file  Social History Narrative   Not on file   Social Drivers of Health   Financial Resource Strain: Low  Risk  (12/25/2022)   Overall Financial Resource Strain (CARDIA)    Difficulty of Paying Living Expenses: Not hard at all  Food Insecurity: No Food Insecurity (09/14/2023)   Hunger Vital Sign    Worried About Running Out of Food in the Last Year: Never true    Ran Out of Food in the Last Year: Never true  Transportation Needs: No Transportation Needs (09/14/2023)   PRAPARE - Administrator, Civil Service (Medical): No    Lack of Transportation (Non-Medical): No  Physical Activity: Insufficiently Active (12/25/2022)   Exercise Vital Sign     Days of Exercise per Week: 2 days    Minutes of Exercise per Session: 30 min  Stress: No Stress Concern Present (12/25/2022)   [employer] of Occupational Health - Occupational Stress Questionnaire    Feeling of Stress : Not at all  Social Connections: Socially Integrated (09/14/2023)   Social Connection and Isolation Panel    Frequency of Communication with Friends and Family: More than three times a week    Frequency of Social Gatherings with Friends and Family: More than three times a week    Attends Religious Services: More than 4 times per year    Active Member of [employer] or Organizations: Yes    Attends Engineer, structural: More than 4 times per year    Marital Status: Married   Past Surgical History:  Procedure Laterality Date   APPLICATION OF CRANIAL NAVIGATION Right 09/14/2023   Procedure: COMPUTER-ASSISTED NAVIGATION, FOR CRANIAL PROCEDURE;  Surgeon: Lanis Pupa, MD;  Location: [facility] OR;  Service: Neurosurgery;  Laterality: Right;   BACK SURGERY  2011   CATARACT EXTRACTION Bilateral    2024   CORONARY ANGIOPLASTY WITH STENT PLACEMENT     CRANIOTOMY Right 09/14/2023   Procedure: STEREOTACTIC RIGHT TEMPRO-OCCIPITAL CRANIOTOMY FOR RESECTION OF TUMOR;  Surgeon: Lanis Pupa, MD;  Location: [facility] OR;  Service: Neurosurgery;  Laterality: Right;   LEFT HEART CATHETERIZATION WITH CORONARY ANGIOGRAM N/A 07/29/2011   Procedure: LEFT HEART CATHETERIZATION WITH CORONARY ANGIOGRAM;  Surgeon: Lonni JONETTA Cash, MD;  Location: [facility] CATH LAB;  Service: Cardiovascular;  Laterality: N/A;   LEFT HEART CATHETERIZATION WITH CORONARY ANGIOGRAM N/A 06/14/2013   Procedure: LEFT HEART CATHETERIZATION WITH CORONARY ANGIOGRAM;  Surgeon: Lonni JONETTA Cash, MD;  Location: [facility] CATH LAB;  Service: Cardiovascular;  Laterality: N/A;   RADIOLOGY WITH ANESTHESIA N/A 06/09/2022   Procedure: MRI WITH ANESTHESIA OF LUMBAR SPINE WITHOUT CONTRAST;  Surgeon: Radiologist, Medication, MD;   Location: [facility] OR;  Service: Radiology;  Laterality: N/A;   Past Medical History:  Diagnosis Date   Allergic rhinitis    Arthritis    ASTHMA 06/08/2008   Asthma    as a child   CAD (coronary artery disease)    a. s/p Promus DES (3.0 x 16 mm) to mid LAD 05/16/10;  b. Myoview  05/15/10: anteroseptal ischemia with EF 52%;   c. Cath 05/16/10: single vessel CAD with mLAD 90% tx with PCI and EF 50%;  d. 07/2011 Cath:  Patent stent, nonobs dzs, NL EF.   Chronic kidney disease    was seen by Dr. Lonna and released.   Colonic polyp    DIABETES MELLITUS, TYPE II 06/08/2008   GERD (gastroesophageal reflux disease)    Hemorrhoids    HYPERLIPIDEMIA 06/08/2008   HYPERTENSION 06/08/2008   Impotence of organic origin 08/24/2008   Myocardial infarction (HCC) 04/16/2010   Pneumonia    hx of   Renal vein thrombosis (HCC)  Seasonal allergies    BP (!) 100/59 (BP Location: Left Arm, Patient Position: Sitting, Cuff Size: Normal)   Pulse 74   Ht 5' 9 (1.753 m)   Wt 216 lb 9.6 oz (98.2 kg)   SpO2 92%   BMI 31.99 kg/m   Opioid Risk Score:   Fall Risk Score:  `1  Depression screen [facility] 2/9     08/19/2023    8:37 AM 12/25/2022    8:25 AM 12/22/2021    8:33 AM 12/17/2020    8:37 AM 12/17/2020    8:34 AM 11/29/2019    2:31 PM 07/06/2017    8:49 AM  Depression screen PHQ 2/9  Decreased Interest 3 0 0 0 0 0 0  Down, Depressed, Hopeless 0 0 0 0 0 0 0  PHQ - 2 Score 3 0 0 0 0 0 0  Altered sleeping 3        Tired, decreased energy 3        Change in appetite 3        Feeling bad or failure about yourself  0        Trouble concentrating 1        Moving slowly or fidgety/restless 0        Suicidal thoughts 0        PHQ-9 Score 13        Difficult doing work/chores Somewhat difficult           Review of Systems  Neurological:  Positive for headaches.       Headaches  Hematological:        Brain Tumor (to see Neuro Oncologist on 10/07/23)  All other systems reviewed and are negative.       Objective:   Physical Exam Vitals and nursing note reviewed.  Constitutional:      Appearance: Normal appearance.  Cardiovascular:     Rate and Rhythm: Normal rate and regular rhythm.     Pulses: Normal pulses.     Heart sounds: Normal heart sounds.  Pulmonary:     Effort: Pulmonary effort is normal.     Breath sounds: Normal breath sounds.  Musculoskeletal:     Comments: Normal Muscle Bulk and Muscle Testing Reveals:  Upper Extremities: Full ROM and Muscle Strength 5/5 Right AC Joint Tenderness  Lower Extremities: Full ROM and Muscle Strength 5/5 Arises from Table slowly  Narrow Based  Gait     Skin:    General: Skin is warm and dry.  Neurological:     Mental Status: He is alert and oriented to person, place, and time.  Psychiatric:        Mood and Affect: Mood normal.        Behavior: Behavior normal.          Assessment & Plan:  Glioma : S/P on 09/14/2023: Dr. Lanis:  Neurosurger and Dr Buckley Following. Continue to monitor.  STEREOTACTIC RIGHT TEMPRO-OCCIPITAL CRANIOTOMY FOR RESECTION OF TUMOR Right General  COMPUTER-ASSISTED NAVIGATION, FOR CRANIAL PROCEDURE    Essential Hypertension: Continue current medication regimen. PCP following. Continue to Monitor.   F/U with Dr Emeline in 4- 6 weeks

## 2023-10-06 NOTE — Therapy (Unsigned)
 OUTPATIENT SPEECH LANGUAGE PATHOLOGY EVALUATION   Patient Name: Joel Williams MRN: [id number] DOB:06-30-1950, 73 y.o., male Today's Date: 10/06/2023  PCP: [name], MD REFERRING PROVIDER: Pegge Toribio PARAS, PA-C  END OF SESSION:  End of Session - 10/06/23 0927     Visit Number 1    Number of Visits 17    Date for SLP Re-Evaluation 01/06/24    SLP Start Time 0930    SLP Stop Time  1015    SLP Time Calculation (min) 45 min    Activity Tolerance Patient tolerated treatment well          Past Medical History:  Diagnosis Date   Allergic rhinitis    Arthritis    ASTHMA 06/08/2008   Asthma    as a child   CAD (coronary artery disease)    a. s/p Promus DES (3.0 x 16 mm) to mid LAD 05/16/10;  b. Myoview  05/15/10: anteroseptal ischemia with EF 52%;   c. Cath 05/16/10: single vessel CAD with mLAD 90% tx with PCI and EF 50%;  d. 07/2011 Cath:  Patent stent, nonobs dzs, NL EF.   Chronic kidney disease    was seen by Dr. Lonna and released.   Colonic polyp    DIABETES MELLITUS, TYPE II 06/08/2008   GERD (gastroesophageal reflux disease)    Hemorrhoids    HYPERLIPIDEMIA 06/08/2008   HYPERTENSION 06/08/2008   Impotence of organic origin 08/24/2008   Myocardial infarction (HCC) 04/16/2010   Pneumonia    hx of   Renal vein thrombosis (HCC)    Seasonal allergies    Past Surgical History:  Procedure Laterality Date   APPLICATION OF CRANIAL NAVIGATION Right 09/14/2023   Procedure: COMPUTER-ASSISTED NAVIGATION, FOR CRANIAL PROCEDURE;  Surgeon: Lanis Pupa, MD;  Location: [facility] OR;  Service: Neurosurgery;  Laterality: Right;   BACK SURGERY  2011   CATARACT EXTRACTION Bilateral    2024   CORONARY ANGIOPLASTY WITH STENT PLACEMENT     CRANIOTOMY Right 09/14/2023   Procedure: STEREOTACTIC RIGHT TEMPRO-OCCIPITAL CRANIOTOMY FOR RESECTION OF TUMOR;  Surgeon: Lanis Pupa, MD;  Location: [facility] OR;  Service: Neurosurgery;  Laterality: Right;   LEFT HEART CATHETERIZATION  WITH CORONARY ANGIOGRAM N/A 07/29/2011   Procedure: LEFT HEART CATHETERIZATION WITH CORONARY ANGIOGRAM;  Surgeon: Lonni JONETTA Cash, MD;  Location: [facility] CATH LAB;  Service: Cardiovascular;  Laterality: N/A;   LEFT HEART CATHETERIZATION WITH CORONARY ANGIOGRAM N/A 06/14/2013   Procedure: LEFT HEART CATHETERIZATION WITH CORONARY ANGIOGRAM;  Surgeon: Lonni JONETTA Cash, MD;  Location: [facility] CATH LAB;  Service: Cardiovascular;  Laterality: N/A;   RADIOLOGY WITH ANESTHESIA N/A 06/09/2022   Procedure: MRI WITH ANESTHESIA OF LUMBAR SPINE WITHOUT CONTRAST;  Surgeon: Radiologist, Medication, MD;  Location: [facility] OR;  Service: Radiology;  Laterality: N/A;   Patient Active Problem List   Diagnosis Date Noted   Glioma (HCC) 09/17/2023   Status post craniotomy 09/14/2023   Brain tumor (HCC) 09/14/2023   Headache 08/23/2023   Lower urinary tract symptoms (LUTS) 08/19/2023   Obesity with body mass index (BMI) of 30.0 to 39.9 07/08/2017   Chronic diastolic CHF (congestive heart failure) (HCC) 07/07/2017   CAD (coronary artery disease)    Unintentional weight loss 08/23/2008   History of colonic polyps 08/23/2008   Diabetes mellitus without complication (HCC) 06/08/2008   Hyperlipidemia 06/08/2008   Essential hypertension 06/08/2008   Asthma 06/08/2008    ONSET DATE: Referred on 09/22/2023   REFERRING DIAG: D49.6 (ICD-10-CM) - Brain tumor (HCC)  THERAPY DIAG:  Cognitive communication deficit  Rationale for Evaluation and Treatment: Rehabilitation  SUBJECTIVE:   SUBJECTIVE STATEMENT: Pt was pleasant and cooperative throughout evaluation  Pt accompanied by: significant other; [name]  PERTINENT HISTORY: Per chart review: 73 year old right-handed male with history significant for asthma and quit smoking 21 years ago, CAD maintained on low-dose aspirin , CKD stage III, renal vein thrombosis seen by renal services in the past and since signed off, diabetes mellitus with insulin  pump, hypertension,  hyperlipidemia.  PAIN:  Are you having pain? No; headaches in the mornings   FALLS: Has patient fallen in last 6 months?  No  LIVING ENVIRONMENT: Lives with: lives with their spouse Lives in: House/apartment  PLOF:  Level of assistance: Independent with ADLs, Independent with IADLs Employment: Retired; worked in Designer, fashion/clothing   PATIENT GOALS:   OBJECTIVE:  Note: Objective measures were completed at Evaluation unless otherwise noted.  DIAGNOSTIC FINDINGS: Per EMR:   MR Brain Wo Contrast 08/30/2023 IMPRESSION: 5.5 cm mass in the right temporal occipital lobes with mild surrounding vasogenic edema and local mass effect. No midline shift. Differential includes primary CNS neoplasm and metastatic disease among other etiologies. Recommend contrast enhanced MRI for further evaluation.   Mild chronic microvascular ischemic changes and mild parenchymal volume loss.   Focal susceptibility in the lateral right cerebellum which may reflect chronic microhemorrhage or small vascular malformation.   These results will be called to the ordering clinician or representative by the Radiologist Assistant, and communication documented in the PACS or [employer].     Electronically Signed   By: Donnice Mania M.D.   On: 08/30/2023 17:54  COGNITION: Overall cognitive status: Impaired Areas of impairment:  Attention: Impaired: Alternating, Divided Executive function: Impaired: Organization, Planning, Slow processing, and Comment: motivation to initiate tasks Functional deficits: See clinical impression  COGNITIVE COMMUNICATION: Following directions: Follows multi-step commands inconsistently and with increased time Auditory comprehension: Impaired: delayed processing, needs repetition (likely 2/2 to attention) Verbal expression: WFL; report some word finding difficulty at home. None noted in today's eval Functional communication: Impaired: Difficulty with recall of information from  conversations,   ORAL MOTOR EXAMINATION: Overall status: Did not assess Comments: NA  STANDARDIZED ASSESSMENTS:   Cognitive Linguistic Quick Test: AGE - 18 - 69   The Cognitive Linguistic Quick Test (CLQT) was administered to assess the relative status of five cognitive domains: attention, memory, language, executive functioning, and visuospatial skills. Scores from 10 tasks were used to estimate severity ratings (standardized for age groups 18-69 years and 70-89 years) for each domain, a clock drawing task, as well as an overall composite severity rating of cognition.       Task Score Criterion Cut Scores  Personal Facts 8/8 8  Symbol Cancellation 0/12 11  Confrontation Naming 10/10 10  Clock Drawing  13/13 12  Story Retelling 8/10 6  Symbol Trails 9/10 *had a lot of trouble with two practice 9  Generative Naming 4/9 5  Design Memory 4/6 5  Mazes  6/8 7  Design Generation 3/13 6    Cognitive Domain Composite Score Severity Rating  Attention 78/215 Moderate  Memory 148/185 WNL  Executive Function 22/40 WNL  Language 30/37 WNL  Visuospatial Skills 55/105 Mild  Clock Drawing  13/13 WNL  Composite Severity Rating  Mild     PATIENT REPORTED OUTCOME MEASURES (PROM): To completed in subsequent sessions  TREATMENT DATE:      Reading compensations, details from conversations daughter and wife managing meds and finances. Hoping to get back to managing his own.     PATIENT EDUCATION: Education details: SLP Role, cognitive-communication Person educated: Patient and Spouse Education method: Explanation Education comprehension: needs further education   GOALS: Goals reviewed with patient? {yes/no:[id number]}  SHORT TERM GOALS: Target date: ***  *** Baseline: Goal status: INITIAL  2.  *** Baseline:  Goal status: INITIAL  3.  *** Baseline:  Goal  status: INITIAL  4.  *** Baseline:  Goal status: INITIAL  5.  *** Baseline:  Goal status: INITIAL  6.  *** Baseline:  Goal status: INITIAL  LONG TERM GOALS: Target date: ***  *** Baseline:  Goal status: INITIAL  2.  *** Baseline:  Goal status: INITIAL  3.  *** Baseline:  Goal status: INITIAL  4.  *** Baseline:  Goal status: INITIAL  5.  *** Baseline:  Goal status: INITIAL  6.  *** Baseline:  Goal status: INITIAL  ASSESSMENT:  CLINICAL IMPRESSION: Pt is a 73 yo male who presents to [facility] for evaluation post brain tumor resection. Pt endorses difficulty with focus, reading, and comprehension. He feels he knows what he needs to do, but it's hard to get it done. Reports no depression or anxiety. Pt leads a men's Bible study - having trouble getting the lesson started, fine print in the Bible is hard to read. [name], his wife, agrees he is having trouble getting started to complete tasks re: shower etc. She also reports she has observed some slowed processing, difficulty retrieving names and anomia when attempting to describe.  Pt was assessed using CLQT - see above for results. Pt required repetition of directions throughout assessment. Wife reports he needs to bring his glasses, as this likely impacted Symbol Cancellation task score. SLP rec skilled ST services to address cognitive-communication impairment.    OBJECTIVE IMPAIRMENTS: include attention and executive functioning. These impairments are limiting patient from managing medications, managing appointments, managing finances, household responsibilities, and effectively communicating at home and in community. Factors affecting potential to achieve goals and functional outcome are NA.SABRA Patient will benefit from skilled SLP services to address above impairments and improve overall function.  REHAB POTENTIAL: Good  PLAN:  SLP FREQUENCY: 1-2x/week (based off chemo/rad schedule - to find out tomorrow)  SLP DURATION:  8 weeks  PLANNED INTERVENTIONS: Environmental controls, Cueing hierachy, Cognitive reorganization, Internal/external aids, Functional tasks, SLP instruction and feedback, Compensatory strategies, Patient/family education, and [id number] Treatment of speech (30 or 45 min)     [employer], CCC-SLP 10/06/2023, 12:15 PM

## 2023-10-07 ENCOUNTER — Telehealth: Payer: Self-pay

## 2023-10-07 ENCOUNTER — Other Ambulatory Visit (HOSPITAL_COMMUNITY): Payer: Self-pay

## 2023-10-07 ENCOUNTER — Telehealth: Payer: Self-pay | Admitting: Pharmacist

## 2023-10-07 ENCOUNTER — Inpatient Hospital Stay: Admitting: Internal Medicine

## 2023-10-07 VITALS — BP 141/67 | HR 76 | Temp 97.8°F | Resp 17 | Ht 69.0 in | Wt 217.4 lb

## 2023-10-07 DIAGNOSIS — Z79899 Other long term (current) drug therapy: Secondary | ICD-10-CM | POA: Insufficient documentation

## 2023-10-07 DIAGNOSIS — C712 Malignant neoplasm of temporal lobe: Secondary | ICD-10-CM | POA: Diagnosis not present

## 2023-10-07 DIAGNOSIS — N189 Chronic kidney disease, unspecified: Secondary | ICD-10-CM | POA: Diagnosis not present

## 2023-10-07 DIAGNOSIS — Z87891 Personal history of nicotine dependence: Secondary | ICD-10-CM | POA: Diagnosis not present

## 2023-10-07 DIAGNOSIS — C719 Malignant neoplasm of brain, unspecified: Secondary | ICD-10-CM

## 2023-10-07 DIAGNOSIS — I129 Hypertensive chronic kidney disease with stage 1 through stage 4 chronic kidney disease, or unspecified chronic kidney disease: Secondary | ICD-10-CM

## 2023-10-07 MED ORDER — ONDANSETRON HCL 8 MG PO TABS
8.0000 mg | ORAL_TABLET | Freq: Three times a day (TID) | ORAL | 1 refills | Status: DC | PRN
  Filled 2023-10-07 – 2023-10-13 (×2): qty 30, 10d supply, fill #0

## 2023-10-07 MED ORDER — TEMOZOLOMIDE 20 MG PO CAPS: 20.0000 mg | ORAL_CAPSULE | Freq: Every day | ORAL | 0 refills | Status: DC

## 2023-10-07 MED ORDER — TEMOZOLOMIDE 140 MG PO CAPS
140.0000 mg | ORAL_CAPSULE | Freq: Every day | ORAL | 0 refills | Status: DC
  Filled 2023-10-13: qty 30, 30d supply, fill #0
  Filled 2023-11-12 – 2023-11-19 (×3): qty 12, 12d supply, fill #1

## 2023-10-07 MED ORDER — TEMOZOLOMIDE 140 MG PO CAPS: 140.0000 mg | ORAL_CAPSULE | Freq: Every day | ORAL | 0 refills | Status: DC

## 2023-10-07 MED ORDER — TEMOZOLOMIDE 20 MG PO CAPS
20.0000 mg | ORAL_CAPSULE | Freq: Every day | ORAL | 0 refills | Status: DC
  Filled 2023-10-13: qty 30, 30d supply, fill #0
  Filled 2023-11-12 – 2023-11-19 (×3): qty 12, 12d supply, fill #1

## 2023-10-07 NOTE — Progress Notes (Signed)

## 2023-10-07 NOTE — Telephone Encounter (Signed)
 Oral Oncology Patient Advocate Encounter   Received notification that Temozolomide  20MG  & 140MG  capsules both need to be billed under Part B. Odetta will show me how to do on Friday 10/08/23.       Lucie Lamer, CPhT  she/her/hers [place]  [facility] Pharmacy Technician Patient Advocate Specialist II [facility] Phone: [phone]  Fax: [phone] [email or web address]

## 2023-10-07 NOTE — Telephone Encounter (Signed)
 Oral Oncology Pharmacist Encounter  Received new prescription for Temodar  (temozolomide ) for the treatment of glioblastoma in conjunction with radiation, planned duration 42 days.  BMP from 09/24/23 and CBC w/ Diff from 09/20/23 assessed, no relevant lab abnormalities requiring baseline dose adjustment required at this time. Prescription dose and frequency assessed for appropriateness.  Current medication list in Epic reviewed, no relevant/significant DDIs with Temodar  identified.  Evaluated chart and no patient barriers to medication adherence noted.   Patient agreement for treatment documented in MD note on 10/07/23.  Prescription has been e-scribed to the [facility] for benefits analysis and approval.  Oral Oncology Clinic will continue to follow for insurance authorization, copayment issues, initial counseling and start date.  [name], PharmD, BCPS, BCOP Hematology/Oncology Clinical Pharmacist [phone] 10/07/2023 2:44 PM

## 2023-10-07 NOTE — Progress Notes (Signed)
 [facility] [address]  [place], [place] [address] [phone]   New Patient Evaluation  Date of Service: 10/07/23 Patient Name: Joel Williams Patient MRN: [id number] Patient DOB: 09-19-1950 Provider: Arthea MARLA Manns, MD  Identifying Statement:  Joel Williams is a 73 y.o. male with right temporal glioblastoma who presents for initial consultation and evaluation.    Referring Provider: [name], MD [address] [place],  [place] [address]  Oncologic History: Oncology History   No history exists.    Biomarkers:  MGMT Unknown.  IDH 1/2 Unknown.  EGFR Unknown  TERT Unknown   History of Present Illness: The patient's records from the referring physician were obtained and reviewed and the patient interviewed to confirm this HPI.  Joel Williams presented to neurologic attention last month with several weeks of confusion, fatigue, lethargy, visual complaints.  MRI brain was obtained which demonstrated a large enhancing mass in the right temporal lobe, consistent with primary brain tumor.  He underwent craniotomy, resection with Dr. Lanis on 09/16/23; path demonstrated glioblastoma.  Since surgery he has been doing well, walks on his own, independent with activities of daily living.  He has been having daily headaches, dosing Tylenol , Fioricet  and Topamax  25mg  twice per day for prevention.  Medications: Current Outpatient Medications on File Prior to Visit  Medication Sig Dispense Refill   acetaminophen  (TYLENOL ) 325 MG tablet Take 1 tablet (325 mg total) by mouth every 4 (four) hours as needed for mild pain (pain score 1-3) (temp > 100.5).     albuterol  (VENTOLIN  HFA) 108 (90 Base) MCG/ACT inhaler Inhale 1-2 puffs into the lungs every 6 (six) hours as needed for wheezing or shortness of breath. 6.7 g 0   amLODipine  (NORVASC ) 10 MG tablet Take 1 tablet (10 mg total) by mouth at bedtime. 30 tablet 0   Ascorbic  Acid (VITAMIN C ) 1000 MG tablet Take 1 tablet (1,000 mg total) by mouth in the morning and at bedtime. 30 tablet 0   B-D ULTRAFINE III SHORT PEN 31G X 8 MM MISC Inject 1 Syringe into the skin daily.     BIOTIN PO Take 1 tablet by mouth every evening.     butalbital -acetaminophen -caffeine  (FIORICET ) 50-325-40 MG tablet Take 1 tablet by mouth every 4 (four) hours as needed for headache. 14 tablet 0   Cholecalciferol  (VITAMIN D3) 125 MCG (5000 UT) TABS Take 1 tablet (5,000 Units total) by mouth in the morning. 30 tablet 0   Continuous Glucose Sensor (FREESTYLE LIBRE 3 PLUS SENSOR) MISC apply to upper back of arm for 90 days change sensor every 15 days IC-10 CODE: E11.9     insulin  aspart (NOVOLOG ) 100 UNIT/ML injection Inject 0-20 Units into the skin 4 (four) times daily -  before meals and at bedtime. CBG 70 - 120: 0 units CBG 121 - 150: 3 units CBG 151 - 200: 4 units CBG 201 - 250: 7 units CBG 251 - 300: 11 units CBG 301 - 350: 15 units CBG 351 - 400: 20 units CBG > 400: call MD and obtain STAT lab verification     insulin  aspart (NOVOLOG ) 100 UNIT/ML injection Inject 5 Units into the skin with breakfast, with lunch, and with evening meal.     irbesartan  (AVAPRO ) 150 MG tablet Take 1 tablet (150 mg total) by mouth daily. 30 tablet 0   levETIRAcetam  (KEPPRA ) 500 MG tablet Take 1 tablet (500 mg total) by mouth 2 (two)  times daily. 60 tablet 0   loratadine  (CLARITIN ) 10 MG tablet Take 1 tablet (10 mg total) by mouth daily. 30 tablet 0   metFORMIN  (GLUCOPHAGE -XR) 500 MG 24 hr tablet Take 2 tablets (1,000 mg total) by mouth 2 (two) times daily. 120 tablet 0   metoprolol  succinate (TOPROL -XL) 50 MG 24 hr tablet Take 1 tablet (50 mg total) by mouth daily. Take with or immediately following a meal. 30 tablet 0   Multiple Vitamins-Minerals (PRESERVISION AREDS 2 PO) Take 2 capsules by mouth at bedtime.     pantoprazole  (PROTONIX ) 40 MG tablet Take 1 tablet (40 mg total) by mouth at bedtime. 30 tablet 0    polyethylene glycol (MIRALAX  / GLYCOLAX ) 17 g packet Take 17 g by mouth daily as needed.     senna-docusate (SENOKOT-S) 8.6-50 MG tablet Take 1-2 tablets by mouth 2 (two) times daily between meals as needed for mild constipation or moderate constipation.     simvastatin  (ZOCOR ) 20 MG tablet Take 1 tablet (20 mg total) by mouth daily. 30 tablet 0   topiramate  (TOPAMAX ) 25 MG tablet Take 1 tablet (25 mg total) by mouth 2 (two) times daily. 60 tablet 1   vitamin E  180 MG (400 UNITS) capsule Take 1 capsule (400 Units total) by mouth in the morning. 30 capsule 0   Zinc  Sulfate 220 (50 Zn) MG TABS Take 1 tablet (220 mg total) by mouth at bedtime. 30 tablet 0   No current facility-administered medications on file prior to visit.    Allergies:  Allergies  Allergen Reactions   Invokana [Canagliflozin]     Yeast infections   Trulicity [Dulaglutide] Other (See Comments)    yeast infections   Past Medical History:  Past Medical History:  Diagnosis Date   Allergic rhinitis    Arthritis    ASTHMA 06/08/2008   Asthma    as a child   CAD (coronary artery disease)    a. s/p Promus DES (3.0 x 16 mm) to mid LAD 05/16/10;  b. Myoview  05/15/10: anteroseptal ischemia with EF 52%;   c. Cath 05/16/10: single vessel CAD with mLAD 90% tx with PCI and EF 50%;  d. 07/2011 Cath:  Patent stent, nonobs dzs, NL EF.   Chronic kidney disease    was seen by Dr. Lonna and released.   Colonic polyp    DIABETES MELLITUS, TYPE II 06/08/2008   GERD (gastroesophageal reflux disease)    Hemorrhoids    HYPERLIPIDEMIA 06/08/2008   HYPERTENSION 06/08/2008   Impotence of organic origin 08/24/2008   Myocardial infarction (HCC) 04/16/2010   Pneumonia    hx of   Renal vein thrombosis (HCC)    Seasonal allergies    Past Surgical History:  Past Surgical History:  Procedure Laterality Date   APPLICATION OF CRANIAL NAVIGATION Right 09/14/2023   Procedure: COMPUTER-ASSISTED NAVIGATION, FOR CRANIAL PROCEDURE;  Surgeon:  Lanis Pupa, MD;  Location: [facility] OR;  Service: Neurosurgery;  Laterality: Right;   BACK SURGERY  2011   CATARACT EXTRACTION Bilateral    2024   CORONARY ANGIOPLASTY WITH STENT PLACEMENT     CRANIOTOMY Right 09/14/2023   Procedure: STEREOTACTIC RIGHT TEMPRO-OCCIPITAL CRANIOTOMY FOR RESECTION OF TUMOR;  Surgeon: Lanis Pupa, MD;  Location: [facility] OR;  Service: Neurosurgery;  Laterality: Right;   LEFT HEART CATHETERIZATION WITH CORONARY ANGIOGRAM N/A 07/29/2011   Procedure: LEFT HEART CATHETERIZATION WITH CORONARY ANGIOGRAM;  Surgeon: Lonni JONETTA Cash, MD;  Location: [facility] C[facility] LAB;  Service: Cardiovascular;  Laterality: N/A;  LEFT HEART CATHETERIZATION WITH CORONARY ANGIOGRAM N/A 06/14/2013   Procedure: LEFT HEART CATHETERIZATION WITH CORONARY ANGIOGRAM;  Surgeon: Lonni JONETTA Cash, MD;  Location: [facility] CATH LAB;  Service: Cardiovascular;  Laterality: N/A;   RADIOLOGY WITH ANESTHESIA N/A 06/09/2022   Procedure: MRI WITH ANESTHESIA OF LUMBAR SPINE WITHOUT CONTRAST;  Surgeon: Radiologist, Medication, MD;  Location: [facility] OR;  Service: Radiology;  Laterality: N/A;   Social History:  Social History   Socioeconomic History   Marital status: Married    Spouse name: Not on file   Number of children: 2   Years of education: Not on file   Highest education level: Not on file  Occupational History   Occupation: Sales  Tobacco Use   Smoking status: Former    Current packs/day: 0.00    Average packs/day: 2.0 packs/day for 53.0 years (106.0 ttl pk-yrs)    Types: Cigarettes    Start date: 05/22/1950    Quit date: 05/23/2002    Years since quitting: 21.3   Smokeless tobacco: Never  Vaping Use   Vaping status: Never Used  Substance and Sexual Activity   Alcohol use: Not Currently    Comment: Ocassional since starting Ozempic - fall 2023.   Drug use: No   Sexual activity: Not Currently  Other Topics Concern   Not on file  Social History Narrative   Not on file   Social Drivers of  Health   Financial Resource Strain: Low Risk  (12/25/2022)   Overall Financial Resource Strain (CARDIA)    Difficulty of Paying Living Expenses: Not hard at all  Food Insecurity: No Food Insecurity (09/14/2023)   Hunger Vital Sign    Worried About Running Out of Food in the Last Year: Never true    Ran Out of Food in the Last Year: Never true  Transportation Needs: No Transportation Needs (09/14/2023)   PRAPARE - Administrator, Civil Service (Medical): No    Lack of Transportation (Non-Medical): No  Physical Activity: Insufficiently Active (12/25/2022)   Exercise Vital Sign    Days of Exercise per Week: 2 days    Minutes of Exercise per Session: 30 min  Stress: No Stress Concern Present (12/25/2022)   [employer] of Occupational Health - Occupational Stress Questionnaire    Feeling of Stress : Not at all  Social Connections: Socially Integrated (09/14/2023)   Social Connection and Isolation Panel    Frequency of Communication with Friends and Family: More than three times a week    Frequency of Social Gatherings with Friends and Family: More than three times a week    Attends Religious Services: More than 4 times per year    Active Member of [employer] or Organizations: Yes    Attends Engineer, structural: More than 4 times per year    Marital Status: Married  Catering manager Violence: Not At Risk (09/14/2023)   Humiliation, Afraid, Rape, and Kick questionnaire    Fear of Current or Ex-Partner: No    Emotionally Abused: No    Physically Abused: No    Sexually Abused: No   Family History:  Family History  Problem Relation Age of Onset   Diabetes Father    Heart disease Father    Angina Father    Dementia Mother     Review of Systems: Constitutional: Doesn't report fevers, chills or abnormal weight loss Eyes: Doesn't report blurriness of vision Ears, nose, mouth, throat, and face: Doesn't report sore throat Respiratory: Doesn't report cough, dyspnea or  wheezes Cardiovascular: Doesn't report palpitation, chest discomfort  Gastrointestinal:  Doesn't report nausea, constipation, diarrhea GU: Doesn't report incontinence Skin: Doesn't report skin rashes Neurological: Per HPI Musculoskeletal: Doesn't report joint pain Behavioral/Psych: Doesn't report anxiety  Physical Exam: Vitals:   10/07/23 1054  BP: (!) 141/67  Pulse: 76  Resp: 17  Temp: 97.8 F (36.6 C)  SpO2: 97%   KPS: 90. General: Alert, cooperative, pleasant, in no acute distress Head: Normal EENT: No conjunctival injection or scleral icterus.  Lungs: Resp effort normal Cardiac: Regular rate Abdomen: Non-distended abdomen Skin: No rashes cyanosis or petechiae. Extremities: No clubbing or edema  Neurologic Exam: Mental Status: Awake, alert, attentive to examiner. Oriented to self and environment. Language is fluent with intact comprehension.  Cranial Nerves: Visual acuity is grossly normal. Visual fields are full. Extra-ocular movements intact. No ptosis. Face is symmetric Motor: Tone and bulk are normal. Power is full in both arms and legs. Reflexes are symmetric, no pathologic reflexes present.  Sensory: Intact to light touch Gait: Normal.   Labs: I have reviewed the data as listed    Component Value Date/Time   NA 137 09/24/2023 1039   NA 137 07/16/2017 0000   K 3.7 09/24/2023 1039   CL 103 09/24/2023 1039   CO2 25 09/24/2023 1039   GLUCOSE 148 (H) 09/24/2023 1039   BUN 8 09/24/2023 1039   BUN 16 07/16/2017 0000   CREATININE 0.87 09/24/2023 1039   CREATININE 0.72 02/24/2016 0806   CALCIUM 8.9 09/24/2023 1039   PROT 6.5 09/20/2023 0500   PROT 6.8 08/19/2023 0940   ALBUMIN 3.1 (L) 09/20/2023 0500   AST 20 09/20/2023 0500   ALT 16 09/20/2023 0500   ALKPHOS 45 09/20/2023 0500   BILITOT 1.1 09/20/2023 0500   GFRNONAA >60 09/24/2023 1039   GFRAA >90 07/29/2011 1120   Lab Results  Component Value Date   WBC 4.4 09/20/2023   NEUTROABS 3.0 09/20/2023    HGB 13.5 09/20/2023   HCT 39.6 09/20/2023   MCV 83.0 09/20/2023   PLT 198 09/20/2023    Imaging:  CT HEAD WO CONTRAST ( ) Result Date: 09/19/2023 CLINICAL DATA:  73 year old male with headache, status post resection of cystic and solid right temporal lobe mass postoperative day 5. EXAM: CT HEAD WITHOUT CONTRAST TECHNIQUE: Contiguous axial images were obtained from the base of the skull through the vertex without intravenous contrast. RADIATION DOSE REDUCTION: This exam was performed according to the departmental dose-optimization program which includes automated exposure control, adjustment of the mA and/or kV according to patient size and/or use of iterative reconstruction technique. COMPARISON:  Postoperative brain MRI 09/15/2023. FINDINGS: Brain: Small volume postoperative pneumocephalus. Right temporal lobe resection cavity with heterogeneous mixed density fluid and small volume postoperative gas. Small volume intraventricular hemorrhage not apparent now. Small volume postoperative subdural collection and/or dural repair. Stable ventricle size and configuration. No significant midline shift. Basilar cisterns are patent. Gray-white differentiation elsewhere within normal limits. Vascular: Calcified atherosclerosis at the skull base. No suspicious intracranial vascular hyperdensity. Skull: Posterior right craniotomy. Sinuses/Orbits: Partial opacified right tympanic cavity and mastoid status post craniotomy closely abutting the right mastoid air cells. Other paranasal sinuses and mastoids are well aerated. Other: Postoperative changes to the right scalp. Skin staples in place. Negative orbits soft tissues. IMPRESSION: 1. Recent resection of Right temporal lobe mass, stable from postoperative MRI. 2. No new intracranial abnormality identified. Electronically Signed   By: VEAR Hurst M.D.   On: 09/19/2023 10:56   MR BRAIN W  WO CONTRAST Result Date: 09/15/2023 CLINICAL DATA:  73 year old male with a dominant  cystic and solid enhancing mass of the right hemisphere occupying the posterior right temporal lobe, associated regional petechial and abnormal right temporal horn ependymal enhancement. Postoperative day 1 status post stereotactic craniotomy and resection of tumor. Preoperative EXAM: MRI HEAD WITHOUT AND WITH CONTRAST TECHNIQUE: Multiplanar, multiecho pulse sequences of the brain and surrounding structures were obtained without and with intravenous contrast. CONTRAST:  10mL GADAVIST  GADOBUTROL  1 MMOL/ML IV SOLN COMPARISON:  MRI 09/02/2023, 08/30/2023. FINDINGS: Brain: Right side craniotomy now and cystic resection cavity of the posterior right temporal lobe at the epicenter of previously demonstrated combined cystic and solid tumor (series 10, image 10). Small volume right hemisphere pneumocephalus. Small volume extra-axial fluid collection in the right hemisphere and underlying the craniotomy flap. Patchy diffusion restriction along the margins of the resection, especially posteriorly (series 5, image 72). Small scattered T1 hyperintense blood products also along the margins. Following contrast small enhancing satellite tumor nodule measuring 5 mm redemonstrated posterior to the resection site (series 18, image 7). And there is also a small volume of patchy enhancement along the posterosuperior resection cavity best seen on series 18, image 7. The punctate and petechial mesial right temporal lobe enhancement near the temporal horn is less apparent now (series 16, image 23) and the right temporal horn ependymal enhancement seems resolved. As before there is little regional T2 and FLAIR hyperintensity beyond the tumor epicenter. And as on the preoperative scan there is no midline shift or significant intracranial mass effect. Trace intraventricular blood layering in the atria. No ventriculomegaly. No superimposed restricted diffusion suggestive of acute infarction. No new or distant enhancement is identified.  Cervicomedullary junction and pituitary are within normal limits. Basilar cisterns are within normal limits. Vascular: Major intracranial vascular flow voids are stable. Following contrast the major dural venous sinuses are enhancing and appear to be patent. Skull and upper cervical spine: Right side craniotomy. Background bone marrow signal within normal limits. Negative visible cervical spine and spinal cord. Sinuses/Orbits: Stable and negative. Other: Right mastoid effusion is new and appears to be postoperative in nature. Grossly negative other visible internal auditory structures. IMPRESSION: 1. Resection of heterogeneous 5.6 cm tumor occupying the posterior right temporal lobe. Patchy ischemia, mild residual enhancement most pronounced along the posterior resection cavity. And unchanged small 5 mm enhancing tumor nodule posterior to the tumor site. Right temporal horn ependymal and mesial temporal lobe petechial enhancement not apparent now. 2. Other expected postoperative findings in the right hemisphere. Electronically Signed   By: VEAR Hurst M.D.   On: 09/15/2023 04:56    Pathology: SURGICAL PATHOLOGY CASE: [id number] PATIENT: Joel Williams Surgical Pathology Report  Clinical History: brain tumor (cm)  FINAL MICROSCOPIC DIAGNOSIS:  A. BRAIN, RIGHT TEMPORAL LOBE, TUMOR, RESECTION: High-grade glioma, WHO grade 4  COMMENT:  Sections show a highly cellular tumor exhibiting extensive and marked vascular proliferation and innumerable foci of palisading necrosis.  The tumor is composed of highly atypical cells with variably enlarged round to oval irregular hyperchromatic nuclei.  Mitotic figures are readily identified.  These histologic features support the above diagnosis.  The tumor will be forwarded for IDH mutation analysis with these results issued in a subsequent addendum to this report.  Case is reviewed by Dr. Rebbecca who concurs with the interpretation.  Diagnosis conveyed  to Dr. Lanis by Dr. Reed via Epic messaging on 09/15/2023 @ 3:20 PM.   GROSS DESCRIPTION:  The specimen is received fresh  and consists of a 6.4 x 4.5 x 3.5 cm piece of tan-red to gray softened tissue.  Sectioning reveals a tan-pink to red focally hemorrhagic cut surfaces.  Representative sections are submitted in 8 cassettes.  (KL 09/14/2023)  Final Diagnosis performed by Prentice Pitcher, MD.   Electronically signed 09/15/2023    Assessment/Plan Glioblastoma, IDH-wildtype [facility])  We appreciate the opportunity to participate in the care of EVERARD INTERRANTE.  He is clinically stable, following recent glioblastoma resection.     We had an extensive conversation with him and his family regarding pathology, prognosis, and available treatment pathways.  We are encouraged by his good functional status, good quality resection.    We ultimately recommended proceeding with course of intensity modulated radiation therapy and concurrent daily Temozolomide .  Radiation will be administered Mon-Fri over 6 weeks, Temodar  will be dosed at 75mg /m2 to be given daily over 42 days.  We reviewed side effects of temodar , including fatigue, nausea/vomiting, constipation, and cytopenias.  Informed consent was verbally obtained at bedside to proceed with oral chemotherapy.  Chemotherapy should be held for the following:  ANC less than 1,000  Platelets less than 100,000  LFT or creatinine greater than 2x ULN  If clinical concerns/contraindications develop  Every 2 weeks during radiation, labs will be checked accompanied by a clinical evaluation in the brain tumor clinic.  We also discussed and patient consented for additional tumor profiling and sequencing through [facility].  Advanced tumor profiling could help identify actionable mutation for targeted therapy and lead to direct clinical benefit.     Keppra  may be discontinued given lack of clinical seizures.  Screening for potential clinical  trials was performed and discussed using eligibility criteria for active protocols at [facility], loco-regional tertiary centers, as well as national database available on [email or web address].    The patient is not a candidate for a research protocol at this time due to no suitable study identified.   We spent twenty additional minutes teaching regarding the natural history, biology, and historical experience in the treatment of brain tumors. We then discussed in detail the current recommendations for therapy focusing on the mode of administration, mechanism of action, anticipated toxicities, and quality of life issues associated with this plan. We also provided teaching sheets for the patient to take home as an additional resource.  All questions were answered. The patient knows to call the clinic with any problems, questions or concerns. No barriers to learning were detected.  The total time spent in the encounter was 60 minutes and more than 50% was on counseling and review of test results   Arthea MARLA Manns, MD Medical Director of Neuro-Oncology [facility] 10/07/23 11:13 AM

## 2023-10-08 ENCOUNTER — Other Ambulatory Visit: Payer: Self-pay

## 2023-10-08 ENCOUNTER — Ambulatory Visit (HOSPITAL_COMMUNITY)

## 2023-10-08 ENCOUNTER — Other Ambulatory Visit (HOSPITAL_COMMUNITY): Payer: Self-pay

## 2023-10-08 ENCOUNTER — Other Ambulatory Visit: Payer: Self-pay | Admitting: Radiation Therapy

## 2023-10-08 ENCOUNTER — Encounter: Payer: Self-pay | Admitting: Internal Medicine

## 2023-10-08 ENCOUNTER — Inpatient Hospital Stay: Admitting: Licensed Clinical Social Worker

## 2023-10-08 DIAGNOSIS — C719 Malignant neoplasm of brain, unspecified: Secondary | ICD-10-CM

## 2023-10-08 NOTE — Telephone Encounter (Signed)
 Oral Oncology Patient Advocate Encounter  Temozolomide  20mg  & 140mg  are covered under Part B.   Patients co-pay is $0.00.    Lucie Lamer, CPhT  she/her/hers   [facility] Pharmacy Technician Patient Advocate Specialist II [facility] Phone: [phone]  Fax: [phone] [email or web address]

## 2023-10-08 NOTE — Progress Notes (Signed)
 [facility] Clinical Social Work  Initial Assessment   Joel Williams is a 73 y.o. year old male contacted by phone. Clinical Social Work was referred by medical provider for assessment of psychosocial needs.   SDOH (Social Determinants of Health) assessments performed: Yes SDOH Interventions    Flowsheet Row Office Visit from 10/07/2023 in [facility] - A Dept Of . [facility] Clinical Support from 12/25/2022 in [facility] [place] Office Visit from 12/22/2021 in [facility] [place] Clinical Support from 12/17/2020 in [facility] [place]  SDOH Interventions      Food Insecurity Interventions Intervention Not Indicated Intervention Not Indicated Intervention Not Indicated Intervention Not Indicated  Housing Interventions Intervention Not Indicated Intervention Not Indicated Intervention Not Indicated Intervention Not Indicated  Transportation Interventions Intervention Not Indicated Intervention Not Indicated Intervention Not Indicated Intervention Not Indicated  Utilities Interventions Intervention Not Indicated Intervention Not Indicated Intervention Not Indicated --  Alcohol Usage Interventions -- -- Intervention Not Indicated (Score <7) --  Financial Strain Interventions -- Intervention Not Indicated Intervention Not Indicated Intervention Not Indicated  Physical Activity Interventions -- Intervention Not Indicated Intervention Not Indicated Intervention Not Indicated  Stress Interventions -- Intervention Not Indicated Intervention Not Indicated Intervention Not Indicated  Social Connections Interventions -- Intervention Not Indicated Intervention Not Indicated Intervention Not Indicated  Health Literacy Interventions -- Intervention Not Indicated -- --    SDOH Screenings   Food Insecurity: No Food Insecurity (10/07/2023)  Housing: Low Risk  (10/07/2023)  Transportation Needs: No  Transportation Needs (10/07/2023)  Utilities: Not At Risk (10/07/2023)  Alcohol Screen: Low Risk  (12/22/2021)  Depression (PHQ2-9): Low Risk  (10/07/2023)  Recent Concern: Depression (PHQ2-9) - High Risk (08/19/2023)  Financial Resource Strain: Low Risk  (12/25/2022)  Physical Activity: Insufficiently Active (12/25/2022)  Social Connections: Socially Integrated (09/14/2023)  Stress: No Stress Concern Present (12/25/2022)  Tobacco Use: Medium Risk (10/06/2023)  Health Literacy: Adequate Health Literacy (12/25/2022)     Distress Screen completed: No     No data to display            Family/Social Information:  Housing Arrangement: patient lives with his wife.  Since surgery pt reports he is more cautious walking due to balance issues, but is independent in ADLs and doing much better overall.  Family members/support persons in your life? Pt has 2 daughters and their husbands living nearby, as well as 4 grandchildren. Transportation concerns: no  Employment: Retired .  Income source: Actor concerns: No Type of concern: None Food access concerns: no Religious or spiritual practice: Yes-[employer] Advanced directives: Yes-uploaded to chart Services Currently in place:  none  Coping/ Adjustment to diagnosis: Patient understands treatment plan and what happens next? yes Concerns about diagnosis and/or treatment: Overwhelmed by information and Quality of life Patient reported stressors: Adjusting to my illness and Physical issues Hopes and/or priorities: pt's priority is to start treatment w/ the hope of positive results Patient enjoys time with family/ friends Current coping skills/ strengths: Capable of independent living , Motivation for treatment/growth , Physical Health , and Supportive family/friends     SUMMARY: Current SDOH Barriers:  No barriers identified at this time  Clinical Social Work Clinical Goal(s):  No clinical social work goals at  this time  Interventions: Discussed common feeling and emotions when being diagnosed with cancer, and the importance of support during treatment Informed patient of the support team roles and  support services at [facility] Provided CSW contact information and encouraged patient to call with any questions or concerns Pt will come in next week to complete additional assessments   Follow Up Plan: CSW will see patient on 7/29 Patient verbalizes understanding of plan: Yes    Devere JONELLE Manna, LCSW Clinical Social Worker [facility]

## 2023-10-11 ENCOUNTER — Ambulatory Visit: Admitting: Occupational Therapy

## 2023-10-11 ENCOUNTER — Other Ambulatory Visit: Payer: Self-pay

## 2023-10-11 DIAGNOSIS — R2689 Other abnormalities of gait and mobility: Secondary | ICD-10-CM | POA: Diagnosis not present

## 2023-10-11 DIAGNOSIS — R41842 Visuospatial deficit: Secondary | ICD-10-CM | POA: Diagnosis not present

## 2023-10-11 DIAGNOSIS — M6281 Muscle weakness (generalized): Secondary | ICD-10-CM | POA: Diagnosis not present

## 2023-10-11 DIAGNOSIS — R2681 Unsteadiness on feet: Secondary | ICD-10-CM

## 2023-10-11 DIAGNOSIS — I69818 Other symptoms and signs involving cognitive functions following other cerebrovascular disease: Secondary | ICD-10-CM | POA: Diagnosis not present

## 2023-10-11 DIAGNOSIS — R41841 Cognitive communication deficit: Secondary | ICD-10-CM | POA: Diagnosis not present

## 2023-10-11 NOTE — Progress Notes (Signed)
 Radiation Oncology         [phone] ________________________________  Initial outpatient Consultation  Name: Joel Williams MRN: [id number]  Date: 10/12/2023  DOB: 1950-06-11  [id number], Joel Ned, MD  Joel Arthea POUR, MD   REFERRING PHYSICIAN: Buckley Arthea POUR, MD  DIAGNOSIS:  No diagnosis found.   Grade 4 right temporal glioblastoma IDH-wildtype (HCC)   HISTORY OF PRESENT ILLNESS::Joel Williams is a 73 y.o. male who presented to Dr. Lanis on 09/01/23 with several weeks of confusion, fatigue, lethargy, visual complaints.   To further investigate his symptoms, he underwent a brain MRI on 09/02/23 showing  a heterogeneous mass involving the posterior aspect of the right temporal lobe extending into the lateral right occipital lobe with associated vasogenic edema within the right temporal occipital lobes. Mass measures 5.6 cm AP diameter, 3.6 cm in transverse diameter and 3.5 cm in craniocaudad length. Scan also indicated additional nodular foci of enhancement within the right cerebral hemisphere and there is abnormal enhancement of the ependyma lining the temporal horn of the right lateral ventricle.  Subsequently, he underwent a computer assisted stereotactic right tempro-occipital craniotomy for resection of tumor on 09/14/23 under the care of Dr. Lanis. Surgical pathology indicated grade 4 glioblastoma.    Pertinent imaging include: --CT c/a/p on 09/02/23 showing small enhancing focus in the liver measuring 13 mm. --Brain MRI on 09/15/23 showing resection of heterogeneous 5.6 cm tumor occupying the posterior right temporal lobe with patchy ischemia, mild residual enhancement most pronounced along the posterior resection cavity. And unchanged small 5 mm enhancing tumor nodule posterior to the tumor site. Scan also indicated previous right temporal horn ependymal and mesial temporal lobe petechial enhancement are resolved. --CT head on 09/19/23 showing no new  intracranial abnormality identified.    In light of findings, he was referred to Dr. Buckley on 10/07/23 to discuss further plans and prognosis. Upon discussion, patient opted to proceed with a  course of intensity modulated radiation therapy and concurrent daily Temozolomide  dosed at 75mg /m2 to be given daily over 42 days.   PREVIOUS RADIATION THERAPY: No  PAST MEDICAL HISTORY:  has a past medical history of Allergic rhinitis, Arthritis, ASTHMA (06/08/2008), Asthma, CAD (coronary artery disease), Chronic kidney disease, Colonic polyp, DIABETES MELLITUS, TYPE II (06/08/2008), GERD (gastroesophageal reflux disease), Hemorrhoids, HYPERLIPIDEMIA (06/08/2008), HYPERTENSION (06/08/2008), Impotence of organic origin (08/24/2008), Myocardial infarction (HCC) (04/16/2010), Pneumonia, Renal vein thrombosis (HCC), and Seasonal allergies.    PAST SURGICAL HISTORY: Past Surgical History:  Procedure Laterality Date   APPLICATION OF CRANIAL NAVIGATION Right 09/14/2023   Procedure: COMPUTER-ASSISTED NAVIGATION, FOR CRANIAL PROCEDURE;  Surgeon: Joel Pupa, MD;  Location: [facility] OR;  Service: Neurosurgery;  Laterality: Right;   BACK SURGERY  2011   CATARACT EXTRACTION Bilateral    2024   CORONARY ANGIOPLASTY WITH STENT PLACEMENT     CRANIOTOMY Right 09/14/2023   Procedure: STEREOTACTIC RIGHT TEMPRO-OCCIPITAL CRANIOTOMY FOR RESECTION OF TUMOR;  Surgeon: Joel Pupa, MD;  Location: [facility] OR;  Service: Neurosurgery;  Laterality: Right;   LEFT HEART CATHETERIZATION WITH CORONARY ANGIOGRAM N/A 07/29/2011   Procedure: LEFT HEART CATHETERIZATION WITH CORONARY ANGIOGRAM;  Surgeon: Joel JONETTA Cash, MD;  Location: [facility] CATH LAB;  Service: Cardiovascular;  Laterality: N/A;   LEFT HEART CATHETERIZATION WITH CORONARY ANGIOGRAM N/A 06/14/2013   Procedure: LEFT HEART CATHETERIZATION WITH CORONARY ANGIOGRAM;  Surgeon: Joel JONETTA Cash, MD;  Location: [facility] CATH LAB;  Service: Cardiovascular;  Laterality: N/A;    RADIOLOGY WITH ANESTHESIA N/A 06/09/2022   Procedure: MRI WITH  ANESTHESIA OF LUMBAR SPINE WITHOUT CONTRAST;  Surgeon: Radiologist, Medication, MD;  Location: [facility] OR;  Service: Radiology;  Laterality: N/A;    FAMILY HISTORY: family history includes Angina in his father; Dementia in his mother; Diabetes in his father; Heart disease in his father.  SOCIAL HISTORY:  reports that he quit smoking about 21 years ago. His smoking use included cigarettes. He started smoking about 74 years ago. He has a 106 pack-year smoking history. He has never used smokeless tobacco. He reports that he does not currently use alcohol. He reports that he does not use drugs.  ALLERGIES: Invokana [canagliflozin] and Trulicity [dulaglutide]  MEDICATIONS:  Current Outpatient Medications  Medication Sig Dispense Refill   acetaminophen  (TYLENOL ) 325 MG tablet Take 1 tablet (325 mg total) by mouth every 4 (four) hours as needed for mild pain (pain score 1-3) (temp > 100.5).     albuterol  (VENTOLIN  HFA) 108 (90 Base) MCG/ACT inhaler Inhale 1-2 puffs into the lungs every 6 (six) hours as needed for wheezing or shortness of breath. 6.7 g 0   amLODipine  (NORVASC ) 10 MG tablet Take 1 tablet (10 mg total) by mouth at bedtime. 30 tablet 0   Ascorbic Acid  (VITAMIN C ) 1000 MG tablet Take 1 tablet (1,000 mg total) by mouth in the morning and at bedtime. 30 tablet 0   B-D ULTRAFINE III SHORT PEN 31G X 8 MM MISC Inject 1 Syringe into the skin daily.     BIOTIN PO Take 1 tablet by mouth every evening.     butalbital -acetaminophen -caffeine  (FIORICET ) 50-325-40 MG tablet Take 1 tablet by mouth every 4 (four) hours as needed for headache. 14 tablet 0   Cholecalciferol  (VITAMIN D3) 125 MCG (5000 UT) TABS Take 1 tablet (5,000 Units total) by mouth in the morning. 30 tablet 0   Continuous Glucose Sensor (FREESTYLE LIBRE 3 PLUS SENSOR) MISC apply to upper back of arm for 90 days change sensor every 15 days IC-10 CODE: E11.9     insulin  aspart  (NOVOLOG ) 100 UNIT/ML injection Inject 0-20 Units into the skin 4 (four) times daily -  before meals and at bedtime. CBG 70 - 120: 0 units CBG 121 - 150: 3 units CBG 151 - 200: 4 units CBG 201 - 250: 7 units CBG 251 - 300: 11 units CBG 301 - 350: 15 units CBG 351 - 400: 20 units CBG > 400: call MD and obtain STAT lab verification     insulin  aspart (NOVOLOG ) 100 UNIT/ML injection Inject 5 Units into the skin with breakfast, with lunch, and with evening meal.     irbesartan  (AVAPRO ) 150 MG tablet Take 1 tablet (150 mg total) by mouth daily. 30 tablet 0   loratadine  (CLARITIN ) 10 MG tablet Take 1 tablet (10 mg total) by mouth daily. 30 tablet 0   metFORMIN  (GLUCOPHAGE -XR) 500 MG 24 hr tablet Take 2 tablets (1,000 mg total) by mouth 2 (two) times daily. 120 tablet 0   metoprolol  succinate (TOPROL -XL) 50 MG 24 hr tablet Take 1 tablet (50 mg total) by mouth daily. Take with or immediately following a meal. 30 tablet 0   Multiple Vitamins-Minerals (PRESERVISION AREDS 2 PO) Take 2 capsules by mouth at bedtime.     [START ON 10/14/2023] ondansetron  (ZOFRAN ) 8 MG tablet Take 1 tablet (8 mg total) by mouth every 8 (eight) hours as needed for nausea or vomiting. May take 30-60 minutes prior to Temodar  administration if nausea/vomiting occurs as needed. 30 tablet 1   pantoprazole  (PROTONIX ) 40  MG tablet Take 1 tablet (40 mg total) by mouth at bedtime. 30 tablet 0   polyethylene glycol (MIRALAX  / GLYCOLAX ) 17 g packet Take 17 g by mouth daily as needed.     senna-docusate (SENOKOT-S) 8.6-50 MG tablet Take 1-2 tablets by mouth 2 (two) times daily between meals as needed for mild constipation or moderate constipation.     simvastatin  (ZOCOR ) 20 MG tablet Take 1 tablet (20 mg total) by mouth daily. 30 tablet 0   [START ON 10/14/2023] temozolomide  (TEMODAR ) 140 MG capsule Take 1 capsule (140 mg total) by mouth daily. (Take with ONE 20 mg capsule for total daily dose of 160 mg). May take on an empty stomach to decrease  nausea & vomiting. 42 capsule 0   [START ON 10/14/2023] temozolomide  (TEMODAR ) 20 MG capsule Take 1 capsule (20 mg total) by mouth daily. (Take with ONE 140 mg capsule for total daily dose of 160 mg). May take on an empty stomach to decrease nausea & vomiting. 42 capsule 0   topiramate  (TOPAMAX ) 25 MG tablet Take 1 tablet (25 mg total) by mouth 2 (two) times daily. 60 tablet 1   vitamin E  180 MG (400 UNITS) capsule Take 1 capsule (400 Units total) by mouth in the morning. 30 capsule 0   Zinc  Sulfate 220 (50 Zn) MG TABS Take 1 tablet (220 mg total) by mouth at bedtime. 30 tablet 0   No current facility-administered medications for this encounter.    REVIEW OF SYSTEMS:  As above.   PHYSICAL EXAM:  vitals were not taken for this visit.   General: Alert and oriented, in no acute distress *** HEENT: Head is normocephalic. Extraocular movements are intact. Oropharynx is clear. Neck: Neck is supple, no palpable cervical or supraclavicular lymphadenopathy. Heart: Regular in rate and rhythm with no murmurs, rubs, or gallops. Chest: Clear to auscultation bilaterally, with no rhonchi, wheezes, or rales. Abdomen: Soft, nontender, nondistended, with no rigidity or guarding. Extremities: No cyanosis or edema. Lymphatics: see Neck Exam Skin: No concerning lesions. Musculoskeletal: symmetric strength and muscle tone throughout. Neurologic: Cranial nerves II through XII are grossly intact. No obvious focalities. Speech is fluent. Coordination is intact. Psychiatric: Judgment and insight are intact. Affect is appropriate.   LABORATORY DATA:  Lab Results  Component Value Date   WBC 4.4 09/20/2023   HGB 13.5 09/20/2023   HCT 39.6 09/20/2023   MCV 83.0 09/20/2023   PLT 198 09/20/2023   CMP     Component Value Date/Time   NA 137 09/24/2023 1039   NA 137 07/16/2017 0000   K 3.7 09/24/2023 1039   CL 103 09/24/2023 1039   CO2 25 09/24/2023 1039   GLUCOSE 148 (H) 09/24/2023 1039   BUN 8 09/24/2023  1039   BUN 16 07/16/2017 0000   CREATININE 0.87 09/24/2023 1039   CREATININE 0.72 02/24/2016 0806   CALCIUM 8.9 09/24/2023 1039   PROT 6.5 09/20/2023 0500   PROT 6.8 08/19/2023 0940   ALBUMIN 3.1 (L) 09/20/2023 0500   AST 20 09/20/2023 0500   ALT 16 09/20/2023 0500   ALKPHOS 45 09/20/2023 0500   BILITOT 1.1 09/20/2023 0500   GFR 90.53 08/19/2023 0940   GFRNONAA >60 09/24/2023 1039         RADIOGRAPHY: CT HEAD WO CONTRAST ( ) Result Date: 09/19/2023 CLINICAL DATA:  73 year old male with headache, status post resection of cystic and solid right temporal lobe mass postoperative day 5. EXAM: CT HEAD WITHOUT CONTRAST TECHNIQUE: Contiguous axial images were  obtained from the base of the skull through the vertex without intravenous contrast. RADIATION DOSE REDUCTION: This exam was performed according to the departmental dose-optimization program which includes automated exposure control, adjustment of the mA and/or kV according to patient size and/or use of iterative reconstruction technique. COMPARISON:  Postoperative brain MRI 09/15/2023. FINDINGS: Brain: Small volume postoperative pneumocephalus. Right temporal lobe resection cavity with heterogeneous mixed density fluid and small volume postoperative gas. Small volume intraventricular hemorrhage not apparent now. Small volume postoperative subdural collection and/or dural repair. Stable ventricle size and configuration. No significant midline shift. Basilar cisterns are patent. Gray-white differentiation elsewhere within normal limits. Vascular: Calcified atherosclerosis at the skull base. No suspicious intracranial vascular hyperdensity. Skull: Posterior right craniotomy. Sinuses/Orbits: Partial opacified right tympanic cavity and mastoid status post craniotomy closely abutting the right mastoid air cells. Other paranasal sinuses and mastoids are well aerated. Other: Postoperative changes to the right scalp. Skin staples in place. Negative orbits  soft tissues. IMPRESSION: 1. Recent resection of Right temporal lobe mass, stable from postoperative MRI. 2. No new intracranial abnormality identified. Electronically Signed   By: VEAR Hurst M.D.   On: 09/19/2023 10:56   MR BRAIN W WO CONTRAST Result Date: 09/15/2023 CLINICAL DATA:  73 year old male with a dominant cystic and solid enhancing mass of the right hemisphere occupying the posterior right temporal lobe, associated regional petechial and abnormal right temporal horn ependymal enhancement. Postoperative day 1 status post stereotactic craniotomy and resection of tumor. Preoperative EXAM: MRI HEAD WITHOUT AND WITH CONTRAST TECHNIQUE: Multiplanar, multiecho pulse sequences of the brain and surrounding structures were obtained without and with intravenous contrast. CONTRAST:  10mL GADAVIST  GADOBUTROL  1 MMOL/ML IV SOLN COMPARISON:  MRI 09/02/2023, 08/30/2023. FINDINGS: Brain: Right side craniotomy now and cystic resection cavity of the posterior right temporal lobe at the epicenter of previously demonstrated combined cystic and solid tumor (series 10, image 10). Small volume right hemisphere pneumocephalus. Small volume extra-axial fluid collection in the right hemisphere and underlying the craniotomy flap. Patchy diffusion restriction along the margins of the resection, especially posteriorly (series 5, image 72). Small scattered T1 hyperintense blood products also along the margins. Following contrast small enhancing satellite tumor nodule measuring 5 mm redemonstrated posterior to the resection site (series 18, image 7). And there is also a small volume of patchy enhancement along the posterosuperior resection cavity best seen on series 18, image 7. The punctate and petechial mesial right temporal lobe enhancement near the temporal horn is less apparent now (series 16, image 23) and the right temporal horn ependymal enhancement seems resolved. As before there is little regional T2 and FLAIR hyperintensity  beyond the tumor epicenter. And as on the preoperative scan there is no midline shift or significant intracranial mass effect. Trace intraventricular blood layering in the atria. No ventriculomegaly. No superimposed restricted diffusion suggestive of acute infarction. No new or distant enhancement is identified. Cervicomedullary junction and pituitary are within normal limits. Basilar cisterns are within normal limits. Vascular: Major intracranial vascular flow voids are stable. Following contrast the major dural venous sinuses are enhancing and appear to be patent. Skull and upper cervical spine: Right side craniotomy. Background bone marrow signal within normal limits. Negative visible cervical spine and spinal cord. Sinuses/Orbits: Stable and negative. Other: Right mastoid effusion is new and appears to be postoperative in nature. Grossly negative other visible internal auditory structures. IMPRESSION: 1. Resection of heterogeneous 5.6 cm tumor occupying the posterior right temporal lobe. Patchy ischemia, mild residual enhancement most pronounced along the  posterior resection cavity. And unchanged small 5 mm enhancing tumor nodule posterior to the tumor site. Right temporal horn ependymal and mesial temporal lobe petechial enhancement not apparent now. 2. Other expected postoperative findings in the right hemisphere. Electronically Signed   By: VEAR Hurst M.D.   On: 09/15/2023 04:56      IMPRESSION/PLAN: This is a very pleasant *** year old *** with metastatic disease to the brain.  I had a lengthy discussion with the patient after reviewing their MRI results with them.  We spoke about whole brain radiotherapy versus stereotactic radiosurgery to the brain. We spoke about the differing risks benefits and side effects of both of these treatments. During part of our discussion, we spoke about the hair loss, fatigue and cognitive effects that can result from whole brain radiotherapy.  Additionally, we spoke about  radionecrosis that can result from stereotactic radiosurgery. I explained that whole brain radiotherapy is more comprehensive and therefore can decrease the chance of recurrences elsewhere in the brain, while stereotactic radiosurgery only treats the areas of gross disease while sparing the rest of the brain parenchyma.  After lengthy discussion, the patient would like to proceed with stereotactic brain radiosurgery to their metastatic disease. They will meet with neurosurgery in the near future to discuss this further; a neurosurgeon will participate in their case.  CT simulation will take place on *** and treatment on ***.  I plan to deliver *** Gy in 1 fraction to ***.  On date of service, in total, I spent *** minutes on this encounter. Patient was seen in person.   __________________________________________   Lauraine Golden, MD  This document serves as a record of services personally performed by Lauraine Golden, MD. It was created on her behalf by Reymundo Cartwright, a trained medical scribe. The creation of this record is based on the scribe's personal observations and the provider's statements to them. This document has been checked and approved by the attending provider.

## 2023-10-11 NOTE — Therapy (Signed)
 OUTPATIENT OCCUPATIONAL THERAPY NEURO EVALUATION  Patient Name: Joel Williams MRN: [id number] DOB:[birth year], 73 y.o., male Today's Date: 10/11/2023  PCP: [name], MD REFERRING PROVIDER: Pegge Toribio PARAS, PA-C  END OF SESSION:  OT End of Session - 10/11/23 1504     Visit Number 1    Number of Visits 13    Date for OT Re-Evaluation 11/26/23    Authorization Type Medicare A&B / [employer]    OT Start Time 1406    OT Stop Time 1452    OT Time Calculation (min) 46 min          Past Medical History:  Diagnosis Date   Allergic rhinitis    Arthritis    ASTHMA 06/08/2008   Asthma    as a child   CAD (coronary artery disease)    a. s/p Promus DES (3.0 x 16 mm) to mid LAD 05/16/10;  b. Myoview  05/15/10: anteroseptal ischemia with EF 52%;   c. Cath 05/16/10: single vessel CAD with mLAD 90% tx with PCI and EF 50%;  d. 07/2011 Cath:  Patent stent, nonobs dzs, NL EF.   Chronic kidney disease    was seen by Dr. Lonna and released.   Colonic polyp    DIABETES MELLITUS, TYPE II 06/08/2008   GERD (gastroesophageal reflux disease)    Hemorrhoids    HYPERLIPIDEMIA 06/08/2008   HYPERTENSION 06/08/2008   Impotence of organic origin 08/24/2008   Myocardial infarction (HCC) 04/16/2010   Pneumonia    hx of   Renal vein thrombosis (HCC)    Seasonal allergies    Past Surgical History:  Procedure Laterality Date   APPLICATION OF CRANIAL NAVIGATION Right 09/14/2023   Procedure: COMPUTER-ASSISTED NAVIGATION, FOR CRANIAL PROCEDURE;  Surgeon: Lanis Pupa, MD;  Location: [facility] OR;  Service: Neurosurgery;  Laterality: Right;   BACK SURGERY  2011   CATARACT EXTRACTION Bilateral    2024   CORONARY ANGIOPLASTY WITH STENT PLACEMENT     CRANIOTOMY Right 09/14/2023   Procedure: STEREOTACTIC RIGHT TEMPRO-OCCIPITAL CRANIOTOMY FOR RESECTION OF TUMOR;  Surgeon: Lanis Pupa, MD;  Location: [facility] OR;  Service: Neurosurgery;  Laterality: Right;   LEFT HEART CATHETERIZATION WITH  CORONARY ANGIOGRAM N/A 07/29/2011   Procedure: LEFT HEART CATHETERIZATION WITH CORONARY ANGIOGRAM;  Surgeon: Lonni JONETTA Cash, MD;  Location: [facility] CATH LAB;  Service: Cardiovascular;  Laterality: N/A;   LEFT HEART CATHETERIZATION WITH CORONARY ANGIOGRAM N/A 06/14/2013   Procedure: LEFT HEART CATHETERIZATION WITH CORONARY ANGIOGRAM;  Surgeon: Lonni JONETTA Cash, MD;  Location: [facility] CATH LAB;  Service: Cardiovascular;  Laterality: N/A;   RADIOLOGY WITH ANESTHESIA N/A 06/09/2022   Procedure: MRI WITH ANESTHESIA OF LUMBAR SPINE WITHOUT CONTRAST;  Surgeon: Radiologist, Medication, MD;  Location: [facility] OR;  Service: Radiology;  Laterality: N/A;   Patient Active Problem List   Diagnosis Date Noted   Glioblastoma, IDH-wildtype (HCC) 09/17/2023   Status post craniotomy 09/14/2023   Brain tumor (HCC) 09/14/2023   Headache 08/23/2023   Lower urinary tract symptoms (LUTS) 08/19/2023   Obesity with body mass index (BMI) of 30.0 to 39.9 07/08/2017   Chronic diastolic CHF (congestive heart failure) (HCC) 07/07/2017   CAD (coronary artery disease)    Unintentional weight loss 08/23/2008   History of colonic polyps 08/23/2008   Diabetes mellitus without complication (HCC) 06/08/2008   Hyperlipidemia 06/08/2008   Essential hypertension 06/08/2008   Asthma 06/08/2008    ONSET DATE: referral date 09/22/23  REFERRING DIAG: D49.6 (ICD-10-CM) - Brain tumor (HCC)  THERAPY DIAG:  Muscle weakness (generalized)  Other symptoms and signs involving cognitive functions following other cerebrovascular disease  Visuospatial deficit  Unsteadiness on feet  Rationale for Evaluation and Treatment: Rehabilitation  SUBJECTIVE:   SUBJECTIVE STATEMENT: Pt reports everything happened so suddenly.  Pt reports feeling prepared to take care of his self-care routines after d/c from [facility] Rehab.  Pt reports that his spouse and daughters assist with medications and setting him up.  Pt reports that he would enjoy doing the  yard work before, but has not resumed doing those things.  I am having trouble, maybe focusing, on reading - the comprehension.  Pt reports lacking the initiation and motivation to get started on the reading. Pt accompanied by: self and significant other (spouse, [name])  PERTINENT HISTORY: 73 y.o. male who presented 09/14/23 for stereotactic R temporo-occipital craniotomy for resection of large R temporal tumor. Postop MRI reviewed and demonstrates near complete resection of tumor.PMH: arthritis, asthma, CAD, CKD, DM2, GERD, HLD, HTN, MI 2012, back surgery 2011, renal vein thrombosis   PRECAUTIONS: Fall and Other: no lifting >5-10#  WEIGHT BEARING RESTRICTIONS: No  PAIN:  Are you having pain? Yes: NPRS scale: 6/10 Pain location: head at surgery site Pain description: numbness, dull Aggravating factors: unsure Relieving factors: pain meds  FALLS: Has patient fallen in last 6 months? 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  HAND DOMINANCE: Right  ADLs: Overall ADLs: Mod I, uses shower chair in bathroom for energy conservation when shaving and getting dressed Transfers/ambulation related to ADLs: RW occasionally in home, Rollator in the community for balance and endurance  IADLs: Light housekeeping: wife completes primarily Meal Prep: wife completes primarily, pt would grill before Community mobility: not cleared to drive,  would like to return when safe Medication management: wife and daughters administer meds Financial management: daughter is currently managing finances Handwriting: reports poor penmanship prior  MOBILITY STATUS: Needs Assist: uses Rollator outside or in the community for balance and endurance  POSTURE COMMENTS:  rounded shoulders and forward head  ACTIVITY TOLERANCE: Activity tolerance: fatigues quickly; will use Rollator outside or in the community for balance and endurance  FUNCTIONAL OUTCOME MEASURES: PSFS: 1.8    UPPER EXTREMITY ROM:    Active ROM Right eval Left eval  Shoulder flexion 150 (shoulder pain with overhead reach) [facility]  Shoulder abduction [facility]  Shoulder adduction    Shoulder extension    Shoulder internal rotation 80% WFL  Shoulder external rotation [facility] WFL  Elbow flexion    Elbow extension    Wrist flexion    Wrist extension    Wrist ulnar deviation    Wrist radial deviation    Wrist pronation    Wrist supination    (Blank rows = not tested)  UPPER EXTREMITY MMT:   grossly 4/5 overall  HAND FUNCTION: Grip strength: Right: 55, 55, 48 (average 52.67) lbs; Left: 55, 57, 53 (average 55) lbs  COORDINATION: 9 Hole Peg  test: Right: 35.22 sec; Left: 35.50 sec Alternating pronation/supination: B mild dysdiadochokinesia   SENSATION: WFL, reports mild decreased sensation in finger tips but not different  COGNITION: Overall cognitive status: Within functional limits for tasks assessed; reports decreased sequencing and organization with finances and reading for Bible study  VISION: Subjective report: wears glasses Baseline vision: Wears glasses all the time Visual history: cataracts, removal ~1 year ago  VISION ASSESSMENT: Ocular ROM: WFL Gaze preference/alignment: chin down Tracking/Visual pursuits: Able to track stimulus in all quads without difficulty Saccades: WFL Visual Fields: Left visual field deficits During confrontation testing, pt does  not recognize visual stimuli in L upper quadrant  Patient has difficulty with following activities due to following visual impairments: reading small print  PERCEPTION: Impaired: mild                                                                                                                            TREATMENT DATE:  10/11/23 Educated on role and purpose of OT as well as potential interventions and goals for therapy based on initial evaluation findings. Educated on functional impairments of L visual field impairment, in L upper quadrant.    PATIENT EDUCATION: Education details: Educated on role and purpose of OT as well as potential interventions and goals for therapy based on initial evaluation findings. Person educated: Patient and Spouse Education method: Explanation Education comprehension: verbalized understanding and needs further education  HOME EXERCISE PROGRAM: TBD   GOALS: Goals reviewed with patient? Yes  SHORT TERM GOALS: Target date: 11/05/23  Pt will be independent in vision HEP. Baseline: new to OPOT Goal status: INITIAL  2.  Pt will independently recall at least 3 energy conservation strategies.  Baseline: decreased endurance Goal status: INITIAL  3.  Pt will independently recall or demo at least 3 vision compensation strategies.  Baseline: L visual field impairment Goal status: INITIAL   LONG TERM GOALS: Target date: 11/26/23  Pt will understand return to driving recommendations and/or rationale to continue to abstain from driving. Baseline: desire to return to driving Goal status: INITIAL  2.  Pt will demonstrate activity tolerance for 10-15 mins with 1 or fewer rest breaks, incorporating bending, to allow for safe return to aspects of yard work. Baseline: desire to return to yard work Goal status: INITIAL  3.  Pt will be able to attend to reading for 5 mins and answer questions with 80% accuracy. Baseline: difficulty with reading small print,  impaired focus/attention Goal status: INITIAL  4.  Patient will demonstrate at least 90% accuracy with environmental visual scanning during mobility to assist with ADLs and IADLs  Baseline:  Goal status: INITIAL  5.  Pt will demonstrate improved awareness and sequencing to complete pill box assessment in <5 mins with 1 or fewer errors to demonstrate improved ability to manage medications.  Baseline:  Goal status: INITIAL  6.  Patient will report at least two-point increase in average PSFS score or at least  three-point increase in a single activity score indicating functionally significant improvement given minimum detectable change. Baseline: 1.8 Goal status: INITIAL  ASSESSMENT:  CLINICAL IMPRESSION: Patient is a 73 y.o. male who was seen today for occupational therapy evaluation for impairments s/p brain tumor resection. Pt reports decreased activity tolerance, endurance, and impaired initiation, sequencing, organization as needed for reading, Bible study planning, excel spreadsheets, and finances.  Pt also with L visual field impairment. Pt currently lives with spouse, spouse and adult daughters currently assisting with medication management, pt was very active with tasks in his church. Pt will benefit from skilled occupational therapy services to address pain management, altered sensation, balance, GM/FM control, cognition, safety awareness, introduction of compensatory strategies/AE prn, visual-perception, and implementation of an HEP to improve participation and safety during ADLs and IADLs.    PERFORMANCE DEFICITS: in functional skills including IADLs, pain, balance, body mechanics, endurance, decreased knowledge of precautions, decreased knowledge of use of DME, and vision, cognitive skills including attention, energy/drive, and sequencing, and psychosocial skills including routines and behaviors.   IMPAIRMENTS: are limiting patient from IADLs and leisure.   CO-MORBIDITIES: may have  co-morbidities  that affects occupational performance. Patient will benefit from skilled OT to address above impairments and improve overall function.  MODIFICATION OR ASSISTANCE TO COMPLETE EVALUATION: Min-Moderate modification of tasks or assist with assess necessary to complete an evaluation.  OT OCCUPATIONAL PROFILE AND HISTORY: Detailed assessment: Review of records and additional review of physical, cognitive, psychosocial history related to current functional performance.  CLINICAL DECISION MAKING: Moderate - several treatment options, min-mod task modification necessary  REHAB POTENTIAL: Good  EVALUATION COMPLEXITY: Moderate    PLAN:  OT FREQUENCY: 1-2x/week  OT DURATION: 6 weeks  PLANNED INTERVENTIONS: 97168 OT Re-evaluation, 97535 self care/ADL training, 02889 therapeutic exercise, 97530 therapeutic activity, 97112 neuromuscular re-education, functional mobility training, visual/perceptual remediation/compensation, psychosocial skills training, energy conservation, coping strategies training, patient/family education, and DME and/or AE instructions  RECOMMENDED OTHER SERVICES: NA  CONSULTED AND AGREED WITH PLAN OF CARE: Patient and family member/caregiver  PLAN FOR NEXT SESSION: Engage in dual tasking, Complete pill box assessment, Table top tasks with sequencing, memory, etc   Tyasia Packard, OTR/L 10/11/2023, 3:07 PM   [facility] [address], [address] [place], [place] [address] Phone # [phone] Fax # [phone]

## 2023-10-12 ENCOUNTER — Encounter: Payer: Self-pay | Admitting: Radiation Oncology

## 2023-10-12 ENCOUNTER — Ambulatory Visit: Admitting: Radiation Oncology

## 2023-10-12 ENCOUNTER — Ambulatory Visit
Admission: RE | Admit: 2023-10-12 | Discharge: 2023-10-12 | Disposition: A | Discharge status: Home / Self Care | Source: Ambulatory Visit | Attending: Radiation Oncology | Admitting: Radiation Oncology

## 2023-10-12 ENCOUNTER — Inpatient Hospital Stay: Admitting: Licensed Clinical Social Worker

## 2023-10-12 VITALS — BP 143/77 | HR 68 | Temp 97.3°F | Resp 20 | Ht 69.0 in | Wt 219.6 lb

## 2023-10-12 DIAGNOSIS — Z79630 Long term (current) use of alkylating agent: Secondary | ICD-10-CM | POA: Diagnosis not present

## 2023-10-12 DIAGNOSIS — Z86718 Personal history of other venous thrombosis and embolism: Secondary | ICD-10-CM | POA: Diagnosis not present

## 2023-10-12 DIAGNOSIS — Z7984 Long term (current) use of oral hypoglycemic drugs: Secondary | ICD-10-CM | POA: Insufficient documentation

## 2023-10-12 DIAGNOSIS — I252 Old myocardial infarction: Secondary | ICD-10-CM | POA: Diagnosis not present

## 2023-10-12 DIAGNOSIS — Z794 Long term (current) use of insulin: Secondary | ICD-10-CM | POA: Insufficient documentation

## 2023-10-12 DIAGNOSIS — Z8719 Personal history of other diseases of the digestive system: Secondary | ICD-10-CM | POA: Diagnosis not present

## 2023-10-12 DIAGNOSIS — Z860100 Personal history of colon polyps, unspecified: Secondary | ICD-10-CM | POA: Diagnosis not present

## 2023-10-12 DIAGNOSIS — Z87891 Personal history of nicotine dependence: Secondary | ICD-10-CM | POA: Insufficient documentation

## 2023-10-12 DIAGNOSIS — C712 Malignant neoplasm of temporal lobe: Secondary | ICD-10-CM | POA: Diagnosis not present

## 2023-10-12 DIAGNOSIS — J45909 Unspecified asthma, uncomplicated: Secondary | ICD-10-CM | POA: Insufficient documentation

## 2023-10-12 DIAGNOSIS — Z79899 Other long term (current) drug therapy: Secondary | ICD-10-CM | POA: Diagnosis not present

## 2023-10-12 DIAGNOSIS — I129 Hypertensive chronic kidney disease with stage 1 through stage 4 chronic kidney disease, or unspecified chronic kidney disease: Secondary | ICD-10-CM | POA: Diagnosis not present

## 2023-10-12 DIAGNOSIS — E785 Hyperlipidemia, unspecified: Secondary | ICD-10-CM | POA: Diagnosis not present

## 2023-10-12 DIAGNOSIS — I2510 Atherosclerotic heart disease of native coronary artery without angina pectoris: Secondary | ICD-10-CM | POA: Insufficient documentation

## 2023-10-12 DIAGNOSIS — C719 Malignant neoplasm of brain, unspecified: Secondary | ICD-10-CM

## 2023-10-12 MED ORDER — LORAZEPAM 1 MG PO TABS: 1.0000 mg | ORAL_TABLET | ORAL | 0 refills | Status: DC | PRN

## 2023-10-12 NOTE — Progress Notes (Signed)
 Location/Histology of Brain Tumor:  Right Temporal Glioblastoma  Patient presented with symptoms of:   Confusion, fatigue, lethargy and visual complaints  MRI Brain W WO Contrast  Past or anticipated interventions, if any, per neurosurgery:  09/16/2023 Lanis, MD  Craniotomy  Past or anticipated interventions, if any, per medical oncology:   Dose of Decadron , if applicable: None  Recent neurologic symptoms, if any:  Seizures: None Headaches: Yes, has headaches everyday Nausea: None Dizziness/ataxia: None Difficulty with hand coordination: None Focal numbness/weakness: None Visual deficits/changes: None Confusion/Memory deficits: None  Painful bone metastases at present, if any: None  SAFETY ISSUES: Prior radiation? None  Pacemaker/ICD? None Possible current pregnancy? None Is the patient on methotrexate? None  Additional Complaints / other details: None BP (!) 143/77 (BP Location: Right Arm, Patient Position: Sitting, Cuff Size: Large)   Pulse 68   Temp (!) 97.3 F (36.3 C)   Resp 20   Ht 5' 9 (1.753 m)   Wt 219 lb 9.6 oz (99.6 kg)   SpO2 97%   BMI 32.43 kg/m   Wt Readings from Last 3 Encounters:  10/12/23 219 lb 9.6 oz (99.6 kg)  10/07/23 217 lb 7 oz (98.6 kg)  10/06/23 216 lb 9.6 oz (98.2 kg)

## 2023-10-13 ENCOUNTER — Ambulatory Visit
Admission: RE | Admit: 2023-10-13 | Discharge: 2023-10-13 | Disposition: A | Discharge status: Home / Self Care | Source: Ambulatory Visit | Attending: Radiation Oncology | Admitting: Radiation Oncology

## 2023-10-13 ENCOUNTER — Encounter: Payer: Self-pay | Admitting: Internal Medicine

## 2023-10-13 ENCOUNTER — Other Ambulatory Visit (HOSPITAL_COMMUNITY): Payer: Self-pay

## 2023-10-13 ENCOUNTER — Other Ambulatory Visit: Payer: Self-pay

## 2023-10-13 DIAGNOSIS — C712 Malignant neoplasm of temporal lobe: Secondary | ICD-10-CM | POA: Insufficient documentation

## 2023-10-13 NOTE — Progress Notes (Signed)
 Specialty Pharmacy Initial Fill Coordination Note  Joel Williams is a 73 y.o. male contacted today regarding refills of specialty medication(s) Temozolomide  (TEMODAR ) .  Patient requested Delivery  on 10/19/23  to verified address [address]   [facility] [facility] [place] [address] (Patient requests a call when delivering)   Medication will be filled on 10/18/23.   Patient is aware of $0 copayment.   Lucie Lamer, CPhT [place]  [facility] Services Pharmacy Technician Patient Advocate Specialist II [facility] [place] Phone: [phone]  Fax: [phone] [email or web address]

## 2023-10-13 NOTE — Progress Notes (Signed)
 Oral Chemotherapy Pharmacist Encounter  Patient was counseled under telephone encounter from 10/07/23.  Joel Williams, PharmD, BCPS, BCOP Hematology/Oncology Clinical Pharmacist [facility] and [facility] Oral Chemotherapy Navigation Clinics [phone] 10/13/2023 2:25 PM

## 2023-10-13 NOTE — Progress Notes (Signed)
 Brain Tumor Assessment   Clinical social worker met with patient / caregiver (wife and daughter) to complete assessments.      Patient distress screen score:     10/13/2023    2:00 PM  ONCBCN DISTRESS SCREENING  Screening Type Initial Screening  How much distress have you been experiencing in the past week? (0-10) 3   Caregiver distress screen score: 6 Significant physical limits/changes identified:  pt unable to drive at this time which causes him significant distress       MOCA score:     10/13/2023    3:51 PM  Montreal Cognitive Assessment   Visuospatial/ Executive (0/5) 4  Naming (0/3) 3  Attention: Read list of digits (0/2) 2  Attention: Read list of letters (0/1) 1  Attention: Serial 7 subtraction starting at 100 (0/3) 3  Language: Repeat phrase (0/2) 1  Language : Fluency (0/1) 0  Abstraction (0/2) 2  Delayed Recall (0/5) 3  Orientation (0/6) 6  Total 25     WHO Quality of Life scores:   Overall quality of life: 60   Physical health: 82   Psychological: 83   Social relationship: 67   Environment: 73 Significant findings: fatigue due to sleep disturbance and ability to get around lowered score for physical health  PATH2Caregiving score: [address]     Devere JONELLE Manna, LCSW

## 2023-10-13 NOTE — Telephone Encounter (Signed)
 Oral Chemotherapy Pharmacist Encounter  I spoke with patient's wife, Joel Williams, for overview of: Temodar  (temozolomide ) for the treatment of glioblastoma multiforme in conjunction with radiation, planned duration concomitant phase 42 days of therapy.   Counseled on administration, dosing, side effects, monitoring, drug-food interactions, safe handling, storage, and disposal.  Patient will take Temodar  140mg  capsules and Temodar  20mg  capsules, 160 mg total daily dose, by mouth once daily, may take at bedtime and on an empty stomach to decrease nausea and vomiting.  Patient will take Temodar  concurrent with radiation for 42 days straight.  Temodar  start date: 10/25/23 PM Radiation start date: 10/26/23  Adverse effects include but are not limited to: nausea, vomiting, GI upset, rash, and fatigue.  Nausea/Vomiting PPX: Prophylactic Zofran  will not be used at initiation of concurrent phase, but will be initiated if nausea develops despite Temodar  administration on an empty stomach and at bedtime. If this occurs, patient will take Zofran  8 mg tablet, 1 tablet by mouth 30-60 min prior to Temodar  dose to help decrease N/V   Reviewed importance of keeping a medication schedule and plan for any missed doses. No barriers to medication adherence identified.  Medication reconciliation performed and medication/allergy list updated.  All questions answered.  Distress thermometer completed during telephone call and reviewed with patient. Due to score, social work referral has not been sent.  Patient's wife voiced understanding and appreciation.   Medication education handout placed in mail for patient. Patient's wife knows to call the office with questions or concerns. Oral Chemotherapy Clinic phone number provided.   [name], PharmD, BCPS, BCOP Hematology/Oncology Clinical Pharmacist [phone] 10/13/2023 2:18 PM

## 2023-10-14 ENCOUNTER — Encounter: Payer: Self-pay | Admitting: *Deleted

## 2023-10-14 NOTE — Progress Notes (Signed)
 Temodar  ordered Radiation oncology start date 10/26/2023 Radiation oncology end date 12/07/2023  Requested lab & MD visits with Dr Buckley around weeks 2, 4, and 6.  Scheduling request sent on 10/14/23.

## 2023-10-15 ENCOUNTER — Ambulatory Visit
Admission: RE | Admit: 2023-10-15 | Discharge: 2023-10-15 | Disposition: A | Discharge status: Home / Self Care | Source: Ambulatory Visit | Attending: Radiation Oncology | Admitting: Radiation Oncology

## 2023-10-15 DIAGNOSIS — C719 Malignant neoplasm of brain, unspecified: Secondary | ICD-10-CM | POA: Insufficient documentation

## 2023-10-15 DIAGNOSIS — C712 Malignant neoplasm of temporal lobe: Secondary | ICD-10-CM | POA: Diagnosis not present

## 2023-10-15 DIAGNOSIS — Z510 Encounter for antineoplastic radiation therapy: Secondary | ICD-10-CM | POA: Insufficient documentation

## 2023-10-18 ENCOUNTER — Encounter (HOSPITAL_COMMUNITY): Payer: Self-pay

## 2023-10-18 ENCOUNTER — Other Ambulatory Visit: Payer: Self-pay

## 2023-10-19 ENCOUNTER — Telehealth: Payer: Self-pay | Admitting: *Deleted

## 2023-10-19 NOTE — Telephone Encounter (Signed)
 [facility] Molecular Profiling Requisition, patient demographic sheet, copy of insurance cards, pathology report, office note from 10/07/23 & MRI report faxed to [phone].  Fax confirmation received.

## 2023-10-20 DIAGNOSIS — C712 Malignant neoplasm of temporal lobe: Secondary | ICD-10-CM | POA: Diagnosis not present

## 2023-10-21 ENCOUNTER — Ambulatory Visit: Admitting: Physical Therapy

## 2023-10-21 ENCOUNTER — Ambulatory Visit: Attending: Physician Assistant | Admitting: Occupational Therapy

## 2023-10-21 ENCOUNTER — Encounter: Payer: Self-pay | Admitting: Physical Therapy

## 2023-10-21 DIAGNOSIS — R2681 Unsteadiness on feet: Secondary | ICD-10-CM | POA: Insufficient documentation

## 2023-10-21 DIAGNOSIS — M6281 Muscle weakness (generalized): Secondary | ICD-10-CM | POA: Diagnosis not present

## 2023-10-21 DIAGNOSIS — I69818 Other symptoms and signs involving cognitive functions following other cerebrovascular disease: Secondary | ICD-10-CM | POA: Insufficient documentation

## 2023-10-21 DIAGNOSIS — R41842 Visuospatial deficit: Secondary | ICD-10-CM | POA: Insufficient documentation

## 2023-10-21 DIAGNOSIS — R2689 Other abnormalities of gait and mobility: Secondary | ICD-10-CM | POA: Insufficient documentation

## 2023-10-21 NOTE — Therapy (Signed)
 OUTPATIENT PHYSICAL THERAPY NEURO TREATMENT NOTE   Patient Name: Joel Williams MRN: [id number] DOB:[birth year], 73 y.o., male Today's Date: 10/21/2023   PCP: [name], MD  REFERRING PROVIDER: Pegge Toribio PARAS, PA-C  END OF SESSION:  PT End of Session - 10/21/23 0750     Visit Number 2    Number of Visits 17    Date for PT Re-Evaluation 12/01/23    Authorization Type Medicare/MoO    PT Start Time 0846    PT Stop Time 0926    PT Time Calculation (min) 40 min    Equipment Utilized During Treatment Gait belt    Activity Tolerance Patient tolerated treatment well    Behavior During Therapy [facility] for tasks assessed/performed          Past Medical History:  Diagnosis Date   Allergic rhinitis    Arthritis    ASTHMA 06/08/2008   Asthma    as a child   CAD (coronary artery disease)    a. s/p Promus DES (3.0 x 16 mm) to mid LAD 05/16/10;  b. Myoview  05/15/10: anteroseptal ischemia with EF 52%;   c. Cath 05/16/10: single vessel CAD with mLAD 90% tx with PCI and EF 50%;  d. 07/2011 Cath:  Patent stent, nonobs dzs, NL EF.   Chronic kidney disease    was seen by Dr. Lonna and released.   Colonic polyp    DIABETES MELLITUS, TYPE II 06/08/2008   GERD (gastroesophageal reflux disease)    Hemorrhoids    HYPERLIPIDEMIA 06/08/2008   HYPERTENSION 06/08/2008   Impotence of organic origin 08/24/2008   Myocardial infarction (HCC) 04/16/2010   Pneumonia    hx of   Renal vein thrombosis (HCC)    Seasonal allergies    Past Surgical History:  Procedure Laterality Date   APPLICATION OF CRANIAL NAVIGATION Right 09/14/2023   Procedure: COMPUTER-ASSISTED NAVIGATION, FOR CRANIAL PROCEDURE;  Surgeon: Lanis Pupa, MD;  Location: [facility] OR;  Service: Neurosurgery;  Laterality: Right;   BACK SURGERY  2011   CATARACT EXTRACTION Bilateral    2024   CORONARY ANGIOPLASTY WITH STENT PLACEMENT     CRANIOTOMY Right 09/14/2023   Procedure: STEREOTACTIC RIGHT TEMPRO-OCCIPITAL CRANIOTOMY  FOR RESECTION OF TUMOR;  Surgeon: Lanis Pupa, MD;  Location: [facility] OR;  Service: Neurosurgery;  Laterality: Right;   LEFT HEART CATHETERIZATION WITH CORONARY ANGIOGRAM N/A 07/29/2011   Procedure: LEFT HEART CATHETERIZATION WITH CORONARY ANGIOGRAM;  Surgeon: Lonni JONETTA Cash, MD;  Location: [facility] C[facility] LAB;  Service: Cardiovascular;  Laterality: N/A;   LEFT HEART CATHETERIZATION WITH CORONARY ANGIOGRAM N/A 06/14/2013   Procedure: LEFT HEART CATHETERIZATION WITH CORONARY ANGIOGRAM;  Surgeon: Lonni JONETTA Cash, MD;  Location: [facility] CATH LAB;  Service: Cardiovascular;  Laterality: N/A;   RADIOLOGY WITH ANESTHESIA N/A 06/09/2022   Procedure: MRI WITH ANESTHESIA OF LUMBAR SPINE WITHOUT CONTRAST;  Surgeon: Radiologist, Medication, MD;  Location: [facility] OR;  Service: Radiology;  Laterality: N/A;   Patient Active Problem List   Diagnosis Date Noted   Cancer of temporal lobe (HCC) 10/12/2023   Glioblastoma, IDH-wildtype (HCC) 09/17/2023   Status post craniotomy 09/14/2023   Brain tumor (HCC) 09/14/2023   Headache 08/23/2023   Lower urinary tract symptoms (LUTS) 08/19/2023   Obesity with body mass index (BMI) of 30.0 to 39.9 07/08/2017   Chronic diastolic CHF (congestive heart failure) (HCC) 07/07/2017   CAD (coronary artery disease)    Unintentional weight loss 08/23/2008   History of colonic polyps 08/23/2008   Diabetes mellitus without complication (  HCC) 06/08/2008   Hyperlipidemia 06/08/2008   Essential hypertension 06/08/2008   Asthma 06/08/2008    ONSET DATE: 09/14/23  REFERRING DIAG: D49.6 (ICD-10-CM) - Brain tumor (HCC)  THERAPY DIAG:  Muscle weakness (generalized)  Unsteadiness on feet  Other abnormalities of gait and mobility  Rationale for Evaluation and Treatment: Rehabilitation  SUBJECTIVE:                                                                                                                                                                                              SUBJECTIVE STATEMENT: Radiation and chemo are scheduled to start next week.  Radiation will be M-F and chemo will be a pill once/daily 7 days/week.  Balance is what I continue to be concerned about.  If I go out for long periods to a store, I would take the rollator.       Pt accompanied by: self  PERTINENT HISTORY: Asthma, CAD, CKD, DMII, HLD, HTN, MI 2012, back surgery 2011  PAIN:  Are you having pain? Yes: NPRS scale: 4/10 Pain location: top of head Pain description: sharp, electric Aggravating factors: in the AM/varies Relieving factors: tylenol   PRECAUTIONS: Fall  RED FLAGS: None   WEIGHT BEARING RESTRICTIONS: No  FALLS: Has patient fallen in last 6 months? No  LIVING ENVIRONMENT: Lives with: lives with their spouse Lives in: House/apartment Stairs: 4 steps to enter with handraill 1 story home Has following equipment at home: Environmental consultant - 4 wheeled, Tour manager, and Grab bars  PLOF: Independent, Vocation/Vocational requirements: retired, and Leisure: yardwork  PATIENT GOALS: improve imbalance   OBJECTIVE:    TODAY'S TREATMENT: 10/21/2023 Activity Comments  Sit to stand 2 x 5 reps From mat height, then 18 chair, prefers UE support at chair, able to do without, but with more effort  Narrow BOS: EO/EC head turns Wide BOS EC head turns/nods Mild sway  Heel toe raises x 10   Alt step taps 2 x 10 BUE>1 UE support  Forward/back walking along counter Unsteady in posterior direction-cues for equal/even step length  Forward tandem gait x 4 reps  More unsteady with looking ahead at target/not at feet  NuStep, Level 4, 4 extremities x 8 minutes Strengthening, flexibility   HOME EXERCISE PROGRAM: Access Code: [email or web address] URL: [email or web address] Date: 10/21/2023 Prepared by: [facility] Outpatient  Re[facility] - [facility]  Exercises - Sit to Stand  - 1 x daily - 7 x weekly - 3 sets - 5 reps - Narrow Stance with Counter Support  - 1 x daily - 7 x weekly  - 1 sets - 3 reps - 30 sec  hold - Backward Walking with Counter Support  - 1 x daily - 7 x weekly - 1 sets - 3 reps - Tandem Walking with Counter Support  - 1 x daily - 7 x weekly - 1 sets - 3 reps  PATIENT EDUCATION: Education details: HEP initiated Person educated: Patient Education method: Programmer, multimedia, Demonstration, Verbal cues, and Handouts Education comprehension: verbalized understanding, returned demonstration, and needs further education  ------------------------------------------- Note: Objective measures were completed at Evaluation unless otherwise noted.  DIAGNOSTIC FINDINGS:  09/19/23 head CT: Recent resection of Right temporal lobe mass, stable from postoperative MRI.  COGNITION: Overall cognitive status: Within functional limits for tasks assessed   SENSATION: Pt denies N/T in UEs/LEs  COORDINATION: Alternating pronation/supination: B mild dysdiadochokinesia Alternating toe tap: B WNL Finger to nose: B WNL   MUSCLE TONE: B WNL  POSTURE: rounded shoulders and forward head  LOWER EXTREMITY ROM:     Active  Right Eval Left Eval  Hip flexion    Hip extension    Hip abduction    Hip adduction    Hip internal rotation    Hip external rotation    Knee flexion    Knee extension    Ankle dorsiflexion -4 -4  Ankle plantarflexion    Ankle inversion    Ankle eversion     (Blank rows = not tested)  LOWER EXTREMITY MMT:    MMT (in sitting) Right Eval Left Eval  Hip flexion 4 4+  Hip extension    Hip abduction 4+ 4  Hip adduction 4+ 4+  Hip internal rotation    Hip external rotation    Knee flexion 4+ 4  Knee extension 4+ 4+  Ankle dorsiflexion 4+ 4+  Ankle plantarflexion 4+ 4+  Ankle inversion    Ankle eversion    (Blank rows = not tested)  GAIT: Findings: Assistive device utilized:None, Level of assistance: Modified independence, and Comments: slowed and cautious gait with decreased B step length  FUNCTIONAL TESTS:      M-CTSIB   Condition 1: Firm Surface, EO 30 Sec, Normal Sway  Condition 2: Firm Surface, EC 30 Sec, Mild Sway  Condition 3: Foam Surface, EO 30 Sec, Mild Sway  Condition 4: Foam Surface, EC 15 Sec, Severe and R LOB Sway                                                                                                                                 TREATMENT DATE: 10/06/23    PATIENT EDUCATION: Education details: prognosis, POC, exam findings, pt's scheduling concerns  Person educated: Patient Education method: Explanation Education comprehension: verbalized understanding  HOME EXERCISE PROGRAM: Not initiated yet     GOALS: Goals reviewed with patient? Yes  SHORT TERM GOALS: Target date: 11/03/2023  Patient to be independent with initial HEP. Baseline: HEP initiated Goal status: INITIAL    LONG TERM GOALS: Target date: 12/01/2023  Patient to be independent with advanced  HEP. Baseline: Not yet initiated  Goal status: INITIAL  Patient to score at least 23/30 on FGA in order to decrease risk of falls.  Baseline: 14 Goal status: INITIAL  Patient to demonstrate gait speed of at least 2.8 ft/sec in order to improve access to community.    Baseline: 2.5 ft/sec Goal status: INITIAL  Patient to demonstrate 5xSTS test in <15 sec in order to decrease risk of falls.  Baseline: 24.65 sec Goal status: INITIAL  Patient to demonstrate balance for 30 sec on MCTSIB condition #4 in order to improve stability in dim lighting.  Baseline: 15 sec Goal status: INITIAL  Patient to return to safely ambulating on outdoor surfaces with LRAD.   Baseline: reports currently using 4WW outside and cannot perform yardwork Goal status: INITIAL    ASSESSMENT:  CLINICAL IMPRESSION: Pt presents today and reports he will start radiation/chemo next week. Skilled PT session focused on initiating HEP for functional strength and balance. Pt needs UE support for stability with balance exercises.  Pt will  continue to benefit from skilled PT towards goals for improved functional mobility and decreased fall risk.    OBJECTIVE IMPAIRMENTS: Abnormal gait, decreased activity tolerance, decreased balance, decreased coordination, decreased endurance, decreased ROM, decreased strength, impaired flexibility, postural dysfunction, and pain.   ACTIVITY LIMITATIONS: carrying, lifting, bending, standing, squatting, stairs, transfers, bathing, dressing, reach over head, hygiene/grooming, and locomotion level  PARTICIPATION LIMITATIONS: meal prep, cleaning, laundry, driving, shopping, community activity, yard work, and church  PERSONAL FACTORS: Age, Fitness, Past/current experiences, Time since onset of injury/illness/exacerbation, and 3+ comorbidities: Asthma, CAD, CKD, DMII, HLD, HTN, MI 2012, back surgery 2011 are also affecting patient's functional outcome.   REHAB POTENTIAL: Good  CLINICAL DECISION MAKING: Evolving/moderate complexity  EVALUATION COMPLEXITY: Moderate  PLAN:  PT FREQUENCY: 2x/week  PT DURATION: 8 weeks  PLANNED INTERVENTIONS: 97164- PT Re-evaluation, 97750- Physical Performance Testing, 97110-Therapeutic exercises, 97530- Therapeutic activity, 97112- Neuromuscular re-education, 97535- Self Care, [id number]- Manual therapy, [phone]- Gait training, [phone]- Orthotic Initial, [phone]- Canalith repositioning, [phone] (1-2 muscles), [id number] (3+ muscles)- Dry Needling, Patient/Family education, Balance training, Stair training, Taping, Vestibular training, Cryotherapy, and Moist heat  PLAN FOR NEXT SESSION: Review and progress HEP for multisensory balance, narrow BOS, EC, stepping strategy and speed of movements     Greig Anon, PT 10/21/23 9:28 AM Phone: [phone] Fax: [phone]  [facility] at [facility] [address] [place], [place] [address] Phone # [phone] Fax # [phone]

## 2023-10-21 NOTE — Therapy (Signed)
 OUTPATIENT OCCUPATIONAL THERAPY NEURO Treatment Note  Patient Name: Joel Williams MRN: [id number] DOB:1950-11-28, 73 y.o., male Today's Date: 10/21/2023  PCP: [name], MD REFERRING PROVIDER: Pegge Toribio PARAS, PA-C  END OF SESSION:  OT End of Session - 10/21/23 0813     Visit Number 2    Number of Visits 13    Date for OT Re-Evaluation 11/26/23    Authorization Type Medicare A&B / [employer]    OT Start Time 0802    OT Stop Time 0845    OT Time Calculation (min) 43 min           Past Medical History:  Diagnosis Date   Allergic rhinitis    Arthritis    ASTHMA 06/08/2008   Asthma    as a child   CAD (coronary artery disease)    a. s/p Promus DES (3.0 x 16 mm) to mid LAD 05/16/10;  b. Myoview  05/15/10: anteroseptal ischemia with EF 52%;   c. Cath 05/16/10: single vessel CAD with mLAD 90% tx with PCI and EF 50%;  d. 07/2011 Cath:  Patent stent, nonobs dzs, NL EF.   Chronic kidney disease    was seen by Dr. Lonna and released.   Colonic polyp    DIABETES MELLITUS, TYPE II 06/08/2008   GERD (gastroesophageal reflux disease)    Hemorrhoids    HYPERLIPIDEMIA 06/08/2008   HYPERTENSION 06/08/2008   Impotence of organic origin 08/24/2008   Myocardial infarction (HCC) 04/16/2010   Pneumonia    hx of   Renal vein thrombosis (HCC)    Seasonal allergies    Past Surgical History:  Procedure Laterality Date   APPLICATION OF CRANIAL NAVIGATION Right 09/14/2023   Procedure: COMPUTER-ASSISTED NAVIGATION, FOR CRANIAL PROCEDURE;  Surgeon: Lanis Pupa, MD;  Location: [facility] OR;  Service: Neurosurgery;  Laterality: Right;   BACK SURGERY  2011   CATARACT EXTRACTION Bilateral    2024   CORONARY ANGIOPLASTY WITH STENT PLACEMENT     CRANIOTOMY Right 09/14/2023   Procedure: STEREOTACTIC RIGHT TEMPRO-OCCIPITAL CRANIOTOMY FOR RESECTION OF TUMOR;  Surgeon: Lanis Pupa, MD;  Location: [facility] OR;  Service: Neurosurgery;  Laterality: Right;   LEFT HEART  CATHETERIZATION WITH CORONARY ANGIOGRAM N/A 07/29/2011   Procedure: LEFT HEART CATHETERIZATION WITH CORONARY ANGIOGRAM;  Surgeon: Lonni JONETTA Cash, MD;  Location: [facility] CATH LAB;  Service: Cardiovascular;  Laterality: N/A;   LEFT HEART CATHETERIZATION WITH CORONARY ANGIOGRAM N/A 06/14/2013   Procedure: LEFT HEART CATHETERIZATION WITH CORONARY ANGIOGRAM;  Surgeon: Lonni JONETTA Cash, MD;  Location: [facility] CATH LAB;  Service: Cardiovascular;  Laterality: N/A;   RADIOLOGY WITH ANESTHESIA N/A 06/09/2022   Procedure: MRI WITH ANESTHESIA OF LUMBAR SPINE WITHOUT CONTRAST;  Surgeon: Radiologist, Medication, MD;  Location: [facility] OR;  Service: Radiology;  Laterality: N/A;   Patient Active Problem List   Diagnosis Date Noted   Cancer of temporal lobe (HCC) 10/12/2023   Glioblastoma, IDH-wildtype (HCC) 09/17/2023   Status post craniotomy 09/14/2023   Brain tumor (HCC) 09/14/2023   Headache 08/23/2023   Lower urinary tract symptoms (LUTS) 08/19/2023   Obesity with body mass index (BMI) of 30.0 to 39.9 07/08/2017   Chronic diastolic CHF (congestive heart failure) (HCC) 07/07/2017   CAD (coronary artery disease)    Unintentional weight loss 08/23/2008   History of colonic polyps 08/23/2008   Diabetes mellitus without complication (HCC) 06/08/2008   Hyperlipidemia 06/08/2008   Essential hypertension 06/08/2008   Asthma 06/08/2008    ONSET DATE: referral date 09/22/23  REFERRING  DIAG: D49.6 (ICD-10-CM) - Brain tumor (HCC)  THERAPY DIAG:  Other symptoms and signs involving cognitive functions following other cerebrovascular disease  Visuospatial deficit  Muscle weakness (generalized)  Rationale for Evaluation and Treatment: Rehabilitation  SUBJECTIVE:   SUBJECTIVE STATEMENT: Pt reports that he is to start his chemo and radiation next week.   Pt accompanied by: self and family member (daughter)  PERTINENT HISTORY: 73 y.o. male who presented 09/14/23 for stereotactic R temporo-occipital  craniotomy for resection of large R temporal tumor. Postop MRI reviewed and demonstrates near complete resection of tumor.PMH: arthritis, asthma, CAD, CKD, DM2, GERD, HLD, HTN, MI 2012, back surgery 2011, renal vein thrombosis   PRECAUTIONS: Fall and Other: no lifting >5-10#  WEIGHT BEARING RESTRICTIONS: No  PAIN:  Are you having pain? Yes: NPRS scale: 5/10 Pain location: head at surgery site Pain description: numbness, dull - sunburn sensation Aggravating factors: unsure Relieving factors: pain meds  FALLS: Has patient fallen in last 6 months? 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  HAND DOMINANCE: Right  ADLs: Overall ADLs: Mod I, uses shower chair in bathroom for energy conservation when shaving and getting dressed Transfers/ambulation related to ADLs: RW occasionally in home, Rollator in the community for balance and endurance  IADLs: Light housekeeping: wife completes primarily Meal Prep: wife completes primarily, pt would grill before Community mobility: not cleared to drive, would like to return when safe Medication management: wife and daughters administer meds Financial management: daughter is currently managing finances Handwriting: reports poor penmanship prior  MOBILITY STATUS: Needs Assist: uses Rollator outside or in the community for balance and endurance  POSTURE COMMENTS:  rounded shoulders and  forward head  ACTIVITY TOLERANCE: Activity tolerance: fatigues quickly; will use Rollator outside or in the community for balance and endurance  FUNCTIONAL OUTCOME MEASURES: PSFS: 1.8    UPPER EXTREMITY ROM:    Active ROM Right eval Left eval  Shoulder flexion 150 (shoulder pain with overhead reach) [facility]  Shoulder abduction [facility]  Shoulder adduction    Shoulder extension    Shoulder internal rotation 80% WFL  Shoulder external rotation [facility] WFL  Elbow flexion    Elbow extension    Wrist flexion    Wrist extension    Wrist ulnar deviation    Wrist radial deviation    Wrist pronation    Wrist supination    (Blank rows = not tested)  UPPER EXTREMITY MMT:   grossly 4/5 overall  HAND FUNCTION: Grip strength: Right: 55, 55, 48 (average 52.67) lbs; Left: 55, 57, 53 (average 55) lbs  COORDINATION: 9 Hole Peg test: Right: 35.22 sec; Left: 35.50 sec Alternating pronation/supination: B mild dysdiadochokinesia   SENSATION: WFL, reports mild decreased sensation in finger tips but not different  COGNITION: Overall cognitive status: Within functional limits for tasks assessed; reports decreased sequencing and organization with finances and reading for Bible study  VISION: Subjective report: wears glasses Baseline vision: Wears glasses  all the time Visual history: cataracts, removal ~1 year ago  VISION ASSESSMENT: Ocular ROM: WFL Gaze preference/alignment: chin down Tracking/Visual pursuits: Able to track stimulus in all quads without difficulty Saccades: WFL Visual Fields: Left visual field deficits During confrontation testing, pt does not recognize visual stimuli in L upper quadrant  Patient has difficulty with following activities due to following visual impairments: reading small print  PERCEPTION: Impaired: mild                                                                                                                            TREATMENT DATE:  10/21/23 Pill  box assessment: Pt reading pill bottles and processing aloud but not asking any questions of therapist.  Pt reports that he will typically write on the bottle AM/PM to increase carryover and recall.  OT reiterating education on organization and checks and balances to ensure recall of systematic completion of medications, recommending continued assistance from spouse and daughters due to nature of and importance of chemo medications.   Pt completed pill box test with 100% accuracy, in 5:16 minutes, dropping 1 pill.  Pt with no errors and demonstrating good multi-tasking and probelm solving throughout task.  Pt demonstrating good organization of pills to increase ease with filling pill box in more systematic order.  Functional cognition further assessed with The Pillbox Test: A Measure of Executive Functioning and Estimate of Medication Management. A straight pass/fail designation is determined by 3 or more errors of omission or misplacement on the task. The pt completed the test with 0 errors. Peg board pattern: engaged in small peg board pattern replication with use of key to identify correlating colors, challenging alternating attention, organization, sequencing, and L visual attention/visual perception.  OT placed pattern to L to facilitate visual scanning to L.  Pt completing in sporadic order, initially with specific colors but then deviating from that pattern, therefore demonstrating decreased sequencing and organization.  Pt stating it was going well until it wasn't.  OT reviewed errors with pt, encouraging pt to complete in linear pattern from left to right to increase sequencing and recognition of errors.  Educated on functional carryover of task implications with systematic process to minimize onset of errors.  Energy conservation: educated on 4P's of energy conservation (planning, prioritizing, pacing, and positioning) and providing cues and examples for each.  Handout given.   10/11/23 Educated  on role and purpose of OT as well as potential interventions and goals for therapy based on initial evaluation findings. Educated on functional impairments of L visual field impairment, in L upper quadrant.    PATIENT EDUCATION: Education details: Energy conservation, sequencing and use of checks and balances Person educated: Patient Education method: Explanation, Verbal cues, and Handouts Education comprehension: verbalized understanding and needs further education  HOME EXERCISE PROGRAM: TBD   GOALS: Goals reviewed with patient? Yes  SHORT TERM GOALS: Target date: 11/05/23  Pt will be independent in vision HEP. Baseline: new to OPOT  Goal status: in progress  2.  Pt will independently recall at least 3 energy conservation strategies.  Baseline: decreased endurance Goal status: in progress  3.  Pt will independently recall or demo at least 3 vision compensation strategies.  Baseline: L visual field impairment Goal status: in progress   LONG TERM GOALS: Target date: 11/26/23  Pt will understand return to driving recommendations and/or rationale to continue to abstain from driving. Baseline: desire to return to driving Goal status: in progress  2.  Pt will demonstrate activity tolerance for 10-15 mins with 1 or fewer rest breaks, incorporating bending, to allow for safe return to aspects of yard work. Baseline: desire to return to yard work Goal status: in progress  3.  Pt will be able to attend to reading for 5 mins and answer questions with 80% accuracy. Baseline: difficulty with reading small print, impaired focus/attention Goal status: in progress  4.  Patient will demonstrate at least 90% accuracy with environmental visual scanning during mobility to assist with ADLs and IADLs  Baseline:  Goal status: in progress  5.  Pt will demonstrate improved awareness and sequencing to complete pill box assessment in <5 mins with 1 or fewer errors to demonstrate improved  ability to manage medications.  Baseline: 5:16, no errors Goal status: in progress  6.  Patient will report at least two-point increase in average PSFS score or at least three-point increase in a single activity score indicating functionally significant improvement given minimum detectable change. Baseline: 1.8 Goal status: in progress  ASSESSMENT:  CLINICAL IMPRESSION: Patient is a 73 y.o. male who was seen today for occupational therapy treatment with focus on sequencing, organization and energy conservation. Pt demonstrating good attention to L environment and pattern on L during peg board pattern replication, however due to decreased sequencing and organization pt still requiring moderate cues and assistance to complete and correct errors. Pt receptive to energy conservation education in preparation for initiating chemo and radiation treatment next week.  PERFORMANCE DEFICITS: in functional skills including IADLs, pain, balance, body mechanics, endurance, decreased knowledge of precautions, decreased knowledge of use of DME, and vision, cognitive skills including attention, energy/drive, and sequencing, and psychosocial skills including routines and behaviors.    PLAN:  OT FREQUENCY: 1-2x/week  OT DURATION: 6 weeks  PLANNED INTERVENTIONS: 97168 OT Re-evaluation, 97535 self care/ADL training, 02889 therapeutic exercise, 97530 therapeutic activity, 97112 neuromuscular re-education, functional mobility training, visual/perceptual remediation/compensation, psychosocial skills training, energy conservation, coping strategies training, patient/family education, and DME and/or AE instructions  RECOMMENDED OTHER SERVICES: NA  CONSULTED AND AGREED WITH PLAN OF CARE: Patient and family member/caregiver  PLAN FOR NEXT SESSION: Engage in dual tasking, Table top tasks with sequencing, memory, etc - spreadsheets, calendars, organization   [place], [place], OTR/L 10/21/2023, 11:10 AM   [facility]  Outpatient Rehab at [facility] [address], [address] [place], [place] [address] Phone # [phone] Fax # [phone]

## 2023-10-22 ENCOUNTER — Ambulatory Visit (INDEPENDENT_AMBULATORY_CARE_PROVIDER_SITE_OTHER): Admitting: Student in an Organized Health Care Education/Training Program

## 2023-10-22 ENCOUNTER — Encounter: Payer: Self-pay | Admitting: Student in an Organized Health Care Education/Training Program

## 2023-10-22 VITALS — BP 137/58 | HR 78 | Ht 69.0 in | Wt 220.4 lb

## 2023-10-22 DIAGNOSIS — K219 Gastro-esophageal reflux disease without esophagitis: Secondary | ICD-10-CM | POA: Insufficient documentation

## 2023-10-22 DIAGNOSIS — E119 Type 2 diabetes mellitus without complications: Secondary | ICD-10-CM

## 2023-10-22 DIAGNOSIS — I2510 Atherosclerotic heart disease of native coronary artery without angina pectoris: Secondary | ICD-10-CM | POA: Diagnosis not present

## 2023-10-22 DIAGNOSIS — I10 Essential (primary) hypertension: Secondary | ICD-10-CM

## 2023-10-22 DIAGNOSIS — C719 Malignant neoplasm of brain, unspecified: Secondary | ICD-10-CM

## 2023-10-22 MED ORDER — PANTOPRAZOLE SODIUM 40 MG PO TBEC: 40.0000 mg | DELAYED_RELEASE_TABLET | Freq: Every day | ORAL | 3 refills | Status: DC

## 2023-10-22 MED ORDER — ASPIRIN 81 MG PO TBEC: 81.0000 mg | DELAYED_RELEASE_TABLET | Freq: Every day | ORAL | Status: AC

## 2023-10-22 NOTE — Assessment & Plan Note (Signed)
 GERD managed with pantoprazole ; stress and surgery may exacerbate. Refill pantoprazole  and continue use.

## 2023-10-22 NOTE — Progress Notes (Signed)
 Established Patient Office Visit  Subjective   Patient ID: Joel Williams, male    DOB: 13-Oct-1950  Age: 73 y.o. MRN: [id number]  Chief Complaint  Patient presents with   Follow-up    Review medications and patients family has some concerns    HPI  Discussed the use of AI scribe software for clinical note transcription with the patient, who gave verbal consent to proceed.  History of Present Illness Joel Williams is a 73 year old male with glioblastoma who presents for follow-up after brain surgery and to discuss upcoming chemotherapy and radiation therapy.  He underwent brain surgery on July 1st to remove a stage four glioblastoma. Post-surgery, he has been meeting with an oncologist and a radiation oncologist who have recommended a treatment plan involving radiation therapy and oral chemotherapy. Radiation therapy is scheduled to start on Tuesday, and oral chemotherapy with Temodar  will begin on Monday night.  He has been undergoing occupational and physical therapy post-surgery. He feels quite busy with these activities and notes exhaustion by 7 PM after a day of therapy, despite not finding the day particularly stressful. 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  He reports a vision problem, particularly with concentrating on small print, which was not an issue before. He had cataract surgery a year ago, which improved his vision significantly, but now finds it challenging to read smaller fonts. He speculates whether this is related to eye muscle issues.  He describes headaches around his incision site, sometimes feeling like a sunburn, and has started wearing a hat for protection. He notes a loss of feeling on the side of his skull where the incision is located.  He has stopped taking Topamax  and Keppra  as advised. He also takes three blood pressure  medications, which are managing his blood pressure well. He has a history of a stent placed in 2012 and was on aspirin , which was stopped before surgery.     Objective:     BP (!) 137/58 (BP Location: Right Arm, Patient Position: Sitting, Cuff Size: Large)   Pulse 78   Ht 5' 9 (1.753 m)   Wt 220 lb 6.4 oz (100 kg)   SpO2 97%   BMI 32.55 kg/m   Physical Exam  Gen: Well-appearing man Heart: Regular, no murmur Lungs: Unlabored, clear throughout Ext: Moderate sarcopenia, no edema Neuro: Alert, conversational, moderately slow to get up and go, able to get onto the table by himself, full strength upper and lower extremities, normal eye movements.  Normal speech.    Assessment & Plan:    Problem List Items Addressed This Visit       High   Diabetes mellitus without complication (HCC) (Chronic)   Managed by endocrinologist; coordinate during chemotherapy. Continue current management plan with endocrinologist.      Relevant Medications   aspirin  EC 81 MG tablet   CAD (coronary artery disease) (Chronic)   Coronary artery disease with stent; aspirin  can be resumed post-craniotomy. Resume low-dose aspirin  (81 mg) daily.      Relevant Medications   aspirin  EC 81 MG tablet   Glioblastoma, IDH-wildtype (HCC) - Primary (Chronic)   Stage IV glioblastoma post-craniotomy, planning to start chemoradiation. Discussed Temodar  side effects and infection risks due to low blood counts. Continue radiation therapy and Temodar . Monitor blood counts. Use Zofran  for nausea as needed. Avoid infection exposure when counts are  low. Contact office for fevers or low blood pressure. Monitor blood pressure at home; hold medications if systolic <100.      Relevant Medications   aspirin  EC 81 MG tablet     Medium    Essential hypertension (Chronic)   Blood pressure well-controlled; monitor due to chemotherapy. Continue current medications. Monitor blood pressure at home; contact office if systolic  <100.      Relevant Medications   aspirin  EC 81 MG tablet     Low   GERD (gastroesophageal reflux disease)   GERD managed with pantoprazole ; stress and surgery may exacerbate. Refill pantoprazole  and continue use.      Relevant Medications   pantoprazole  (PROTONIX ) 40 MG tablet    Return in about 4 weeks (around 11/19/2023).    Cleatus Debby Specking, MD

## 2023-10-22 NOTE — Assessment & Plan Note (Signed)
 Coronary artery disease with stent; aspirin  can be resumed post-craniotomy. Resume low-dose aspirin  (81 mg) daily.

## 2023-10-22 NOTE — Assessment & Plan Note (Signed)
 Stage IV glioblastoma post-craniotomy, planning to start chemoradiation. Discussed Temodar  side effects and infection risks due to low blood counts. Continue radiation therapy and Temodar . Monitor blood counts. Use Zofran  for nausea as needed. Avoid infection exposure when counts are low. Contact office for fevers or low blood pressure. Monitor blood pressure at home; hold medications if systolic <100.

## 2023-10-22 NOTE — Assessment & Plan Note (Signed)
 Blood pressure well-controlled; monitor due to chemotherapy. Continue current medications. Monitor blood pressure at home; contact office if systolic <100.

## 2023-10-22 NOTE — Assessment & Plan Note (Signed)
 Managed by endocrinologist; coordinate during chemotherapy. Continue current management plan with endocrinologist.

## 2023-10-22 NOTE — Patient Instructions (Signed)
  VISIT SUMMARY: Today, we discussed your follow-up care after your brain surgery for glioblastoma. We reviewed your upcoming chemotherapy and radiation therapy, addressed your current symptoms, and adjusted your treatment plan accordingly.  YOUR PLAN: -GLIOBLASTOMA, RIGHT TEMPORAL LOBE, POST-CRANIOTOMY, ON CHEMORADIATION: Glioblastoma is an aggressive type of brain cancer. You will continue with radiation therapy starting Tuesday and begin oral chemotherapy with Temodar  on Monday night. We discussed the side effects of Temodar  and the importance of monitoring your blood counts weekly. Use Zofran  for nausea as needed and avoid exposure to infections when your blood counts are low. Contact our office if you experience fevers or low blood pressure. Monitor your blood pressure at home and hold your medications if your systolic pressure is below 100.  -HEADACHE AFTER CRANIOTOMY: Headaches after your brain surgery are likely due to nerve healing and the recovery of your incision. Continue wearing a hat to protect the incision site and monitor your headache symptoms. Please report any changes in your symptoms.  -COGNITIVE AND VISUAL IMPAIRMENT AFTER RIGHT TEMPORAL LOBE SURGERY: Your cognitive and visual impairments are due to the location of your tumor, which affects memory and visual-spatial processing. Continue with occupational, physical, and speech therapy once you have adjusted to your treatment. Use large print materials to help with reading.  -CORONARY ARTERY DISEASE WITH DRUG-ELUTING STENT, STATUS POST ANTIPLATELET THERAPY HOLD: You have coronary artery disease and a stent, and you can now resume taking low-dose aspirin  (81 mg) daily after your brain surgery.  -HYPERTENSION: Your blood pressure is well-controlled with your current medications. Continue monitoring your blood pressure at home, especially during chemotherapy. Contact our office if your systolic pressure drops below 899.  -TYPE 2  DIABETES MELLITUS: Your diabetes is managed by your endocrinologist. Continue following your current management plan and coordinate with your endocrinologist during your chemotherapy.  -GASTROESOPHAGEAL REFLUX DISEASE: Your GERD is managed with pantoprazole . Stress and surgery may worsen your symptoms, so continue taking pantoprazole  as prescribed.  INSTRUCTIONS: Please monitor your blood pressure at home and contact our office if your systolic pressure drops below 899. Continue with your occupational, physical, and speech therapy as you adjust to your treatment. Use large print materials for reading. Resume taking low-dose aspirin  (81 mg) daily. Monitor your blood counts weekly and avoid exposure to infections when your counts are low. Contact our office if you experience fevers or low blood pressure.

## 2023-10-26 ENCOUNTER — Other Ambulatory Visit: Payer: Self-pay

## 2023-10-26 DIAGNOSIS — C712 Malignant neoplasm of temporal lobe: Secondary | ICD-10-CM | POA: Diagnosis not present

## 2023-10-26 DIAGNOSIS — Z510 Encounter for antineoplastic radiation therapy: Secondary | ICD-10-CM | POA: Diagnosis not present

## 2023-10-26 LAB — RAD ONC ARIA SESSION SUMMARY
Course Elapsed Days: 0
Plan Fractions Treated to Date: 1
Plan Prescribed Dose Per Fraction: 2 Gy
Plan Total Fractions Prescribed: 23
Plan Total Prescribed Dose: 46 Gy
Reference Point Dosage Given to Date: 2 Gy
Reference Point Session Dosage Given: 2 Gy
Session Number: 1

## 2023-10-27 ENCOUNTER — Other Ambulatory Visit: Payer: Self-pay

## 2023-10-27 ENCOUNTER — Ambulatory Visit
Admission: RE | Admit: 2023-10-27 | Discharge: 2023-10-27 | Disposition: A | Discharge status: Home / Self Care | Source: Ambulatory Visit | Attending: Radiation Oncology | Admitting: Radiation Oncology

## 2023-10-27 DIAGNOSIS — Z510 Encounter for antineoplastic radiation therapy: Secondary | ICD-10-CM | POA: Diagnosis not present

## 2023-10-27 DIAGNOSIS — C712 Malignant neoplasm of temporal lobe: Secondary | ICD-10-CM | POA: Diagnosis not present

## 2023-10-27 LAB — RAD ONC ARIA SESSION SUMMARY
Course Elapsed Days: 1
Plan Fractions Treated to Date: 2
Plan Prescribed Dose Per Fraction: 2 Gy
Plan Total Fractions Prescribed: 23
Plan Total Prescribed Dose: 46 Gy
Reference Point Dosage Given to Date: 4 Gy
Reference Point Session Dosage Given: 2 Gy
Session Number: 2

## 2023-10-28 ENCOUNTER — Other Ambulatory Visit: Payer: Self-pay

## 2023-10-28 ENCOUNTER — Inpatient Hospital Stay: Admitting: Internal Medicine

## 2023-10-28 ENCOUNTER — Inpatient Hospital Stay

## 2023-10-28 ENCOUNTER — Ambulatory Visit
Admission: RE | Admit: 2023-10-28 | Discharge: 2023-10-28 | Disposition: A | Discharge status: Home / Self Care | Source: Ambulatory Visit | Attending: Radiation Oncology | Admitting: Radiation Oncology

## 2023-10-28 DIAGNOSIS — Z510 Encounter for antineoplastic radiation therapy: Secondary | ICD-10-CM | POA: Diagnosis not present

## 2023-10-28 DIAGNOSIS — C712 Malignant neoplasm of temporal lobe: Secondary | ICD-10-CM | POA: Diagnosis not present

## 2023-10-28 LAB — RAD ONC ARIA SESSION SUMMARY
Course Elapsed Days: 2
Plan Fractions Treated to Date: 3
Plan Prescribed Dose Per Fraction: 2 Gy
Plan Total Fractions Prescribed: 23
Plan Total Prescribed Dose: 46 Gy
Reference Point Dosage Given to Date: 6 Gy
Reference Point Session Dosage Given: 2 Gy
Session Number: 3

## 2023-10-29 ENCOUNTER — Other Ambulatory Visit: Payer: Self-pay

## 2023-10-29 ENCOUNTER — Ambulatory Visit
Admission: RE | Admit: 2023-10-29 | Discharge: 2023-10-29 | Disposition: A | Discharge status: Home / Self Care | Source: Ambulatory Visit | Attending: Radiation Oncology | Admitting: Radiation Oncology

## 2023-10-29 DIAGNOSIS — Z510 Encounter for antineoplastic radiation therapy: Secondary | ICD-10-CM | POA: Diagnosis not present

## 2023-10-29 DIAGNOSIS — C712 Malignant neoplasm of temporal lobe: Secondary | ICD-10-CM | POA: Diagnosis not present

## 2023-10-29 LAB — RAD ONC ARIA SESSION SUMMARY
Course Elapsed Days: 3
Plan Fractions Treated to Date: 4
Plan Prescribed Dose Per Fraction: 2 Gy
Plan Total Fractions Prescribed: 23
Plan Total Prescribed Dose: 46 Gy
Reference Point Dosage Given to Date: 8 Gy
Reference Point Session Dosage Given: 2 Gy
Session Number: 4

## 2023-11-01 ENCOUNTER — Other Ambulatory Visit: Payer: Self-pay

## 2023-11-01 ENCOUNTER — Ambulatory Visit
Admission: RE | Admit: 2023-11-01 | Discharge: 2023-11-01 | Disposition: A | Discharge status: Home / Self Care | Source: Ambulatory Visit | Attending: Radiation Oncology | Admitting: Radiation Oncology

## 2023-11-01 DIAGNOSIS — Z510 Encounter for antineoplastic radiation therapy: Secondary | ICD-10-CM | POA: Diagnosis not present

## 2023-11-01 DIAGNOSIS — C719 Malignant neoplasm of brain, unspecified: Secondary | ICD-10-CM

## 2023-11-01 DIAGNOSIS — C712 Malignant neoplasm of temporal lobe: Secondary | ICD-10-CM

## 2023-11-01 DIAGNOSIS — H8391 Unspecified disease of right inner ear: Secondary | ICD-10-CM

## 2023-11-01 LAB — RAD ONC ARIA SESSION SUMMARY
Course Elapsed Days: 6
Plan Fractions Treated to Date: 5
Plan Prescribed Dose Per Fraction: 2 Gy
Plan Total Fractions Prescribed: 23
Plan Total Prescribed Dose: 46 Gy
Reference Point Dosage Given to Date: 10 Gy
Reference Point Session Dosage Given: 2 Gy
Session Number: 5

## 2023-11-01 MED ORDER — SONAFINE EX EMUL: 1.0000 | Freq: Two times a day (BID) | CUTANEOUS | Status: DC

## 2023-11-02 ENCOUNTER — Ambulatory Visit
Admission: RE | Admit: 2023-11-02 | Discharge: 2023-11-02 | Disposition: A | Discharge status: Home / Self Care | Source: Ambulatory Visit | Attending: Radiation Oncology | Admitting: Radiation Oncology

## 2023-11-02 ENCOUNTER — Telehealth: Payer: Self-pay | Admitting: Radiation Oncology

## 2023-11-02 ENCOUNTER — Ambulatory Visit: Admitting: Physical Therapy

## 2023-11-02 ENCOUNTER — Ambulatory Visit: Admitting: Occupational Therapy

## 2023-11-02 ENCOUNTER — Other Ambulatory Visit (HOSPITAL_COMMUNITY): Payer: Self-pay

## 2023-11-02 ENCOUNTER — Other Ambulatory Visit: Payer: Self-pay

## 2023-11-02 DIAGNOSIS — C712 Malignant neoplasm of temporal lobe: Secondary | ICD-10-CM | POA: Diagnosis not present

## 2023-11-02 DIAGNOSIS — Z510 Encounter for antineoplastic radiation therapy: Secondary | ICD-10-CM | POA: Diagnosis not present

## 2023-11-02 LAB — RAD ONC ARIA SESSION SUMMARY
Course Elapsed Days: 7
Plan Fractions Treated to Date: 6
Plan Prescribed Dose Per Fraction: 2 Gy
Plan Total Fractions Prescribed: 23
Plan Total Prescribed Dose: 46 Gy
Reference Point Dosage Given to Date: 12 Gy
Reference Point Session Dosage Given: 2 Gy
Session Number: 6

## 2023-11-02 NOTE — Telephone Encounter (Signed)
 Contacted [facility] to make sure outgoing referral received, office scheduler verified referral was received. Pt will be contacted by [facility] to schedule next available.

## 2023-11-03 ENCOUNTER — Other Ambulatory Visit: Payer: Self-pay

## 2023-11-03 ENCOUNTER — Ambulatory Visit
Admission: RE | Admit: 2023-11-03 | Discharge: 2023-11-03 | Disposition: A | Discharge status: Home / Self Care | Source: Ambulatory Visit | Attending: Radiation Oncology | Admitting: Radiation Oncology

## 2023-11-03 DIAGNOSIS — C712 Malignant neoplasm of temporal lobe: Secondary | ICD-10-CM | POA: Diagnosis not present

## 2023-11-03 DIAGNOSIS — Z510 Encounter for antineoplastic radiation therapy: Secondary | ICD-10-CM | POA: Diagnosis not present

## 2023-11-03 LAB — RAD ONC ARIA SESSION SUMMARY
Course Elapsed Days: 8
Plan Fractions Treated to Date: 7
Plan Prescribed Dose Per Fraction: 2 Gy
Plan Total Fractions Prescribed: 23
Plan Total Prescribed Dose: 46 Gy
Reference Point Dosage Given to Date: 14 Gy
Reference Point Session Dosage Given: 2 Gy
Session Number: 7

## 2023-11-04 ENCOUNTER — Inpatient Hospital Stay

## 2023-11-04 ENCOUNTER — Ambulatory Visit
Admission: RE | Admit: 2023-11-04 | Discharge: 2023-11-04 | Disposition: A | Discharge status: Home / Self Care | Source: Ambulatory Visit | Attending: Radiation Oncology

## 2023-11-04 ENCOUNTER — Other Ambulatory Visit: Payer: Self-pay

## 2023-11-04 ENCOUNTER — Inpatient Hospital Stay: Admitting: Internal Medicine

## 2023-11-04 DIAGNOSIS — C712 Malignant neoplasm of temporal lobe: Secondary | ICD-10-CM | POA: Diagnosis not present

## 2023-11-04 DIAGNOSIS — Z510 Encounter for antineoplastic radiation therapy: Secondary | ICD-10-CM | POA: Diagnosis not present

## 2023-11-04 LAB — RAD ONC ARIA SESSION SUMMARY
Course Elapsed Days: 9
Plan Fractions Treated to Date: 8
Plan Prescribed Dose Per Fraction: 2 Gy
Plan Total Fractions Prescribed: 23
Plan Total Prescribed Dose: 46 Gy
Reference Point Dosage Given to Date: 16 Gy
Reference Point Session Dosage Given: 2 Gy
Session Number: 8

## 2023-11-05 ENCOUNTER — Other Ambulatory Visit: Payer: Self-pay

## 2023-11-05 ENCOUNTER — Ambulatory Visit
Admission: RE | Admit: 2023-11-05 | Discharge: 2023-11-05 | Disposition: A | Discharge status: Home / Self Care | Source: Ambulatory Visit | Attending: Radiation Oncology | Admitting: Radiation Oncology

## 2023-11-05 DIAGNOSIS — C712 Malignant neoplasm of temporal lobe: Secondary | ICD-10-CM | POA: Diagnosis not present

## 2023-11-05 DIAGNOSIS — Z510 Encounter for antineoplastic radiation therapy: Secondary | ICD-10-CM | POA: Diagnosis not present

## 2023-11-05 LAB — RAD ONC ARIA SESSION SUMMARY
Course Elapsed Days: 10
Plan Fractions Treated to Date: 9
Plan Prescribed Dose Per Fraction: 2 Gy
Plan Total Fractions Prescribed: 23
Plan Total Prescribed Dose: 46 Gy
Reference Point Dosage Given to Date: 18 Gy
Reference Point Session Dosage Given: 2 Gy
Session Number: 9

## 2023-11-08 ENCOUNTER — Ambulatory Visit
Admission: RE | Admit: 2023-11-08 | Discharge: 2023-11-08 | Disposition: A | Discharge status: Home / Self Care | Source: Ambulatory Visit | Attending: Radiation Oncology | Admitting: Radiation Oncology

## 2023-11-08 ENCOUNTER — Ambulatory Visit: Admitting: Physical Therapy

## 2023-11-08 ENCOUNTER — Other Ambulatory Visit: Payer: Self-pay

## 2023-11-08 ENCOUNTER — Ambulatory Visit (INDEPENDENT_AMBULATORY_CARE_PROVIDER_SITE_OTHER): Admitting: Otolaryngology

## 2023-11-08 ENCOUNTER — Encounter (INDEPENDENT_AMBULATORY_CARE_PROVIDER_SITE_OTHER): Payer: Self-pay | Admitting: Otolaryngology

## 2023-11-08 ENCOUNTER — Ambulatory Visit

## 2023-11-08 VITALS — BP 114/77 | HR 76

## 2023-11-08 DIAGNOSIS — H6123 Impacted cerumen, bilateral: Secondary | ICD-10-CM | POA: Diagnosis not present

## 2023-11-08 DIAGNOSIS — Z510 Encounter for antineoplastic radiation therapy: Secondary | ICD-10-CM | POA: Diagnosis not present

## 2023-11-08 DIAGNOSIS — H9201 Otalgia, right ear: Secondary | ICD-10-CM

## 2023-11-08 DIAGNOSIS — H903 Sensorineural hearing loss, bilateral: Secondary | ICD-10-CM

## 2023-11-08 DIAGNOSIS — C712 Malignant neoplasm of temporal lobe: Secondary | ICD-10-CM | POA: Diagnosis not present

## 2023-11-08 DIAGNOSIS — R42 Dizziness and giddiness: Secondary | ICD-10-CM

## 2023-11-08 LAB — RAD ONC ARIA SESSION SUMMARY
Course Elapsed Days: 13
Plan Fractions Treated to Date: 10
Plan Prescribed Dose Per Fraction: 2 Gy
Plan Total Fractions Prescribed: 23
Plan Total Prescribed Dose: 46 Gy
Reference Point Dosage Given to Date: 20 Gy
Reference Point Session Dosage Given: 2 Gy
Session Number: 10

## 2023-11-09 ENCOUNTER — Ambulatory Visit
Admission: RE | Admit: 2023-11-09 | Discharge: 2023-11-09 | Disposition: A | Discharge status: Home / Self Care | Source: Ambulatory Visit | Attending: Radiation Oncology | Admitting: Radiation Oncology

## 2023-11-09 ENCOUNTER — Inpatient Hospital Stay (HOSPITAL_BASED_OUTPATIENT_CLINIC_OR_DEPARTMENT_OTHER): Admitting: Internal Medicine

## 2023-11-09 ENCOUNTER — Inpatient Hospital Stay

## 2023-11-09 ENCOUNTER — Other Ambulatory Visit: Payer: Self-pay

## 2023-11-09 VITALS — BP 129/60 | HR 70 | Temp 97.7°F | Resp 16 | Ht 69.0 in | Wt 224.0 lb

## 2023-11-09 DIAGNOSIS — H6123 Impacted cerumen, bilateral: Secondary | ICD-10-CM | POA: Insufficient documentation

## 2023-11-09 DIAGNOSIS — C719 Malignant neoplasm of brain, unspecified: Secondary | ICD-10-CM

## 2023-11-09 DIAGNOSIS — H9201 Otalgia, right ear: Secondary | ICD-10-CM | POA: Insufficient documentation

## 2023-11-09 DIAGNOSIS — R42 Dizziness and giddiness: Secondary | ICD-10-CM | POA: Insufficient documentation

## 2023-11-09 DIAGNOSIS — H903 Sensorineural hearing loss, bilateral: Secondary | ICD-10-CM | POA: Insufficient documentation

## 2023-11-09 DIAGNOSIS — C712 Malignant neoplasm of temporal lobe: Secondary | ICD-10-CM

## 2023-11-09 DIAGNOSIS — Z510 Encounter for antineoplastic radiation therapy: Secondary | ICD-10-CM | POA: Diagnosis not present

## 2023-11-09 LAB — CMP (CANCER CENTER ONLY)
ALT: 21 U/L (ref 0–44)
AST: 17 U/L (ref 15–41)
Albumin: 4 g/dL (ref 3.5–5.0)
Alkaline Phosphatase: 64 U/L (ref 38–126)
Anion gap: 4 — ABNORMAL LOW (ref 5–15)
BUN: 13 mg/dL (ref 8–23)
CO2: 32 mmol/L (ref 22–32)
Calcium: 9.3 mg/dL (ref 8.9–10.3)
Chloride: 105 mmol/L (ref 98–111)
Creatinine: 0.84 mg/dL (ref 0.61–1.24)
GFR, Estimated: >60 mL/min (ref >60)
Glucose, Bld: 132 mg/dL — ABNORMAL HIGH (ref 70–99)
Potassium: 4.8 mmol/L (ref 3.5–5.1)
Sodium: 141 mmol/L (ref 135–145)
Total Bilirubin: 0.8 mg/dL (ref 0.0–1.2)
Total Protein: 6.5 g/dL (ref 6.5–8.1)

## 2023-11-09 LAB — RAD ONC ARIA SESSION SUMMARY
Course Elapsed Days: 14
Plan Fractions Treated to Date: 11
Plan Prescribed Dose Per Fraction: 2 Gy
Plan Total Fractions Prescribed: 23
Plan Total Prescribed Dose: 46 Gy
Reference Point Dosage Given to Date: 22 Gy
Reference Point Session Dosage Given: 2 Gy
Session Number: 11

## 2023-11-09 LAB — CBC WITH DIFFERENTIAL (CANCER CENTER ONLY)
Abs Immature Granulocytes: 0.02 K/uL (ref 0.00–0.07)
Basophils Absolute: 0.1 K/uL (ref 0.0–0.1)
Basophils Relative: 1 %
Eosinophils Absolute: 0.3 K/uL (ref 0.0–0.5)
Eosinophils Relative: 5 %
HCT: 39 % (ref 39.0–52.0)
Hemoglobin: 13.2 g/dL (ref 13.0–17.0)
Immature Granulocytes: 0 %
Lymphocytes Relative: 26 %
Lymphs Abs: 1.7 K/uL (ref 0.7–4.0)
MCH: 27.8 pg (ref 26.0–34.0)
MCHC: 33.8 g/dL (ref 30.0–36.0)
MCV: 82.1 fL (ref 80.0–100.0)
Monocytes Absolute: 0.8 K/uL (ref 0.1–1.0)
Monocytes Relative: 12 %
Neutro Abs: 3.6 K/uL (ref 1.7–7.7)
Neutrophils Relative %: 56 %
Platelet Count: 176 K/uL (ref 150–400)
RBC: 4.75 MIL/uL (ref 4.22–5.81)
RDW: 14.6 % (ref 11.5–15.5)
WBC Count: 6.5 K/uL (ref 4.0–10.5)
nRBC: 0 % (ref 0.0–0.2)

## 2023-11-09 NOTE — Progress Notes (Signed)
 CC: Dizziness, right ear pain, decreased right ear hearing, right temporal lobe glioblastoma  HPI:  Joel Williams is a 73 y.o. male who presents today for evaluation of his dizziness, right ear pain, and right ear hearing loss.  The patient has a history of stage IV right temporal lobe glioblastoma.  He was treated with craniotomy and resection of the tumor in July 2025.  He is currently receiving chemoradiation treatment.  According to the patient, he has noted intermittent dizziness, right ear pain, and right ear hearing loss since the surgery.  The patient has a history of left ear hearing loss.  His right ear was his better hearing ear.  He was previously fitted with bilateral hearing aids.  He describes his dizziness as an off-balance and lightheaded sensation.  He denies any spinning vertigo.  He denies any otorrhea.  He has no previous otologic surgery.  Past Medical History:  Diagnosis Date   Allergic rhinitis    Arthritis    ASTHMA 06/08/2008   Asthma    as a child   CAD (coronary artery disease)    a. s/p Promus DES (3.0 x 16 mm) to mid LAD 05/16/10;  b. Myoview  05/15/10: anteroseptal ischemia with EF 52%;   c. Cath 05/16/10: single vessel CAD with mLAD 90% tx with PCI and EF 50%;  d. 07/2011 Cath:  Patent stent, nonobs dzs, NL EF.   Chronic kidney disease    was seen by Dr. Lonna and released.   Colonic polyp    DIABETES MELLITUS, TYPE II 06/08/2008   GERD (gastroesophageal reflux disease)    Hemorrhoids    HYPERLIPIDEMIA 06/08/2008   HYPERTENSION 06/08/2008   Impotence of organic origin 08/24/2008   Myocardial infarction (HCC) 04/16/2010   Pneumonia    hx of   Renal vein thrombosis (HCC)    Seasonal allergies    Status post craniotomy 09/14/2023    Past Surgical History:  Procedure Laterality Date   APPLICATION OF CRANIAL NAVIGATION Right 09/14/2023   Procedure: COMPUTER-ASSISTED NAVIGATION, FOR CRANIAL PROCEDURE;  Surgeon: Lanis Pupa, MD;  Location: [facility] OR;   Service: Neurosurgery;  Laterality: Right;   BACK SURGERY  2011   CATARACT EXTRACTION Bilateral    2024   CORONARY ANGIOPLASTY WITH STENT PLACEMENT     CRANIOTOMY Right 09/14/2023   Procedure: STEREOTACTIC RIGHT TEMPRO-OCCIPITAL CRANIOTOMY FOR RESECTION OF TUMOR;  Surgeon: Lanis Pupa, MD;  Location: [facility] OR;  Service: Neurosurgery;  Laterality: Right;   LEFT HEART CATHETERIZATION WITH CORONARY ANGIOGRAM N/A 07/29/2011   Procedure: LEFT HEART CATHETERIZATION WITH CORONARY ANGIOGRAM;  Surgeon: Lonni JONETTA Cash, MD;  Location: [facility] CATH LAB;  Service: Cardiovascular;  Laterality: N/A;   LEFT HEART CATHETERIZATION WITH CORONARY ANGIOGRAM N/A 06/14/2013   Procedure: LEFT HEART CATHETERIZATION WITH CORONARY ANGIOGRAM;  Surgeon: Lonni JONETTA Cash, MD;  Location: [facility] CATH LAB;  Service: Cardiovascular;  Laterality: N/A;   RADIOLOGY WITH ANESTHESIA N/A 06/09/2022   Procedure: MRI WITH ANESTHESIA OF LUMBAR SPINE WITHOUT CONTRAST;  Surgeon: Radiologist, Medication, MD;  Location: [facility] OR;  Service: Radiology;  Laterality: N/A;    Family History  Problem Relation Age of Onset   Diabetes Father    Heart disease Father    Angina Father    Dementia Mother     Social History:  reports that he quit smoking about 21 years ago. His smoking use included cigarettes. He started smoking about 74 years ago. He has a 106 pack-year smoking history. He has never used smokeless tobacco. He  reports that he does not currently use alcohol. He reports that he does not use drugs.  Allergies:  Allergies  Allergen Reactions   Invokana [Canagliflozin]     Yeast infections   Trulicity [Dulaglutide] Other (See Comments)    yeast infections    Prior to Admission medications   Medication Sig Start Date End Date Taking? Authorizing Provider  albuterol  (VENTOLIN  HFA) 108 (90 Base) MCG/ACT inhaler Inhale 1-2 puffs into the lungs every 6 (six) hours as needed for wheezing or shortness of breath. 09/24/23  Yes  Angiulli, Toribio PARAS, PA-C  amLODipine  (NORVASC ) 10 MG tablet Take 1 tablet (10 mg total) by mouth at bedtime. 09/24/23  Yes Angiulli, Toribio PARAS, PA-C  aspirin  EC 81 MG tablet Take 1 tablet (81 mg total) by mouth daily. Swallow whole. 10/22/23  Yes [name], MD  Continuous Glucose Sensor (FREESTYLE LIBRE 3 PLUS SENSOR) MISC apply to upper back of arm for 90 days change sensor every 15 days IC-10 CODE: E11.9   Yes [provider]  insulin  aspart (NOVOLOG ) 100 UNIT/ML injection Inject 0-20 Units into the skin 4 (four) times daily -  before meals and at bedtime. CBG 70 - 120: 0 units CBG 121 - 150: 3 units CBG 151 - 200: 4 units CBG 201 - 250: 7 units CBG 251 - 300: 11 units CBG 301 - 350: 15 units CBG 351 - 400: 20 units CBG > 400: call MD and obtain STAT lab verification 09/17/23  Yes Kathrin Mignon DASEN, MD  insulin  aspart (NOVOLOG ) 100 UNIT/ML injection Inject 5 Units into the skin with breakfast, with lunch, and with evening meal. 09/17/23  Yes Gonfa, Mignon DASEN, MD  irbesartan  (AVAPRO ) 150 MG tablet Take 1 tablet (150 mg total) by mouth daily. 09/24/23  Yes Angiulli, Toribio PARAS, PA-C  metFORMIN  (GLUCOPHAGE -XR) 500 MG 24 hr tablet Take 2 tablets (1,000 mg total) by mouth 2 (two) times daily. 09/24/23  Yes Angiulli, Toribio PARAS, PA-C  metoprolol  succinate (TOPROL -XL) 50 MG 24 hr tablet Take 1 tablet (50 mg total) by mouth daily. Take with or immediately following a meal. 09/24/23  Yes Angiulli, Toribio PARAS, PA-C  pantoprazole  (PROTONIX ) 40 MG tablet Take 1 tablet (40 mg total) by mouth at bedtime. 10/22/23  Yes [name], MD  simvastatin  (ZOCOR ) 20 MG tablet Take 1 tablet (20 mg total) by mouth daily. 09/24/23  Yes Angiulli, Toribio PARAS, PA-C  temozolomide  (TEMODAR ) 140 MG capsule Take 1 capsule (140 mg total) by mouth daily. (Take with ONE 20 mg capsule for total daily dose of 160 mg). May take on an empty stomach to decrease nausea & vomiting. Patient not taking: Reported on 11/08/2023 10/14/23    Vaslow, Zachary K, MD  temozolomide  (TEMODAR ) 20 MG capsule Take 1 capsule (20 mg total) by mouth daily. (Take with ONE 140 mg capsule for total daily dose of 160 mg). May take on an empty stomach to decrease nausea & vomiting. Patient not taking: Reported on 11/08/2023 10/14/23   Vaslow, Zachary K, MD    Blood pressure 114/77, pulse 76, SpO2 93%. Exam: General: Communicates without difficulty, well nourished, no acute distress. Head: Normocephalic, no evidence injury, no tenderness, facial buttresses intact without stepoff. Face/sinus: No tenderness to palpation and percussion. Facial movement is normal and symmetric. Eyes: PERRL, EOMI. No scleral icterus, conjunctivae clear. Neuro: CN II exam reveals vision grossly intact.  No nystagmus at any point of gaze. Ears: Auricles well formed without lesions.  Bilateral cerumen impaction.  Nose: External evaluation reveals normal support and skin without lesions.  Dorsum is intact.  Anterior rhinoscopy reveals congested mucosa over anterior aspect of inferior turbinates and intact septum.  No purulence noted. Oral:  Oral cavity and oropharynx are intact, symmetric, without erythema or edema.  Mucosa is moist without lesions. Neck: Full range of motion without pain.  There is no significant lymphadenopathy.  No masses palpable.  Thyroid  bed within normal limits to palpation.  Parotid glands and submandibular glands equal bilaterally without mass.  Trachea is midline. Neuro:  CN 2-12 grossly intact. Vestibular: No nystagmus at any point of gaze. Dix Hallpike negative. Vestibular: There is no nystagmus with pneumatic pressure on either tympanic membrane or Valsalva. The cerebellar examination is unremarkable.   Procedure: Bilateral cerumen disimpaction Anesthesia: None Description: Under the operating microscope, the cerumen is carefully removed with a combination of cerumen currette, alligator forceps, and suction catheters.  After the cerumen is removed, the TMs  are noted to be normal.  No mass, erythema, or lesions. The patient tolerated the procedure well.    Assessment: 1.  His recent onset dizziness, right ear pain, and right ear hearing loss may be secondary to his treatment for his right temporal lobe glioblastoma.  He underwent craniotomy surgery in July 2025, and is currently receiving chemoradiation treatment. 2.  Bilateral cerumen impaction.  After the disimpaction procedure, both tympanic membranes and middle ear spaces are noted to be normal. 3.  The patient has a history of bilateral sensorineural hearing loss, worse on the left side. 4.  His Dix-Hallpike maneuver is negative.  Plan: 1.  Otomicroscopy with bilateral cerumen disimpaction. 2.  The pathophysiology of vestibular dysfunction and dizziness are discussed extensively with the patient. The possible differential diagnoses are reviewed. Questions are invited and answered.  3.  It is possible that the patient's hearing will progressively worsen with his continuing chemoradiation treatment. 4.  The patient is scheduled to undergo physical/balance therapy after completing his chemoradiation treatment. 5.  Outpatient hearing test.  No audiologist is available today. 6.  The patient will return for reevaluation in 3 months, sooner if needed.   Ailish Prospero W Ary Lavine 11/09/2023, 8:53 AM

## 2023-11-09 NOTE — Progress Notes (Signed)
 [facility] [address]  [place], [place] [address] [phone]   Interval Evaluation  Date of Service: 11/09/23 Patient Name: Joel Williams Patient MRN: [id number] Patient DOB: 05-12-1950 Provider: Arthea MARLA Manns, MD  Identifying Statement:  Joel Williams is a 73 y.o. male with right temporal glioblastoma    Oncologic History: Oncology History  Glioblastoma, IDH-wildtype (HCC)  09/17/2023 Initial Diagnosis   Glioblastoma, IDH-wildtype (HCC)   10/14/2023 -  Chemotherapy   Patient is on Treatment Plan : BRAIN GLIOBLASTOMA Radiation Therapy With Concurrent Temozolomide  75 mg/m2 Daily Followed By Sequential Maintenance Temozolomide  x 6-12 cycles       Biomarkers:  MGMT Unknown.  IDH 1/2 Unknown.  EGFR Unknown  TERT Unknown   Interval History: Joel Williams presents today for follow up, now having completed 2 weeks of radiation and Temodar .  He is doing well with treatment so far, no complications.  Headaches have mostly resolved.  He denies seizures, gait difficulty.  Fatigue remains mild.  H+P (10/07/23) Patient presented to neurologic attention last month with several weeks of confusion, fatigue, lethargy, visual complaints.  MRI brain was obtained which demonstrated a large enhancing mass in the right temporal lobe, consistent with primary brain tumor.  He underwent craniotomy, resection with Dr. Lanis on 09/16/23; path demonstrated glioblastoma.  Since surgery he has been doing well, walks on his own, independent with activities of daily living.  He has been having daily headaches, dosing Tylenol , Fioricet  and Topamax  25mg  twice per day for prevention.  Medications: Current Outpatient Medications on File Prior to Visit  Medication Sig Dispense Refill   albuterol  (VENTOLIN  HFA) 108 (90 Base) MCG/ACT inhaler Inhale 1-2 puffs into the lungs every 6 (six) hours as needed for wheezing or shortness of breath. 6.7 g 0    amLODipine  (NORVASC ) 10 MG tablet Take 1 tablet (10 mg total) by mouth at bedtime. 30 tablet 0   aspirin  EC 81 MG tablet Take 1 tablet (81 mg total) by mouth daily. Swallow whole.     Continuous Glucose Sensor (FREESTYLE LIBRE 3 PLUS SENSOR) MISC apply to upper back of arm for 90 days change sensor every 15 days IC-10 CODE: E11.9     insulin  aspart (NOVOLOG ) 100 UNIT/ML injection Inject 0-20 Units into the skin 4 (four) times daily -  before meals and at bedtime. CBG 70 - 120: 0 units CBG 121 - 150: 3 units CBG 151 - 200: 4 units CBG 201 - 250: 7 units CBG 251 - 300: 11 units CBG 301 - 350: 15 units CBG 351 - 400: 20 units CBG > 400: call MD and obtain STAT lab verification     insulin  aspart (NOVOLOG ) 100 UNIT/ML injection Inject 5 Units into the skin with breakfast, with lunch, and with evening meal.     irbesartan  (AVAPRO ) 150 MG tablet Take 1 tablet (150 mg total) by mouth daily. 30 tablet 0   loratadine  (CLARITIN ) 10 MG tablet Take 10 mg by mouth daily.     metFORMIN  (GLUCOPHAGE -XR) 500 MG 24 hr tablet Take 2 tablets (1,000 mg total) by mouth 2 (two) times daily. 120 tablet 0   metoprolol  succinate (TOPROL -XL) 50 MG 24 hr tablet Take 1 tablet (50 mg total) by mouth daily. Take with or immediately following a meal. 30 tablet 0   pantoprazole  (PROTONIX ) 40 MG tablet Take 1 tablet (40 mg total) by mouth at bedtime. 90 tablet 3   simvastatin  (ZOCOR ) 20 MG  tablet Take 1 tablet (20 mg total) by mouth daily. 30 tablet 0   temozolomide  (TEMODAR ) 140 MG capsule Take 1 capsule (140 mg total) by mouth daily. (Take with ONE 20 mg capsule for total daily dose of 160 mg). May take on an empty stomach to decrease nausea & vomiting. 42 capsule 0   temozolomide  (TEMODAR ) 20 MG capsule Take 1 capsule (20 mg total) by mouth daily. (Take with ONE 140 mg capsule for total daily dose of 160 mg). May take on an empty stomach to decrease nausea & vomiting. 42 capsule 0   No current facility-administered medications on  file prior to visit.    Allergies:  Allergies  Allergen Reactions   Invokana [Canagliflozin]     Yeast infections   Trulicity [Dulaglutide] Other (See Comments)    yeast infections   Past Medical History:  Past Medical History:  Diagnosis Date   Allergic rhinitis    Arthritis    ASTHMA 06/08/2008   Asthma    as a child   CAD (coronary artery disease)    a. s/p Promus DES (3.0 x 16 mm) to mid LAD 05/16/10;  b. Myoview  05/15/10: anteroseptal ischemia with EF 52%;   c. Cath 05/16/10: single vessel CAD with mLAD 90% tx with PCI and EF 50%;  d. 07/2011 Cath:  Patent stent, nonobs dzs, NL EF.   Chronic kidney disease    was seen by Dr. Lonna and released.   Colonic polyp    DIABETES MELLITUS, TYPE II 06/08/2008   GERD (gastroesophageal reflux disease)    Hemorrhoids    HYPERLIPIDEMIA 06/08/2008   HYPERTENSION 06/08/2008   Impotence of organic origin 08/24/2008   Myocardial infarction (HCC) 04/16/2010   Pneumonia    hx of   Renal vein thrombosis (HCC)    Seasonal allergies    Status post craniotomy 09/14/2023   Past Surgical History:  Past Surgical History:  Procedure Laterality Date   APPLICATION OF CRANIAL NAVIGATION Right 09/14/2023   Procedure: COMPUTER-ASSISTED NAVIGATION, FOR CRANIAL PROCEDURE;  Surgeon: Lanis Pupa, MD;  Location: [facility] OR;  Service: Neurosurgery;  Laterality: Right;   BACK SURGERY  2011   CATARACT EXTRACTION Bilateral    2024   CORONARY ANGIOPLASTY WITH STENT PLACEMENT     CRANIOTOMY Right 09/14/2023   Procedure: STEREOTACTIC RIGHT TEMPRO-OCCIPITAL CRANIOTOMY FOR RESECTION OF TUMOR;  Surgeon: Lanis Pupa, MD;  Location: [facility] OR;  Service: Neurosurgery;  Laterality: Right;   LEFT HEART CATHETERIZATION WITH CORONARY ANGIOGRAM N/A 07/29/2011   Procedure: LEFT HEART CATHETERIZATION WITH CORONARY ANGIOGRAM;  Surgeon: Lonni JONETTA Cash, MD;  Location: [facility] CATH LAB;  Service: Cardiovascular;  Laterality: N/A;   LEFT HEART CATHETERIZATION WITH  CORONARY ANGIOGRAM N/A 06/14/2013   Procedure: LEFT HEART CATHETERIZATION WITH CORONARY ANGIOGRAM;  Surgeon: Lonni JONETTA Cash, MD;  Location: [facility] CATH LAB;  Service: Cardiovascular;  Laterality: N/A;   RADIOLOGY WITH ANESTHESIA N/A 06/09/2022   Procedure: MRI WITH ANESTHESIA OF LUMBAR SPINE WITHOUT CONTRAST;  Surgeon: Radiologist, Medication, MD;  Location: [facility] OR;  Service: Radiology;  Laterality: N/A;   Social History:  Social History   Socioeconomic History   Marital status: Married    Spouse name: Not on file   Number of children: 2   Years of education: Not on file   Highest education level: Not on file  Occupational History   Occupation: Sales  Tobacco Use   Smoking status: Former    Current packs/day: 0.00    Average packs/day: 2.0 packs/day for 53.0  years (106.0 ttl pk-yrs)    Types: Cigarettes    Start date: 05/22/1950    Quit date: 05/23/2002    Years since quitting: 21.4   Smokeless tobacco: Never  Vaping Use   Vaping status: Never Used  Substance and Sexual Activity   Alcohol use: Not Currently    Comment: Ocassional since starting Ozempic - fall 2023.   Drug use: No   Sexual activity: Not Currently  Other Topics Concern   Not on file  Social History Narrative   Not on file   Social Drivers of Health   Financial Resource Strain: Low Risk  (12/25/2022)   Overall Financial Resource Strain (CARDIA)    Difficulty of Paying Living Expenses: Not hard at all  Food Insecurity: No Food Insecurity (10/07/2023)   Hunger Vital Sign    Worried About Running Out of Food in the Last Year: Never true    Ran Out of Food in the Last Year: Never true  Transportation Needs: No Transportation Needs (10/07/2023)   PRAPARE - Administrator, Civil Service (Medical): No    Lack of Transportation (Non-Medical): No  Physical Activity: Insufficiently Active (12/25/2022)   Exercise Vital Sign    Days of Exercise per Week: 2 days    Minutes of Exercise per Session: 30 min   Stress: No Stress Concern Present (12/25/2022)   [employer] of Occupational Health - Occupational Stress Questionnaire    Feeling of Stress : Not at all  Social Connections: Socially Integrated (09/14/2023)   Social Connection and Isolation Panel    Frequency of Communication with Friends and Family: More than three times a week    Frequency of Social Gatherings with Friends and Family: More than three times a week    Attends Religious Services: More than 4 times per year    Active Member of [employer] or Organizations: Yes    Attends Engineer, structural: More than 4 times per year    Marital Status: Married  Catering manager Violence: Not At Risk (10/07/2023)   Humiliation, Afraid, Rape, and Kick questionnaire    Fear of Current or Ex-Partner: No    Emotionally Abused: No    Physically Abused: No    Sexually Abused: No   Family History:  Family History  Problem Relation Age of Onset   Diabetes Father    Heart disease Father    Angina Father    Dementia Mother     Review of Systems: Constitutional: Doesn't report fevers, chills or abnormal weight loss Eyes: Doesn't report blurriness of vision Ears, nose, mouth, throat, and face: Doesn't report sore throat Respiratory: Doesn't report cough, dyspnea or wheezes Cardiovascular: Doesn't report palpitation, chest discomfort  Gastrointestinal:  Doesn't report nausea, constipation, diarrhea GU: Doesn't report incontinence Skin: Doesn't report skin rashes Neurological: Per HPI Musculoskeletal: Doesn't report joint pain Behavioral/Psych: Doesn't report anxiety  Physical Exam: Vitals:   11/09/23 1138  BP: 129/60  Pulse: 70  Resp: 16  Temp: 97.7 F (36.5 C)  SpO2: 98%   KPS: 90. General: Alert, cooperative, pleasant, in no acute distress Head: Normal EENT: No conjunctival injection or scleral icterus.  Lungs: Resp effort normal Cardiac: Regular rate Abdomen: Non-distended abdomen Skin: No rashes cyanosis or  petechiae. Extremities: No clubbing or edema  Neurologic Exam: Mental Status: Awake, alert, attentive to examiner. Oriented to self and environment. Language is fluent with intact comprehension.  Cranial Nerves: Visual acuity is grossly normal. Visual fields are full. Extra-ocular movements intact. No ptosis.  Face is symmetric Motor: Tone and bulk are normal. Power is full in both arms and legs. Reflexes are symmetric, no pathologic reflexes present.  Sensory: Intact to light touch Gait: Normal.   Labs: I have reviewed the data as listed    Component Value Date/Time   NA 141 11/09/2023 1042   NA 137 07/16/2017 0000   K 4.8 11/09/2023 1042   CL 105 11/09/2023 1042   CO2 32 11/09/2023 1042   GLUCOSE 132 (H) 11/09/2023 1042   BUN 13 11/09/2023 1042   BUN 16 07/16/2017 0000   CREATININE 0.84 11/09/2023 1042   CREATININE 0.72 02/24/2016 0806   CALCIUM 9.3 11/09/2023 1042   PROT 6.5 11/09/2023 1042   PROT 6.8 08/19/2023 0940   ALBUMIN 4.0 11/09/2023 1042   AST 17 11/09/2023 1042   ALT 21 11/09/2023 1042   ALKPHOS 64 11/09/2023 1042   BILITOT 0.8 11/09/2023 1042   GFRNONAA >60 11/09/2023 1042   GFRAA >90 07/29/2011 1120   Lab Results  Component Value Date   WBC 6.5 11/09/2023   NEUTROABS 3.6 11/09/2023   HGB 13.2 11/09/2023   HCT 39.0 11/09/2023   MCV 82.1 11/09/2023   PLT 176 11/09/2023      Assessment/Plan Glioblastoma, IDH-wildtype (HCC)  JONG RICKMAN is clinically stable today, now having completed 2 weeks of IMRT and concurrent Temodar .  We ultimately recommended continuing with course of intensity modulated radiation therapy and concurrent daily Temozolomide .  Radiation will be administered Mon-Fri over 6 weeks, Temodar  will be dosed at 75mg /m2 to be given daily over 42 days.  We reviewed side effects of temodar , including fatigue, nausea/vomiting, constipation, and cytopenias.  Informed consent was verbally obtained at bedside to proceed with oral  chemotherapy.  Chemotherapy should be held for the following:  ANC less than 1,000  Platelets less than 100,000  LFT or creatinine greater than 2x ULN  If clinical concerns/contraindications develop  Every 2 weeks during radiation, labs will be checked accompanied by a clinical evaluation in the brain tumor clinic.  We also discussed and patient consented for additional tumor profiling and sequencing through [facility].  Advanced tumor profiling could help identify actionable mutation for targeted therapy and lead to direct clinical benefit.     All questions were answered. The patient knows to call the clinic with any problems, questions or concerns. No barriers to learning were detected.  The total time spent in the encounter was 30 minutes and more than 50% was on counseling and review of test results   Arthea MARLA Manns, MD Medical Director of Neuro-Oncology [facility] 11/09/23 11:54 AM

## 2023-11-10 ENCOUNTER — Ambulatory Visit: Admitting: Physical Medicine and Rehabilitation

## 2023-11-10 ENCOUNTER — Encounter: Payer: Self-pay | Admitting: Internal Medicine

## 2023-11-10 ENCOUNTER — Other Ambulatory Visit (HOSPITAL_COMMUNITY)

## 2023-11-10 ENCOUNTER — Other Ambulatory Visit: Payer: Self-pay

## 2023-11-10 ENCOUNTER — Ambulatory Visit
Admission: RE | Admit: 2023-11-10 | Discharge: 2023-11-10 | Disposition: A | Discharge status: Home / Self Care | Source: Ambulatory Visit | Attending: Radiation Oncology | Admitting: Radiation Oncology

## 2023-11-10 DIAGNOSIS — Z510 Encounter for antineoplastic radiation therapy: Secondary | ICD-10-CM | POA: Diagnosis not present

## 2023-11-10 DIAGNOSIS — C712 Malignant neoplasm of temporal lobe: Secondary | ICD-10-CM | POA: Diagnosis not present

## 2023-11-10 LAB — RAD ONC ARIA SESSION SUMMARY
Course Elapsed Days: 15
Plan Fractions Treated to Date: 12
Plan Prescribed Dose Per Fraction: 2 Gy
Plan Total Fractions Prescribed: 23
Plan Total Prescribed Dose: 46 Gy
Reference Point Dosage Given to Date: 24 Gy
Reference Point Session Dosage Given: 2 Gy
Session Number: 12

## 2023-11-11 ENCOUNTER — Ambulatory Visit: Admitting: Physical Therapy

## 2023-11-11 ENCOUNTER — Other Ambulatory Visit: Payer: Self-pay

## 2023-11-11 ENCOUNTER — Ambulatory Visit

## 2023-11-11 ENCOUNTER — Inpatient Hospital Stay: Admitting: Internal Medicine

## 2023-11-11 ENCOUNTER — Inpatient Hospital Stay

## 2023-11-11 ENCOUNTER — Ambulatory Visit
Admission: RE | Admit: 2023-11-11 | Discharge: 2023-11-11 | Disposition: A | Discharge status: Home / Self Care | Source: Ambulatory Visit | Attending: Radiation Oncology

## 2023-11-11 DIAGNOSIS — Z510 Encounter for antineoplastic radiation therapy: Secondary | ICD-10-CM | POA: Diagnosis not present

## 2023-11-11 DIAGNOSIS — C712 Malignant neoplasm of temporal lobe: Secondary | ICD-10-CM | POA: Diagnosis not present

## 2023-11-11 LAB — RAD ONC ARIA SESSION SUMMARY
Course Elapsed Days: 16
Plan Fractions Treated to Date: 13
Plan Prescribed Dose Per Fraction: 2 Gy
Plan Total Fractions Prescribed: 23
Plan Total Prescribed Dose: 46 Gy
Reference Point Dosage Given to Date: 26 Gy
Reference Point Session Dosage Given: 2 Gy
Session Number: 13

## 2023-11-12 ENCOUNTER — Ambulatory Visit
Admission: RE | Admit: 2023-11-12 | Discharge: 2023-11-12 | Disposition: A | Discharge status: Home / Self Care | Source: Ambulatory Visit | Attending: Radiation Oncology | Admitting: Radiation Oncology

## 2023-11-12 ENCOUNTER — Other Ambulatory Visit: Payer: Self-pay

## 2023-11-12 ENCOUNTER — Other Ambulatory Visit: Payer: Self-pay | Admitting: Internal Medicine

## 2023-11-12 ENCOUNTER — Other Ambulatory Visit (HOSPITAL_COMMUNITY): Payer: Self-pay

## 2023-11-12 ENCOUNTER — Other Ambulatory Visit: Payer: Self-pay | Admitting: Pharmacy Technician

## 2023-11-12 DIAGNOSIS — C719 Malignant neoplasm of brain, unspecified: Secondary | ICD-10-CM

## 2023-11-12 DIAGNOSIS — C712 Malignant neoplasm of temporal lobe: Secondary | ICD-10-CM | POA: Diagnosis not present

## 2023-11-12 DIAGNOSIS — Z510 Encounter for antineoplastic radiation therapy: Secondary | ICD-10-CM | POA: Diagnosis not present

## 2023-11-12 LAB — RAD ONC ARIA SESSION SUMMARY
Course Elapsed Days: 17
Plan Fractions Treated to Date: 14
Plan Prescribed Dose Per Fraction: 2 Gy
Plan Total Fractions Prescribed: 23
Plan Total Prescribed Dose: 46 Gy
Reference Point Dosage Given to Date: 28 Gy
Reference Point Session Dosage Given: 2 Gy
Session Number: 14

## 2023-11-12 MED ORDER — ONDANSETRON HCL 8 MG PO TABS
8.0000 mg | ORAL_TABLET | Freq: Three times a day (TID) | ORAL | 1 refills | Status: AC | PRN
  Filled 2023-11-12 – 2023-11-19 (×3): qty 30, 10d supply, fill #0

## 2023-11-12 NOTE — Progress Notes (Signed)
 Specialty Pharmacy Refill Coordination Note  Joel Williams is a 73 y.o. male contacted today regarding refills of specialty medication(s) Temozolomide  (TEMODAR )   Patient requested Delivery   Delivery date: 11/18/23   Verified address: [address]   [place] [address]   -pls call when delivering   Medication will be filled on 11/17/23.  Spoke to patient's spouse; reported 2 boxes of meds and therapy will end on 12/07/23.

## 2023-11-16 ENCOUNTER — Ambulatory Visit
Admission: RE | Admit: 2023-11-16 | Discharge: 2023-11-16 | Disposition: A | Discharge status: Home / Self Care | Source: Ambulatory Visit | Attending: Radiation Oncology | Admitting: Radiation Oncology

## 2023-11-16 ENCOUNTER — Other Ambulatory Visit: Payer: Self-pay

## 2023-11-16 DIAGNOSIS — C712 Malignant neoplasm of temporal lobe: Secondary | ICD-10-CM | POA: Diagnosis not present

## 2023-11-16 DIAGNOSIS — Z510 Encounter for antineoplastic radiation therapy: Secondary | ICD-10-CM | POA: Diagnosis not present

## 2023-11-16 LAB — RAD ONC ARIA SESSION SUMMARY
Course Elapsed Days: 21
Plan Fractions Treated to Date: 15
Plan Prescribed Dose Per Fraction: 2 Gy
Plan Total Fractions Prescribed: 23
Plan Total Prescribed Dose: 46 Gy
Reference Point Dosage Given to Date: 30 Gy
Reference Point Session Dosage Given: 2 Gy
Session Number: 15

## 2023-11-17 ENCOUNTER — Ambulatory Visit
Admission: RE | Admit: 2023-11-17 | Discharge: 2023-11-17 | Disposition: A | Discharge status: Home / Self Care | Source: Ambulatory Visit | Attending: Radiation Oncology | Admitting: Radiation Oncology

## 2023-11-17 ENCOUNTER — Ambulatory Visit

## 2023-11-17 ENCOUNTER — Other Ambulatory Visit (HOSPITAL_COMMUNITY): Payer: Self-pay

## 2023-11-17 ENCOUNTER — Other Ambulatory Visit: Payer: Self-pay

## 2023-11-17 ENCOUNTER — Encounter

## 2023-11-17 ENCOUNTER — Encounter: Admitting: Occupational Therapy

## 2023-11-17 DIAGNOSIS — Z510 Encounter for antineoplastic radiation therapy: Secondary | ICD-10-CM | POA: Diagnosis not present

## 2023-11-17 DIAGNOSIS — C712 Malignant neoplasm of temporal lobe: Secondary | ICD-10-CM | POA: Diagnosis not present

## 2023-11-17 LAB — RAD ONC ARIA SESSION SUMMARY
Course Elapsed Days: 22
Plan Fractions Treated to Date: 16
Plan Prescribed Dose Per Fraction: 2 Gy
Plan Total Fractions Prescribed: 23
Plan Total Prescribed Dose: 46 Gy
Reference Point Dosage Given to Date: 32 Gy
Reference Point Session Dosage Given: 2 Gy
Session Number: 16

## 2023-11-18 ENCOUNTER — Other Ambulatory Visit: Payer: Self-pay

## 2023-11-18 ENCOUNTER — Inpatient Hospital Stay: Admitting: Internal Medicine

## 2023-11-18 ENCOUNTER — Inpatient Hospital Stay

## 2023-11-18 ENCOUNTER — Ambulatory Visit
Admission: RE | Admit: 2023-11-18 | Discharge: 2023-11-18 | Disposition: A | Discharge status: Home / Self Care | Source: Ambulatory Visit | Attending: Radiation Oncology | Admitting: Radiation Oncology

## 2023-11-18 DIAGNOSIS — C712 Malignant neoplasm of temporal lobe: Secondary | ICD-10-CM | POA: Diagnosis not present

## 2023-11-18 DIAGNOSIS — Z510 Encounter for antineoplastic radiation therapy: Secondary | ICD-10-CM | POA: Diagnosis not present

## 2023-11-18 LAB — RAD ONC ARIA SESSION SUMMARY
Course Elapsed Days: 23
Plan Fractions Treated to Date: 17
Plan Prescribed Dose Per Fraction: 2 Gy
Plan Total Fractions Prescribed: 23
Plan Total Prescribed Dose: 46 Gy
Reference Point Dosage Given to Date: 34 Gy
Reference Point Session Dosage Given: 2 Gy
Session Number: 17

## 2023-11-19 ENCOUNTER — Ambulatory Visit
Admission: RE | Admit: 2023-11-19 | Discharge: 2023-11-19 | Disposition: A | Discharge status: Home / Self Care | Source: Ambulatory Visit | Attending: Radiation Oncology | Admitting: Radiation Oncology

## 2023-11-19 ENCOUNTER — Other Ambulatory Visit: Payer: Self-pay

## 2023-11-19 DIAGNOSIS — Z510 Encounter for antineoplastic radiation therapy: Secondary | ICD-10-CM | POA: Diagnosis not present

## 2023-11-19 DIAGNOSIS — C712 Malignant neoplasm of temporal lobe: Secondary | ICD-10-CM | POA: Diagnosis not present

## 2023-11-19 LAB — RAD ONC ARIA SESSION SUMMARY
Course Elapsed Days: 24
Plan Fractions Treated to Date: 18
Plan Prescribed Dose Per Fraction: 2 Gy
Plan Total Fractions Prescribed: 23
Plan Total Prescribed Dose: 46 Gy
Reference Point Dosage Given to Date: 36 Gy
Reference Point Session Dosage Given: 2 Gy
Session Number: 18

## 2023-11-22 ENCOUNTER — Ambulatory Visit
Admission: RE | Admit: 2023-11-22 | Discharge: 2023-11-22 | Disposition: A | Discharge status: Home / Self Care | Source: Ambulatory Visit | Attending: Radiation Oncology | Admitting: Radiation Oncology

## 2023-11-22 ENCOUNTER — Encounter

## 2023-11-22 ENCOUNTER — Other Ambulatory Visit: Payer: Self-pay

## 2023-11-22 ENCOUNTER — Ambulatory Visit: Admitting: Physical Therapy

## 2023-11-22 ENCOUNTER — Encounter: Admitting: Occupational Therapy

## 2023-11-22 DIAGNOSIS — C712 Malignant neoplasm of temporal lobe: Secondary | ICD-10-CM | POA: Diagnosis not present

## 2023-11-22 DIAGNOSIS — Z510 Encounter for antineoplastic radiation therapy: Secondary | ICD-10-CM | POA: Diagnosis not present

## 2023-11-22 LAB — RAD ONC ARIA SESSION SUMMARY
Course Elapsed Days: 27
Plan Fractions Treated to Date: 19
Plan Prescribed Dose Per Fraction: 2 Gy
Plan Total Fractions Prescribed: 23
Plan Total Prescribed Dose: 46 Gy
Reference Point Dosage Given to Date: 38 Gy
Reference Point Session Dosage Given: 2 Gy
Session Number: 19

## 2023-11-22 NOTE — Progress Notes (Signed)
 Specialty Pharmacy Ongoing Clinical Assessment Note  I spoke with the patient's wife. Joel Williams is a 73 y.o. male who is being followed by the specialty pharmacy service for RxSp Oncology   Patient's specialty medication(s) reviewed today: Temozolomide  (TEMODAR )   Missed doses in the last 4 weeks: 0   Patient/Caregiver did not have any additional questions or concerns.   Therapeutic benefit summary: Patient is achieving benefit (Dr. Buckley documented that the patient was clinical stable at his office visit on 11/09/23.)   Adverse events/side effects summary: Experienced adverse events/side effects (Patient's wife reported that he was experiencing fatigue and mild loss of appetite, both remain tolerable at this time.)   Patient's therapy is appropriate to: Continue    Goals Addressed             This Visit's Progress    Slow Disease Progression   On track    Patient is on track. Patient will maintain adherence. Dr. Buckley documented that the patient was clinical stable at his office visit on 11/09/23.          Follow up: 3 months  Silvano LOISE Dolly Specialty Pharmacist

## 2023-11-23 ENCOUNTER — Inpatient Hospital Stay: Attending: Neurosurgery

## 2023-11-23 ENCOUNTER — Ambulatory Visit
Admission: RE | Admit: 2023-11-23 | Discharge: 2023-11-23 | Disposition: A | Discharge status: Home / Self Care | Source: Ambulatory Visit | Attending: Radiation Oncology | Admitting: Radiation Oncology

## 2023-11-23 ENCOUNTER — Encounter: Payer: Self-pay | Admitting: Student in an Organized Health Care Education/Training Program

## 2023-11-23 ENCOUNTER — Ambulatory Visit: Admitting: Student in an Organized Health Care Education/Training Program

## 2023-11-23 ENCOUNTER — Other Ambulatory Visit: Payer: Self-pay

## 2023-11-23 ENCOUNTER — Inpatient Hospital Stay (HOSPITAL_BASED_OUTPATIENT_CLINIC_OR_DEPARTMENT_OTHER): Admitting: Internal Medicine

## 2023-11-23 VITALS — BP 123/56 | HR 72 | Wt 227.0 lb

## 2023-11-23 VITALS — BP 130/61 | HR 73 | Temp 97.5°F | Resp 20 | Wt 224.1 lb

## 2023-11-23 DIAGNOSIS — I10 Essential (primary) hypertension: Secondary | ICD-10-CM

## 2023-11-23 DIAGNOSIS — C719 Malignant neoplasm of brain, unspecified: Secondary | ICD-10-CM | POA: Diagnosis not present

## 2023-11-23 DIAGNOSIS — Z510 Encounter for antineoplastic radiation therapy: Secondary | ICD-10-CM | POA: Diagnosis not present

## 2023-11-23 DIAGNOSIS — H1032 Unspecified acute conjunctivitis, left eye: Secondary | ICD-10-CM

## 2023-11-23 DIAGNOSIS — R5383 Other fatigue: Secondary | ICD-10-CM | POA: Insufficient documentation

## 2023-11-23 DIAGNOSIS — Z794 Long term (current) use of insulin: Secondary | ICD-10-CM | POA: Diagnosis not present

## 2023-11-23 DIAGNOSIS — E119 Type 2 diabetes mellitus without complications: Secondary | ICD-10-CM

## 2023-11-23 DIAGNOSIS — K219 Gastro-esophageal reflux disease without esophagitis: Secondary | ICD-10-CM | POA: Diagnosis not present

## 2023-11-23 DIAGNOSIS — C712 Malignant neoplasm of temporal lobe: Secondary | ICD-10-CM | POA: Diagnosis not present

## 2023-11-23 DIAGNOSIS — Z23 Encounter for immunization: Secondary | ICD-10-CM

## 2023-11-23 DIAGNOSIS — Z79630 Long term (current) use of alkylating agent: Secondary | ICD-10-CM | POA: Insufficient documentation

## 2023-11-23 DIAGNOSIS — H109 Unspecified conjunctivitis: Secondary | ICD-10-CM | POA: Insufficient documentation

## 2023-11-23 LAB — CMP (CANCER CENTER ONLY)
ALT: 21 U/L (ref 0–44)
AST: 17 U/L (ref 15–41)
Albumin: 4.1 g/dL (ref 3.5–5.0)
Alkaline Phosphatase: 64 U/L (ref 38–126)
Anion gap: 6 (ref 5–15)
BUN: 18 mg/dL (ref 8–23)
CO2: 29 mmol/L (ref 22–32)
Calcium: 9.8 mg/dL (ref 8.9–10.3)
Chloride: 107 mmol/L (ref 98–111)
Creatinine: 0.93 mg/dL (ref 0.61–1.24)
GFR, Estimated: >60 mL/min (ref >60)
Glucose, Bld: 143 mg/dL — ABNORMAL HIGH (ref 70–99)
Potassium: 5.2 mmol/L — ABNORMAL HIGH (ref 3.5–5.1)
Sodium: 142 mmol/L (ref 135–145)
Total Bilirubin: 0.6 mg/dL (ref 0.0–1.2)
Total Protein: 6.9 g/dL (ref 6.5–8.1)

## 2023-11-23 LAB — CBC WITH DIFFERENTIAL (CANCER CENTER ONLY)
Abs Immature Granulocytes: 0.01 K/uL (ref 0.00–0.07)
Basophils Absolute: 0.1 K/uL (ref 0.0–0.1)
Basophils Relative: 1 %
Eosinophils Absolute: 0.4 K/uL (ref 0.0–0.5)
Eosinophils Relative: 7 %
HCT: 38.9 % — ABNORMAL LOW (ref 39.0–52.0)
Hemoglobin: 13.3 g/dL (ref 13.0–17.0)
Immature Granulocytes: 0 %
Lymphocytes Relative: 23 %
Lymphs Abs: 1.2 K/uL (ref 0.7–4.0)
MCH: 28.1 pg (ref 26.0–34.0)
MCHC: 34.2 g/dL (ref 30.0–36.0)
MCV: 82.1 fL (ref 80.0–100.0)
Monocytes Absolute: 0.7 K/uL (ref 0.1–1.0)
Monocytes Relative: 14 %
Neutro Abs: 2.9 K/uL (ref 1.7–7.7)
Neutrophils Relative %: 55 %
Platelet Count: 203 K/uL (ref 150–400)
RBC: 4.74 MIL/uL (ref 4.22–5.81)
RDW: 14.6 % (ref 11.5–15.5)
WBC Count: 5.3 K/uL (ref 4.0–10.5)
nRBC: 0 % (ref 0.0–0.2)

## 2023-11-23 LAB — RAD ONC ARIA SESSION SUMMARY
Course Elapsed Days: 28
Plan Fractions Treated to Date: 20
Plan Prescribed Dose Per Fraction: 2 Gy
Plan Total Fractions Prescribed: 23
Plan Total Prescribed Dose: 46 Gy
Reference Point Dosage Given to Date: 40 Gy
Reference Point Session Dosage Given: 2 Gy
Session Number: 20

## 2023-11-23 MED ORDER — POLYMYXIN B-TRIMETHOPRIM 10000-0.1 UNIT/ML-% OP SOLN: 1.0000 [drp] | Freq: Four times a day (QID) | OPHTHALMIC | 0 refills | Status: AC

## 2023-11-23 MED ORDER — PANTOPRAZOLE SODIUM 40 MG PO TBEC: 40.0000 mg | DELAYED_RELEASE_TABLET | Freq: Every day | ORAL | 3 refills | Status: DC

## 2023-11-23 NOTE — Assessment & Plan Note (Signed)
 Chronic and stable.  Blood sugars is doing very well at home.  Continues to use an insulin  pump but has not needed to make any adjustments since starting chemotherapy.

## 2023-11-23 NOTE — Progress Notes (Signed)
 Established Patient Office Visit  Subjective   Patient ID: Joel Williams, male    DOB: 04-Apr-1950  Age: 73 y.o. MRN: [id number]  Chief Complaint  Patient presents with   Medical Management of Chronic Issues    4 week follow up     HPI  73 year old person with glioblastoma recently started chemotherapy with Temodar  and augmenting treatment with radiation therapy to the brain 5 days/week.  I am seeing him today to check-in on his other medical conditions including diabetes and hypertension, wanted to see if there were any changes in these medications needed since starting chemotherapy.  Thankfully he is doing very well, appetite is good, eating and drinking well.  Has not had much nausea or vomiting.  Has had increasing fatigue.  He is not noticing any issues with hypoglycemia.  No issues with lightheadedness on standing.  Has noted a red and crusting in his eye on the left side over the last 1 week.  No fevers or chills.  He is avoiding grandchildren because of infection risk.  No changes in his medications.    Objective:     BP (!) 123/56   Pulse 72   Wt 227 lb (103 kg)   SpO2 97%   BMI 33.52 kg/m   Physical Exam   Gen: Well-appearing man Eyes: Left eye is erythematous with mild crusting along the eyelashes, mild ptosis on the left Ears: Normal tympanic membranes, no cerumen in either ears Heart: Regular, no murmur Lungs: Unlabored, clear throughout Ext: Warm, trace pitting edema both lower extremities with mild chronic stasis changes    Assessment & Plan:   Problem List Items Addressed This Visit       High   Diabetes mellitus without complication (HCC) - Primary (Chronic)   Chronic and stable.  Blood sugars is doing very well at home.  Continues to use an insulin  pump but has not needed to make any adjustments since starting chemotherapy.      Glioblastoma, IDH-wildtype (HCC) (Chronic)   Chronic and stable.  Currently on chemotherapy with Temodar  augmented  with radiation therapy.  Being managed with oncology and radiation oncology.  No issues with neutropenia currently.  Tolerating the Temodar  very well.        Medium    Essential hypertension (Chronic)   Blood pressure well-controlled today.  No issues with orthostatic hypotension.  Still eating and drinking well despite starting chemotherapy.  Will plan to continue amlodipine  and irbesartan .  I do detect 1+ pitting edema in his lower extremities today, probably from the amlodipine .  I recommended starting compression stockings during the day, and increase ambulation.        Low   GERD (gastroesophageal reflux disease)   Chronic and stable.  Tolerating pantoprazole  well with good benefit.  He asked me to refill this to his mail pharmacy which I have done.      Relevant Medications   pantoprazole  (PROTONIX ) 40 MG tablet     Unprioritized   Conjunctivitis   New diagnosis of conjunctivitis mostly on the left side.  Little hard to tell if this is allergic or bacterial.  Given the crusting and thick discharge in the morning, will treat with Polytrim  for 1 week.  He has a chronic issue with dry eyes and recommended using artificial tears over-the-counter for that.  For allergic rhinitis he is using the loratadine  on a daily basis which is helpful.      Relevant Medications   trimethoprim -polymyxin b  (POLYTRIM ) ophthalmic  solution   Other Visit Diagnoses       Needs flu shot       Relevant Orders   Flu vaccine HIGH DOSE PF(Fluzone Trivalent) (Completed)       Return in about 3 months (around 02/22/2024).    Cleatus Debby Specking, MD

## 2023-11-23 NOTE — Assessment & Plan Note (Signed)
 Chronic and stable.  Tolerating pantoprazole  well with good benefit.  He asked me to refill this to his mail pharmacy which I have done.

## 2023-11-23 NOTE — Assessment & Plan Note (Signed)
 Chronic and stable.  Currently on chemotherapy with Temodar  augmented with radiation therapy.  Being managed with oncology and radiation oncology.  No issues with neutropenia currently.  Tolerating the Temodar  very well.

## 2023-11-23 NOTE — Progress Notes (Signed)
 [facility] [address]  [place], [place] [address] [phone]   Interval Evaluation  Date of Service: 11/23/23 Patient Name: Joel Williams Patient MRN: [id number] Patient DOB: 01/27/1951 Provider: Arthea MARLA Manns, MD  Identifying Statement:  Joel Williams is a 73 y.o. male with right temporal glioblastoma    Oncologic History: Oncology History  Glioblastoma, IDH-wildtype (HCC)  09/17/2023 Initial Diagnosis   Glioblastoma, IDH-wildtype (HCC)   10/14/2023 -  Chemotherapy   Patient is on Treatment Plan : BRAIN GLIOBLASTOMA Radiation Therapy With Concurrent Temozolomide  75 mg/m2 Daily Followed By Sequential Maintenance Temozolomide  x 6-12 cycles       Biomarkers:  MGMT Unknown.  IDH 1/2 Unknown.  EGFR Unknown  TERT Unknown   Interval History: Joel Williams presents today for follow up, now having completed 4 weeks of radiation and Temodar .  He is doing well with treatment so far, no complications.  Headaches have mostly resolved.  He denies seizures, gait difficulty.  Fatigue is moderate overall.  H+P (10/07/23) Patient presented to neurologic attention last month with several weeks of confusion, fatigue, lethargy, visual complaints.  MRI brain was obtained which demonstrated a large enhancing mass in the right temporal lobe, consistent with primary brain tumor.  He underwent craniotomy, resection with Dr. Lanis on 09/16/23; path demonstrated glioblastoma.  Since surgery he has been doing well, walks on his own, independent with activities of daily living.  He has been having daily headaches, dosing Tylenol , Fioricet  and Topamax  25mg  twice per day for prevention.  Medications: Current Outpatient Medications on File Prior to Visit  Medication Sig Dispense Refill   albuterol  (VENTOLIN  HFA) 108 (90 Base) MCG/ACT inhaler Inhale 1-2 puffs into the lungs every 6 (six) hours as needed for wheezing or shortness of breath. 6.7 g 0    amLODipine  (NORVASC ) 10 MG tablet Take 1 tablet (10 mg total) by mouth at bedtime. 30 tablet 0   aspirin  EC 81 MG tablet Take 1 tablet (81 mg total) by mouth daily. Swallow whole.     Continuous Glucose Sensor (FREESTYLE LIBRE 3 PLUS SENSOR) MISC apply to upper back of arm for 90 days change sensor every 15 days IC-10 CODE: E11.9     insulin  aspart (NOVOLOG ) 100 UNIT/ML injection Inject 0-20 Units into the skin 4 (four) times daily -  before meals and at bedtime. CBG 70 - 120: 0 units CBG 121 - 150: 3 units CBG 151 - 200: 4 units CBG 201 - 250: 7 units CBG 251 - 300: 11 units CBG 301 - 350: 15 units CBG 351 - 400: 20 units CBG > 400: call MD and obtain STAT lab verification     insulin  aspart (NOVOLOG ) 100 UNIT/ML injection Inject 5 Units into the skin with breakfast, with lunch, and with evening meal.     irbesartan  (AVAPRO ) 150 MG tablet Take 1 tablet (150 mg total) by mouth daily. 30 tablet 0   loratadine  (CLARITIN ) 10 MG tablet Take 10 mg by mouth daily.     metFORMIN  (GLUCOPHAGE -XR) 500 MG 24 hr tablet Take 2 tablets (1,000 mg total) by mouth 2 (two) times daily. 120 tablet 0   metoprolol  succinate (TOPROL -XL) 50 MG 24 hr tablet Take 1 tablet (50 mg total) by mouth daily. Take with or immediately following a meal. 30 tablet 0   ondansetron  (ZOFRAN ) 8 MG tablet Take 1 tablet (8 mg total) by mouth every 8 (eight) hours as needed for nausea or vomiting.  May take 30-60 minutes prior to Temodar  administration if nausea/vomiting occurs as needed. 30 tablet 1   pantoprazole  (PROTONIX ) 40 MG tablet Take 1 tablet (40 mg total) by mouth at bedtime. 90 tablet 3   simvastatin  (ZOCOR ) 20 MG tablet Take 1 tablet (20 mg total) by mouth daily. 30 tablet 0   temozolomide  (TEMODAR ) 140 MG capsule Take 1 capsule (140 mg total) by mouth daily. (Take with ONE 20 mg capsule for total daily dose of 160 mg). May take on an empty stomach to decrease nausea & vomiting. 42 capsule 0   temozolomide  (TEMODAR ) 20 MG capsule  Take 1 capsule (20 mg total) by mouth daily. (Take with ONE 140 mg capsule for total daily dose of 160 mg). May take on an empty stomach to decrease nausea & vomiting. 42 capsule 0   No current facility-administered medications on file prior to visit.    Allergies:  Allergies  Allergen Reactions   Invokana [Canagliflozin]     Yeast infections   Trulicity [Dulaglutide] Other (See Comments)    yeast infections   Past Medical History:  Past Medical History:  Diagnosis Date   Allergic rhinitis    Arthritis    ASTHMA 06/08/2008   Asthma    as a child   CAD (coronary artery disease)    a. s/p Promus DES (3.0 x 16 mm) to mid LAD 05/16/10;  b. Myoview  05/15/10: anteroseptal ischemia with EF 52%;   c. Cath 05/16/10: single vessel CAD with mLAD 90% tx with PCI and EF 50%;  d. 07/2011 Cath:  Patent stent, nonobs dzs, NL EF.   Chronic kidney disease    was seen by Dr. Lonna and released.   Colonic polyp    DIABETES MELLITUS, TYPE II 06/08/2008   GERD (gastroesophageal reflux disease)    Hemorrhoids    HYPERLIPIDEMIA 06/08/2008   HYPERTENSION 06/08/2008   Impotence of organic origin 08/24/2008   Myocardial infarction (HCC) 04/16/2010   Pneumonia    hx of   Renal vein thrombosis (HCC)    Seasonal allergies    Status post craniotomy 09/14/2023   Past Surgical History:  Past Surgical History:  Procedure Laterality Date   APPLICATION OF CRANIAL NAVIGATION Right 09/14/2023   Procedure: COMPUTER-ASSISTED NAVIGATION, FOR CRANIAL PROCEDURE;  Surgeon: Lanis Pupa, MD;  Location: [facility] OR;  Service: Neurosurgery;  Laterality: Right;   BACK SURGERY  2011   CATARACT EXTRACTION Bilateral    2024   CORONARY ANGIOPLASTY WITH STENT PLACEMENT     CRANIOTOMY Right 09/14/2023   Procedure: STEREOTACTIC RIGHT TEMPRO-OCCIPITAL CRANIOTOMY FOR RESECTION OF TUMOR;  Surgeon: Lanis Pupa, MD;  Location: [facility] OR;  Service: Neurosurgery;  Laterality: Right;   LEFT HEART CATHETERIZATION WITH CORONARY  ANGIOGRAM N/A 07/29/2011   Procedure: LEFT HEART CATHETERIZATION WITH CORONARY ANGIOGRAM;  Surgeon: Lonni JONETTA Cash, MD;  Location: [facility] CATH LAB;  Service: Cardiovascular;  Laterality: N/A;   LEFT HEART CATHETERIZATION WITH CORONARY ANGIOGRAM N/A 06/14/2013   Procedure: LEFT HEART CATHETERIZATION WITH CORONARY ANGIOGRAM;  Surgeon: Lonni JONETTA Cash, MD;  Location: [facility] C[facility] LAB;  Service: Cardiovascular;  Laterality: N/A;   RADIOLOGY WITH ANESTHESIA N/A 06/09/2022   Procedure: MRI WITH ANESTHESIA OF LUMBAR SPINE WITHOUT CONTRAST;  Surgeon: Radiologist, Medication, MD;  Location: [facility] OR;  Service: Radiology;  Laterality: N/A;   Social History:  Social History   Socioeconomic History   Marital status: Married    Spouse name: Not on file   Number of children: 2   Years of  education: Not on file   Highest education level: Not on file  Occupational History   Occupation: Sales  Tobacco Use   Smoking status: Former    Current packs/day: 0.00    Average packs/day: 2.0 packs/day for 53.0 years (106.0 ttl pk-yrs)    Types: Cigarettes    Start date: 05/22/1949    Quit date: 05/23/2002    Years since quitting: 21.5   Smokeless tobacco: Never  Vaping Use   Vaping status: Never Used  Substance and Sexual Activity   Alcohol use: Not Currently    Comment: Ocassional since starting Ozempic - fall 2023.   Drug use: No   Sexual activity: Not Currently  Other Topics Concern   Not on file  Social History Narrative   Not on file   Social Drivers of Health   Financial Resource Strain: Low Risk  (12/25/2022)   Overall Financial Resource Strain (CARDIA)    Difficulty of Paying Living Expenses: Not hard at all  Food Insecurity: No Food Insecurity (10/07/2023)   Hunger Vital Sign    Worried About Running Out of Food in the Last Year: Never true    Ran Out of Food in the Last Year: Never true  Transportation Needs: No Transportation Needs (10/07/2023)   PRAPARE - Scientist, research (physical sciences) (Medical): No    Lack of Transportation (Non-Medical): No  Physical Activity: Insufficiently Active (12/25/2022)   Exercise Vital Sign    Days of Exercise per Week: 2 days    Minutes of Exercise per Session: 30 min  Stress: No Stress Concern Present (12/25/2022)   [employer] of Occupational Health - Occupational Stress Questionnaire    Feeling of Stress : Not at all  Social Connections: Socially Integrated (09/14/2023)   Social Connection and Isolation Panel    Frequency of Communication with Friends and Family: More than three times a week    Frequency of Social Gatherings with Friends and Family: More than three times a week    Attends Religious Services: More than 4 times per year    Active Member of [employer] or Organizations: Yes    Attends Engineer, structural: More than 4 times per year    Marital Status: Married  Catering manager Violence: Not At Risk (10/07/2023)   Humiliation, Afraid, Rape, and Kick questionnaire    Fear of Current or Ex-Partner: No    Emotionally Abused: No    Physically Abused: No    Sexually Abused: No   Family History:  Family History  Problem Relation Age of Onset   Diabetes Father    Heart disease Father    Angina Father    Dementia Mother     Review of Systems: Constitutional: Doesn't report fevers, chills or abnormal weight loss Eyes: Doesn't report blurriness of vision Ears, nose, mouth, throat, and face: Doesn't report sore throat Respiratory: Doesn't report cough, dyspnea or wheezes Cardiovascular: Doesn't report palpitation, chest discomfort  Gastrointestinal:  Doesn't report nausea, constipation, diarrhea GU: Doesn't report incontinence Skin: Doesn't report skin rashes Neurological: Per HPI Musculoskeletal: Doesn't report joint pain Behavioral/Psych: Doesn't report anxiety  Physical Exam: Vitals:   11/23/23 1100  BP: 130/61  Pulse: 73  Resp: 20  Temp: (!) 97.5 F (36.4 C)  SpO2: 97%   KPS:  90. General: Alert, cooperative, pleasant, in no acute distress Head: Normal EENT: No conjunctival injection or scleral icterus.  Lungs: Resp effort normal Cardiac: Regular rate Abdomen: Non-distended abdomen Skin: No rashes cyanosis or petechiae.  Extremities: No clubbing or edema  Neurologic Exam: Mental Status: Awake, alert, attentive to examiner. Oriented to self and environment. Language is fluent with intact comprehension.  Cranial Nerves: Visual acuity is grossly normal. Visual fields are full. Extra-ocular movements intact. No ptosis. Face is symmetric Motor: Tone and bulk are normal. Power is full in both arms and legs. Reflexes are symmetric, no pathologic reflexes present.  Sensory: Intact to light touch Gait: Normal.   Labs: I have reviewed the data as listed    Component Value Date/Time   NA 141 11/09/2023 1042   NA 137 07/16/2017 0000   K 4.8 11/09/2023 1042   CL 105 11/09/2023 1042   CO2 32 11/09/2023 1042   GLUCOSE 132 (H) 11/09/2023 1042   BUN 13 11/09/2023 1042   BUN 16 07/16/2017 0000   CREATININE 0.84 11/09/2023 1042   CREATININE 0.72 02/24/2016 0806   CALCIUM 9.3 11/09/2023 1042   PROT 6.5 11/09/2023 1042   PROT 6.8 08/19/2023 0940   ALBUMIN 4.0 11/09/2023 1042   AST 17 11/09/2023 1042   ALT 21 11/09/2023 1042   ALKPHOS 64 11/09/2023 1042   BILITOT 0.8 11/09/2023 1042   GFRNONAA >60 11/09/2023 1042   GFRAA >90 07/29/2011 1120   Lab Results  Component Value Date   WBC 5.3 11/23/2023   NEUTROABS 2.9 11/23/2023   HGB 13.3 11/23/2023   HCT 38.9 (L) 11/23/2023   MCV 82.1 11/23/2023   PLT 203 11/23/2023      Assessment/Plan Glioblastoma, IDH-wildtype (HCC)  Joel Williams is clinically stable today, now having completed 4 weeks of IMRT and concurrent Temodar .  Labs remain within normal limits.  We ultimately recommended continuing with course of intensity modulated radiation therapy and concurrent daily Temozolomide .  Radiation will be  administered Mon-Fri over 6 weeks, Temodar  will be dosed at 75mg /m2 to be given daily over 42 days.  We reviewed side effects of temodar , including fatigue, nausea/vomiting, constipation, and cytopenias.  Informed consent was verbally obtained at bedside to proceed with oral chemotherapy.  Chemotherapy should be held for the following:  ANC less than 1,000  Platelets less than 100,000  LFT or creatinine greater than 2x ULN  If clinical concerns/contraindications develop  Every 2 weeks during radiation, labs will be checked accompanied by a clinical evaluation in the brain tumor clinic.  All questions were answered. The patient knows to call the clinic with any problems, questions or concerns. No barriers to learning were detected.  The total time spent in the encounter was 30 minutes and more than 50% was on counseling and review of test results   Arthea MARLA Manns, MD Medical Director of Neuro-Oncology [facility] 11/23/23 11:23 AM

## 2023-11-23 NOTE — Assessment & Plan Note (Signed)
 Blood pressure well-controlled today.  No issues with orthostatic hypotension.  Still eating and drinking well despite starting chemotherapy.  Will plan to continue amlodipine  and irbesartan .  I do detect 1+ pitting edema in his lower extremities today, probably from the amlodipine .  I recommended starting compression stockings during the day, and increase ambulation.

## 2023-11-23 NOTE — Assessment & Plan Note (Signed)
 New diagnosis of conjunctivitis mostly on the left side.  Little hard to tell if this is allergic or bacterial.  Given the crusting and thick discharge in the morning, will treat with Polytrim  for 1 week.  He has a chronic issue with dry eyes and recommended using artificial tears over-the-counter for that.  For allergic rhinitis he is using the loratadine  on a daily basis which is helpful.

## 2023-11-24 ENCOUNTER — Other Ambulatory Visit: Payer: Self-pay

## 2023-11-24 ENCOUNTER — Encounter

## 2023-11-24 ENCOUNTER — Ambulatory Visit
Admission: RE | Admit: 2023-11-24 | Discharge: 2023-11-24 | Disposition: A | Discharge status: Home / Self Care | Source: Ambulatory Visit | Attending: Radiation Oncology

## 2023-11-24 ENCOUNTER — Encounter: Admitting: Occupational Therapy

## 2023-11-24 ENCOUNTER — Ambulatory Visit: Admitting: Physical Therapy

## 2023-11-24 DIAGNOSIS — C712 Malignant neoplasm of temporal lobe: Secondary | ICD-10-CM | POA: Diagnosis not present

## 2023-11-24 DIAGNOSIS — Z510 Encounter for antineoplastic radiation therapy: Secondary | ICD-10-CM | POA: Diagnosis not present

## 2023-11-24 LAB — RAD ONC ARIA SESSION SUMMARY
Course Elapsed Days: 29
Plan Fractions Treated to Date: 21
Plan Prescribed Dose Per Fraction: 2 Gy
Plan Total Fractions Prescribed: 23
Plan Total Prescribed Dose: 46 Gy
Reference Point Dosage Given to Date: 42 Gy
Reference Point Session Dosage Given: 2 Gy
Session Number: 21

## 2023-11-25 ENCOUNTER — Inpatient Hospital Stay: Admitting: Internal Medicine

## 2023-11-25 ENCOUNTER — Inpatient Hospital Stay

## 2023-11-25 ENCOUNTER — Ambulatory Visit
Admission: RE | Admit: 2023-11-25 | Discharge: 2023-11-25 | Disposition: A | Discharge status: Home / Self Care | Source: Ambulatory Visit | Attending: Radiation Oncology

## 2023-11-25 ENCOUNTER — Other Ambulatory Visit: Payer: Self-pay

## 2023-11-25 DIAGNOSIS — C712 Malignant neoplasm of temporal lobe: Secondary | ICD-10-CM | POA: Diagnosis not present

## 2023-11-25 DIAGNOSIS — Z510 Encounter for antineoplastic radiation therapy: Secondary | ICD-10-CM | POA: Diagnosis not present

## 2023-11-25 LAB — RAD ONC ARIA SESSION SUMMARY
Course Elapsed Days: 30
Plan Fractions Treated to Date: 22
Plan Prescribed Dose Per Fraction: 2 Gy
Plan Total Fractions Prescribed: 23
Plan Total Prescribed Dose: 46 Gy
Reference Point Dosage Given to Date: 44 Gy
Reference Point Session Dosage Given: 2 Gy
Session Number: 22

## 2023-11-26 ENCOUNTER — Other Ambulatory Visit: Payer: Self-pay

## 2023-11-26 ENCOUNTER — Ambulatory Visit
Admission: RE | Admit: 2023-11-26 | Discharge: 2023-11-26 | Disposition: A | Discharge status: Home / Self Care | Source: Ambulatory Visit | Attending: Radiation Oncology | Admitting: Radiation Oncology

## 2023-11-26 DIAGNOSIS — Z510 Encounter for antineoplastic radiation therapy: Secondary | ICD-10-CM | POA: Diagnosis not present

## 2023-11-26 DIAGNOSIS — C712 Malignant neoplasm of temporal lobe: Secondary | ICD-10-CM | POA: Diagnosis not present

## 2023-11-26 LAB — RAD ONC ARIA SESSION SUMMARY
Course Elapsed Days: 31
Plan Fractions Treated to Date: 23
Plan Prescribed Dose Per Fraction: 2 Gy
Plan Total Fractions Prescribed: 23
Plan Total Prescribed Dose: 46 Gy
Reference Point Dosage Given to Date: 46 Gy
Reference Point Session Dosage Given: 2 Gy
Session Number: 23

## 2023-11-29 ENCOUNTER — Ambulatory Visit
Admission: RE | Admit: 2023-11-29 | Discharge: 2023-11-29 | Disposition: A | Discharge status: Home / Self Care | Source: Ambulatory Visit | Attending: Radiation Oncology | Admitting: Radiation Oncology

## 2023-11-29 ENCOUNTER — Ambulatory Visit (INDEPENDENT_AMBULATORY_CARE_PROVIDER_SITE_OTHER): Admitting: Otolaryngology

## 2023-11-29 ENCOUNTER — Ambulatory Visit
Admission: RE | Admit: 2023-11-29 | Discharge: 2023-11-29 | Disposition: A | Discharge status: Home / Self Care | Source: Ambulatory Visit | Attending: Radiation Oncology

## 2023-11-29 ENCOUNTER — Encounter

## 2023-11-29 ENCOUNTER — Ambulatory Visit: Admitting: Physical Therapy

## 2023-11-29 ENCOUNTER — Other Ambulatory Visit: Payer: Self-pay

## 2023-11-29 ENCOUNTER — Telehealth (INDEPENDENT_AMBULATORY_CARE_PROVIDER_SITE_OTHER): Payer: Self-pay

## 2023-11-29 ENCOUNTER — Encounter: Admitting: Occupational Therapy

## 2023-11-29 ENCOUNTER — Encounter (INDEPENDENT_AMBULATORY_CARE_PROVIDER_SITE_OTHER): Payer: Self-pay | Admitting: Otolaryngology

## 2023-11-29 VITALS — BP 120/72 | HR 70

## 2023-11-29 DIAGNOSIS — H9190 Unspecified hearing loss, unspecified ear: Secondary | ICD-10-CM

## 2023-11-29 DIAGNOSIS — H66011 Acute suppurative otitis media with spontaneous rupture of ear drum, right ear: Secondary | ICD-10-CM

## 2023-11-29 DIAGNOSIS — H6691 Otitis media, unspecified, right ear: Secondary | ICD-10-CM

## 2023-11-29 DIAGNOSIS — R42 Dizziness and giddiness: Secondary | ICD-10-CM | POA: Diagnosis not present

## 2023-11-29 DIAGNOSIS — C712 Malignant neoplasm of temporal lobe: Secondary | ICD-10-CM | POA: Diagnosis not present

## 2023-11-29 DIAGNOSIS — H903 Sensorineural hearing loss, bilateral: Secondary | ICD-10-CM

## 2023-11-29 DIAGNOSIS — Z510 Encounter for antineoplastic radiation therapy: Secondary | ICD-10-CM | POA: Diagnosis not present

## 2023-11-29 LAB — RAD ONC ARIA SESSION SUMMARY
Course Elapsed Days: 34
Plan Fractions Treated to Date: 1
Plan Prescribed Dose Per Fraction: 2 Gy
Plan Total Fractions Prescribed: 7
Plan Total Prescribed Dose: 14 Gy
Reference Point Dosage Given to Date: 2 Gy
Reference Point Session Dosage Given: 2 Gy
Session Number: 24

## 2023-11-29 MED ORDER — CIPROFLOXACIN-DEXAMETHASONE 0.3-0.1 % OT SUSP: 4.0000 [drp] | Freq: Two times a day (BID) | OTIC | 8 refills | Status: AC

## 2023-11-29 NOTE — Telephone Encounter (Signed)
 Pt wife called and left a message stating they had just left the cancer center and mr. Mahlik's right ear was really red and he is in a lot of pain. Pt wife is requesting to schedule an appointment with Karis as soon as possible.

## 2023-11-30 ENCOUNTER — Ambulatory Visit
Admission: RE | Admit: 2023-11-30 | Discharge: 2023-11-30 | Disposition: A | Discharge status: Home / Self Care | Source: Ambulatory Visit | Attending: Radiation Oncology

## 2023-11-30 ENCOUNTER — Other Ambulatory Visit: Payer: Self-pay

## 2023-11-30 DIAGNOSIS — Z510 Encounter for antineoplastic radiation therapy: Secondary | ICD-10-CM | POA: Diagnosis not present

## 2023-11-30 DIAGNOSIS — C712 Malignant neoplasm of temporal lobe: Secondary | ICD-10-CM | POA: Diagnosis not present

## 2023-11-30 DIAGNOSIS — H66011 Acute suppurative otitis media with spontaneous rupture of ear drum, right ear: Secondary | ICD-10-CM | POA: Insufficient documentation

## 2023-11-30 LAB — RAD ONC ARIA SESSION SUMMARY
Course Elapsed Days: 35
Plan Fractions Treated to Date: 2
Plan Prescribed Dose Per Fraction: 2 Gy
Plan Total Fractions Prescribed: 7
Plan Total Prescribed Dose: 14 Gy
Reference Point Dosage Given to Date: 4 Gy
Reference Point Session Dosage Given: 2 Gy
Session Number: 25

## 2023-11-30 NOTE — Progress Notes (Signed)
 Patient ID: Joel Williams, male   DOB: 02-11-1951, 73 y.o.   MRN: [id number]  New complaint: Right ear pain, decreased hearing Follow-up: Recurrent dizziness  HPI: The patient is a 73 year old male who presents today with a new complaint of acute onset right ear pain and hearing difficulty over the past week.  The patient was last seen in August 2025.  At that time, he was complaining of recurrent dizziness and hearing loss. The patient has a history of stage IV right temporal lobe glioblastoma. He was treated with craniotomy and resection of the tumor in July 2025. He is currently receiving chemoradiation treatment.  According to the patient, he started experiencing increasing right ear pain over the past week.  His right ear is tender to touch.  He has noted right ear drainage.  His right ear hearing has progressively worsened.  This is his last week of radiation treatment.  Exam: General: Communicates without difficulty, well nourished, no acute distress. Head: Normocephalic, no evidence injury, no tenderness, facial buttresses intact without stepoff. Face/sinus: No tenderness to palpation and percussion. Facial movement is normal and symmetric. Eyes: PERRL, EOMI. No scleral icterus, conjunctivae clear. Neuro: CN II exam reveals vision grossly intact.  No nystagmus at any point of gaze. Ears: Auricles well formed without lesions.  Purulent drainage is noted from the right ear canal.  The left ear canal and tympanic membrane are normal.  Under the operating microscope, the right ear canal is debrided with a suction catheter.  The right tympanic membrane is edematous and erythematous.  Nose: External evaluation reveals normal support and skin without lesions.  Dorsum is intact.  Anterior rhinoscopy reveals congested mucosa over anterior aspect of inferior turbinates and intact septum.  No purulence noted. Oral:  Oral cavity and oropharynx are intact, symmetric, without erythema or edema.  Mucosa is  moist without lesions. Neck: Full range of motion without pain.  There is no significant lymphadenopathy.  No masses palpable.  Thyroid  bed within normal limits to palpation.  Parotid glands and submandibular glands equal bilaterally without mass.  Trachea is midline. Neuro:  CN 2-12 grossly intact.   Assessment: 1.  Acute right otitis media with otorrhea. 2.  Recurrent dizziness, likely secondary to treatment for his right temporal lobe glioblastoma.  He is currently receiving chemoradiation treatment. 3.  Progressive hearing loss.  Plan: 1.  Otomicroscopy with debridement of the right ear canal. 2.  Ciprodex  eardrops 4 drops right ear twice daily for 10 days. 3.  Dry ear precautions on the right side. 4.  The patient will return for reevaluation in November, sooner if needed.

## 2023-12-01 ENCOUNTER — Ambulatory Visit
Admission: RE | Admit: 2023-12-01 | Discharge: 2023-12-01 | Disposition: A | Discharge status: Home / Self Care | Source: Ambulatory Visit | Attending: Radiation Oncology

## 2023-12-01 ENCOUNTER — Ambulatory Visit: Admitting: Physical Therapy

## 2023-12-01 ENCOUNTER — Other Ambulatory Visit: Payer: Self-pay

## 2023-12-01 ENCOUNTER — Encounter: Admitting: Occupational Therapy

## 2023-12-01 ENCOUNTER — Encounter

## 2023-12-01 DIAGNOSIS — C712 Malignant neoplasm of temporal lobe: Secondary | ICD-10-CM | POA: Diagnosis not present

## 2023-12-01 DIAGNOSIS — Z510 Encounter for antineoplastic radiation therapy: Secondary | ICD-10-CM | POA: Diagnosis not present

## 2023-12-01 LAB — RAD ONC ARIA SESSION SUMMARY
Course Elapsed Days: 36
Plan Fractions Treated to Date: 3
Plan Prescribed Dose Per Fraction: 2 Gy
Plan Total Fractions Prescribed: 7
Plan Total Prescribed Dose: 14 Gy
Reference Point Dosage Given to Date: 6 Gy
Reference Point Session Dosage Given: 2 Gy
Session Number: 26

## 2023-12-02 ENCOUNTER — Other Ambulatory Visit: Payer: Self-pay

## 2023-12-02 ENCOUNTER — Ambulatory Visit
Admission: RE | Admit: 2023-12-02 | Discharge: 2023-12-02 | Disposition: A | Discharge status: Home / Self Care | Source: Ambulatory Visit | Attending: Radiation Oncology

## 2023-12-02 ENCOUNTER — Inpatient Hospital Stay: Admitting: Internal Medicine

## 2023-12-02 ENCOUNTER — Inpatient Hospital Stay

## 2023-12-02 DIAGNOSIS — Z510 Encounter for antineoplastic radiation therapy: Secondary | ICD-10-CM | POA: Diagnosis not present

## 2023-12-02 DIAGNOSIS — C712 Malignant neoplasm of temporal lobe: Secondary | ICD-10-CM | POA: Diagnosis not present

## 2023-12-02 LAB — RAD ONC ARIA SESSION SUMMARY
Course Elapsed Days: 37
Plan Fractions Treated to Date: 4
Plan Prescribed Dose Per Fraction: 2 Gy
Plan Total Fractions Prescribed: 7
Plan Total Prescribed Dose: 14 Gy
Reference Point Dosage Given to Date: 8 Gy
Reference Point Session Dosage Given: 2 Gy
Session Number: 27

## 2023-12-03 ENCOUNTER — Ambulatory Visit
Admission: RE | Admit: 2023-12-03 | Discharge: 2023-12-03 | Disposition: A | Discharge status: Home / Self Care | Source: Ambulatory Visit | Attending: Radiation Oncology | Admitting: Radiation Oncology

## 2023-12-03 ENCOUNTER — Other Ambulatory Visit: Payer: Self-pay

## 2023-12-03 DIAGNOSIS — Z510 Encounter for antineoplastic radiation therapy: Secondary | ICD-10-CM | POA: Diagnosis not present

## 2023-12-03 DIAGNOSIS — C712 Malignant neoplasm of temporal lobe: Secondary | ICD-10-CM | POA: Diagnosis not present

## 2023-12-03 LAB — RAD ONC ARIA SESSION SUMMARY
Course Elapsed Days: 38
Plan Fractions Treated to Date: 5
Plan Prescribed Dose Per Fraction: 2 Gy
Plan Total Fractions Prescribed: 7
Plan Total Prescribed Dose: 14 Gy
Reference Point Dosage Given to Date: 10 Gy
Reference Point Session Dosage Given: 2 Gy
Session Number: 28

## 2023-12-06 ENCOUNTER — Ambulatory Visit: Admitting: Physical Therapy

## 2023-12-06 ENCOUNTER — Encounter

## 2023-12-06 ENCOUNTER — Other Ambulatory Visit: Payer: Self-pay

## 2023-12-06 ENCOUNTER — Ambulatory Visit
Admission: RE | Admit: 2023-12-06 | Discharge: 2023-12-06 | Disposition: A | Discharge status: Home / Self Care | Source: Ambulatory Visit | Attending: Radiation Oncology

## 2023-12-06 ENCOUNTER — Ambulatory Visit
Admission: RE | Admit: 2023-12-06 | Discharge: 2023-12-06 | Disposition: A | Discharge status: Home / Self Care | Source: Ambulatory Visit | Attending: Radiation Oncology | Admitting: Radiation Oncology

## 2023-12-06 ENCOUNTER — Encounter: Admitting: Occupational Therapy

## 2023-12-06 DIAGNOSIS — Z510 Encounter for antineoplastic radiation therapy: Secondary | ICD-10-CM | POA: Diagnosis not present

## 2023-12-06 DIAGNOSIS — C712 Malignant neoplasm of temporal lobe: Secondary | ICD-10-CM | POA: Diagnosis not present

## 2023-12-06 LAB — RAD ONC ARIA SESSION SUMMARY
Course Elapsed Days: 41
Plan Fractions Treated to Date: 6
Plan Prescribed Dose Per Fraction: 2 Gy
Plan Total Fractions Prescribed: 7
Plan Total Prescribed Dose: 14 Gy
Reference Point Dosage Given to Date: 12 Gy
Reference Point Session Dosage Given: 2 Gy
Session Number: 29

## 2023-12-07 ENCOUNTER — Other Ambulatory Visit: Payer: Self-pay

## 2023-12-07 ENCOUNTER — Inpatient Hospital Stay

## 2023-12-07 ENCOUNTER — Telehealth: Payer: Self-pay | Admitting: Internal Medicine

## 2023-12-07 ENCOUNTER — Inpatient Hospital Stay (HOSPITAL_BASED_OUTPATIENT_CLINIC_OR_DEPARTMENT_OTHER): Admitting: Internal Medicine

## 2023-12-07 ENCOUNTER — Ambulatory Visit
Admission: RE | Admit: 2023-12-07 | Discharge: 2023-12-07 | Disposition: A | Discharge status: Home / Self Care | Source: Ambulatory Visit | Attending: Radiation Oncology | Admitting: Radiation Oncology

## 2023-12-07 VITALS — BP 128/66 | HR 68 | Temp 97.2°F | Resp 18 | Wt 222.1 lb

## 2023-12-07 DIAGNOSIS — C712 Malignant neoplasm of temporal lobe: Secondary | ICD-10-CM | POA: Diagnosis not present

## 2023-12-07 DIAGNOSIS — C719 Malignant neoplasm of brain, unspecified: Secondary | ICD-10-CM

## 2023-12-07 DIAGNOSIS — Z87891 Personal history of nicotine dependence: Secondary | ICD-10-CM | POA: Diagnosis not present

## 2023-12-07 DIAGNOSIS — Z510 Encounter for antineoplastic radiation therapy: Secondary | ICD-10-CM | POA: Diagnosis not present

## 2023-12-07 LAB — CMP (CANCER CENTER ONLY)
ALT: 21 U/L (ref 0–44)
AST: 21 U/L (ref 15–41)
Albumin: 4.1 g/dL (ref 3.5–5.0)
Alkaline Phosphatase: 60 U/L (ref 38–126)
Anion gap: 7 (ref 5–15)
BUN: 13 mg/dL (ref 8–23)
CO2: 30 mmol/L (ref 22–32)
Calcium: 9.6 mg/dL (ref 8.9–10.3)
Chloride: 105 mmol/L (ref 98–111)
Creatinine: 0.83 mg/dL (ref 0.61–1.24)
GFR, Estimated: >60 mL/min (ref >60)
Glucose, Bld: 92 mg/dL (ref 70–99)
Potassium: 4.8 mmol/L (ref 3.5–5.1)
Sodium: 142 mmol/L (ref 135–145)
Total Bilirubin: 0.7 mg/dL (ref 0.0–1.2)
Total Protein: 7 g/dL (ref 6.5–8.1)

## 2023-12-07 LAB — CBC WITH DIFFERENTIAL (CANCER CENTER ONLY)
Abs Immature Granulocytes: 0.01 K/uL (ref 0.00–0.07)
Basophils Absolute: 0.1 K/uL (ref 0.0–0.1)
Basophils Relative: 2 %
Eosinophils Absolute: 0.5 K/uL (ref 0.0–0.5)
Eosinophils Relative: 9 %
HCT: 40.2 % (ref 39.0–52.0)
Hemoglobin: 13.7 g/dL (ref 13.0–17.0)
Immature Granulocytes: 0 %
Lymphocytes Relative: 18 %
Lymphs Abs: 1 K/uL (ref 0.7–4.0)
MCH: 28.3 pg (ref 26.0–34.0)
MCHC: 34.1 g/dL (ref 30.0–36.0)
MCV: 83.1 fL (ref 80.0–100.0)
Monocytes Absolute: 0.7 K/uL (ref 0.1–1.0)
Monocytes Relative: 13 %
Neutro Abs: 3.2 K/uL (ref 1.7–7.7)
Neutrophils Relative %: 58 %
Platelet Count: 203 K/uL (ref 150–400)
RBC: 4.84 MIL/uL (ref 4.22–5.81)
RDW: 14.4 % (ref 11.5–15.5)
WBC Count: 5.5 K/uL (ref 4.0–10.5)
nRBC: 0 % (ref 0.0–0.2)

## 2023-12-07 LAB — RAD ONC ARIA SESSION SUMMARY
Course Elapsed Days: 42
Plan Fractions Treated to Date: 7
Plan Prescribed Dose Per Fraction: 2 Gy
Plan Total Fractions Prescribed: 7
Plan Total Prescribed Dose: 14 Gy
Reference Point Dosage Given to Date: 14 Gy
Reference Point Session Dosage Given: 2 Gy
Session Number: 30

## 2023-12-07 MED ORDER — LORAZEPAM 1 MG PO TABS
1.0000 mg | ORAL_TABLET | Freq: Once | ORAL | 0 refills | Status: DC | PRN
Start: 2023-12-07 — End: 2024-02-07

## 2023-12-07 NOTE — Telephone Encounter (Signed)
 Scheduled appointments per 9/23 los. Talked with the patients spouse and she is aware of the made appointments for the patient.

## 2023-12-07 NOTE — Progress Notes (Signed)
 [facility] at [facility] [address]  [place], [place] [address] [phone]   Interval Evaluation  Date of Service: 12/07/23 Patient Name: Joel Williams Patient MRN: [id number] Patient DOB: [birth year] Provider: Arthea MARLA Manns, MD  Identifying Statement:  Joel Williams is a 73 y.o. male with right temporal glioblastoma    Oncologic History: Oncology History  Glioblastoma, IDH-wildtype (HCC)  09/17/2023 Initial Diagnosis   Glioblastoma, IDH-wildtype (HCC)   10/14/2023 -  Chemotherapy   Patient is on Treatment Plan : BRAIN GLIOBLASTOMA Radiation Therapy With Concurrent Temozolomide  75 mg/m2 Daily Followed By Sequential Maintenance Temozolomide  x 6-12 cycles       Biomarkers:  MGMT Unknown.  IDH 1/2 Unknown.  EGFR Unknown  TERT Unknown   Interval History: Joel Williams presents today for follow up, now in final week of radiation and Temodar .  He continues to do well with treatment so far, no complications.  Headaches have not recurred.  He denies seizures, gait difficulty.  Fatigue is moderate overall.  H+P (10/07/23) Patient presented to neurologic attention last month with several weeks of confusion, fatigue, lethargy, visual complaints.  MRI brain was obtained which demonstrated a large enhancing mass in the right temporal lobe, consistent with primary brain tumor.  He underwent craniotomy, resection with Dr. Lanis on 09/16/23; path demonstrated glioblastoma.  Since surgery he has been doing well, walks on his own, independent with activities of daily living.  He has been having daily headaches, dosing Tylenol , Fioricet  and Topamax  25mg  twice per day for prevention.  Medications: Current Outpatient Medications on File Prior to Visit  Medication Sig Dispense Refill   albuterol  (VENTOLIN  HFA) 108 (90 Base) MCG/ACT inhaler Inhale 1-2 puffs into the lungs every 6 (six) hours as needed for wheezing or shortness of breath. 6.7 g 0    amLODipine  (NORVASC ) 10 MG tablet Take 1 tablet (10 mg total) by mouth at bedtime. 30 tablet 0   aspirin  EC 81 MG tablet Take 1 tablet (81 mg total) by mouth daily. Swallow whole.     ciprofloxacin -dexamethasone  (CIPRODEX ) OTIC suspension Place 4 drops into the right ear 2 (two) times daily for 10 days. 7.5 mL 8   Continuous Glucose Sensor (FREESTYLE LIBRE 3 PLUS SENSOR) MISC apply to upper back of arm for 90 days change sensor every 15 days IC-10 CODE: E11.9     insulin  aspart (NOVOLOG ) 100 UNIT/ML injection Inject 0-20 Units into the skin 4 (four) times daily -  before meals and at bedtime. CBG 70 - 120: 0 units CBG 121 - 150: 3 units CBG 151 - 200: 4 units CBG 201 - 250: 7 units CBG 251 - 300: 11 units CBG 301 - 350: 15 units CBG 351 - 400: 20 units CBG > 400: call MD and obtain STAT lab verification     insulin  aspart (NOVOLOG ) 100 UNIT/ML injection Inject 5 Units into the skin with breakfast, with lunch, and with evening meal.     irbesartan  (AVAPRO ) 150 MG tablet Take 1 tablet (150 mg total) by mouth daily. 30 tablet 0   loratadine  (CLARITIN ) 10 MG tablet Take 10 mg by mouth daily.     metFORMIN  (GLUCOPHAGE -XR) 500 MG 24 hr tablet Take 2 tablets (1,000 mg total) by mouth 2 (two) times daily. 120 tablet 0   metoprolol  succinate (TOPROL -XL) 50 MG 24 hr tablet Take 1 tablet (50 mg total) by mouth daily. Take with or immediately following a meal. 30 tablet 0  ondansetron  (ZOFRAN ) 8 MG tablet Take 1 tablet (8 mg total) by mouth every 8 (eight) hours as needed for nausea or vomiting. May take 30-60 minutes prior to Temodar  administration if nausea/vomiting occurs as needed. 30 tablet 1   pantoprazole  (PROTONIX ) 40 MG tablet Take 1 tablet (40 mg total) by mouth at bedtime. 90 tablet 3   simvastatin  (ZOCOR ) 20 MG tablet Take 1 tablet (20 mg total) by mouth daily. 30 tablet 0   temozolomide  (TEMODAR ) 140 MG capsule Take 1 capsule (140 mg total) by mouth daily. (Take with ONE 20 mg capsule for total daily  dose of 160 mg). May take on an empty stomach to decrease nausea & vomiting. 42 capsule 0   temozolomide  (TEMODAR ) 20 MG capsule Take 1 capsule (20 mg total) by mouth daily. (Take with ONE 140 mg capsule for total daily dose of 160 mg). May take on an empty stomach to decrease nausea & vomiting. 42 capsule 0   No current facility-administered medications on file prior to visit.    Allergies:  Allergies  Allergen Reactions   Invokana [Canagliflozin]     Yeast infections   Trulicity [Dulaglutide] Other (See Comments)    yeast infections   Past Medical History:  Past Medical History:  Diagnosis Date   Allergic rhinitis    Arthritis    ASTHMA 06/08/2008   Asthma    as a child   CAD (coronary artery disease)    a. s/p Promus DES (3.0 x 16 mm) to mid LAD 05/16/10;  b. Myoview  05/15/10: anteroseptal ischemia with EF 52%;   c. Cath 05/16/10: single vessel CAD with mLAD 90% tx with PCI and EF 50%;  d. 07/2011 Cath:  Patent stent, nonobs dzs, NL EF.   Chronic kidney disease    was seen by Dr. Lonna and released.   Colonic polyp    DIABETES MELLITUS, TYPE II 06/08/2008   GERD (gastroesophageal reflux disease)    Hemorrhoids    HYPERLIPIDEMIA 06/08/2008   HYPERTENSION 06/08/2008   Impotence of organic origin 08/24/2008   Myocardial infarction (HCC) 04/16/2010   Pneumonia    hx of   Renal vein thrombosis (HCC)    Seasonal allergies    Status post craniotomy 09/14/2023   Past Surgical History:  Past Surgical History:  Procedure Laterality Date   APPLICATION OF CRANIAL NAVIGATION Right 09/14/2023   Procedure: COMPUTER-ASSISTED NAVIGATION, FOR CRANIAL PROCEDURE;  Surgeon: Lanis Pupa, MD;  Location: [facility] OR;  Service: Neurosurgery;  Laterality: Right;   BACK SURGERY  2011   CATARACT EXTRACTION Bilateral    2024   CORONARY ANGIOPLASTY WITH STENT PLACEMENT     CRANIOTOMY Right 09/14/2023   Procedure: STEREOTACTIC RIGHT TEMPRO-OCCIPITAL CRANIOTOMY FOR RESECTION OF TUMOR;  Surgeon:  Lanis Pupa, MD;  Location: [facility] OR;  Service: Neurosurgery;  Laterality: Right;   LEFT HEART CATHETERIZATION WITH CORONARY ANGIOGRAM N/A 07/29/2011   Procedure: LEFT HEART CATHETERIZATION WITH CORONARY ANGIOGRAM;  Surgeon: Lonni JONETTA Cash, MD;  Location: [facility] CATH LAB;  Service: Cardiovascular;  Laterality: N/A;   LEFT HEART CATHETERIZATION WITH CORONARY ANGIOGRAM N/A 06/14/2013   Procedure: LEFT HEART CATHETERIZATION WITH CORONARY ANGIOGRAM;  Surgeon: Lonni JONETTA Cash, MD;  Location: [facility] CATH LAB;  Service: Cardiovascular;  Laterality: N/A;   RADIOLOGY WITH ANESTHESIA N/A 06/09/2022   Procedure: MRI WITH ANESTHESIA OF LUMBAR SPINE WITHOUT CONTRAST;  Surgeon: Radiologist, Medication, MD;  Location: [facility] OR;  Service: Radiology;  Laterality: N/A;   Social History:  Social History   Socioeconomic History  Marital status: Married    Spouse name: Not on file   Number of children: 2   Years of education: Not on file   Highest education level: Not on file  Occupational History   Occupation: Sales  Tobacco Use   Smoking status: Former    Current packs/day: 0.00    Average packs/day: 2.0 packs/day for 53.0 years (106.0 ttl pk-yrs)    Types: Cigarettes    Start date: 05/22/1949    Quit date: 05/23/2002    Years since quitting: 21.5   Smokeless tobacco: Never  Vaping Use   Vaping status: Never Used  Substance and Sexual Activity   Alcohol use: Not Currently    Comment: Ocassional since starting Ozempic - fall 2023.   Drug use: No   Sexual activity: Not Currently  Other Topics Concern   Not on file  Social History Narrative   Not on file   Social Drivers of Health   Financial Resource Strain: Low Risk  (12/25/2022)   Overall Financial Resource Strain (CARDIA)    Difficulty of Paying Living Expenses: Not hard at all  Food Insecurity: No Food Insecurity (10/07/2023)   Hunger Vital Sign    Worried About Running Out of Food in the Last Year: Never true    Ran Out of Food in  the Last Year: Never true  Transportation Needs: No Transportation Needs (10/07/2023)   PRAPARE - Administrator, Civil Service (Medical): No    Lack of Transportation (Non-Medical): No  Physical Activity: Insufficiently Active (12/25/2022)   Exercise Vital Sign    Days of Exercise per Week: 2 days    Minutes of Exercise per Session: 30 min  Stress: No Stress Concern Present (12/25/2022)   [employer] of Occupational Health - Occupational Stress Questionnaire    Feeling of Stress : Not at all  Social Connections: Socially Integrated (09/14/2023)   Social Connection and Isolation Panel    Frequency of Communication with Friends and Family: More than three times a week    Frequency of Social Gatherings with Friends and Family: More than three times a week    Attends Religious Services: More than 4 times per year    Active Member of [employer] or Organizations: Yes    Attends Engineer, structural: More than 4 times per year    Marital Status: Married  Catering manager Violence: Not At Risk (10/07/2023)   Humiliation, Afraid, Rape, and Kick questionnaire    Fear of Current or Ex-Partner: No    Emotionally Abused: No    Physically Abused: No    Sexually Abused: No   Family History:  Family History  Problem Relation Age of Onset   Diabetes Father    Heart disease Father    Angina Father    Dementia Mother     Review of Systems: Constitutional: Doesn't report fevers, chills or abnormal weight loss Eyes: Doesn't report blurriness of vision Ears, nose, mouth, throat, and face: Doesn't report sore throat Respiratory: Doesn't report cough, dyspnea or wheezes Cardiovascular: Doesn't report palpitation, chest discomfort  Gastrointestinal:  Doesn't report nausea, constipation, diarrhea GU: Doesn't report incontinence Skin: Doesn't report skin rashes Neurological: Per HPI Musculoskeletal: Doesn't report joint pain Behavioral/Psych: Doesn't report anxiety  Physical  Exam: There were no vitals filed for this visit.  KPS: 90. General: Alert, cooperative, pleasant, in no acute distress Head: Normal EENT: No conjunctival injection or scleral icterus.  Lungs: Resp effort normal Cardiac: Regular rate Abdomen: Non-distended abdomen Skin: No  rashes cyanosis or petechiae. Extremities: No clubbing or edema  Neurologic Exam: Mental Status: Awake, alert, attentive to examiner. Oriented to self and environment. Language is fluent with intact comprehension.  Cranial Nerves: Visual acuity is grossly normal. Visual fields are full. Extra-ocular movements intact. No ptosis. Face is symmetric Motor: Tone and bulk are normal. Power is full in both arms and legs. Reflexes are symmetric, no pathologic reflexes present.  Sensory: Intact to light touch Gait: Normal.   Labs: I have reviewed the data as listed    Component Value Date/Time   NA 142 11/23/2023 1043   NA 137 07/16/2017 0000   K 5.2 (H) 11/23/2023 1043   CL 107 11/23/2023 1043   CO2 29 11/23/2023 1043   GLUCOSE 143 (H) 11/23/2023 1043   BUN 18 11/23/2023 1043   BUN 16 07/16/2017 0000   CREATININE 0.93 11/23/2023 1043   CREATININE 0.72 02/24/2016 0806   CALCIUM 9.8 11/23/2023 1043   PROT 6.9 11/23/2023 1043   PROT 6.8 08/19/2023 0940   ALBUMIN 4.1 11/23/2023 1043   AST 17 11/23/2023 1043   ALT 21 11/23/2023 1043   ALKPHOS 64 11/23/2023 1043   BILITOT 0.6 11/23/2023 1043   GFRNONAA >60 11/23/2023 1043   GFRAA >90 07/29/2011 1120   Lab Results  Component Value Date   WBC 5.3 11/23/2023   NEUTROABS 2.9 11/23/2023   HGB 13.3 11/23/2023   HCT 38.9 (L) 11/23/2023   MCV 82.1 11/23/2023   PLT 203 11/23/2023      Assessment/Plan Glioblastoma, IDH-wildtype (HCC)  Joel Williams is clinically stable today, now having completed 6 weeks of IMRT and concurrent Temodar .  Labs remain within normal limits.  We ultimately recommended completin course of intensity modulated radiation therapy  and concurrent daily Temozolomide .  Radiation will be administered Mon-Fri over 6 weeks, Temodar  will be dosed at 75mg /m2 to be given daily over 42 days.  We reviewed side effects of temodar , including fatigue, nausea/vomiting, constipation, and cytopenias.  Informed consent was verbally obtained at bedside to proceed with oral chemotherapy.  Chemotherapy should be held for the following:  ANC less than 1,000  Platelets less than 100,000  LFT or creatinine greater than 2x ULN  If clinical concerns/contraindications develop  We ask that Joel Williams return to clinic in 1 months following next brain MRI, or sooner as needed.  All questions were answered. The patient knows to call the clinic with any problems, questions or concerns. No barriers to learning were detected.  The total time spent in the encounter was 30 minutes and more than 50% was on counseling and review of test results   Arthea MARLA Manns, MD Medical Director of Neuro-Oncology [facility] at [facility] 12/07/23 10:41 AM

## 2023-12-08 ENCOUNTER — Ambulatory Visit: Admitting: Physical Therapy

## 2023-12-08 ENCOUNTER — Encounter

## 2023-12-08 ENCOUNTER — Other Ambulatory Visit (HOSPITAL_COMMUNITY): Payer: Self-pay

## 2023-12-08 ENCOUNTER — Other Ambulatory Visit: Payer: Self-pay | Admitting: Internal Medicine

## 2023-12-08 ENCOUNTER — Encounter: Admitting: Occupational Therapy

## 2023-12-08 DIAGNOSIS — C719 Malignant neoplasm of brain, unspecified: Secondary | ICD-10-CM

## 2023-12-08 MED ORDER — TEMOZOLOMIDE 140 MG PO CAPS
140.0000 mg | ORAL_CAPSULE | Freq: Every day | ORAL | 0 refills | Status: DC
  Filled 2023-12-09: qty 42, 42d supply, fill #0

## 2023-12-08 MED ORDER — TEMOZOLOMIDE 20 MG PO CAPS
20.0000 mg | ORAL_CAPSULE | Freq: Every day | ORAL | 0 refills | Status: DC
  Filled 2023-12-09: qty 42, 42d supply, fill #0

## 2023-12-08 NOTE — Telephone Encounter (Signed)
 Per 9/23 office visit, continue Temodar . Andrea CHRISTELLA Plunk, RN

## 2023-12-08 NOTE — Radiation Completion Notes (Signed)
 Patient Name: Joel Williams, Joel Williams MRN: [id number] Date of Birth: 29-Apr-1950 Referring Physician: ARTHEA MANNS, M.D. Date of Service: 2023-12-08 Radiation Oncologist: Lauraine Golden, M.D. [facility]                             RADIATION ONCOLOGY END OF TREATMENT NOTE     Diagnosis: C71.2 Malignant neoplasm of temporal lobe Intent: Curative     ==========DELIVERED PLANS==========  First Treatment Date: 2023-10-26 Last Treatment Date: 2023-12-07   Plan Name: Brain_R_Temp Site: Temporal Lobe Technique: IMRT Mode: Photon Dose Per Fraction: 2 Gy Prescribed Dose (Delivered / Prescribed): 46 Gy / 46 Gy Prescribed Fxs (Delivered / Prescribed): 23 / 23   Plan Name: Brain_Rte_Bst Site: Temporal Lobe Technique: IMRT Mode: Photon Dose Per Fraction: 2 Gy Prescribed Dose (Delivered / Prescribed): 14 Gy / 14 Gy Prescribed Fxs (Delivered / Prescribed): 7 / 7     ==========ON TREATMENT VISIT DATES========== 2023-11-01, 2023-11-08, 2023-11-16, 2023-11-22, 2023-11-29, 2023-12-06     ==========UPCOMING VISITS========== 02/22/2024 [facility] OFFICE VISIT [name], MD  02/08/2024 [facility] FOLLOW UP Karis Clunes, MD  01/31/2024 [facility] ADULT HEARING EVAL Leroux-Martinez, Rosaline Jansky, AUD  01/18/2024 [facility] FOLLOW UP 20 Wyatt Leeroy HERO, [place]  01/04/2024 [facility] EST PT 30 MANNS ARTHEA POUR, MD  01/04/2024 [facility]  12/30/2023 [facility] MRI MR BRAIN WWO CONTRAST-GI [facility] 1        ==========APPENDIX - ON TREATMENT VISIT NOTES==========   See weekly On Treatment Notes in Epic for details in the Media tab (listed as Progress notes on the On Treatment Visit Dates listed above).

## 2023-12-09 ENCOUNTER — Other Ambulatory Visit (HOSPITAL_COMMUNITY): Payer: Self-pay

## 2023-12-09 ENCOUNTER — Other Ambulatory Visit: Payer: Self-pay

## 2023-12-10 ENCOUNTER — Telehealth: Payer: Self-pay | Admitting: Pharmacist

## 2023-12-10 ENCOUNTER — Other Ambulatory Visit: Payer: Self-pay

## 2023-12-10 NOTE — Progress Notes (Signed)
 6 weeks of Temodar  therapy complete.

## 2023-12-10 NOTE — Telephone Encounter (Signed)
 Oral Oncology Pharmacist Encounter  [facility] reached out regarding prescriptions for temozolomide  that were sent in on 12/08/23 for patient. These prescriptions were inappropriately refilled as patient completed radiation on 12/07/23 and has completed concurrent course of temozolomide /radiation at this time.   I have discontinued the TMZ prescriptions off of patients list.    [name], PharmD, BCPS, BCOP Hematology/Oncology Clinical Pharmacist [phone] 12/10/2023 2:08 PM

## 2023-12-11 ENCOUNTER — Other Ambulatory Visit: Payer: Self-pay | Admitting: Physician Assistant

## 2023-12-14 MED ORDER — METOPROLOL SUCCINATE ER 50 MG PO TB24: 50.0000 mg | ORAL_TABLET | Freq: Every day | ORAL | 2 refills | Status: DC

## 2023-12-17 ENCOUNTER — Other Ambulatory Visit

## 2023-12-21 DIAGNOSIS — H53453 Other localized visual field defect, bilateral: Secondary | ICD-10-CM | POA: Diagnosis not present

## 2023-12-21 DIAGNOSIS — H04123 Dry eye syndrome of bilateral lacrimal glands: Secondary | ICD-10-CM | POA: Diagnosis not present

## 2023-12-21 DIAGNOSIS — H524 Presbyopia: Secondary | ICD-10-CM | POA: Diagnosis not present

## 2023-12-21 DIAGNOSIS — E119 Type 2 diabetes mellitus without complications: Secondary | ICD-10-CM | POA: Diagnosis not present

## 2023-12-21 DIAGNOSIS — E113293 Type 2 diabetes mellitus with mild nonproliferative diabetic retinopathy without macular edema, bilateral: Secondary | ICD-10-CM | POA: Diagnosis not present

## 2023-12-21 DIAGNOSIS — Z9849 Cataract extraction status, unspecified eye: Secondary | ICD-10-CM | POA: Diagnosis not present

## 2023-12-21 DIAGNOSIS — H52223 Regular astigmatism, bilateral: Secondary | ICD-10-CM | POA: Diagnosis not present

## 2023-12-21 DIAGNOSIS — Z961 Presence of intraocular lens: Secondary | ICD-10-CM | POA: Diagnosis not present

## 2023-12-30 ENCOUNTER — Ambulatory Visit
Admission: RE | Admit: 2023-12-30 | Discharge: 2023-12-30 | Disposition: A | Discharge status: Home / Self Care | Source: Ambulatory Visit | Attending: Internal Medicine | Admitting: Internal Medicine

## 2023-12-30 DIAGNOSIS — C719 Malignant neoplasm of brain, unspecified: Secondary | ICD-10-CM | POA: Diagnosis not present

## 2023-12-30 MED ORDER — GADOPICLENOL 0.5 MMOL/ML IV SOLN
10.0000 mL | Freq: Once | INTRAVENOUS | Status: AC | PRN
Start: 2023-12-30 — End: 2023-12-30
  Administered 2023-12-30: 10 mL via INTRAVENOUS

## 2024-01-04 ENCOUNTER — Other Ambulatory Visit: Payer: Self-pay | Admitting: Pharmacist

## 2024-01-04 ENCOUNTER — Other Ambulatory Visit (HOSPITAL_COMMUNITY): Payer: Self-pay

## 2024-01-04 ENCOUNTER — Other Ambulatory Visit: Payer: Self-pay

## 2024-01-04 ENCOUNTER — Inpatient Hospital Stay: Attending: Radiation Oncology

## 2024-01-04 ENCOUNTER — Telehealth: Payer: Self-pay | Admitting: *Deleted

## 2024-01-04 ENCOUNTER — Telehealth: Payer: Self-pay

## 2024-01-04 ENCOUNTER — Inpatient Hospital Stay (HOSPITAL_BASED_OUTPATIENT_CLINIC_OR_DEPARTMENT_OTHER): Admitting: Internal Medicine

## 2024-01-04 VITALS — BP 136/66 | HR 70 | Temp 97.8°F | Resp 17 | Ht 69.0 in | Wt 221.0 lb

## 2024-01-04 DIAGNOSIS — R112 Nausea with vomiting, unspecified: Secondary | ICD-10-CM | POA: Insufficient documentation

## 2024-01-04 DIAGNOSIS — C712 Malignant neoplasm of temporal lobe: Secondary | ICD-10-CM | POA: Diagnosis not present

## 2024-01-04 DIAGNOSIS — C719 Malignant neoplasm of brain, unspecified: Secondary | ICD-10-CM

## 2024-01-04 DIAGNOSIS — Z923 Personal history of irradiation: Secondary | ICD-10-CM | POA: Diagnosis not present

## 2024-01-04 DIAGNOSIS — Z87891 Personal history of nicotine dependence: Secondary | ICD-10-CM | POA: Insufficient documentation

## 2024-01-04 LAB — CBC WITH DIFFERENTIAL (CANCER CENTER ONLY)
Abs Immature Granulocytes: 0.01 K/uL (ref 0.00–0.07)
Basophils Absolute: 0.1 K/uL (ref 0.0–0.1)
Basophils Relative: 1 %
Eosinophils Absolute: 0.3 K/uL (ref 0.0–0.5)
Eosinophils Relative: 7 %
HCT: 36.2 % — ABNORMAL LOW (ref 39.0–52.0)
Hemoglobin: 12.6 g/dL — ABNORMAL LOW (ref 13.0–17.0)
Immature Granulocytes: 0 %
Lymphocytes Relative: 21 %
Lymphs Abs: 0.9 K/uL (ref 0.7–4.0)
MCH: 28.1 pg (ref 26.0–34.0)
MCHC: 34.8 g/dL (ref 30.0–36.0)
MCV: 80.8 fL (ref 80.0–100.0)
Monocytes Absolute: 0.6 K/uL (ref 0.1–1.0)
Monocytes Relative: 13 %
Neutro Abs: 2.5 K/uL (ref 1.7–7.7)
Neutrophils Relative %: 58 %
Platelet Count: 143 K/uL — ABNORMAL LOW (ref 150–400)
RBC: 4.48 MIL/uL (ref 4.22–5.81)
RDW: 13.7 % (ref 11.5–15.5)
WBC Count: 4.3 K/uL (ref 4.0–10.5)
nRBC: 0 % (ref 0.0–0.2)

## 2024-01-04 LAB — CMP (CANCER CENTER ONLY)
ALT: 20 U/L (ref 0–44)
AST: 17 U/L (ref 15–41)
Albumin: 3.9 g/dL (ref 3.5–5.0)
Alkaline Phosphatase: 58 U/L (ref 38–126)
Anion gap: 7 (ref 5–15)
BUN: 11 mg/dL (ref 8–23)
CO2: 31 mmol/L (ref 22–32)
Calcium: 9.3 mg/dL (ref 8.9–10.3)
Chloride: 102 mmol/L (ref 98–111)
Creatinine: 0.69 mg/dL (ref 0.61–1.24)
GFR, Estimated: >60 mL/min (ref >60)
Glucose, Bld: 281 mg/dL — ABNORMAL HIGH (ref 70–99)
Potassium: 3.6 mmol/L (ref 3.5–5.1)
Sodium: 140 mmol/L (ref 135–145)
Total Bilirubin: 0.7 mg/dL (ref 0.0–1.2)
Total Protein: 6.7 g/dL (ref 6.5–8.1)

## 2024-01-04 MED ORDER — TEMOZOLOMIDE 140 MG PO CAPS
140.0000 mg | ORAL_CAPSULE | Freq: Every day | ORAL | 0 refills | Status: DC
Start: 2024-01-04 — End: 2024-02-03
  Filled 2024-01-04: qty 5, 28d supply, fill #0

## 2024-01-04 MED ORDER — TEMOZOLOMIDE 180 MG PO CAPS: 180.0000 mg | ORAL_CAPSULE | Freq: Every day | ORAL | 0 refills | Status: DC

## 2024-01-04 MED ORDER — ONDANSETRON HCL 8 MG PO TABS
8.0000 mg | ORAL_TABLET | Freq: Three times a day (TID) | ORAL | 1 refills | Status: AC | PRN
  Filled 2024-01-04 (×2): qty 30, 10d supply, fill #0

## 2024-01-04 MED ORDER — TEMOZOLOMIDE 140 MG PO CAPS: 140.0000 mg | ORAL_CAPSULE | Freq: Every day | ORAL | 0 refills | Status: DC

## 2024-01-04 MED ORDER — TEMOZOLOMIDE 180 MG PO CAPS
180.0000 mg | ORAL_CAPSULE | Freq: Every day | ORAL | 0 refills | Status: DC
Start: 2024-01-04 — End: 2024-02-03
  Filled 2024-01-04: qty 5, 28d supply, fill #0

## 2024-01-04 NOTE — Telephone Encounter (Signed)
 PT referral faxed to [facility], [place] office, fax #[phone].  Fax confirmation received.

## 2024-01-04 NOTE — Telephone Encounter (Signed)
 Oral Oncology Patient Advocate Encounter  After completing a benefits investigation, prior authorization for temozolomide  (TEMODAR ) 140 & 180 MG capsule  is not required at this time through Medicare Part B.  Patient's copay is $0.00.     Lucie Lamer, CPhT [place]  [facility] Oncology Pharmacy Patient Advocate Specialist [address] [place] Phone: [phone]  Fax: [phone] [email or web address]

## 2024-01-04 NOTE — Progress Notes (Signed)
 Specialty Pharmacy Initiation Note   Joel Williams is a 73 y.o. male who will be followed by the specialty pharmacy service for RxSp Oncology    Review of administration, indication, effectiveness, safety, potential side effects, storage/disposable, and missed dose instructions occurred today for patient's specialty medication(s) Temodar  (temozolomide )   Patient/Caregiver did not have any additional questions or concerns.   Patient's therapy is appropriate to: Initiate  Patient initially counseled under telephone encounter from 10/07/23.    Goals Addressed             This Visit's Progress    Slow Disease Progression       Patient is initiating therapy. Patient will maintain adherence.        [name], PharmD, BCPS, BCOP Hematology/Oncology Clinical Pharmacist 01/04/2024 1:14 PM

## 2024-01-04 NOTE — Progress Notes (Signed)
 [facility] at [facility] [address]  [place], [place] [address] [phone]   Interval Evaluation  Date of Service: 01/04/24 Patient Name: Joel Williams Patient MRN: [id number] Patient DOB: Aug 03, 1950 Provider: Arthea MARLA Manns, MD  Identifying Statement:  [name] is a 73 y.o. male with right temporal glioblastoma    Oncologic History: Oncology History  Glioblastoma, IDH-wildtype (HCC)  09/17/2023 Initial Diagnosis   Glioblastoma, IDH-wildtype (HCC)   10/14/2023 -  Chemotherapy   Patient is on Treatment Plan : BRAIN GLIOBLASTOMA Radiation Therapy With Concurrent Temozolomide  75 mg/m2 Daily Followed By Sequential Maintenance Temozolomide  x 6-12 cycles       Biomarkers:  MGMT Unknown.  IDH 1/2 Unknown.  EGFR Unknown  TERT Unknown   Interval History: Joel Williams presents today for follow up, now having completed radiation and Temodar , post treatment MRI scan.  Doesn't report new or progressive changes today.  Headaches have not recurred.  He denies seizures, gait difficulty.  Fatigue is moderate overall.  H+P (10/07/23) Patient presented to neurologic attention last month with several weeks of confusion, fatigue, lethargy, visual complaints.  MRI brain was obtained which demonstrated a large enhancing mass in the right temporal lobe, consistent with primary brain tumor.  He underwent craniotomy, resection with Dr. Lanis on 09/16/23; path demonstrated glioblastoma.  Since surgery he has been doing well, walks on his own, independent with activities of daily living.  He has been having daily headaches, dosing Tylenol , Fioricet  and Topamax  25mg  twice per day for prevention.  Medications: Current Outpatient Medications on File Prior to Visit  Medication Sig Dispense Refill   albuterol  (VENTOLIN  HFA) 108 (90 Base) MCG/ACT inhaler Inhale 1-2 puffs into the lungs every 6 (six) hours as needed for wheezing or shortness of breath. 6.7 g  0   amLODipine  (NORVASC ) 10 MG tablet TAKE 1 TABLET DAILY 90 tablet 2   aspirin  EC 81 MG tablet Take 1 tablet (81 mg total) by mouth daily. Swallow whole.     Continuous Glucose Sensor (FREESTYLE LIBRE 3 PLUS SENSOR) MISC apply to upper back of arm for 90 days change sensor every 15 days IC-10 CODE: E11.9     insulin  aspart (NOVOLOG ) 100 UNIT/ML injection Inject 0-20 Units into the skin 4 (four) times daily -  before meals and at bedtime. CBG 70 - 120: 0 units CBG 121 - 150: 3 units CBG 151 - 200: 4 units CBG 201 - 250: 7 units CBG 251 - 300: 11 units CBG 301 - 350: 15 units CBG 351 - 400: 20 units CBG > 400: call MD and obtain STAT lab verification     insulin  aspart (NOVOLOG ) 100 UNIT/ML injection Inject 5 Units into the skin with breakfast, with lunch, and with evening meal.     irbesartan  (AVAPRO ) 150 MG tablet Take 1 tablet (150 mg total) by mouth daily. 30 tablet 0   loratadine  (CLARITIN ) 10 MG tablet Take 10 mg by mouth daily.     LORazepam  (ATIVAN ) 1 MG tablet Take 1 tablet (1 mg total) by mouth once as needed for up to 1 dose for anxiety (MRI claustrophobia). 3 tablet 0   metFORMIN  (GLUCOPHAGE -XR) 500 MG 24 hr tablet Take 2 tablets (1,000 mg total) by mouth 2 (two) times daily. 120 tablet 0   metoprolol  succinate (TOPROL -XL) 50 MG 24 hr tablet Take 1 tablet (50 mg total) by mouth daily. Take with or immediately following a meal. 90 tablet 2   ondansetron  (  ZOFRAN ) 8 MG tablet Take 1 tablet (8 mg total) by mouth every 8 (eight) hours as needed for nausea or vomiting. May take 30-60 minutes prior to Temodar  administration if nausea/vomiting occurs as needed. 30 tablet 1   pantoprazole  (PROTONIX ) 40 MG tablet Take 1 tablet (40 mg total) by mouth at bedtime. 90 tablet 3   simvastatin  (ZOCOR ) 20 MG tablet TAKE 1 TABLET DAILY 90 tablet 2   No current facility-administered medications on file prior to visit.    Allergies:  Allergies  Allergen Reactions   Invokana [Canagliflozin]     Yeast  infections   Trulicity [Dulaglutide] Other (See Comments)    yeast infections   Past Medical History:  Past Medical History:  Diagnosis Date   Allergic rhinitis    Arthritis    ASTHMA 06/08/2008   Asthma    as a child   CAD (coronary artery disease)    a. s/p Promus DES (3.0 x 16 mm) to mid LAD 05/16/10;  b. Myoview  05/15/10: anteroseptal ischemia with EF 52%;   c. Cath 05/16/10: single vessel CAD with mLAD 90% tx with PCI and EF 50%;  d. 07/2011 Cath:  Patent stent, nonobs dzs, NL EF.   Chronic kidney disease    was seen by Dr. Lonna and released.   Colonic polyp    DIABETES MELLITUS, TYPE II 06/08/2008   GERD (gastroesophageal reflux disease)    Hemorrhoids    HYPERLIPIDEMIA 06/08/2008   HYPERTENSION 06/08/2008   Impotence of organic origin 08/24/2008   Myocardial infarction (HCC) 04/16/2010   Pneumonia    hx of   Renal vein thrombosis (HCC)    Seasonal allergies    Status post craniotomy 09/14/2023   Past Surgical History:  Past Surgical History:  Procedure Laterality Date   APPLICATION OF CRANIAL NAVIGATION Right 09/14/2023   Procedure: COMPUTER-ASSISTED NAVIGATION, FOR CRANIAL PROCEDURE;  Surgeon: Lanis Pupa, MD;  Location: [facility] OR;  Service: Neurosurgery;  Laterality: Right;   BACK SURGERY  2011   CATARACT EXTRACTION Bilateral    2024   CORONARY ANGIOPLASTY WITH STENT PLACEMENT     CRANIOTOMY Right 09/14/2023   Procedure: STEREOTACTIC RIGHT TEMPRO-OCCIPITAL CRANIOTOMY FOR RESECTION OF TUMOR;  Surgeon: Lanis Pupa, MD;  Location: [facility] OR;  Service: Neurosurgery;  Laterality: Right;   LEFT HEART CATHETERIZATION WITH CORONARY ANGIOGRAM N/A 07/29/2011   Procedure: LEFT HEART CATHETERIZATION WITH CORONARY ANGIOGRAM;  Surgeon: Lonni JONETTA Cash, MD;  Location: [facility] CATH LAB;  Service: Cardiovascular;  Laterality: N/A;   LEFT HEART CATHETERIZATION WITH CORONARY ANGIOGRAM N/A 06/14/2013   Procedure: LEFT HEART CATHETERIZATION WITH CORONARY ANGIOGRAM;  Surgeon:  Lonni JONETTA Cash, MD;  Location: [facility] CATH LAB;  Service: Cardiovascular;  Laterality: N/A;   RADIOLOGY WITH ANESTHESIA N/A 06/09/2022   Procedure: MRI WITH ANESTHESIA OF LUMBAR SPINE WITHOUT CONTRAST;  Surgeon: Radiologist, Medication, MD;  Location: [facility] OR;  Service: Radiology;  Laterality: N/A;   Social History:  Social History   Socioeconomic History   Marital status: Married    Spouse name: Not on file   Number of children: 2   Years of education: Not on file   Highest education level: Not on file  Occupational History   Occupation: Sales  Tobacco Use   Smoking status: Former    Current packs/day: 0.00    Average packs/day: 2.0 packs/day for 53.0 years (106.0 ttl pk-yrs)    Types: Cigarettes    Start date: 05/22/1949    Quit date: 05/23/2002    Years since quitting:  21.6   Smokeless tobacco: Never  Vaping Use   Vaping status: Never Used  Substance and Sexual Activity   Alcohol use: Not Currently    Comment: Ocassional since starting Ozempic - fall 2023.   Drug use: No   Sexual activity: Not Currently  Other Topics Concern   Not on file  Social History Narrative   Not on file   Social Drivers of Health   Financial Resource Strain: Low Risk  (12/25/2022)   Overall Financial Resource Strain (CARDIA)    Difficulty of Paying Living Expenses: Not hard at all  Food Insecurity: No Food Insecurity (10/07/2023)   Hunger Vital Sign    Worried About Running Out of Food in the Last Year: Never true    Ran Out of Food in the Last Year: Never true  Transportation Needs: No Transportation Needs (10/07/2023)   PRAPARE - Administrator, Civil Service (Medical): No    Lack of Transportation (Non-Medical): No  Physical Activity: Insufficiently Active (12/25/2022)   Exercise Vital Sign    Days of Exercise per Week: 2 days    Minutes of Exercise per Session: 30 min  Stress: No Stress Concern Present (12/25/2022)   [employer] of Occupational Health - Occupational  Stress Questionnaire    Feeling of Stress : Not at all  Social Connections: Socially Integrated (09/14/2023)   Social Connection and Isolation Panel    Frequency of Communication with Friends and Family: More than three times a week    Frequency of Social Gatherings with Friends and Family: More than three times a week    Attends Religious Services: More than 4 times per year    Active Member of [employer] or Organizations: Yes    Attends Engineer, structural: More than 4 times per year    Marital Status: Married  Catering manager Violence: Not At Risk (10/07/2023)   Humiliation, Afraid, Rape, and Kick questionnaire    Fear of Current or Ex-Partner: No    Emotionally Abused: No    Physically Abused: No    Sexually Abused: No   Family History:  Family History  Problem Relation Age of Onset   Diabetes Father    Heart disease Father    Angina Father    Dementia Mother     Review of Systems: Constitutional: Doesn't report fevers, chills or abnormal weight loss Eyes: Doesn't report blurriness of vision Ears, nose, mouth, throat, and face: Doesn't report sore throat Respiratory: Doesn't report cough, dyspnea or wheezes Cardiovascular: Doesn't report palpitation, chest discomfort  Gastrointestinal:  Doesn't report nausea, constipation, diarrhea GU: Doesn't report incontinence Skin: Doesn't report skin rashes Neurological: Per HPI Musculoskeletal: Doesn't report joint pain Behavioral/Psych: Doesn't report anxiety  Physical Exam: Vitals:   01/04/24 1123  BP: 136/66  Pulse: 70  Resp: 17  Temp: 97.8 F (36.6 C)  SpO2: 99%    KPS: 90. General: Alert, cooperative, pleasant, in no acute distress Head: Normal EENT: No conjunctival injection or scleral icterus.  Lungs: Resp effort normal Cardiac: Regular rate Abdomen: Non-distended abdomen Skin: No rashes cyanosis or petechiae. Extremities: No clubbing or edema  Neurologic Exam: Mental Status: Awake, alert, attentive to  examiner. Oriented to self and environment. Language is fluent with intact comprehension.  Cranial Nerves: Visual acuity is grossly normal. Visual fields are full. Extra-ocular movements intact. No ptosis. Face is symmetric Motor: Tone and bulk are normal. Power is full in both arms and legs. Reflexes are symmetric, no pathologic reflexes present.  Sensory:  Intact to light touch Gait: Normal.   Labs: I have reviewed the data as listed    Component Value Date/Time   NA 142 12/07/2023 1042   NA 137 07/16/2017 0000   K 4.8 12/07/2023 1042   CL 105 12/07/2023 1042   CO2 30 12/07/2023 1042   GLUCOSE 92 12/07/2023 1042   BUN 13 12/07/2023 1042   BUN 16 07/16/2017 0000   CREATININE 0.83 12/07/2023 1042   CREATININE 0.72 02/24/2016 0806   CALCIUM 9.6 12/07/2023 1042   PROT 7.0 12/07/2023 1042   PROT 6.8 08/19/2023 0940   ALBUMIN 4.1 12/07/2023 1042   AST 21 12/07/2023 1042   ALT 21 12/07/2023 1042   ALKPHOS 60 12/07/2023 1042   BILITOT 0.7 12/07/2023 1042   GFRNONAA >60 12/07/2023 1042   GFRAA >90 07/29/2011 1120   Lab Results  Component Value Date   WBC 4.3 01/04/2024   NEUTROABS 2.5 01/04/2024   HGB 12.6 (L) 01/04/2024   HCT 36.2 (L) 01/04/2024   MCV 80.8 01/04/2024   PLT 143 (L) 01/04/2024    Imaging:  [facility] Clinician Interpretation: I have personally reviewed the CNS images as listed.  My interpretation, in the context of the patient's clinical presentation, is stable disease  MR BRAIN W WO CONTRAST Result Date: 12/30/2023 EXAM: MRI BRAIN WITH AND WITHOUT CONTRAST 12/30/2023 11:26:54 AM TECHNIQUE: Multiplanar multisequence MRI of the head/brain was performed with and without the administration of 10 ml of Vueway intravenous contrast. COMPARISON: MRI head 10/05/2023 and earlier. CLINICAL HISTORY: Brain/CNS neoplasm, assess treatment response. Glioblastoma, IDH-wildtype. FINDINGS: BRAIN AND VENTRICLES: The right-sided craniotomy cystic resection cavity in the posterior right  temporal lobe has a similar appearance and is slightly decreased in size. There is interval resolution of pneumocephalus. Mild T2 FLAIR hyperintensity along the margin of the resection cavity, particularly along the medial and posterior margins, is overall decreased since the prior study. There is ex vacuo dilatation of the right lateral ventricle, particularly the atrium of the lateral ventricle, which is increased since the prior study. Previously noted postoperative blood products at the resection cavity are significantly decreased since the prior MRI. There is thin curvilinear enhancement along the right side of the resection cavity, likely reflecting postoperative changes. No areas of nodular or masslike enhancement are appreciated. Areas of restricted diffusion are present along the margin of the resection cavity, likely reflecting devitalized tissue. There is no evidence of acute infarct. No new intracranial lesions are noted. Mild supratentorial white matter signal abnormality and signal abnormality in the pons likely reflect chronic microvascular ischemic changes. There is generalized parenchymal volume loss. Mild dural thickening and enhancement are present underlying the craniotomy site over the posterior right temporal lobe. Areas of thin intrinsic T1 hyperintensity along the posterior margin of the resection cavity are compatible with postoperative blood products. No mass effect or midline shift. No hydrocephalus. The sella is unremarkable. Normal flow voids. ORBITS: Bilateral lens replacement. SINUSES: Mucosal thickening in the left maxillary sinus. BONES AND SOFT TISSUES: Right mastoid effusion. Normal bone marrow signal and enhancement. No acute soft tissue abnormality. IMPRESSION: 1. No evidence of recurrent or residual enhancing tumor. 2. Postoperative changes in the right posterior temporal lobe. Decreased T2/FLAIR hyperintensity at the resection site. Thin curvilinear enhancement along the  resection cavity margin, likely postoperative. 3. Increased ex vacuo dilatation of the right lateral ventricle since the prior study. 4. Mild supratentorial and pontine chronic microvascular ischemic changes. Electronically signed by: Donnice Mania MD 12/30/2023 12:15 PM EDT RP  Workstation: [id number]    Assessment/Plan Glioblastoma, IDH-wildtype (HCC)  PHAROAH GOGGINS is clinically stable today, now having completed IMRT and concurrent Temodar .  MRI brain demonstrates stable findings.  We recommended initiating treatment with cycle #1 Temozolomide  150mg /m2, on for five days and off for twenty three days in twenty eight day cycles. The patient will have a complete blood count performed on days 21 and 28 of each cycle, and a comprehensive metabolic panel performed on day 28 of each cycle. Labs may need to be performed more often. Zofran  will prescribed for home use for nausea/vomiting.   Informed consent was obtained verbally at bedside to proceed with oral chemotherapy.  Chemotherapy should be held for the following:  ANC less than 1,000  Platelets less than 100,000  LFT or creatinine greater than 2x ULN  If clinical concerns/contraindications develop  We ask that AVERIE MEINER return to clinic in 1 months with labs prior to cycle #2, or sooner as needed.  All questions were answered. The patient knows to call the clinic with any problems, questions or concerns. No barriers to learning were detected.  The total time spent in the encounter was 40 minutes and more than 50% was on counseling and review of test results   Arthea MARLA Manns, MD Medical Director of Neuro-Oncology [facility] at [facility] 01/04/24 11:20 AM

## 2024-01-04 NOTE — Progress Notes (Signed)
 Specialty Pharmacy Initial Fill Coordination Note  Joel Williams is a 73 y.o. male contacted today regarding refills of specialty medication(s) Temozolomide  (TEMODAR ) .  Patient requested Delivery  on 01/05/24  to verified address [address]   [facility] [place] [address]   Medication will be filled on 01/04/24.   Patient is aware of 0.00 copayment billed under Medicare Part B.   Please add zofran  to the delivery.

## 2024-01-04 NOTE — Progress Notes (Signed)
 Oral Oncology Pharmacist Encounter  Temozolomide  prescription instructions updated to reflect maintenance dosing instructions for 5 days on, 23 days off, repeated every 28 days. Prescription resent to [facility] for dispensing.   [name], PharmD, BCPS, BCOP Hematology/Oncology Clinical Pharmacist [phone] 01/04/2024 11:56 AM

## 2024-01-06 ENCOUNTER — Other Ambulatory Visit: Payer: Self-pay

## 2024-01-07 DIAGNOSIS — H8112 Benign paroxysmal vertigo, left ear: Secondary | ICD-10-CM | POA: Diagnosis not present

## 2024-01-07 DIAGNOSIS — R269 Unspecified abnormalities of gait and mobility: Secondary | ICD-10-CM | POA: Diagnosis not present

## 2024-01-07 DIAGNOSIS — M6281 Muscle weakness (generalized): Secondary | ICD-10-CM | POA: Diagnosis not present

## 2024-01-10 ENCOUNTER — Other Ambulatory Visit: Payer: Self-pay | Admitting: Student in an Organized Health Care Education/Training Program

## 2024-01-10 MED ORDER — IRBESARTAN 150 MG PO TABS: 150.0000 mg | ORAL_TABLET | Freq: Every day | ORAL | 3 refills | Status: DC

## 2024-01-10 NOTE — Telephone Encounter (Signed)
 Called patient to let them know medication has been sent in

## 2024-01-10 NOTE — Telephone Encounter (Signed)
 Copied from CRM [id number]. Topic: Clinical - Medication Refill >> Jan 10, 2024  9:32 AM Alfonso HERO wrote: Medication: ondansetron  (ZOFRAN ) 150 MG tablet  Has the patient contacted their pharmacy? Yes (Agent: If no, request that the patient contact the pharmacy for the refill. If patient does not wish to contact the pharmacy document the reason why and proceed with request.) (Agent: If yes, when and what did the pharmacy advise?)  This is the patient's preferred pharmacy:    [facility] - [place], [place] - [address] [place] [address] Phone: [phone] Fax: [phone]    Is this the correct pharmacy for this prescription? Yes If no, delete pharmacy and type the correct one.   Has the prescription been filled recently? Yes  Is the patient out of the medication? No  Has the patient been seen for an appointment in the last year OR does the patient have an upcoming appointment? Yes  Can we respond through MyChart? Yes  Agent: Please be advised that Rx refills may take up to 3 business days. We ask that you follow-up with your pharmacy.

## 2024-01-10 NOTE — Telephone Encounter (Signed)
 It looks like the patient just had 30 tablets of Zofran  8 mg dispensed on 10/22.  This medicine is to support him during chemotherapy.  Seems too early for refill, please check in with the patient.

## 2024-01-10 NOTE — Telephone Encounter (Signed)
 E2C2 took down the incorrect medication. Patient is needing a refill on  Requested Prescriptions   Pending Prescriptions Disp Refills   irbesartan  (AVAPRO ) 150 MG tablet 30 tablet 0    Sig: Take 1 tablet (150 mg total) by mouth daily.     Date of patient request: 01/10/2024 Last office visit: 11/23/2023 Upcoming visit: 02/22/2024 Date of last refill: 09/24/2023 by PA Toribio Pitch  Last refill amount: 30 tab 0 refills    [facility]

## 2024-01-11 ENCOUNTER — Telehealth: Payer: Self-pay | Admitting: Internal Medicine

## 2024-01-11 DIAGNOSIS — R269 Unspecified abnormalities of gait and mobility: Secondary | ICD-10-CM | POA: Diagnosis not present

## 2024-01-11 DIAGNOSIS — H8112 Benign paroxysmal vertigo, left ear: Secondary | ICD-10-CM | POA: Diagnosis not present

## 2024-01-11 DIAGNOSIS — M6281 Muscle weakness (generalized): Secondary | ICD-10-CM | POA: Diagnosis not present

## 2024-01-11 NOTE — Telephone Encounter (Signed)
 Scheduled patient for next appointments. Spoke with the patients wife, she is aware.

## 2024-01-14 DIAGNOSIS — M6281 Muscle weakness (generalized): Secondary | ICD-10-CM | POA: Diagnosis not present

## 2024-01-14 DIAGNOSIS — R269 Unspecified abnormalities of gait and mobility: Secondary | ICD-10-CM | POA: Diagnosis not present

## 2024-01-14 DIAGNOSIS — H8112 Benign paroxysmal vertigo, left ear: Secondary | ICD-10-CM | POA: Diagnosis not present

## 2024-01-18 ENCOUNTER — Ambulatory Visit: Admitting: Radiology

## 2024-01-18 DIAGNOSIS — M6281 Muscle weakness (generalized): Secondary | ICD-10-CM | POA: Diagnosis not present

## 2024-01-18 DIAGNOSIS — H8112 Benign paroxysmal vertigo, left ear: Secondary | ICD-10-CM | POA: Diagnosis not present

## 2024-01-18 DIAGNOSIS — R269 Unspecified abnormalities of gait and mobility: Secondary | ICD-10-CM | POA: Diagnosis not present

## 2024-01-21 DIAGNOSIS — R269 Unspecified abnormalities of gait and mobility: Secondary | ICD-10-CM | POA: Diagnosis not present

## 2024-01-21 DIAGNOSIS — M6281 Muscle weakness (generalized): Secondary | ICD-10-CM | POA: Diagnosis not present

## 2024-01-21 DIAGNOSIS — H8112 Benign paroxysmal vertigo, left ear: Secondary | ICD-10-CM | POA: Diagnosis not present

## 2024-01-25 ENCOUNTER — Other Ambulatory Visit: Payer: Self-pay

## 2024-01-25 NOTE — Progress Notes (Signed)
 Specialty Pharmacy Ongoing Clinical Assessment Note  Joel Williams is a 73 y.o. male who is being followed by the specialty pharmacy service for RxSp Oncology   Patient's specialty medication(s) reviewed today: Temozolomide  (TEMODAR )   Missed doses in the last 4 weeks: 0   Patient/Caregiver did not have any additional questions or concerns.   Therapeutic benefit summary: Patient is achieving benefit (per office visist note from 01/04/24, the recent MRI showed stable findings)   Adverse events/side effects summary: Experienced adverse events/side effects (loss of appetite and fatigue, both remain tolerable at this time)   Patient's therapy is appropriate to: Continue    Goals Addressed             This Visit's Progress    Slow Disease Progression   On track    Patient is on track. Patient will maintain adherence.  Per Dr. Eward office visit note from 01/04/24 the recent MRI showed stable findings.          Follow up: 3 months  Joel Williams Specialty Pharmacist

## 2024-01-28 DIAGNOSIS — R269 Unspecified abnormalities of gait and mobility: Secondary | ICD-10-CM | POA: Diagnosis not present

## 2024-01-28 DIAGNOSIS — H8112 Benign paroxysmal vertigo, left ear: Secondary | ICD-10-CM | POA: Diagnosis not present

## 2024-01-28 DIAGNOSIS — M6281 Muscle weakness (generalized): Secondary | ICD-10-CM | POA: Diagnosis not present

## 2024-01-31 ENCOUNTER — Ambulatory Visit (INDEPENDENT_AMBULATORY_CARE_PROVIDER_SITE_OTHER): Admitting: Audiology

## 2024-01-31 ENCOUNTER — Other Ambulatory Visit (INDEPENDENT_AMBULATORY_CARE_PROVIDER_SITE_OTHER): Payer: Self-pay | Admitting: Physician Assistant

## 2024-01-31 DIAGNOSIS — H9190 Unspecified hearing loss, unspecified ear: Secondary | ICD-10-CM

## 2024-01-31 DIAGNOSIS — H903 Sensorineural hearing loss, bilateral: Secondary | ICD-10-CM

## 2024-01-31 NOTE — Progress Notes (Signed)
 [address], [address] [place], [place] [address] [phone]  Audiological Evaluation    Name: Joel Williams     DOB:   [birth year]      MRN:   [id number]                                                                                     Service Date: 01/31/2024     Accompanied by: daughter   Patient comes today after Dr. Karis, ENT sent a referral for a hearing evaluation due to concerns with dizziness.   Symptoms Yes Details  Hearing loss  [x]  Worse in left  Tinnitus  [x]  Left ear since 2014  Ear pain/ infections/pressure  []    Balance problems  [x]  While walking, off balance, believes related to vision.   Noise exposure history  []    Previous ear surgeries  []    Family history of hearing loss  []    Amplification  [x]  Bilateral hearing aids most likely obtained from [facility].  Patient has not used the hearing aids in years  Other  [x]  Gliobastoma right temporal lobe , resected on 09-16-23 . Reports he was told his vision is affected in the left upper quadrant in both eyes    Otoscopy: Right ear: Clear external ear canal and notable landmarks visualized on the tympanic membrane. Left ear:  Clear external ear canal and notable landmarks visualized on the tympanic membrane.  Tympanometry: Right ear: Normal external ear canal volume with normal middle ear pressure and low tympanic membrane compliance (Type As). Left ear: Normal external ear canal volume with normal middle ear pressure and low tympanic membrane compliance (Type As).  Hearing Evaluation The hearing test results were completed under headphones and results are deemed to be of good reliability. Test technique:  conventional    Pure tone Audiometry: Right ear- normal hearing at [phone] Hz sloping from mild to moderately-severe sensorineural hearing loss from 2000 Hz - 8000 Hz. Left ear-  Mild sloping to profound sensorineural hearing loss from 250 Hz - 8000 Hz.  Speech Audiometry: Right ear- Speech  Reception Threshold (SRT) was obtained at 25 dBHL. Left ear-Speech Reception Threshold (SRT) was obtained at 45 dBHL.   Word Recognition Score Tested using NU-6 (recorded) Right ear: 96% was obtained at a presentation level of 70 dBHL with contralateral masking which is deemed as  excellent. Left ear: 30% was obtained at a presentation level of 90 dBHL with contralateral masking which is deemed as  poor.   Impression: There is a significant difference in pure-tone thresholds between ears. There is a significant difference in word recognition scores , worse of the left ear.    Of note- patient brought copies of previous audiograms at [facility] and will be scanned to his files. Most recent was from 2017 and his hearing in the left ear has significantly deteriorated when compared to today's hearing test.   Recommendations: Follow up with ENT as scheduled for a later date.   Return for a hearing evaluation if concerns with hearing changes arise or per MD recommendation.  Consider a communication needs assessment after medical clearance for hearing aids is obtained.  Martavius Lusty MARIE LEROUX-MARTINEZ, AUD

## 2024-02-01 DIAGNOSIS — H8112 Benign paroxysmal vertigo, left ear: Secondary | ICD-10-CM | POA: Diagnosis not present

## 2024-02-01 DIAGNOSIS — M6281 Muscle weakness (generalized): Secondary | ICD-10-CM | POA: Diagnosis not present

## 2024-02-01 DIAGNOSIS — R269 Unspecified abnormalities of gait and mobility: Secondary | ICD-10-CM | POA: Diagnosis not present

## 2024-02-03 ENCOUNTER — Other Ambulatory Visit: Payer: Self-pay

## 2024-02-03 ENCOUNTER — Inpatient Hospital Stay: Attending: Radiation Oncology

## 2024-02-03 ENCOUNTER — Telehealth: Payer: Self-pay | Admitting: Pharmacist

## 2024-02-03 ENCOUNTER — Inpatient Hospital Stay (HOSPITAL_BASED_OUTPATIENT_CLINIC_OR_DEPARTMENT_OTHER): Admitting: Internal Medicine

## 2024-02-03 VITALS — BP 135/65 | HR 68 | Temp 97.5°F | Resp 18 | Wt 223.7 lb

## 2024-02-03 DIAGNOSIS — Z79899 Other long term (current) drug therapy: Secondary | ICD-10-CM | POA: Diagnosis not present

## 2024-02-03 DIAGNOSIS — Z87891 Personal history of nicotine dependence: Secondary | ICD-10-CM | POA: Diagnosis not present

## 2024-02-03 DIAGNOSIS — C719 Malignant neoplasm of brain, unspecified: Secondary | ICD-10-CM

## 2024-02-03 DIAGNOSIS — R112 Nausea with vomiting, unspecified: Secondary | ICD-10-CM | POA: Insufficient documentation

## 2024-02-03 DIAGNOSIS — C712 Malignant neoplasm of temporal lobe: Secondary | ICD-10-CM | POA: Diagnosis not present

## 2024-02-03 LAB — CBC WITH DIFFERENTIAL (CANCER CENTER ONLY)
Abs Immature Granulocytes: 0.01 K/uL (ref 0.00–0.07)
Basophils Absolute: 0.1 K/uL (ref 0.0–0.1)
Basophils Relative: 1 %
Eosinophils Absolute: 0.1 K/uL (ref 0.0–0.5)
Eosinophils Relative: 3 %
HCT: 37.1 % — ABNORMAL LOW (ref 39.0–52.0)
Hemoglobin: 12.6 g/dL — ABNORMAL LOW (ref 13.0–17.0)
Immature Granulocytes: 0 %
Lymphocytes Relative: 21 %
Lymphs Abs: 1 K/uL (ref 0.7–4.0)
MCH: 27.8 pg (ref 26.0–34.0)
MCHC: 34 g/dL (ref 30.0–36.0)
MCV: 81.7 fL (ref 80.0–100.0)
Monocytes Absolute: 0.6 K/uL (ref 0.1–1.0)
Monocytes Relative: 12 %
Neutro Abs: 3 K/uL (ref 1.7–7.7)
Neutrophils Relative %: 63 %
Platelet Count: 153 K/uL (ref 150–400)
RBC: 4.54 MIL/uL (ref 4.22–5.81)
RDW: 13.7 % (ref 11.5–15.5)
WBC Count: 4.8 K/uL (ref 4.0–10.5)
nRBC: 0 % (ref 0.0–0.2)

## 2024-02-03 LAB — CMP (CANCER CENTER ONLY)
ALT: 17 U/L (ref 0–44)
AST: 18 U/L (ref 15–41)
Albumin: 4.1 g/dL (ref 3.5–5.0)
Alkaline Phosphatase: 71 U/L (ref 38–126)
Anion gap: 11 (ref 5–15)
BUN: 11 mg/dL (ref 8–23)
CO2: 28 mmol/L (ref 22–32)
Calcium: 9.1 mg/dL (ref 8.9–10.3)
Chloride: 101 mmol/L (ref 98–111)
Creatinine: 0.66 mg/dL (ref 0.61–1.24)
GFR, Estimated: >60 mL/min (ref >60)
Glucose, Bld: 186 mg/dL — ABNORMAL HIGH (ref 70–99)
Potassium: 3.5 mmol/L (ref 3.5–5.1)
Sodium: 140 mmol/L (ref 135–145)
Total Bilirubin: 0.7 mg/dL (ref 0.0–1.2)
Total Protein: 6.6 g/dL (ref 6.5–8.1)

## 2024-02-03 MED ORDER — TEMOZOLOMIDE 100 MG PO CAPS
400.0000 mg | ORAL_CAPSULE | Freq: Every day | ORAL | 0 refills | Status: DC
  Filled 2024-02-03: qty 28, 7d supply, fill #0

## 2024-02-03 MED ORDER — TEMOZOLOMIDE 100 MG PO CAPS
400.0000 mg | ORAL_CAPSULE | Freq: Every day | ORAL | 0 refills | Status: AC
  Filled 2024-02-03: qty 20, 28d supply, fill #0

## 2024-02-03 NOTE — Progress Notes (Signed)
 [facility] at [facility] [address]  [place], [place] [address] [phone]   Interval Evaluation  Date of Service: 02/03/24 Patient Name: Joel Williams Patient MRN: [id number] Patient DOB: Nov 05, 1950 Provider: Arthea MARLA Manns, MD  Identifying Statement:  Joel Williams is a 73 y.o. male with right temporal glioblastoma    Oncologic History: Oncology History  Glioblastoma, IDH-wildtype (HCC)  09/17/2023 Initial Diagnosis   Glioblastoma, IDH-wildtype (HCC)   10/14/2023 - 12/07/2023 Radiation Therapy   Completes 6 weeks IMRT and concurrent Temodar  75mg /m2 Audry)     Biomarkers:  MGMT Unknown.  IDH 1/2 Unknown.  EGFR Unknown  TERT Unknown   Interval History: Joel Williams presents today for follow up, now having completed cycle #1 of 5-day Temodar .  He did well with chemo overall, no nausea.  Doesn't report new or progressive changes today.  Headaches have not recurred.  He denies seizures, gait difficulty.  Fatigue is moderate overall.  H+P (10/07/23) Patient presented to neurologic attention last month with several weeks of confusion, fatigue, lethargy, visual complaints.  MRI brain was obtained which demonstrated a large enhancing mass in the right temporal lobe, consistent with primary brain tumor.  He underwent craniotomy, resection with Dr. Lanis on 09/16/23; path demonstrated glioblastoma.  Since surgery he has been doing well, walks on his own, independent with activities of daily living.  He has been having daily headaches, dosing Tylenol , Fioricet  and Topamax  25mg  twice per day for prevention.  Medications: Current Outpatient Medications on File Prior to Visit  Medication Sig Dispense Refill   amLODipine  (NORVASC ) 10 MG tablet TAKE 1 TABLET DAILY 90 tablet 2   aspirin  EC 81 MG tablet Take 1 tablet (81 mg total) by mouth daily. Swallow whole.     Continuous Glucose Sensor (FREESTYLE LIBRE 3 PLUS SENSOR) MISC apply to upper  back of arm for 90 days change sensor every 15 days IC-10 CODE: E11.9     insulin  aspart (NOVOLOG ) 100 UNIT/ML injection Inject 0-20 Units into the skin 4 (four) times daily -  before meals and at bedtime. CBG 70 - 120: 0 units CBG 121 - 150: 3 units CBG 151 - 200: 4 units CBG 201 - 250: 7 units CBG 251 - 300: 11 units CBG 301 - 350: 15 units CBG 351 - 400: 20 units CBG > 400: call MD and obtain STAT lab verification     insulin  aspart (NOVOLOG ) 100 UNIT/ML injection Inject 5 Units into the skin with breakfast, with lunch, and with evening meal.     irbesartan  (AVAPRO ) 150 MG tablet Take 1 tablet (150 mg total) by mouth daily. 90 tablet 3   LORazepam  (ATIVAN ) 1 MG tablet Take 1 tablet (1 mg total) by mouth once as needed for up to 1 dose for anxiety (MRI claustrophobia). 3 tablet 0   metFORMIN  (GLUCOPHAGE -XR) 500 MG 24 hr tablet Take 2 tablets (1,000 mg total) by mouth 2 (two) times daily. 120 tablet 0   metoprolol  succinate (TOPROL -XL) 50 MG 24 hr tablet Take 1 tablet (50 mg total) by mouth daily. Take with or immediately following a meal. 90 tablet 2   ondansetron  (ZOFRAN ) 8 MG tablet Take 1 tablet (8 mg total) by mouth every 8 (eight) hours as needed for nausea or vomiting. May take 30-60 minutes prior to Temodar  administration if nausea/vomiting occurs as needed. 30 tablet 1   ondansetron  (ZOFRAN ) 8 MG tablet Take 1 tablet (8 mg total) by mouth every 8 (  eight) hours as needed for nausea or vomiting. May take 30-60 minutes prior to Temodar  administration if nausea/vomiting occurs as needed. 30 tablet 1   pantoprazole  (PROTONIX ) 40 MG tablet Take 1 tablet (40 mg total) by mouth at bedtime. 90 tablet 3   simvastatin  (ZOCOR ) 20 MG tablet TAKE 1 TABLET DAILY 90 tablet 2   temozolomide  (TEMODAR ) 140 MG capsule Take 1 capsule (140 mg total) by mouth daily. (Take with ONE 180 mg capsule for total daily dose of 320 mg). Take for 5 days on, 23 days off. Repeat every 28 days. May take on an empty stomach to  decrease nausea & vomiting. 5 capsule 0   temozolomide  (TEMODAR ) 180 MG capsule Take 1 capsule (180 mg total) by mouth daily. (Take with ONE 140 mg capsule for total daily dose of 320 mg). Take for 5 days on, 23 days off. Repeat every 28 days. May take on an empty stomach to decrease nausea & vomiting. 5 capsule 0   No current facility-administered medications on file prior to visit.    Allergies:  Allergies  Allergen Reactions   Invokana [Canagliflozin]     Yeast infections   Trulicity [Dulaglutide] Other (See Comments)    yeast infections   Past Medical History:  Past Medical History:  Diagnosis Date   Allergic rhinitis    Arthritis    ASTHMA 06/08/2008   Asthma    as a child   CAD (coronary artery disease)    a. s/p Promus DES (3.0 x 16 mm) to mid LAD 05/16/10;  b. Myoview  05/15/10: anteroseptal ischemia with EF 52%;   c. Cath 05/16/10: single vessel CAD with mLAD 90% tx with PCI and EF 50%;  d. 07/2011 Cath:  Patent stent, nonobs dzs, NL EF.   Chronic kidney disease    was seen by Dr. Lonna and released.   Colonic polyp    DIABETES MELLITUS, TYPE II 06/08/2008   GERD (gastroesophageal reflux disease)    Hemorrhoids    HYPERLIPIDEMIA 06/08/2008   HYPERTENSION 06/08/2008   Impotence of organic origin 08/24/2008   Myocardial infarction (HCC) 04/16/2010   Pneumonia    hx of   Renal vein thrombosis (HCC)    Seasonal allergies    Status post craniotomy 09/14/2023   Past Surgical History:  Past Surgical History:  Procedure Laterality Date   APPLICATION OF CRANIAL NAVIGATION Right 09/14/2023   Procedure: COMPUTER-ASSISTED NAVIGATION, FOR CRANIAL PROCEDURE;  Surgeon: Lanis Pupa, MD;  Location: [facility] OR;  Service: Neurosurgery;  Laterality: Right;   BACK SURGERY  2011   CATARACT EXTRACTION Bilateral    2024   CORONARY ANGIOPLASTY WITH STENT PLACEMENT     CRANIOTOMY Right 09/14/2023   Procedure: STEREOTACTIC RIGHT TEMPRO-OCCIPITAL CRANIOTOMY FOR RESECTION OF TUMOR;  Surgeon:  Lanis Pupa, MD;  Location: [facility] OR;  Service: Neurosurgery;  Laterality: Right;   LEFT HEART CATHETERIZATION WITH CORONARY ANGIOGRAM N/A 07/29/2011   Procedure: LEFT HEART CATHETERIZATION WITH CORONARY ANGIOGRAM;  Surgeon: Lonni JONETTA Cash, MD;  Location: [facility] CATH LAB;  Service: Cardiovascular;  Laterality: N/A;   LEFT HEART CATHETERIZATION WITH CORONARY ANGIOGRAM N/A 06/14/2013   Procedure: LEFT HEART CATHETERIZATION WITH CORONARY ANGIOGRAM;  Surgeon: Lonni JONETTA Cash, MD;  Location: [facility] CATH LAB;  Service: Cardiovascular;  Laterality: N/A;   RADIOLOGY WITH ANESTHESIA N/A 06/09/2022   Procedure: MRI WITH ANESTHESIA OF LUMBAR SPINE WITHOUT CONTRAST;  Surgeon: Radiologist, Medication, MD;  Location: [facility] OR;  Service: Radiology;  Laterality: N/A;   Social History:  Social History  Socioeconomic History   Marital status: Married    Spouse name: Not on file   Number of children: 2   Years of education: Not on file   Highest education level: Not on file  Occupational History   Occupation: Sales  Tobacco Use   Smoking status: Former    Current packs/day: 0.00    Average packs/day: 2.0 packs/day for 53.0 years (106.0 ttl pk-yrs)    Types: Cigarettes    Start date: 05/22/1949    Quit date: 05/23/2002    Years since quitting: 21.7   Smokeless tobacco: Never  Vaping Use   Vaping status: Never Used  Substance and Sexual Activity   Alcohol use: Not Currently    Comment: Ocassional since starting Ozempic - fall 2023.   Drug use: No   Sexual activity: Not Currently  Other Topics Concern   Not on file  Social History Narrative   Not on file   Social Drivers of Health   Financial Resource Strain: Low Risk  (12/25/2022)   Overall Financial Resource Strain (CARDIA)    Difficulty of Paying Living Expenses: Not hard at all  Food Insecurity: No Food Insecurity (10/07/2023)   Hunger Vital Sign    Worried About Running Out of Food in the Last Year: Never true    Ran Out of Food in  the Last Year: Never true  Transportation Needs: No Transportation Needs (10/07/2023)   PRAPARE - Administrator, Civil Service (Medical): No    Lack of Transportation (Non-Medical): No  Physical Activity: Insufficiently Active (12/25/2022)   Exercise Vital Sign    Days of Exercise per Week: 2 days    Minutes of Exercise per Session: 30 min  Stress: No Stress Concern Present (12/25/2022)   [employer] of Occupational Health - Occupational Stress Questionnaire    Feeling of Stress : Not at all  Social Connections: Socially Integrated (09/14/2023)   Social Connection and Isolation Panel    Frequency of Communication with Friends and Family: More than three times a week    Frequency of Social Gatherings with Friends and Family: More than three times a week    Attends Religious Services: More than 4 times per year    Active Member of [employer] or Organizations: Yes    Attends Engineer, Structural: More than 4 times per year    Marital Status: Married  Catering Manager Violence: Not At Risk (10/07/2023)   Humiliation, Afraid, Rape, and Kick questionnaire    Fear of Current or Ex-Partner: No    Emotionally Abused: No    Physically Abused: No    Sexually Abused: No   Family History:  Family History  Problem Relation Age of Onset   Diabetes Father    Heart disease Father    Angina Father    Dementia Mother     Review of Systems: Constitutional: Doesn't report fevers, chills or abnormal weight loss Eyes: Doesn't report blurriness of vision Ears, nose, mouth, throat, and face: Doesn't report sore throat Respiratory: Doesn't report cough, dyspnea or wheezes Cardiovascular: Doesn't report palpitation, chest discomfort  Gastrointestinal:  Doesn't report nausea, constipation, diarrhea GU: Doesn't report incontinence Skin: Doesn't report skin rashes Neurological: Per HPI Musculoskeletal: Doesn't report joint pain Behavioral/Psych: Doesn't report anxiety  Physical  Exam: Vitals:   02/03/24 1220  BP: 135/65  Pulse: 68  Resp: 18  Temp: (!) 97.5 F (36.4 C)  SpO2: 97%    KPS: 90. General: Alert, cooperative, pleasant, in no acute distress  Head: Normal EENT: No conjunctival injection or scleral icterus.  Lungs: Resp effort normal Cardiac: Regular rate Abdomen: Non-distended abdomen Skin: No rashes cyanosis or petechiae. Extremities: No clubbing or edema  Neurologic Exam: Mental Status: Awake, alert, attentive to examiner. Oriented to self and environment. Language is fluent with intact comprehension.  Cranial Nerves: Visual acuity is grossly normal. Visual fields are full. Extra-ocular movements intact. No ptosis. Face is symmetric Motor: Tone and bulk are normal. Power is full in both arms and legs. Reflexes are symmetric, no pathologic reflexes present.  Sensory: Intact to light touch Gait: Normal.   Labs: I have reviewed the data as listed    Component Value Date/Time   NA 140 02/03/2024 1130   NA 137 07/16/2017 0000   K 3.5 02/03/2024 1130   CL 101 02/03/2024 1130   CO2 28 02/03/2024 1130   GLUCOSE 186 (H) 02/03/2024 1130   BUN 11 02/03/2024 1130   BUN 16 07/16/2017 0000   CREATININE 0.66 02/03/2024 1130   CREATININE 0.72 02/24/2016 0806   CALCIUM 9.1 02/03/2024 1130   PROT 6.6 02/03/2024 1130   PROT 6.8 08/19/2023 0940   ALBUMIN 4.1 02/03/2024 1130   AST 18 02/03/2024 1130   ALT 17 02/03/2024 1130   ALKPHOS 71 02/03/2024 1130   BILITOT 0.7 02/03/2024 1130   GFRNONAA >60 02/03/2024 1130   GFRAA >90 07/29/2011 1120   Lab Results  Component Value Date   WBC 4.8 02/03/2024   NEUTROABS 3.0 02/03/2024   HGB 12.6 (L) 02/03/2024   HCT 37.1 (L) 02/03/2024   MCV 81.7 02/03/2024   PLT 153 02/03/2024     Assessment/Plan Glioblastoma, IDH-wildtype (HCC)  MARKEL KURTENBACH is clinically stable today, now having completed cycle #1 of adjuvant 5-day Temodar .  Labs are within normal limits.  We recommended continuing  treatment with cycle #2 Temozolomide  150-200mg /m2, on for five days and off for twenty three days in twenty eight day cycles. The patient will have a complete blood count performed on days 21 and 28 of each cycle, and a comprehensive metabolic panel performed on day 28 of each cycle. Labs may need to be performed more often. Zofran  will prescribed for home use for nausea/vomiting.   Informed consent was obtained verbally at bedside to proceed with oral chemotherapy.  Chemotherapy should be held for the following:  ANC less than 1,000  Platelets less than 100,000  LFT or creatinine greater than 2x ULN  If clinical concerns/contraindications develop  We ask that JACINTO KEIL return to clinic in 1 months with MRI brain for evaluation prior to cycle #3, or sooner as needed.  All questions were answered. The patient knows to call the clinic with any problems, questions or concerns. No barriers to learning were detected.  The total time spent in the encounter was 30 minutes and more than 50% was on counseling and review of test results   Arthea MARLA Manns, MD Medical Director of Neuro-Oncology [facility] at [facility] 02/03/24 12:37 PM

## 2024-02-03 NOTE — Telephone Encounter (Signed)
 Oral Oncology Pharmacist Encounter  Temozolomide  sig updated to reflect change to 400 mg daily dosing for 5 days on/23 days off. Prescription will [facility] to be filled [facility].   [name], PharmD, BCPS, BCOP Hematology/Oncology Clinical Pharmacist [phone] 02/03/2024 1:33 PM

## 2024-02-03 NOTE — Progress Notes (Signed)
 Specialty Pharmacy Refill Coordination Note  Joel Williams is a 73 y.o. male contacted today regarding refills of specialty medication(s) Temozolomide  (TEMODAR )   Patient requested Delivery   Delivery date: 02/04/24   Verified address: [address]   [place] [address]   Medication will be filled on: 02/03/24

## 2024-02-04 DIAGNOSIS — H8112 Benign paroxysmal vertigo, left ear: Secondary | ICD-10-CM | POA: Diagnosis not present

## 2024-02-04 DIAGNOSIS — M6281 Muscle weakness (generalized): Secondary | ICD-10-CM | POA: Diagnosis not present

## 2024-02-04 DIAGNOSIS — R269 Unspecified abnormalities of gait and mobility: Secondary | ICD-10-CM | POA: Diagnosis not present

## 2024-02-06 ENCOUNTER — Other Ambulatory Visit: Payer: Self-pay

## 2024-02-07 ENCOUNTER — Other Ambulatory Visit: Payer: Self-pay | Admitting: Internal Medicine

## 2024-02-07 ENCOUNTER — Telehealth: Payer: Self-pay | Admitting: Internal Medicine

## 2024-02-07 MED ORDER — LORAZEPAM 1 MG PO TABS: 1.0000 mg | ORAL_TABLET | Freq: Once | ORAL | 0 refills | Status: AC | PRN

## 2024-02-07 NOTE — Telephone Encounter (Signed)
 Scheduled patient for next appointment. Called and spoke with the patients wife, she is aware.

## 2024-02-08 ENCOUNTER — Ambulatory Visit (INDEPENDENT_AMBULATORY_CARE_PROVIDER_SITE_OTHER): Admitting: Otolaryngology

## 2024-02-08 ENCOUNTER — Encounter (INDEPENDENT_AMBULATORY_CARE_PROVIDER_SITE_OTHER): Payer: Self-pay | Admitting: Otolaryngology

## 2024-02-08 VITALS — BP 120/71 | HR 70 | Temp 97.6°F | Ht 69.0 in | Wt 223.0 lb

## 2024-02-08 DIAGNOSIS — H903 Sensorineural hearing loss, bilateral: Secondary | ICD-10-CM | POA: Diagnosis not present

## 2024-02-08 DIAGNOSIS — R42 Dizziness and giddiness: Secondary | ICD-10-CM

## 2024-02-08 NOTE — Progress Notes (Signed)
 Patient ID: Joel Williams, male   DOB: [birth year], 73 y.o.   MRN: [id number]  Follow-up: Recurrent dizziness, right ear pain, right ear hearing loss  History of Present Illness Joel Williams is a 73 year old male with a stage IV right temporal lobe glioblastoma who presents for follow-up of ear infection and hearing loss.  His previous ear infection has resolved, with no current pain in the right ear. He experiences occasional dizziness, which is not significant at this time.  He began chemotherapy yesterday for his brain tumor, involving a regimen of taking pills for five nights followed by a 23-day break, continuing this cycle until March for a total of six months. He is scheduled for another MRI next month.  He was previously treated with radiation therapy.  Hearing in the left ear has worsened compared to a hearing test from 2017. He is concerned about the potential impact of the brain tumor on his hearing, particularly since the tumor is on the right side. He has hearing aids but does not currently use them, questioning their functionality.  He notes that his vision is not as clear as it used to be, which he initially thought was affecting his balance. He understands that balance is influenced by the inner ear, eyes, and body feedback.   Exam: General: Communicates without difficulty, well nourished, no acute distress. Head: Normocephalic, no evidence injury, no tenderness, facial buttresses intact without stepoff. Face/sinus: No tenderness to palpation and percussion. Facial movement is normal and symmetric. Eyes: PERRL, EOMI. No scleral icterus, conjunctivae clear. Neuro: CN II exam reveals vision grossly intact.  No nystagmus at any point of gaze. Ears: Auricles well formed without lesions.  Ear canals are intact without mass or lesion.  No erythema or edema is appreciated.  The TMs are intact without fluid. Nose: External evaluation reveals normal support and skin without  lesions.  Dorsum is intact.  Anterior rhinoscopy reveals congested mucosa over anterior aspect of inferior turbinates and intact septum.  No purulence noted. Oral:  Oral cavity and oropharynx are intact, symmetric, without erythema or edema.  Mucosa is moist without lesions. Neck: Full range of motion without pain.  There is no significant lymphadenopathy.  No masses palpable.  Thyroid  bed within normal limits to palpation.  Parotid glands and submandibular glands equal bilaterally without mass.  Trachea is midline. Neuro:  CN 2-12 grossly intact.    Assessment and Plan Assessment & Plan Progressive bilateral sensorineural hearing loss Progressive bilateral sensorineural hearing loss, with worsening left ear hearing compared to 2017. Likely related to his previous chemoradiation treatment. Discussed potential impact on quality of life and importance of hearing aids if hearing loss significantly interferes with daily activities. - Continue chemotherapy as planned. - Will recheck hearing after completion of therapy in March. - Consider use of hearing aids if hearing loss significantly interferes with quality of life.  Recurrent dizziness Intermittent dizziness, possibly related to treatment for his right temporal lobe glioblastoma.  Extensively discussed balance system components including inner ear, eyes, and body feedback.  - Continue to monitor dizziness and assess impact of chemotherapy on balance. - If his dizziness worsens, he may benefit from vestibular rehabilitation/balance exercises.  The option of PT referral is discussed with the patient.

## 2024-02-09 ENCOUNTER — Other Ambulatory Visit: Payer: Self-pay

## 2024-02-15 DIAGNOSIS — R269 Unspecified abnormalities of gait and mobility: Secondary | ICD-10-CM | POA: Diagnosis not present

## 2024-02-15 DIAGNOSIS — E119 Type 2 diabetes mellitus without complications: Secondary | ICD-10-CM | POA: Diagnosis not present

## 2024-02-15 DIAGNOSIS — M6281 Muscle weakness (generalized): Secondary | ICD-10-CM | POA: Diagnosis not present

## 2024-02-15 DIAGNOSIS — H8112 Benign paroxysmal vertigo, left ear: Secondary | ICD-10-CM | POA: Diagnosis not present

## 2024-02-15 DIAGNOSIS — Z794 Long term (current) use of insulin: Secondary | ICD-10-CM | POA: Diagnosis not present

## 2024-02-16 ENCOUNTER — Encounter: Payer: Self-pay | Admitting: Audiology

## 2024-02-16 ENCOUNTER — Ambulatory Visit: Payer: Self-pay

## 2024-02-16 NOTE — Telephone Encounter (Signed)
 FYI Only or Action Required?: FYI only for provider: appointment scheduled on 02/17/24.  Patient was last seen in primary care on 11/23/2023 by [name], MD.  Called Nurse Triage reporting Eye Pain.  Symptoms began several days ago.  Interventions attempted: Prescription medications: previous eye abx drop rx.  Symptoms are: gradually improving.  Triage Disposition: See Physician Within 24 Hours  Patient/caregiver understands and will follow disposition?: Yes   Copied from CRM [id number]. Topic: Clinical - Red Word Triage >> Feb 16, 2024  8:02 AM Eva FALCON wrote: Red Word that prompted transfer to Nurse Triage: Wife is on the line, states right upper lid of eye is swollen, was having trouble opening eye when first waking up, but finally was able to open. Thinks it might pink eye returning that he was treated for a while back. Reason for Disposition  Eye pain present > 24 hours  Answer Assessment - Initial Assessment Questions Additional info: Treated for conjunctivitis several months ago, had some drops left and using those with relief to discomfort.  Acute visit advised, scheduled with pcp on 02/17/24, added appointment to wait list.    1. ONSET: When did the pain start? (e.g., minutes, hours, days)     Two days 2. TIMING: Does the pain come and go, or has it been constant since it started? (e.g., constant, intermittent, fleeting)     constant 3. SEVERITY: How bad is the pain?  (Scale 1-10; mild, moderate or severe)     Mild-moderate 4. LOCATION: Where does it hurt?  (e.g., eyelid, eye, cheekbone)     Upper right lid  5. CAUSE: What do you think is causing the pain?     Unsure maybe conjunctivitis, known blocked ducts 6. VISION: Do you have blurred vision or changes in your vision?      Denies  7. EYE DISCHARGE: Is there any discharge (pus) from the eye(s)?  If Yes, ask: What color is it?      clear 8. FEVER: Do you have a fever? If Yes, ask: What  is it, how was it measured, and when did it start?      Denies  9. OTHER SYMPTOMS: Do you have any other symptoms? (e.g., headache, nasal discharge, facial rash)     Right upper eye lid with swelling 10. PREGNANCY: Is there any chance you are pregnant? When was your last menstrual period?  Protocols used: Eye Pain and Other Symptoms-A-AH

## 2024-02-17 ENCOUNTER — Ambulatory Visit (INDEPENDENT_AMBULATORY_CARE_PROVIDER_SITE_OTHER): Admitting: Student in an Organized Health Care Education/Training Program

## 2024-02-17 ENCOUNTER — Encounter: Payer: Self-pay | Admitting: Student in an Organized Health Care Education/Training Program

## 2024-02-17 VITALS — BP 125/69 | HR 82 | Temp 98.0°F | Resp 16 | Ht 69.0 in | Wt 223.2 lb

## 2024-02-17 DIAGNOSIS — H00011 Hordeolum externum right upper eyelid: Secondary | ICD-10-CM | POA: Diagnosis not present

## 2024-02-17 DIAGNOSIS — I10 Essential (primary) hypertension: Secondary | ICD-10-CM | POA: Diagnosis not present

## 2024-02-17 DIAGNOSIS — L989 Disorder of the skin and subcutaneous tissue, unspecified: Secondary | ICD-10-CM

## 2024-02-17 NOTE — Assessment & Plan Note (Signed)
 Chronic and stable.  Blood pressure is at goal.  Tolerating antihypertensives well, even through chemotherapy regimens.  Plan to continue with amlodipine  and irbesartan .  I reviewed the labs from November which were reassuring.

## 2024-02-17 NOTE — Assessment & Plan Note (Signed)
 Hordeolum presents with swelling and tenderness, without infection or vision changes.  I think he likely caused an erosion at the eye lashes from rubbing.  I recommended against rubbing in the future.  I think the stye itself is decompressed at this point.  Now it seems he has to recover from the trauma of rubbing.  It does not look to be cellulitis at this point, but he may be at risk in the future given recent immunosuppression.  Apply warm compresses for 5 minutes. Avoid rubbing the eye. Monitor for increased swelling or infection; if swelling develops in the coming days, will prescribe antibiotic.

## 2024-02-17 NOTE — Patient Instructions (Signed)
  VISIT SUMMARY: During your visit, we discussed the discharge and swelling in your right eye, as well as a spot on your scalp. We also reviewed your recent experiences with chemotherapy and your diabetes management.  YOUR PLAN: -HORDEOLUM OF RIGHT UPPER EYELID: A hordeolum, also known as a stye, is a painful, red bump on the eyelid caused by a blocked gland. You should apply warm compresses to your right upper eyelid for 5 minutes and avoid rubbing your eye. Monitor for any increased swelling or signs of infection, and we will administer antibiotics if necessary.  -BASAL CELL CARCINOMA OF SCALP: Basal cell carcinoma is a type of skin cancer that often appears as a small, shiny bump or nodule on the skin. We suspect you have this on your scalp and have referred you to dermatology for further evaluation and excision to prevent any complications.  INSTRUCTIONS: Please follow up with dermatology for the evaluation and excision of the suspected basal cell carcinoma on your scalp. Continue to monitor your right eye for any changes and apply warm compresses as instructed. If you notice any increased swelling or signs of infection, please contact us  immediately. Your MRI is scheduled for December 17th, and you have a follow-up appointment with Dr. Faythe in March.

## 2024-02-17 NOTE — Assessment & Plan Note (Signed)
 Lesion on his scalp present for over 2 weeks has the appearance of a small basal cell carcinoma on a sun exposed area.  I recommended consultation with dermatology for excision, consideration of Mohs.  It is a very small area at this point so may be able to excise without Mohs.

## 2024-02-17 NOTE — Progress Notes (Signed)
 Established Patient Office Visit  Patient ID: Joel Williams, male    DOB: [birth year]  Age: 73 y.o. MRN: [id number] PCP: [name], MD  Chief Complaint  Patient presents with   Eye Pain    Right eye lid pain, no trouble with vision, pt states having discharge and watery eyes, no redness    Subjective:     HPI  Discussed the use of AI scribe software for clinical note transcription with the patient, who gave verbal consent to proceed.  History of Present Illness Joel Williams is a 73 year old male undergoing chemotherapy who presents with right eye discharge and swelling.  He has been experiencing discharge from his right eye for about a week, with significant worsening noted on Saturday. The eye was swollen yesterday morning and is sore, especially when touching the eyelid. He suspects the symptoms may be related to his chemotherapy regimen, specifically the increased dosage of Temodar  to 400 mg. He previously experienced left-sided conjunctivitis in September, which was treated with Polytrim  drops, providing some relief. The current issue does not involve pink discoloration of the eye. No changes in vision are reported, although the eye tears up frequently, requiring him to wipe it often. He uses 3.0 magnifier glasses to read small print without discomfort. He has been working hard on his studies recently, which he does not believe has affected his vision.  He has been experiencing skin issues, including a breakout of zits since starting chemotherapy. He currently has a zit beneath his head and others on the side, which have cleared up. He denies any nausea or vomiting and reports his white blood cell count was 4.8 on his last blood work on November 20th. He mentions a spot on his head that feels like a clump and wonders if it might be drainage from an incision. He showers daily and washes his hair, but the spot remains. He does not have a  dermatologist.  Regarding his diabetes management, he recently saw Dr. Faythe, who adjusted his insulin  dosage due to frequent nighttime sugar loads. He is scheduled for an MRI on December 17th and has a follow-up appointment with Dr. Faythe in March. His blood pressure is stable and his mood is generally good, though he occasionally feels grumpy.  He reports feeling cold all the time, which is why he wears a coat. No new blurred vision, only tearing up. No nausea or vomiting.     Objective:     BP 125/69 (BP Location: Left Arm, Patient Position: Sitting, Cuff Size: Large)   Pulse 82   Temp 98 F (36.7 C) (Oral)   Resp 16   Ht 5' 9 (1.753 m)   Wt 223 lb 3.2 oz (101.2 kg)   SpO2 97%   BMI 32.96 kg/m   Physical Exam  Gen: Well-appearing man, moderate frailty. Eyes: Right eyelid has some minor swelling and a little bit of bruising, the eyelid has a small erosion at the eyelash, looks like a prior stye that is now decompressed, the sclera looks healthy, pupil is normal, no conjunctivitis, normal vision Skin: On his scalp there is a 3 mm brown stuck on plaque with some local invasion and recent bleeding that looks to be a basal cell carcinoma Neuro: Alert, conversational, full strength upper and lower extremities.  Moderately slow get up and go, normal gait Ext: Warm, no edema, normal joints      Assessment & Plan:   Problem List Items Addressed This  Visit       Medium    Essential hypertension (Chronic)   Chronic and stable.  Blood pressure is at goal.  Tolerating antihypertensives well, even through chemotherapy regimens.  Plan to continue with amlodipine  and irbesartan .  I reviewed the labs from November which were reassuring.        Unprioritized   Hordeolum externum of right upper eyelid - Primary   Hordeolum presents with swelling and tenderness, without infection or vision changes.  I think he likely caused an erosion at the eye lashes from rubbing.  I recommended against  rubbing in the future.  I think the stye itself is decompressed at this point.  Now it seems he has to recover from the trauma of rubbing.  It does not look to be cellulitis at this point, but he may be at risk in the future given recent immunosuppression.  Apply warm compresses for 5 minutes. Avoid rubbing the eye. Monitor for increased swelling or infection; if swelling develops in the coming days, will prescribe antibiotic.      Scalp lesion   Lesion on his scalp present for over 2 weeks has the appearance of a small basal cell carcinoma on a sun exposed area.  I recommended consultation with dermatology for excision, consideration of Mohs.  It is a very small area at this point so may be able to excise without Mohs.      Relevant Orders   Ambulatory referral to Dermatology    Cleatus Debby Specking, MD [facility] at [facility]

## 2024-02-18 ENCOUNTER — Other Ambulatory Visit: Payer: Self-pay

## 2024-02-18 DIAGNOSIS — H8112 Benign paroxysmal vertigo, left ear: Secondary | ICD-10-CM | POA: Diagnosis not present

## 2024-02-18 DIAGNOSIS — M6281 Muscle weakness (generalized): Secondary | ICD-10-CM | POA: Diagnosis not present

## 2024-02-18 DIAGNOSIS — R269 Unspecified abnormalities of gait and mobility: Secondary | ICD-10-CM | POA: Diagnosis not present

## 2024-02-22 ENCOUNTER — Ambulatory Visit: Admitting: Student in an Organized Health Care Education/Training Program

## 2024-02-25 DIAGNOSIS — H8112 Benign paroxysmal vertigo, left ear: Secondary | ICD-10-CM | POA: Diagnosis not present

## 2024-02-25 DIAGNOSIS — R269 Unspecified abnormalities of gait and mobility: Secondary | ICD-10-CM | POA: Diagnosis not present

## 2024-02-25 DIAGNOSIS — M6281 Muscle weakness (generalized): Secondary | ICD-10-CM | POA: Diagnosis not present

## 2024-03-01 ENCOUNTER — Inpatient Hospital Stay: Admission: RE | Admit: 2024-03-01 | Discharge: 2024-03-01 | Attending: Internal Medicine

## 2024-03-01 DIAGNOSIS — C719 Malignant neoplasm of brain, unspecified: Secondary | ICD-10-CM

## 2024-03-01 MED ORDER — GADOPICLENOL 0.5 MMOL/ML IV SOLN
10.0000 mL | Freq: Once | INTRAVENOUS | Status: AC | PRN
  Administered 2024-03-01: 11:00:00 10 mL via INTRAVENOUS

## 2024-03-07 ENCOUNTER — Other Ambulatory Visit (HOSPITAL_COMMUNITY): Payer: Self-pay

## 2024-03-07 ENCOUNTER — Inpatient Hospital Stay

## 2024-03-07 ENCOUNTER — Inpatient Hospital Stay: Attending: Radiation Oncology | Admitting: Internal Medicine

## 2024-03-07 ENCOUNTER — Other Ambulatory Visit: Payer: Self-pay

## 2024-03-07 VITALS — BP 121/51 | HR 65 | Temp 97.3°F | Resp 18 | Wt 215.1 lb

## 2024-03-07 DIAGNOSIS — C712 Malignant neoplasm of temporal lobe: Secondary | ICD-10-CM | POA: Diagnosis present

## 2024-03-07 DIAGNOSIS — Z79899 Other long term (current) drug therapy: Secondary | ICD-10-CM | POA: Diagnosis not present

## 2024-03-07 DIAGNOSIS — C719 Malignant neoplasm of brain, unspecified: Secondary | ICD-10-CM

## 2024-03-07 DIAGNOSIS — R5383 Other fatigue: Secondary | ICD-10-CM | POA: Insufficient documentation

## 2024-03-07 LAB — CBC WITH DIFFERENTIAL (CANCER CENTER ONLY)
Abs Immature Granulocytes: 0.01 K/uL (ref 0.00–0.07)
Basophils Absolute: 0.1 K/uL (ref 0.0–0.1)
Basophils Relative: 1 %
Eosinophils Absolute: 0.1 K/uL (ref 0.0–0.5)
Eosinophils Relative: 2 %
HCT: 38.9 % — ABNORMAL LOW (ref 39.0–52.0)
Hemoglobin: 13.3 g/dL (ref 13.0–17.0)
Immature Granulocytes: 0 %
Lymphocytes Relative: 24 %
Lymphs Abs: 1.4 K/uL (ref 0.7–4.0)
MCH: 27.6 pg (ref 26.0–34.0)
MCHC: 34.2 g/dL (ref 30.0–36.0)
MCV: 80.7 fL (ref 80.0–100.0)
Monocytes Absolute: 0.7 K/uL (ref 0.1–1.0)
Monocytes Relative: 12 %
Neutro Abs: 3.5 K/uL (ref 1.7–7.7)
Neutrophils Relative %: 61 %
Platelet Count: 169 K/uL (ref 150–400)
RBC: 4.82 MIL/uL (ref 4.22–5.81)
RDW: 13.4 % (ref 11.5–15.5)
WBC Count: 5.8 K/uL (ref 4.0–10.5)
nRBC: 0 % (ref 0.0–0.2)

## 2024-03-07 LAB — CMP (CANCER CENTER ONLY)
ALT: 14 U/L (ref 0–44)
AST: 19 U/L (ref 15–41)
Albumin: 4.3 g/dL (ref 3.5–5.0)
Alkaline Phosphatase: 70 U/L (ref 38–126)
Anion gap: 11 (ref 5–15)
BUN: 16 mg/dL (ref 8–23)
CO2: 29 mmol/L (ref 22–32)
Calcium: 9.4 mg/dL (ref 8.9–10.3)
Chloride: 101 mmol/L (ref 98–111)
Creatinine: 0.88 mg/dL (ref 0.61–1.24)
GFR, Estimated: >60 mL/min
Glucose, Bld: 163 mg/dL — ABNORMAL HIGH (ref 70–99)
Potassium: 4.4 mmol/L (ref 3.5–5.1)
Sodium: 140 mmol/L (ref 135–145)
Total Bilirubin: 0.9 mg/dL (ref 0.0–1.2)
Total Protein: 7.2 g/dL (ref 6.5–8.1)

## 2024-03-07 MED ORDER — TEMOZOLOMIDE 180 MG PO CAPS
180.0000 mg | ORAL_CAPSULE | Freq: Every day | ORAL | 0 refills | Status: AC
  Filled 2024-03-07: qty 5, 28d supply, fill #0

## 2024-03-07 MED ORDER — TEMOZOLOMIDE 140 MG PO CAPS
140.0000 mg | ORAL_CAPSULE | Freq: Every day | ORAL | 0 refills | Status: AC
  Filled 2024-03-07: qty 5, 28d supply, fill #0

## 2024-03-07 NOTE — Progress Notes (Signed)
 "  [facility] at [facility] [address]  [place], [place] [address] [phone]   Interval Evaluation  Date of Service: 03/07/2024 Patient Name: Joel Williams Patient MRN: [id number] Patient DOB: 03/29/1950 Provider: Arthea MARLA Manns, MD  Identifying Statement:  Joel Williams is a 73 y.o. male with right temporal glioblastoma    Oncologic History: Oncology History  Glioblastoma, IDH-wildtype (HCC)  09/17/2023 Initial Diagnosis   Glioblastoma, IDH-wildtype (HCC)   10/14/2023 - 12/07/2023 Radiation Therapy   Completes 6 weeks IMRT and concurrent Temodar  75mg /m2 Audry)     Biomarkers:  MGMT Unknown.  IDH 1/2 Unknown.  EGFR Unknown  TERT Unknown   Interval History: Joel Williams presents today for follow up, now having completed cycle #2 of 5-day Temodar .  He did not tolerate treatment as well this week; he was flattened for 2-3 days after dosing, with severe fatigue.  Now he is back at baseline energy level.  Doesn't otherwise report new or progressive changes today.  Headaches have not recurred.  He denies seizures, gait difficulty.  Fatigue is moderate overall.  H+P (10/07/23) Patient presented to neurologic attention last month with several weeks of confusion, fatigue, lethargy, visual complaints.  MRI brain was obtained which demonstrated a large enhancing mass in the right temporal lobe, consistent with primary brain tumor.  He underwent craniotomy, resection with Dr. Lanis on 09/16/23; path demonstrated glioblastoma.  Since surgery he has been doing well, walks on his own, independent with activities of daily living.  He has been having daily headaches, dosing Tylenol , Fioricet  and Topamax  25mg  twice per day for prevention.  Medications: Current Outpatient Medications on File Prior to Visit  Medication Sig Dispense Refill   amLODipine  (NORVASC ) 10 MG tablet TAKE 1 TABLET DAILY 90 tablet 2   aspirin  EC 81 MG tablet Take 1 tablet  (81 mg total) by mouth daily. Swallow whole.     Continuous Glucose Sensor (FREESTYLE LIBRE 3 PLUS SENSOR) MISC apply to upper back of arm for 90 days change sensor every 15 days IC-10 CODE: E11.9     insulin  aspart (NOVOLOG ) 100 UNIT/ML injection Inject 0-20 Units into the skin 4 (four) times daily -  before meals and at bedtime. CBG 70 - 120: 0 units CBG 121 - 150: 3 units CBG 151 - 200: 4 units CBG 201 - 250: 7 units CBG 251 - 300: 11 units CBG 301 - 350: 15 units CBG 351 - 400: 20 units CBG > 400: call MD and obtain STAT lab verification     insulin  aspart (NOVOLOG ) 100 UNIT/ML injection Inject 5 Units into the skin with breakfast, with lunch, and with evening meal.     irbesartan  (AVAPRO ) 150 MG tablet Take 1 tablet (150 mg total) by mouth daily. 90 tablet 3   LORazepam  (ATIVAN ) 1 MG tablet Take 1 tablet (1 mg total) by mouth once as needed for up to 1 dose for anxiety (MRI claustrophobia). 3 tablet 0   metFORMIN  (GLUCOPHAGE -XR) 500 MG 24 hr tablet Take 2 tablets (1,000 mg total) by mouth 2 (two) times daily. 120 tablet 0   metoprolol  succinate (TOPROL -XL) 50 MG 24 hr tablet Take 1 tablet (50 mg total) by mouth daily. Take with or immediately following a meal. 90 tablet 2   ondansetron  (ZOFRAN ) 8 MG tablet Take 1 tablet (8 mg total) by mouth every 8 (eight) hours as needed for nausea or vomiting. May take 30-60 minutes prior to Temodar  administration if nausea/vomiting  occurs as needed. 30 tablet 1   ondansetron  (ZOFRAN ) 8 MG tablet Take 1 tablet (8 mg total) by mouth every 8 (eight) hours as needed for nausea or vomiting. May take 30-60 minutes prior to Temodar  administration if nausea/vomiting occurs as needed. 30 tablet 1   pantoprazole  (PROTONIX ) 40 MG tablet Take 1 tablet (40 mg total) by mouth at bedtime. 90 tablet 3   simvastatin  (ZOCOR ) 20 MG tablet TAKE 1 TABLET DAILY 90 tablet 2   temozolomide  (TEMODAR ) 100 MG capsule Take 4 capsules (400 mg total) by mouth daily. Take for 5 days on, 23  days off. Repeat every 28 days. May take on an empty stomach to decrease nausea & vomiting. 20 capsule 0   No current facility-administered medications on file prior to visit.    Allergies:  Allergies  Allergen Reactions   Invokana [Canagliflozin]     Yeast infections   Trulicity [Dulaglutide] Other (See Comments)    yeast infections   Past Medical History:  Past Medical History:  Diagnosis Date   Allergic rhinitis    Arthritis    ASTHMA 06/08/2008   Asthma    as a child   CAD (coronary artery disease)    a. s/p Promus DES (3.0 x 16 mm) to mid LAD 05/16/10;  b. Myoview  05/15/10: anteroseptal ischemia with EF 52%;   c. Cath 05/16/10: single vessel CAD with mLAD 90% tx with PCI and EF 50%;  d. 07/2011 Cath:  Patent stent, nonobs dzs, NL EF.   Chronic kidney disease    was seen by Dr. Lonna and released.   Colonic polyp    DIABETES MELLITUS, TYPE II 06/08/2008   GERD (gastroesophageal reflux disease)    Hemorrhoids    HYPERLIPIDEMIA 06/08/2008   HYPERTENSION 06/08/2008   Impotence of organic origin 08/24/2008   Myocardial infarction (HCC) 04/16/2010   Pneumonia    hx of   Renal vein thrombosis (HCC)    Seasonal allergies    Status post craniotomy 09/14/2023   Past Surgical History:  Past Surgical History:  Procedure Laterality Date   APPLICATION OF CRANIAL NAVIGATION Right 09/14/2023   Procedure: COMPUTER-ASSISTED NAVIGATION, FOR CRANIAL PROCEDURE;  Surgeon: Lanis Pupa, MD;  Location: [facility] OR;  Service: Neurosurgery;  Laterality: Right;   BACK SURGERY  2011   CATARACT EXTRACTION Bilateral    2024   CORONARY ANGIOPLASTY WITH STENT PLACEMENT     CRANIOTOMY Right 09/14/2023   Procedure: STEREOTACTIC RIGHT TEMPRO-OCCIPITAL CRANIOTOMY FOR RESECTION OF TUMOR;  Surgeon: Lanis Pupa, MD;  Location: [facility] OR;  Service: Neurosurgery;  Laterality: Right;   LEFT HEART CATHETERIZATION WITH CORONARY ANGIOGRAM N/A 07/29/2011   Procedure: LEFT HEART CATHETERIZATION WITH CORONARY  ANGIOGRAM;  Surgeon: Lonni JONETTA Cash, MD;  Location[facility] CATH LAB;  Service: Cardiovascular;  Laterality: N/A;   LEFT HEART CATHETERIZATION WITH CORONARY ANGIOGRAM N/A 06/14/2013   Procedure: LEFT HEART CATHETERIZATION WITH CORONARY ANGIOGRAM;  Surgeon: Lonni JONETTA Cash, MD;  Location: [facility];  Service: Cardiovascular;  Laterality: N/A;   RADIOLOGY WITH ANESTHESIA N/A 06/09/2022   Procedure: MRI WITH ANESTHESIA OF LUMBAR SPINE WITHOUT CONTRAST;  Surgeon: Radiologist, Medication, MD;  Location: [facility] OR;  Service: Radiology;  Laterality: N/A;   Social History:  Social History   Socioeconomic History   Marital status: Married    Spouse name: Not on file   Number of children: 2   Years of education: Not on file   Highest education level: Not on file  Occupational History   Occupation:  Sales  Tobacco Use   Smoking status: Former    Current packs/day: 0.00    Average packs/day: 2.0 packs/day for 53.0 years (106.0 ttl pk-yrs)    Types: Cigarettes    Start date: 05/22/1950    Quit date: 05/23/2002    Years since quitting: 21.8   Smokeless tobacco: Never  Vaping Use   Vaping status: Never Used  Substance and Sexual Activity   Alcohol use: Not Currently    Comment: Ocassional since starting Ozempic - fall 2023.   Drug use: No   Sexual activity: Not Currently  Other Topics Concern   Not on file  Social History Narrative   Not on file   Social Drivers of Health   Tobacco Use: Medium Risk (02/17/2024)   Patient History    Smoking Tobacco Use: Former    Smokeless Tobacco Use: Never    Passive Exposure: Not on file  Financial Resource Strain: Low Risk (12/25/2022)   Overall Financial Resource Strain (CARDIA)    Difficulty of Paying Living Expenses: Not hard at all  Food Insecurity: No Food Insecurity (10/07/2023)   Epic    Worried About Radiation Protection Practitioner of Food in the Last Year: Never true    Ran Out of Food in the Last Year: Never true  Transportation Needs: No Transportation  Needs (10/07/2023)   Epic    Lack of Transportation (Medical): No    Lack of Transportation (Non-Medical): No  Physical Activity: Insufficiently Active (12/25/2022)   Exercise Vital Sign    Days of Exercise per Week: 2 days    Minutes of Exercise per Session: 30 min  Stress: No Stress Concern Present (12/25/2022)   [employer] of Occupational Health - Occupational Stress Questionnaire    Feeling of Stress : Not at all  Social Connections: Socially Integrated (09/14/2023)   Social Connection and Isolation Panel    Frequency of Communication with Friends and Family: More than three times a week    Frequency of Social Gatherings with Friends and Family: More than three times a week    Attends Religious Services: More than 4 times per year    Active Member of [employer] or Organizations: Yes    Attends Banker Meetings: More than 4 times per year    Marital Status: Married  Catering Manager Violence: Not At Risk (10/07/2023)   Epic    Fear of Current or Ex-Partner: No    Emotionally Abused: No    Physically Abused: No    Sexually Abused: No  Depression (PHQ2-9): Low Risk (02/03/2024)   Depression (PHQ2-9)    PHQ-2 Score: 0  Alcohol Screen: Low Risk (12/22/2021)   Alcohol Screen    Last Alcohol Screening Score (AUDIT): 3  Housing: Low Risk (10/07/2023)   Epic    Unable to Pay for Housing in the Last Year: No    Number of Times Moved in the Last Year: 0    Homeless in the Last Year: No  Utilities: Not At Risk (10/07/2023)   Epic    Threatened with loss of utilities: No  Health Literacy: Adequate Health Literacy (12/25/2022)   B1300 Health Literacy    Frequency of need for help with medical instructions: Never   Family History:  Family History  Problem Relation Age of Onset   Diabetes Father    Heart disease Father    Angina Father    Dementia Mother     Review of Systems: Constitutional: Doesn't report fevers, chills or abnormal weight loss Eyes:  Doesn't report  blurriness of vision Ears, nose, mouth, throat, and face: Doesn't report sore throat Respiratory: Doesn't report cough, dyspnea or wheezes Cardiovascular: Doesn't report palpitation, chest discomfort  Gastrointestinal:  Doesn't report nausea, constipation, diarrhea GU: Doesn't report incontinence Skin: Doesn't report skin rashes Neurological: Per HPI Musculoskeletal: Doesn't report joint pain Behavioral/Psych: Doesn't report anxiety  Physical Exam: Vitals:   03/07/24 1147  BP: (!) 121/51  Pulse: 65  Resp: 18  Temp: (!) 97.3 F (36.3 C)  SpO2: 95%     KPS: 90. General: Alert, cooperative, pleasant, in no acute distress Head: Normal EENT: No conjunctival injection or scleral icterus.  Lungs: Resp effort normal Cardiac: Regular rate Abdomen: Non-distended abdomen Skin: No rashes cyanosis or petechiae. Extremities: No clubbing or edema  Neurologic Exam: Mental Status: Awake, alert, attentive to examiner. Oriented to self and environment. Language is fluent with intact comprehension.  Cranial Nerves: Visual acuity is grossly normal. Visual fields are full. Extra-ocular movements intact. No ptosis. Face is symmetric Motor: Tone and bulk are normal. Power is full in both arms and legs. Reflexes are symmetric, no pathologic reflexes present.  Sensory: Intact to light touch Gait: Normal.   Labs: I have reviewed the data as listed    Component Value Date/Time   NA 140 02/03/2024 1130   NA 137 07/16/2017 0000   K 3.5 02/03/2024 1130   CL 101 02/03/2024 1130   CO2 28 02/03/2024 1130   GLUCOSE 186 (H) 02/03/2024 1130   BUN 11 02/03/2024 1130   BUN 16 07/16/2017 0000   CREATININE 0.66 02/03/2024 1130   CREATININE 0.72 02/24/2016 0806   CALCIUM 9.1 02/03/2024 1130   PROT 6.6 02/03/2024 1130   PROT 6.8 08/19/2023 0940   ALBUMIN 4.1 02/03/2024 1130   AST 18 02/03/2024 1130   ALT 17 02/03/2024 1130   ALKPHOS 71 02/03/2024 1130   BILITOT 0.7 02/03/2024 1130   GFRNONAA >60  02/03/2024 1130   GFRAA >90 07/29/2011 1120   Lab Results  Component Value Date   WBC 4.8 02/03/2024   NEUTROABS 3.0 02/03/2024   HGB 12.6 (L) 02/03/2024   HCT 37.1 (L) 02/03/2024   MCV 81.7 02/03/2024   PLT 153 02/03/2024    Imaging:  [facility] Clinician Interpretation: I have personally reviewed the CNS images as listed.  My interpretation, in the context of the patient's clinical presentation, is stable disease  MR BRAIN W WO CONTRAST Result Date: 03/01/2024 EXAM: MRI BRAIN WITH AND WITHOUT CONTRAST 03/01/2024 10:57:29 AM TECHNIQUE: Multiplanar multisequence MRI of the head/brain was performed with and without the administration of intravenous contrast. COMPARISON: MRI head 12/30/2023. CLINICAL HISTORY: Brain/CNS neoplasm, assess treatment response. Glioblastoma. FINDINGS: BRAIN AND VENTRICLES: Postoperative changes are again noted with similar appearance of a resection cavity in the posterior right temporal lobe containing a small amount of chronic blood products. Curvilinear enhancement along the margins of the resection cavity as well as some intrinsic T1 hyperintensity have not significantly changed. No mass-like enhancement is present. Mild T2 hyperintensity surrounding the cavity, most notably posteriorly, is unchanged, and there is no significant mass effect. There is ex vacuo dilatation of the right lateral ventricle. Mild dural thickening subjacent to the craniotomy is unchanged. No acute infarct, midline shift, hydrocephalus, or significant extra-axial fluid collection is evident. Scattered small T2 hyperintensities elsewhere in the cerebral white matter and pons are unchanged and nonspecific but compatible with mild chronic small vessel ischemic disease. There is mild cerebral atrophy. Major intracranial vascular flow voids are preserved.  ORBITS: Bilateral cataract extraction. SINUSES: Mild mucosal thickening in the paranasal sinuses. Small right mastoid effusion. BONES AND SOFT TISSUES:  Right sided craniotomy. No suspicious marrow lesion. IMPRESSION: 1. Stable postoperative changes in the posterior right temporal lobe. No evidence of progressive disease. Electronically signed by: Dasie Hamburg MD 03/01/2024 11:47 AM EST RP Workstation: [id number]    Assessment/Plan Glioblastoma, IDH-wildtype (HCC)  Joel Williams is clinically stable today, now having completed cycle #2 of adjuvant 5-day Temodar .  MRI brain demonstrates stable findings.  Labs are still pending for today.  We recommended continuing treatment with cycle #3 Temozolomide  dose reduced to 150mg /m2, on for five days and off for twenty three days in twenty eight day cycles. The patient will have a complete blood count performed on days 21 and 28 of each cycle, and a comprehensive metabolic panel performed on day 28 of each cycle. Labs may need to be performed more often. Zofran  will prescribed for home use for nausea/vomiting.   Informed consent was obtained verbally at bedside to proceed with oral chemotherapy.  Chemotherapy should be held for the following:  ANC less than 1,000  Platelets less than 100,000  LFT or creatinine greater than 2x ULN  If clinical concerns/contraindications develop  We ask that Joel Williams return to clinic in 1 months with labs for evaluation prior to cycle #3, or sooner as needed.  All questions were answered. The patient knows to call the clinic with any problems, questions or concerns. No barriers to learning were detected.  The total time spent in the encounter was 40 minutes and more than 50% was on counseling and review of test results   Arthea MARLA Manns, MD Medical Director of Neuro-Oncology [facility] at [facility] 03/07/2024 11:59 AM "

## 2024-03-07 NOTE — Progress Notes (Signed)
 Specialty Pharmacy Refill Coordination Note  Joel Williams is a 73 y.o. male contacted today regarding refills of specialty medication(s) Temozolomide  (TEMODAR )   Patient requested Delivery   Delivery date: 03/08/24   Verified address: [address]   [place] [address]   Medication will be filled on: 03/07/24

## 2024-03-08 ENCOUNTER — Telehealth: Payer: Self-pay | Admitting: Internal Medicine

## 2024-03-08 NOTE — Telephone Encounter (Signed)
 Scheduled patient for next appointment. Called and spoke with the patients wife, she is aware. They will check mychart as well.

## 2024-03-13 ENCOUNTER — Other Ambulatory Visit: Payer: Self-pay | Admitting: Nutrition

## 2024-03-13 DIAGNOSIS — C719 Malignant neoplasm of brain, unspecified: Secondary | ICD-10-CM

## 2024-03-21 DIAGNOSIS — K219 Gastro-esophageal reflux disease without esophagitis: Secondary | ICD-10-CM

## 2024-03-22 MED ORDER — IRBESARTAN 150 MG PO TABS: 150.0000 mg | ORAL_TABLET | Freq: Every day | ORAL | 3 refills | Status: AC

## 2024-03-22 MED ORDER — METOPROLOL SUCCINATE ER 50 MG PO TB24: 50.0000 mg | ORAL_TABLET | Freq: Every day | ORAL | 3 refills | Status: AC

## 2024-03-22 MED ORDER — METFORMIN HCL ER 500 MG PO TB24: 1000.0000 mg | ORAL_TABLET | Freq: Two times a day (BID) | ORAL | 3 refills | Status: AC

## 2024-03-22 MED ORDER — AMLODIPINE BESYLATE 10 MG PO TABS: 10.0000 mg | ORAL_TABLET | Freq: Every day | ORAL | 3 refills | Status: AC

## 2024-03-22 MED ORDER — SIMVASTATIN 20 MG PO TABS: 20.0000 mg | ORAL_TABLET | Freq: Every day | ORAL | 3 refills | Status: AC

## 2024-03-22 MED ORDER — PANTOPRAZOLE SODIUM 40 MG PO TBEC: 40.0000 mg | DELAYED_RELEASE_TABLET | Freq: Every day | ORAL | 3 refills | Status: AC

## 2024-03-28 ENCOUNTER — Other Ambulatory Visit: Payer: Self-pay

## 2024-04-11 ENCOUNTER — Other Ambulatory Visit: Payer: Self-pay

## 2024-04-11 ENCOUNTER — Inpatient Hospital Stay: Attending: Radiation Oncology

## 2024-04-11 ENCOUNTER — Inpatient Hospital Stay: Admitting: Internal Medicine

## 2024-04-11 ENCOUNTER — Other Ambulatory Visit (HOSPITAL_COMMUNITY): Payer: Self-pay

## 2024-04-11 VITALS — BP 119/55 | HR 62 | Temp 97.7°F | Resp 20 | Wt 211.1 lb

## 2024-04-11 DIAGNOSIS — C712 Malignant neoplasm of temporal lobe: Secondary | ICD-10-CM | POA: Diagnosis not present

## 2024-04-11 DIAGNOSIS — C719 Malignant neoplasm of brain, unspecified: Secondary | ICD-10-CM

## 2024-04-11 LAB — CMP (CANCER CENTER ONLY)
ALT: 12 U/L (ref 0–44)
AST: 14 U/L — ABNORMAL LOW (ref 15–41)
Albumin: 4.1 g/dL (ref 3.5–5.0)
Alkaline Phosphatase: 85 U/L (ref 38–126)
Anion gap: 13 (ref 5–15)
BUN: 14 mg/dL (ref 8–23)
CO2: 27 mmol/L (ref 22–32)
Calcium: 9.1 mg/dL (ref 8.9–10.3)
Chloride: 101 mmol/L (ref 98–111)
Creatinine: 0.84 mg/dL (ref 0.61–1.24)
GFR, Estimated: >60 mL/min
Glucose, Bld: 201 mg/dL — ABNORMAL HIGH (ref 70–99)
Potassium: 3.7 mmol/L (ref 3.5–5.1)
Sodium: 141 mmol/L (ref 135–145)
Total Bilirubin: 0.7 mg/dL (ref 0.0–1.2)
Total Protein: 7.1 g/dL (ref 6.5–8.1)

## 2024-04-11 LAB — CBC WITH DIFFERENTIAL (CANCER CENTER ONLY)
Abs Immature Granulocytes: 0.01 10*3/uL (ref 0.00–0.07)
Basophils Absolute: 0.1 10*3/uL (ref 0.0–0.1)
Basophils Relative: 1 %
Eosinophils Absolute: 0.1 10*3/uL (ref 0.0–0.5)
Eosinophils Relative: 3 %
HCT: 37.1 % — ABNORMAL LOW (ref 39.0–52.0)
Hemoglobin: 12.8 g/dL — ABNORMAL LOW (ref 13.0–17.0)
Immature Granulocytes: 0 %
Lymphocytes Relative: 21 %
Lymphs Abs: 1 10*3/uL (ref 0.7–4.0)
MCH: 27.8 pg (ref 26.0–34.0)
MCHC: 34.5 g/dL (ref 30.0–36.0)
MCV: 80.5 fL (ref 80.0–100.0)
Monocytes Absolute: 0.7 10*3/uL (ref 0.1–1.0)
Monocytes Relative: 14 %
Neutro Abs: 2.9 10*3/uL (ref 1.7–7.7)
Neutrophils Relative %: 61 %
Platelet Count: 176 10*3/uL (ref 150–400)
RBC: 4.61 MIL/uL (ref 4.22–5.81)
RDW: 13.7 % (ref 11.5–15.5)
WBC Count: 4.7 10*3/uL (ref 4.0–10.5)
nRBC: 0 % (ref 0.0–0.2)

## 2024-04-11 MED ORDER — TEMOZOLOMIDE 140 MG PO CAPS
140.0000 mg | ORAL_CAPSULE | Freq: Every day | ORAL | 0 refills | Status: AC
  Filled 2024-04-11: qty 5, 28d supply, fill #0

## 2024-04-11 MED ORDER — DEXAMETHASONE 4 MG PO TABS: 4.0000 mg | ORAL_TABLET | Freq: Every day | ORAL | 0 refills | Status: AC

## 2024-04-11 MED ORDER — TEMOZOLOMIDE 180 MG PO CAPS
180.0000 mg | ORAL_CAPSULE | Freq: Every day | ORAL | 0 refills | Status: AC
  Filled 2024-04-11: qty 5, 28d supply, fill #0

## 2024-04-11 NOTE — Progress Notes (Signed)
 Specialty Pharmacy Refill Coordination Note  Joel Williams is a 74 y.o. male contacted today regarding refills of specialty medication(s) Temozolomide  (TEMODAR )   Patient requested Delivery   Delivery date: 04/12/24   Verified address: [address]   [place] [address]   Medication will be filled on: 04/11/24

## 2024-04-11 NOTE — Progress Notes (Signed)
 Specialty Pharmacy Ongoing Clinical Assessment Note  Joel Williams is a 74 y.o. male who is being followed by the specialty pharmacy service for RxSp Oncology   Patient's specialty medication(s) reviewed today: Temozolomide  (TEMODAR )   Missed doses in the last 4 weeks: 0   Patient/Caregiver did not have any additional questions or concerns.   Therapeutic benefit summary: Patient is achieving benefit   Adverse events/side effects summary: Experienced adverse events/side effects (ongoing, tolerable fatigue)   Patient's therapy is appropriate to: Continue    Goals Addressed             This Visit's Progress    Slow Disease Progression   On track    Patient is on track. Patient will maintain adherence.  Per Dr. Eward office visit note from 04/11/24, the patient is clinically stable at this time.         Follow up: 3 months  Joel Williams Specialty Pharmacist  Clinical Intervention Note  Clinical Intervention Notes: Patient started Decadron , no DDIs were identified with his Temodar .   Clinical Intervention Outcomes: Prevention of an adverse drug event   Joel Williams Joel Williams

## 2024-04-11 NOTE — Progress Notes (Signed)
 "  [facility] [address]  [place], [place] [address] [phone]   Interval Evaluation  Date of Service: 04/11/24 Patient Name: Joel Williams Patient MRN: [id number] Patient DOB: Oct 06, 1950 Provider: Arthea MARLA Manns, MD  Identifying Statement:  MAKEL MCMANN is a 74 y.o. male with right temporal glioblastoma    Oncologic History: Oncology History  Glioblastoma, IDH-wildtype (HCC)  09/17/2023 Initial Diagnosis   Glioblastoma, IDH-wildtype (HCC)   10/14/2023 - 12/07/2023 Radiation Therapy   Completes 6 weeks IMRT and concurrent Temodar  75mg /m2 Audry)     Biomarkers:  MGMT Unknown.  IDH 1/2 Unknown.  EGFR Unknown  TERT Unknown   Interval History: Joel Williams presents today for follow up, now having completed cycle #3 of 5-day Temodar .  He did better this month with the reduced dosage.  Now he is back at baseline energy level.  Doesn't otherwise report new or progressive changes today.  Headaches have not recurred.  He denies seizures, gait difficulty.  Fatigue is moderate overall.  H+P (10/07/23) Patient presented to neurologic attention last month with several weeks of confusion, fatigue, lethargy, visual complaints.  MRI brain was obtained which demonstrated a large enhancing mass in the right temporal lobe, consistent with primary brain tumor.  He underwent craniotomy, resection with Dr. Lanis on 09/16/23; path demonstrated glioblastoma.  Since surgery he has been doing well, walks on his own, independent with activities of daily living.  He has been having daily headaches, dosing Tylenol , Fioricet  and Topamax  25mg  twice per day for prevention.  Medications: Current Outpatient Medications on File Prior to Visit  Medication Sig Dispense Refill   amLODipine  (NORVASC ) 10 MG tablet Take 1 tablet (10 mg total) by mouth daily. 90 tablet 3   aspirin  EC 81 MG tablet Take 1 tablet (81 mg total) by mouth daily. Swallow whole.      Continuous Glucose Sensor (FREESTYLE LIBRE 3 PLUS SENSOR) MISC apply to upper back of arm for 90 days change sensor every 15 days IC-10 CODE: E11.9     insulin  aspart (NOVOLOG ) 100 UNIT/ML injection Inject 0-20 Units into the skin 4 (four) times daily -  before meals and at bedtime. CBG 70 - 120: 0 units CBG 121 - 150: 3 units CBG 151 - 200: 4 units CBG 201 - 250: 7 units CBG 251 - 300: 11 units CBG 301 - 350: 15 units CBG 351 - 400: 20 units CBG > 400: call MD and obtain STAT lab verification     insulin  aspart (NOVOLOG ) 100 UNIT/ML injection Inject 5 Units into the skin with breakfast, with lunch, and with evening meal.     irbesartan  (AVAPRO ) 150 MG tablet Take 1 tablet (150 mg total) by mouth daily. 90 tablet 3   LORazepam  (ATIVAN ) 1 MG tablet Take 1 tablet (1 mg total) by mouth once as needed for up to 1 dose for anxiety (MRI claustrophobia). 3 tablet 0   metFORMIN  (GLUCOPHAGE -XR) 500 MG 24 hr tablet Take 2 tablets (1,000 mg total) by mouth 2 (two) times daily. 120 tablet 3   metoprolol  succinate (TOPROL -XL) 50 MG 24 hr tablet Take 1 tablet (50 mg total) by mouth daily. Take with or immediately following a meal. 90 tablet 3   ondansetron  (ZOFRAN ) 8 MG tablet Take 1 tablet (8 mg total) by mouth every 8 (eight) hours as needed for nausea or vomiting. May take 30-60 minutes prior to Temodar  administration if nausea/vomiting occurs as needed. 30 tablet 1  ondansetron  (ZOFRAN ) 8 MG tablet Take 1 tablet (8 mg total) by mouth every 8 (eight) hours as needed for nausea or vomiting. May take 30-60 minutes prior to Temodar  administration if nausea/vomiting occurs as needed. 30 tablet 1   pantoprazole  (PROTONIX ) 40 MG tablet Take 1 tablet (40 mg total) by mouth at bedtime. 90 tablet 3   simvastatin  (ZOCOR ) 20 MG tablet Take 1 tablet (20 mg total) by mouth daily. 90 tablet 3   temozolomide  (TEMODAR ) 100 MG capsule Take 4 capsules (400 mg total) by mouth daily. Take for 5 days on, 23 days off. Repeat every  28 days. May take on an empty stomach to decrease nausea & vomiting. 20 capsule 0   temozolomide  (TEMODAR ) 140 MG capsule Take 1 capsule (140 mg total) by mouth daily. (Take with ONE 180 mg capsule for total daily dose of 320 mg). Take for 5 days on, 23 days off. Repeat every 28 days. May take on an empty stomach to decrease nausea & vomiting. 5 capsule 0   temozolomide  (TEMODAR ) 180 MG capsule Take 1 capsule (180 mg total) by mouth daily. (Take with ONE 140 mg capsule for total daily dose of 320 mg). Take for 5 days on, 23 days off. Repeat every 28 days. May take on an empty stomach to decrease nausea & vomiting. 5 capsule 0   No current facility-administered medications on file prior to visit.    Allergies:  Allergies  Allergen Reactions   Invokana [Canagliflozin]     Yeast infections   Trulicity [Dulaglutide] Other (See Comments)    yeast infections   Past Medical History:  Past Medical History:  Diagnosis Date   Allergic rhinitis    Arthritis    ASTHMA 06/08/2008   Asthma    as a child   CAD (coronary artery disease)    a. s/p Promus DES (3.0 x 16 mm) to mid LAD 05/16/10;  b. Myoview  05/15/10: anteroseptal ischemia with EF 52%;   c. Cath 05/16/10: single vessel CAD with mLAD 90% tx with PCI and EF 50%;  d. 07/2011 Cath:  Patent stent, nonobs dzs, NL EF.   Chronic kidney disease    was seen by Dr. Lonna and released.   Colonic polyp    DIABETES MELLITUS, TYPE II 06/08/2008   GERD (gastroesophageal reflux disease)    Hemorrhoids    HYPERLIPIDEMIA 06/08/2008   HYPERTENSION 06/08/2008   Impotence of organic origin 08/24/2008   Myocardial infarction (HCC) 04/16/2010   Pneumonia    hx of   Renal vein thrombosis (HCC)    Seasonal allergies    Status post craniotomy 09/14/2023   Past Surgical History:  Past Surgical History:  Procedure Laterality Date   APPLICATION OF CRANIAL NAVIGATION Right 09/14/2023   Procedure: COMPUTER-ASSISTED NAVIGATION, FOR CRANIAL PROCEDURE;  Surgeon:  Lanis Pupa, MD;  Location: [facility] OR;  Service: Neurosurgery;  Laterality: Right;   BACK SURGERY  2011   CATARACT EXTRACTION Bilateral    2024   CORONARY ANGIOPLASTY WITH STENT PLACEMENT     CRANIOTOMY Right 09/14/2023   Procedure: STEREOTACTIC RIGHT TEMPRO-OCCIPITAL CRANIOTOMY FOR RESECTION OF TUMOR;  Surgeon: Lanis Pupa, MD;  Location: [facility] OR;  Service: Neurosurgery;  Laterality: Right;   LEFT HEART CATHETERIZATION WITH CORONARY ANGIOGRAM N/A 07/29/2011   Procedure: LEFT HEART CATHETERIZATION WITH CORONARY ANGIOGRAM;  Surgeon: Lonni JONETTA Cash, MD;  Location: [facility] CATH LAB;  Service: Cardiovascular;  Laterality: N/A;   LEFT HEART CATHETERIZATION WITH CORONARY ANGIOGRAM N/A 06/14/2013   Procedure: LEFT  HEART CATHETERIZATION WITH CORONARY ANGIOGRAM;  Surgeon: Lonni JONETTA Cash, MD;  Location: [facility] CATH LAB;  Service: Cardiovascular;  Laterality: N/A;   RADIOLOGY WITH ANESTHESIA N/A 06/09/2022   Procedure: MRI WITH ANESTHESIA OF LUMBAR SPINE WITHOUT CONTRAST;  Surgeon: Radiologist, Medication, MD;  Location: [facility] OR;  Service: Radiology;  Laterality: N/A;   Social History:  Social History   Socioeconomic History   Marital status: Married    Spouse name: Not on file   Number of children: 2   Years of education: Not on file   Highest education level: Not on file  Occupational History   Occupation: Sales  Tobacco Use   Smoking status: Former    Current packs/day: 0.00    Average packs/day: 2.0 packs/day for 53.0 years (106.0 ttl pk-yrs)    Types: Cigarettes    Start date: 05/22/1949    Quit date: 05/23/2002    Years since quitting: 21.9   Smokeless tobacco: Never  Vaping Use   Vaping status: Never Used  Substance and Sexual Activity   Alcohol use: Not Currently    Comment: Ocassional since starting Ozempic - fall 2023.   Drug use: No   Sexual activity: Not Currently  Other Topics Concern   Not on file  Social History Narrative   Not on file   Social Drivers of  Health   Tobacco Use: Medium Risk (02/17/2024)   Patient History    Smoking Tobacco Use: Former    Smokeless Tobacco Use: Never    Passive Exposure: Not on file  Financial Resource Strain: Low Risk (12/25/2022)   Overall Financial Resource Strain (CARDIA)    Difficulty of Paying Living Expenses: Not hard at all  Food Insecurity: No Food Insecurity (10/07/2023)   Epic    Worried About Radiation Protection Practitioner of Food in the Last Year: Never true    Ran Out of Food in the Last Year: Never true  Transportation Needs: No Transportation Needs (10/07/2023)   Epic    Lack of Transportation (Medical): No    Lack of Transportation (Non-Medical): No  Physical Activity: Insufficiently Active (12/25/2022)   Exercise Vital Sign    Days of Exercise per Week: 2 days    Minutes of Exercise per Session: 30 min  Stress: No Stress Concern Present (12/25/2022)   [employer] of Occupational Health - Occupational Stress Questionnaire    Feeling of Stress : Not at all  Social Connections: Socially Integrated (09/14/2023)   Social Connection and Isolation Panel    Frequency of Communication with Friends and Family: More than three times a week    Frequency of Social Gatherings with Friends and Family: More than three times a week    Attends Religious Services: More than 4 times per year    Active Member of Clubs or Organizations: Yes    Attends Banker Meetings: More than 4 times per year    Marital Status: Married  Catering Manager Violence: Not At Risk (10/07/2023)   Epic    Fear of Current or Ex-Partner: No    Emotionally Abused: No    Physically Abused: No    Sexually Abused: No  Depression (PHQ2-9): Low Risk (03/07/2024)   Depression (PHQ2-9)    PHQ-2 Score: 0  Alcohol Screen: Low Risk (12/22/2021)   Alcohol Screen    Last Alcohol Screening Score (AUDIT): 3  Housing: Low Risk (10/07/2023)   Epic    Unable to Pay for Housing in the Last Year: No    Number of Times  Moved in the Last Year: 0     Homeless in the Last Year: No  Utilities: Not At Risk (10/07/2023)   Epic    Threatened with loss of utilities: No  Health Literacy: Adequate Health Literacy (12/25/2022)   B1300 Health Literacy    Frequency of need for help with medical instructions: Never   Family History:  Family History  Problem Relation Age of Onset   Diabetes Father    Heart disease Father    Angina Father    Dementia Mother     Review of Systems: Constitutional: Doesn't report fevers, chills or abnormal weight loss Eyes: Doesn't report blurriness of vision Ears, nose, mouth, throat, and face: Doesn't report sore throat Respiratory: Doesn't report cough, dyspnea or wheezes Cardiovascular: Doesn't report palpitation, chest discomfort  Gastrointestinal:  Doesn't report nausea, constipation, diarrhea GU: Doesn't report incontinence Skin: Doesn't report skin rashes Neurological: Per HPI Musculoskeletal: Doesn't report joint pain Behavioral/Psych: Doesn't report anxiety  Physical Exam: Vitals:   04/11/24 1131  BP: (!) 119/55  Pulse: 62  Resp: 20  Temp: 97.7 F (36.5 C)  SpO2: 96%     KPS: 90. General: Alert, cooperative, pleasant, in no acute distress Head: Normal EENT: No conjunctival injection or scleral icterus.  Lungs: Resp effort normal Cardiac: Regular rate Abdomen: Non-distended abdomen Skin: No rashes cyanosis or petechiae. Extremities: No clubbing or edema  Neurologic Exam: Mental Status: Awake, alert, attentive to examiner. Oriented to self and environment. Language is fluent with intact comprehension.  Cranial Nerves: Visual acuity is grossly normal. Visual fields are full. Extra-ocular movements intact. No ptosis. Face is symmetric Motor: Tone and bulk are normal. Power is full in both arms and legs. Reflexes are symmetric, no pathologic reflexes present.  Sensory: Intact to light touch Gait: Normal.   Labs: I have reviewed the data as listed    Component Value Date/Time    NA 141 04/11/2024 1109   NA 137 07/16/2017 0000   K 3.7 04/11/2024 1109   CL 101 04/11/2024 1109   CO2 27 04/11/2024 1109   GLUCOSE 201 (H) 04/11/2024 1109   BUN 14 04/11/2024 1109   BUN 16 07/16/2017 0000   CREATININE 0.84 04/11/2024 1109   CREATININE 0.72 02/24/2016 0806   CALCIUM 9.1 04/11/2024 1109   PROT 7.1 04/11/2024 1109   PROT 6.8 08/19/2023 0940   ALBUMIN 4.1 04/11/2024 1109   AST 14 (L) 04/11/2024 1109   ALT 12 04/11/2024 1109   ALKPHOS 85 04/11/2024 1109   BILITOT 0.7 04/11/2024 1109   GFRNONAA >60 04/11/2024 1109   GFRAA >90 07/29/2011 1120   Lab Results  Component Value Date   WBC 4.7 04/11/2024   NEUTROABS 2.9 04/11/2024   HGB 12.8 (L) 04/11/2024   HCT 37.1 (L) 04/11/2024   MCV 80.5 04/11/2024   PLT 176 04/11/2024     Assessment/Plan Glioblastoma, IDH-wildtype (HCC)  KENTRAVIOUS LIPFORD is clinically stable today, now having completed cycle #3 of adjuvant 5-day Temodar .  Lower 150mg /m2 dose had been better tolerated.  Labs remain within normal limits.  We recommended continuing treatment with cycle #4 Temozolomide , still at 150mg /m2, on for five days and off for twenty three days in twenty eight day cycles. The patient will have a complete blood count performed on days 21 and 28 of each cycle, and a comprehensive metabolic panel performed on day 28 of each cycle. Labs may need to be performed more often. Zofran  will prescribed for home use for nausea/vomiting.   Will  dose dexamethasone  4mg  daily x5 days during last 2 days of chemo and following 3 days, given timing of fatigue.  Informed consent was obtained verbally at bedside to proceed with oral chemotherapy.  Chemotherapy should be held for the following:  ANC less than 1,000  Platelets less than 100,000  LFT or creatinine greater than 2x ULN  If clinical concerns/contraindications develop  We ask that TARRIS DELBENE return to clinic in 1 months with MRI brain for evaluation prior to cycle  #5, or sooner as needed.  All questions were answered. The patient knows to call the clinic with any problems, questions or concerns. No barriers to learning were detected.  The total time spent in the encounter was 30 minutes and more than 50% was on counseling and review of test results   Arthea MARLA Manns, MD Medical Director of Neuro-Oncology [facility] [place] 04/11/24 11:44 AM "

## 2024-04-12 ENCOUNTER — Telehealth: Payer: Self-pay | Admitting: Internal Medicine

## 2024-04-12 NOTE — Telephone Encounter (Signed)
 Scheduled patient for next appointment. Called and spoke with the patients wife, she is aware.

## 2024-05-05 ENCOUNTER — Other Ambulatory Visit

## 2024-05-08 ENCOUNTER — Inpatient Hospital Stay: Admitting: Internal Medicine

## 2024-05-08 ENCOUNTER — Inpatient Hospital Stay: Attending: Radiation Oncology

## 2024-08-08 ENCOUNTER — Ambulatory Visit (INDEPENDENT_AMBULATORY_CARE_PROVIDER_SITE_OTHER): Admitting: Otolaryngology

## 2024-08-18 ENCOUNTER — Ambulatory Visit: Admitting: Student in an Organized Health Care Education/Training Program
